# Patient Record
Sex: Female | Born: 1985
Health system: Southern US, Community
[De-identification: ages and names within clinical notes are randomized; demographics above are authoritative.]

## PROBLEM LIST (undated history)

## (undated) DIAGNOSIS — I517 Cardiomegaly: Secondary | ICD-10-CM

## (undated) DIAGNOSIS — I10 Essential (primary) hypertension: Secondary | ICD-10-CM

## (undated) DIAGNOSIS — G8929 Other chronic pain: Secondary | ICD-10-CM

## (undated) DIAGNOSIS — I509 Heart failure, unspecified: Secondary | ICD-10-CM

## (undated) DIAGNOSIS — E282 Polycystic ovarian syndrome: Secondary | ICD-10-CM

## (undated) DIAGNOSIS — N183 Chronic kidney disease, stage 3 unspecified: Secondary | ICD-10-CM

## (undated) DIAGNOSIS — I4891 Unspecified atrial fibrillation: Secondary | ICD-10-CM

## (undated) DIAGNOSIS — E119 Type 2 diabetes mellitus without complications: Secondary | ICD-10-CM

## (undated) DIAGNOSIS — D571 Sickle-cell disease without crisis: Secondary | ICD-10-CM

## (undated) DIAGNOSIS — K769 Liver disease, unspecified: Secondary | ICD-10-CM

## (undated) DIAGNOSIS — G4730 Sleep apnea, unspecified: Secondary | ICD-10-CM

## (undated) DIAGNOSIS — U071 COVID-19: Secondary | ICD-10-CM

## (undated) DIAGNOSIS — I639 Cerebral infarction, unspecified: Secondary | ICD-10-CM

## (undated) DIAGNOSIS — N200 Calculus of kidney: Secondary | ICD-10-CM

## (undated) HISTORY — PX: PORTACATH PLACEMENT: SHX2246

## (undated) HISTORY — PX: CHOLECYSTECTOMY: SHX55

## (undated) HISTORY — PX: PARATHYROIDECTOMY: SHX19

---
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
## 2007-03-27 ENCOUNTER — Ambulatory Visit (HOSPITAL_COMMUNITY): Admission: RE | Admit: 2007-03-27 | Discharge: 2007-03-27 | Payer: Self-pay | Admitting: Obstetrics & Gynecology

## 2007-03-30 ENCOUNTER — Inpatient Hospital Stay (HOSPITAL_COMMUNITY): Admission: AD | Admit: 2007-03-30 | Discharge: 2007-04-05 | Payer: Self-pay | Admitting: Obstetrics

## 2009-12-26 ENCOUNTER — Ambulatory Visit (HOSPITAL_BASED_OUTPATIENT_CLINIC_OR_DEPARTMENT_OTHER): Admission: RE | Admit: 2009-12-26 | Discharge: 2009-12-26 | Payer: Self-pay | Source: Home / Self Care

## 2010-01-03 ENCOUNTER — Ambulatory Visit: Payer: Self-pay | Admitting: Internal Medicine

## 2010-04-05 ENCOUNTER — Encounter: Payer: Self-pay | Admitting: Obstetrics & Gynecology

## 2010-07-28 NOTE — Discharge Summary (Signed)
NAME:  Pamela Hudson, Pamela Hudson NO.:  [id number]   MEDICAL RECORD NO.:  [id number]          PATIENT TYPE:  INP   LOCATION:  9310                          FACILITY:  [facility]   PHYSICIAN:  Charles A. Clearance Coots, M.D.DATE OF BIRTH:  Dec 22, 1985   DATE OF ADMISSION:  03/30/2007  DATE OF DISCHARGE:  04/05/2007                               DISCHARGE SUMMARY   ADMITTING DIAGNOSES:  1. Thirty-eight weeks' gestation.  2. Sickle cell disease.  3. Induction of labor.   DISCHARGE DIAGNOSES:  1. Thirty-eight weeks' gestation.  2. Sickle cell disease.  3. Induction of labor.  4. Status post normal spontaneous vaginal delivery of a viable female      over intact perineum at 2312 on March 31, 2007.  Apgars were 8 at 1 minute and 9 at 5 minutes, weight of 2645 grams.  1. Postpartum hemorrhage from uterine atony, resolved with uterine      massage and medical therapy with Hemabate and intravenous Pitocin.  2. Postpartum left lower lobe lung infiltrate with decreased oxygen      saturation, much improved after intravenous antibiotic therapy.   CONDITION ON DISCHARGE:  Mother and infant discharged home in good  condition on postpartum day #5.   REASON FOR ADMISSION:  A 25 year old G1, estimated date of confinement  of April 13, 2007, history of sickle cell disease, status post CVA in  2004.  The patient has been receiving monthly transfusions at [facility] Hematology Department and has been stable during pregnancy.   PAST MEDICAL HISTORY:   SURGERY:  None.   ILLNESSES:  Sickle cell disease.   MEDICATIONS:  Prenatal vitamins.   ALLERGIES:  NO KNOWN DRUG ALLERGIES   SOCIAL HISTORY:  Single.  Negative tobacco, alcohol or recreational drug  use.   PHYSICAL EXAM:  A well-nourished, well-developed female in no acute  distress.  She was afebrile.  Vital signs were stable.  Lungs were clear  to auscultation bilaterally.  Heart:  Regular rate and rhythm.  Cervix  was fingertip, 80%  effaced and the vertex at a -2 station.   ADMITTING LABORATORY VALUES:  Hemoglobin 9.8, hematocrit 27, white blood  cell count 29,000, platelets 364,000.   HOSPITAL COURSE:  The patient was admitted and cervical ripening was  accomplished overnight followed by Pitocin augmentation of labor.  The  patient progressed to a normal spontaneous vaginal delivery with viable  female on March 31, 2007 at 2312.  The delivery of the placenta was  complicated by uterine atony following delivery of the placenta which  resulted in approximately 1000 mL of blood loss, but responded well to  uterine massage and IM Hemabate along with IV Pitocin.  The patient's  postpartum course was complicated by decreased O2 saturations and chest  x-ray which revealed a left lower lobe lung infiltrate.  continued to  have, a heart rate between 100 and 120 postpartum, but clinically was  very stable.  Hemoglobin had dropped to 9 on postpartum day #1 after  equilibration with the patient receiving 2 units of packed red blood  cells immediately post delivery.  A spiral CT was done to rule out  pulmonary embolism and the spiral CT was negative for pulmonary  embolism.  On postpartum day #2, the patient's hemoglobin was 8 and she  continued to have increased heart rate of 100-120, but she made  excellent progress the remainder of the postpartum course after being  started on antibiotic therapy with Avelox for pulmonary infiltrates.  The patient continued to make good progress postpartum and the O2  saturations on room air after ambulation were in the 98% range.  The  patient was therefore discharged home on postpartum day #5 in good  condition.   DISCHARGE DISPOSITION:   MEDICATIONS:  Continue with Ceftin 500 mg twice a day for 7 days for the  resolution of pulmonary infiltrates.   FOLLOWUP:  The patient has an appointment for followup evaluation at  [facility] with Dr. Frutoso Chase group and she will follow up in  the  office in 2 weeks for a postpartum visit.      Charles A. Clearance Coots, M.D.  Electronically Signed     CAH/MEDQ  D:  04/05/2007  T:  04/05/2007  Job:  [id number]   cc:   [facility] Hematology Oretha Milch MD   [facility] Hematology Ellwood Handler, Lorel Monaco MD   [facility], [place], [place] Particia Nearing MD

## 2010-12-03 LAB — DIFFERENTIAL
Band Neutrophils: 4
Band Neutrophils: 9
Basophils Absolute: 0
Basophils Relative: 0
Blasts: 0
Blasts: 0
Eosinophils Absolute: 0.4
Eosinophils Relative: 0
Lymphocytes Relative: 8 — ABNORMAL LOW
Lymphs Abs: 4.3 — ABNORMAL HIGH
Metamyelocytes Relative: 0
Myelocytes: 0
Neutro Abs: 31.2 — ABNORMAL HIGH
Neutrophils Relative %: 78 — ABNORMAL HIGH
Promyelocytes Absolute: 0
nRBC: 6 — ABNORMAL HIGH

## 2010-12-03 LAB — TYPE AND SCREEN: Antibody Screen: NEGATIVE

## 2010-12-03 LAB — CBC
HCT: 22.9 — ABNORMAL LOW
HCT: 27.3 — ABNORMAL LOW
MCHC: 35
MCHC: 35.1
MCHC: 36
MCHC: 36
MCV: 96.6
MCV: 97.9
MCV: 98.6
MCV: 98.7
Platelets: 237
Platelets: 244
Platelets: 356
Platelets: 364
RDW: 20.1 — ABNORMAL HIGH
RDW: 22.2 — ABNORMAL HIGH
RDW: 22.2 — ABNORMAL HIGH
RDW: 26.6 — ABNORMAL HIGH
WBC: 25.1 — ABNORMAL HIGH

## 2010-12-03 LAB — URINALYSIS, ROUTINE W REFLEX MICROSCOPIC
Bilirubin Urine: NEGATIVE
Ketones, ur: NEGATIVE
Nitrite: NEGATIVE
Protein, ur: NEGATIVE
Specific Gravity, Urine: 1.01
Urobilinogen, UA: 0.2

## 2010-12-03 LAB — HEPATITIS B SURFACE ANTIGEN: Hepatitis B Surface Ag: NEGATIVE

## 2010-12-03 LAB — RPR: RPR Ser Ql: NONREACTIVE

## 2010-12-03 LAB — COMPREHENSIVE METABOLIC PANEL
AST: 58 — ABNORMAL HIGH
CO2: 24
Calcium: 9.1
Creatinine, Ser: 0.92
GFR calc Af Amer: >60
GFR calc non Af Amer: >60

## 2010-12-03 LAB — RETICULOCYTES
RBC.: 2.7 — ABNORMAL LOW
Retic Count, Absolute: 256.5 — ABNORMAL HIGH

## 2010-12-03 LAB — LACTATE DEHYDROGENASE: LDH: 465 — ABNORMAL HIGH

## 2010-12-20 ENCOUNTER — Emergency Department (HOSPITAL_BASED_OUTPATIENT_CLINIC_OR_DEPARTMENT_OTHER)
Admission: EM | Admit: 2010-12-20 | Discharge: 2010-12-20 | Disposition: A | Discharge status: Home / Self Care | Payer: Medicare Other | Attending: Emergency Medicine | Admitting: Emergency Medicine

## 2010-12-20 ENCOUNTER — Encounter: Payer: Self-pay | Admitting: *Deleted

## 2010-12-20 DIAGNOSIS — T63461A Toxic effect of venom of wasps, accidental (unintentional), initial encounter: Secondary | ICD-10-CM | POA: Insufficient documentation

## 2010-12-20 DIAGNOSIS — T63441A Toxic effect of venom of bees, accidental (unintentional), initial encounter: Secondary | ICD-10-CM

## 2010-12-20 DIAGNOSIS — T6391XA Toxic effect of contact with unspecified venomous animal, accidental (unintentional), initial encounter: Secondary | ICD-10-CM | POA: Insufficient documentation

## 2010-12-20 HISTORY — DX: Sickle-cell disease without crisis: D57.1

## 2010-12-20 MED ORDER — CEPHALEXIN 500 MG PO CAPS: 500.0000 mg | ORAL_CAPSULE | Freq: Four times a day (QID) | ORAL | Status: AC

## 2010-12-20 NOTE — ED Notes (Signed)
Pt states she was stung on her left breast by a bee on Friday. Now area is red, tender to touch, swollen and itching.

## 2010-12-20 NOTE — ED Provider Notes (Signed)
Scribed for Geoffery Lyons, MD, the patient was seen in room MH02/MH02 . This chart was scribed by Ellie Lunch. This patient's care was started at 7:42 PM.   CSN: [id number] Arrival date & time: 12/20/2010  7:16 PM  Chief Complaint  Patient presents with  . Insect Bite    (Consider location/radiation/quality/duration/timing/severity/associated sxs/prior treatment) HPI Pamela Hudson is a 25 y.o. female who presents to the Emergency Department complaining of bee sting on her left breast on Friday, two days ago. Pt was in a pumpkin patch when a bee flew down her shirt and stung her. Reports no known allergy to bee stings. Reports she put tobacco on bee sting, hydrocortizone cream, and benadry and reports little improvement. Reports sting is still red, painful, itchy and swollen.   Past Medical History  Diagnosis Date  . Sickle cell anemia     Past Surgical History  Procedure Date  . Cholecystectomy     History reviewed. No pertinent family history.  History  Substance Use Topics  . Smoking status: Never Smoker   . Smokeless tobacco: Not on file  . Alcohol Use: Yes   Review of Systems  Skin:       Red, swelling insect bite on left breast  All other systems reviewed and are negative.   Allergies  Review of patient's allergies indicates no known allergies.  Home Medications   Current Outpatient Rx  Name Route Sig Dispense Refill  . ASPIRIN 81 MG PO TABS Oral Take 81 mg by mouth daily.      . DEFERASIROX 500 MG PO TBSO Oral Take 1,500 mg by mouth daily.      Marland Kitchen FOLIC ACID 400 MCG PO TABS Oral Take 400 mcg by mouth daily.      Marland Kitchen HYDROXYUREA 500 MG PO CAPS Oral Take 1,500 mg by mouth every other day. May take with food to minimize GI side effects.     Marland Kitchen HYDROXYUREA 500 MG PO CAPS Oral Take 2,000 mg by mouth every other day. May take with food to minimize GI side effects.     . MEDROXYPROGESTERONE ACETATE 150 MG/ML IM SUSP Intramuscular Inject 150 mg into the muscle every 3  (three) months.        BP 133/48  Pulse 114  Temp(Src) 99.1 F (37.3 C) (Oral)  Resp 20  Ht 5' (1.524 m)  Wt 224 lb (101.606 kg)  BMI 43.75 kg/m2  SpO2 97%  Physical Exam  Nursing note and vitals reviewed. Constitutional: She is oriented to person, place, and time. She appears well-developed and well-nourished.  HENT:  Head: Normocephalic.  Eyes: EOM are normal.  Neck: Normal range of motion.  Pulmonary/Chest: Effort normal.  Musculoskeletal: Normal range of motion.  Neurological: She is alert and oriented to person, place, and time.  Skin:       2-3 cm round erythematous, warm, and firm on medial left breast  Psychiatric: She has a normal mood and affect.   Procedures (including critical care time)  DIAGNOSTIC STUDIES: Oxygen Saturation is 97% on room air, normal by my interpretation.    ED COURSE / COORDINATION OF CARE: 19:45 EDP discussed possible infection. Plan to treat with abx. Continue taking benadryl.   MDM: allergic rxn vs. Cellulitis.    DISCHARGE MEDICATIONS: New Prescriptions   CEPHALEXIN (KEFLEX) 500 MG CAPSULE    Take 1 capsule (500 mg total) by mouth 4 (four) times daily.    SCRIBE ATTESTATION: I personally performed the services described in this documentation,  which was scribed in my presence. The recorded information has been reviewed and considered.             Geoffery Lyons, MD 12/20/10 2053

## 2012-02-13 DIAGNOSIS — I639 Cerebral infarction, unspecified: Secondary | ICD-10-CM | POA: Diagnosis present

## 2012-05-08 ENCOUNTER — Encounter (HOSPITAL_BASED_OUTPATIENT_CLINIC_OR_DEPARTMENT_OTHER): Payer: Self-pay | Admitting: *Deleted

## 2012-05-08 ENCOUNTER — Emergency Department (HOSPITAL_BASED_OUTPATIENT_CLINIC_OR_DEPARTMENT_OTHER)
Admission: EM | Admit: 2012-05-08 | Discharge: 2012-05-08 | Disposition: A | Discharge status: Home / Self Care | Payer: Medicare Other | Attending: Emergency Medicine | Admitting: Emergency Medicine

## 2012-05-08 DIAGNOSIS — J3489 Other specified disorders of nose and nasal sinuses: Secondary | ICD-10-CM | POA: Insufficient documentation

## 2012-05-08 DIAGNOSIS — Z79899 Other long term (current) drug therapy: Secondary | ICD-10-CM | POA: Insufficient documentation

## 2012-05-08 DIAGNOSIS — H9209 Otalgia, unspecified ear: Secondary | ICD-10-CM | POA: Insufficient documentation

## 2012-05-08 DIAGNOSIS — R1310 Dysphagia, unspecified: Secondary | ICD-10-CM | POA: Insufficient documentation

## 2012-05-08 DIAGNOSIS — Z7982 Long term (current) use of aspirin: Secondary | ICD-10-CM | POA: Insufficient documentation

## 2012-05-08 DIAGNOSIS — R6883 Chills (without fever): Secondary | ICD-10-CM | POA: Insufficient documentation

## 2012-05-08 DIAGNOSIS — J029 Acute pharyngitis, unspecified: Secondary | ICD-10-CM | POA: Insufficient documentation

## 2012-05-08 DIAGNOSIS — J020 Streptococcal pharyngitis: Secondary | ICD-10-CM

## 2012-05-08 MED ORDER — IBUPROFEN 100 MG/5ML PO SUSP
600.0000 mg | Freq: Once | ORAL | Status: AC
  Administered 2012-05-08: 600 mg via ORAL
  Filled 2012-05-08: qty 30

## 2012-05-08 MED ORDER — IBUPROFEN 600 MG PO TABS: 600.0000 mg | ORAL_TABLET | Freq: Four times a day (QID) | ORAL | Status: DC | PRN

## 2012-05-08 MED ORDER — AMOXICILLIN-POT CLAVULANATE 500-125 MG PO TABS: 1.0000 | ORAL_TABLET | Freq: Three times a day (TID) | ORAL | Status: DC

## 2012-05-08 NOTE — ED Notes (Signed)
Pt treated for strep throat on 2/11- states sx are no better

## 2012-05-08 NOTE — ED Provider Notes (Addendum)
History    This chart was scribed for Derwood Kaplan, MD by Donne Anon, ED Scribe. This patient was seen in room MH05/MH05 and the patient's care was started at 2117.   CSN: [id number]  Arrival date & time 05/08/12  2020   First MD Initiated Contact with Patient 05/08/12 2117      Chief Complaint  Patient presents with  . Sore Throat     The history is provided by the patient. No language interpreter was used.   Pamela Hudson is a 27 y.o. female who presents to the Emergency Department complaining of gradual onset, constant, moderate, unchanging moderate sore throat which began 13 days ago. She was seen by her PCP 12 days ago when she was given a shot of an antibiotic, a shot of a steroid, and a prescription for penicillin. She reports associated pain with swallowing, congestion, right ear pain, and chills. She denies nausea, vomiting, fever, drooling or any other pain.  H/o sickle cell anemia.   Past Medical History  Diagnosis Date  . Sickle cell anemia     Past Surgical History  Procedure Laterality Date  . Cholecystectomy    . Portacath placement      No family history on file.  History  Substance Use Topics  . Smoking status: Never Smoker   . Smokeless tobacco: Never Used  . Alcohol Use: Yes     Comment: occasion    Review of Systems  Constitutional: Negative for fever.  HENT: Positive for ear pain, congestion, sore throat and trouble swallowing. Negative for drooling.   Gastrointestinal: Negative for nausea and vomiting.  All other systems reviewed and are negative.    Allergies  Review of patient's allergies indicates no known allergies.  Home Medications   Current Outpatient Rx  Name  Route  Sig  Dispense  Refill  . aspirin 81 MG tablet   Oral   Take 81 mg by mouth daily.           Marland Kitchen deferasirox (EXJADE) 500 MG disintegrating tablet   Oral   Take 1,500 mg by mouth daily.           . folic acid (FOLVITE) 400 MCG tablet   Oral   Take 400  mcg by mouth daily.           . hydroxyurea (HYDREA) 500 MG capsule   Oral   Take 1,500 mg by mouth every other day. May take with food to minimize GI side effects.          . hydroxyurea (HYDREA) 500 MG capsule   Oral   Take 2,000 mg by mouth every other day. May take with food to minimize GI side effects.          . medroxyPROGESTERone (DEPO-PROVERA) 150 MG/ML injection   Intramuscular   Inject 150 mg into the muscle every 3 (three) months.             BP 130/74  Pulse 110  Temp(Src) 99 F (37.2 C) (Oral)  Resp 20  SpO2 100%  Physical Exam  Nursing note and vitals reviewed. Constitutional: She is oriented to person, place, and time. She appears well-developed and well-nourished. No distress.  HENT:  Head: Normocephalic and atraumatic.  Right Ear: External ear normal.  Left Ear: External ear normal.  Mouth/Throat: No oropharyngeal exudate.  No dullness appreciated. No preauricular lymphadenothapy. No postauricular lymphadenopathy. Bilateral tonsillar enlargement with erythema. Anterior cervical lymphadenopathy. Negative trismus.  Eyes: EOM are normal.  Neck: Neck supple. No tracheal deviation present.  Cardiovascular: Normal rate.   Pulmonary/Chest: Effort normal. No respiratory distress.  Musculoskeletal: Normal range of motion.  Neurological: She is alert and oriented to person, place, and time.  Skin: Skin is warm and dry.  Psychiatric: She has a normal mood and affect. Her behavior is normal.    ED Course  Procedures (including critical care time) DIAGNOSTIC STUDIES: Oxygen Saturation is 100% on room air, normal by my interpretation.    COORDINATION OF CARE: 9:35 PM Discussed treatment plan which includes strep test with pt at bedside and pt agreed to plan.     Labs Reviewed - No data to display No results found.   No diagnosis found.    MDM  I personally performed the services described in this documentation, which was scribed in my presence.  The recorded information has been reviewed and is accurate.  Pt comes in with cc of sore thorat. She is s/p decadron, penicillin po course - and the sore throat has persisted. She was dx with strep pharyngitis on 10th Feb. She has no splenomegaly. No malaise, just sore throat. She has no resp compromise and no trismus - no concerns for deep infection of the neck at this time.   If the rapid strep is positive - will treat with augmentin - as she likely has a resistant strain to simple peniciliin.  Derwood Kaplan, MD 05/08/12 2159  Derwood Kaplan, MD 05/08/12 2221

## 2013-01-10 ENCOUNTER — Encounter (HOSPITAL_BASED_OUTPATIENT_CLINIC_OR_DEPARTMENT_OTHER): Payer: Self-pay | Admitting: Emergency Medicine

## 2013-01-10 ENCOUNTER — Emergency Department (HOSPITAL_BASED_OUTPATIENT_CLINIC_OR_DEPARTMENT_OTHER): Payer: Medicare Other

## 2013-01-10 ENCOUNTER — Telehealth (HOSPITAL_BASED_OUTPATIENT_CLINIC_OR_DEPARTMENT_OTHER): Payer: Self-pay | Admitting: *Deleted

## 2013-01-10 ENCOUNTER — Emergency Department (HOSPITAL_BASED_OUTPATIENT_CLINIC_OR_DEPARTMENT_OTHER)
Admission: EM | Admit: 2013-01-10 | Discharge: 2013-01-10 | Disposition: A | Discharge status: Home / Self Care | Payer: Medicare Other | Attending: Emergency Medicine | Admitting: Emergency Medicine

## 2013-01-10 DIAGNOSIS — D571 Sickle-cell disease without crisis: Secondary | ICD-10-CM | POA: Insufficient documentation

## 2013-01-10 DIAGNOSIS — Z792 Long term (current) use of antibiotics: Secondary | ICD-10-CM | POA: Insufficient documentation

## 2013-01-10 DIAGNOSIS — Z79899 Other long term (current) drug therapy: Secondary | ICD-10-CM | POA: Insufficient documentation

## 2013-01-10 DIAGNOSIS — B349 Viral infection, unspecified: Secondary | ICD-10-CM

## 2013-01-10 DIAGNOSIS — R059 Cough, unspecified: Secondary | ICD-10-CM | POA: Insufficient documentation

## 2013-01-10 DIAGNOSIS — B9789 Other viral agents as the cause of diseases classified elsewhere: Secondary | ICD-10-CM | POA: Insufficient documentation

## 2013-01-10 DIAGNOSIS — R05 Cough: Secondary | ICD-10-CM | POA: Insufficient documentation

## 2013-01-10 DIAGNOSIS — Z7982 Long term (current) use of aspirin: Secondary | ICD-10-CM | POA: Insufficient documentation

## 2013-01-10 MED ORDER — HYDROCODONE-HOMATROPINE 5-1.5 MG/5ML PO SYRP: 5.0000 mL | ORAL_SOLUTION | Freq: Four times a day (QID) | ORAL | Status: DC | PRN

## 2013-01-10 MED ORDER — CETIRIZINE-PSEUDOEPHEDRINE ER 5-120 MG PO TB12: 1.0000 | ORAL_TABLET | Freq: Two times a day (BID) | ORAL | Status: DC

## 2013-01-10 NOTE — ED Provider Notes (Signed)
CSN: [id number]     Arrival date & time 01/10/13  2014 History   First MD Initiated Contact with Patient 01/10/13 2031     Chief Complaint  Patient presents with  . Sore Throat   (Consider location/radiation/quality/duration/timing/severity/associated sxs/prior Treatment) HPI Comments: Pt states that she was put on antibiotics and a steroid shot  4 weeks ago for similar symptoms  Patient is a 27 y.o. female presenting with pharyngitis. The history is provided by the patient. No language interpreter was used.  Sore Throat This is a new problem. The current episode started in the past 7 days. The problem occurs constantly. The problem has been unchanged. Associated symptoms include coughing. Pertinent negatives include no fever. The symptoms are aggravated by swallowing.    Past Medical History  Diagnosis Date  . Sickle cell anemia    Past Surgical History  Procedure Laterality Date  . Cholecystectomy    . Portacath placement     History reviewed. No pertinent family history. History  Substance Use Topics  . Smoking status: Never Smoker   . Smokeless tobacco: Never Used  . Alcohol Use: Yes     Comment: occasion   OB History   Grav Para Term Preterm Abortions TAB SAB Ect Mult Living                 Review of Systems  Constitutional: Negative for fever.  Respiratory: Positive for cough.   Cardiovascular: Negative.     Allergies  Review of patient's allergies indicates no known allergies.  Home Medications   Current Outpatient Rx  Name  Route  Sig  Dispense  Refill  . amoxicillin-clavulanate (AUGMENTIN) 500-125 MG per tablet   Oral   Take 1 tablet (500 mg total) by mouth every 8 (eight) hours.   30 tablet   0   . aspirin 81 MG tablet   Oral   Take 81 mg by mouth daily.           Marland Kitchen deferasirox (EXJADE) 500 MG disintegrating tablet   Oral   Take 1,500 mg by mouth daily.           . folic acid (FOLVITE) 400 MCG tablet   Oral   Take 400 mcg by mouth  daily.           . hydroxyurea (HYDREA) 500 MG capsule   Oral   Take 1,500 mg by mouth every other day. May take with food to minimize GI side effects.          . hydroxyurea (HYDREA) 500 MG capsule   Oral   Take 2,000 mg by mouth every other day. May take with food to minimize GI side effects.          Marland Kitchen ibuprofen (ADVIL,MOTRIN) 600 MG tablet   Oral   Take 1 tablet (600 mg total) by mouth every 6 (six) hours as needed for pain.   30 tablet   0   . medroxyPROGESTERone (DEPO-PROVERA) 150 MG/ML injection   Intramuscular   Inject 150 mg into the muscle every 3 (three) months.            BP 117/70  Pulse 100  Temp(Src) 98.8 F (37.1 C) (Oral)  Resp 16  Ht 5\' 1"  (1.549 m)  Wt 225 lb (102.059 kg)  BMI 42.54 kg/m2  SpO2 95% Physical Exam  Nursing note and vitals reviewed. Constitutional: She appears well-developed and well-nourished.  HENT:  Head: Normocephalic and atraumatic.  Right Ear: External ear  normal.  Left Ear: External ear normal.  Mouth/Throat: No posterior oropharyngeal erythema.  Post nasal drainage  Eyes: Conjunctivae and EOM are normal.  Neck: Normal range of motion. Neck supple.  Cardiovascular: Normal rate and regular rhythm.   Pulmonary/Chest: Effort normal and breath sounds normal.    ED Course  Procedures (including critical care time) Labs Review Labs Reviewed  RAPID STREP SCREEN  CULTURE, GROUP A STREP   Imaging Review Dg Chest 2 View  01/10/2013   CLINICAL DATA:  Cough, fever.  EXAM: CHEST  2 VIEW  COMPARISON:  April 07, 2007.  FINDINGS: The heart size and mediastinal contours are within normal limits. Right internal jugular Port-A-Cath is noted with tip in expected position of the SVC. No pneumothorax or pleural effusion is noted. Both lungs are clear. The visualized skeletal structures are unremarkable.  IMPRESSION: No active cardiopulmonary disease.   Electronically Signed   By: Roque Lias M.D.   On: 01/10/2013 21:04    EKG  Interpretation   None       MDM   1. Viral illness    No pneumonia (acute chest) or strep noted:pt has not had fever and is not in pain:will treat symptomatically    Teressa Lower, NP 01/10/13 2132  Teressa Lower, NP 01/10/13 2138

## 2013-01-10 NOTE — ED Provider Notes (Signed)
Medical screening examination/treatment/procedure(s) were performed by non-physician practitioner and as supervising physician I was immediately available for consultation/collaboration.  EKG Interpretation   None         Granvel Proudfoot, MD 01/10/13 2213 

## 2013-01-10 NOTE — ED Notes (Signed)
Patient transported to X-ray 

## 2013-01-10 NOTE — ED Notes (Signed)
rec'd call from [facility] stating that pt's insurance would not cover zyrtec-d only zyrtec. Per Dr Fredderick Phenix, okay to use zyrtec instead once a day with same quantity. Pt and pharmacy made aware.

## 2013-01-10 NOTE — ED Notes (Signed)
Pt c/o sore throat x 3 days, TX for sinus infection x 4 weeks ago

## 2013-01-11 LAB — RAPID STREP SCREEN (MED CTR MEBANE ONLY): Streptococcus, Group A Screen (Direct): NEGATIVE

## 2013-01-12 LAB — CULTURE, GROUP A STREP

## 2013-02-20 ENCOUNTER — Encounter (HOSPITAL_BASED_OUTPATIENT_CLINIC_OR_DEPARTMENT_OTHER): Payer: Self-pay | Admitting: Emergency Medicine

## 2013-02-20 ENCOUNTER — Emergency Department (HOSPITAL_BASED_OUTPATIENT_CLINIC_OR_DEPARTMENT_OTHER): Payer: Medicare Other

## 2013-02-20 ENCOUNTER — Emergency Department (HOSPITAL_BASED_OUTPATIENT_CLINIC_OR_DEPARTMENT_OTHER)
Admission: EM | Admit: 2013-02-20 | Discharge: 2013-02-20 | Disposition: A | Discharge status: Home / Self Care | Payer: Medicare Other | Attending: Emergency Medicine | Admitting: Emergency Medicine

## 2013-02-20 DIAGNOSIS — Z79899 Other long term (current) drug therapy: Secondary | ICD-10-CM | POA: Insufficient documentation

## 2013-02-20 DIAGNOSIS — Z7982 Long term (current) use of aspirin: Secondary | ICD-10-CM | POA: Insufficient documentation

## 2013-02-20 DIAGNOSIS — L509 Urticaria, unspecified: Secondary | ICD-10-CM | POA: Insufficient documentation

## 2013-02-20 DIAGNOSIS — D571 Sickle-cell disease without crisis: Secondary | ICD-10-CM | POA: Insufficient documentation

## 2013-02-20 DIAGNOSIS — M9261 Juvenile osteochondrosis of tarsus, right ankle: Secondary | ICD-10-CM

## 2013-02-20 DIAGNOSIS — M928 Other specified juvenile osteochondrosis: Secondary | ICD-10-CM | POA: Insufficient documentation

## 2013-02-20 MED ORDER — IBUPROFEN 800 MG PO TABS: 800.0000 mg | ORAL_TABLET | Freq: Three times a day (TID) | ORAL | Status: DC

## 2013-02-20 NOTE — ED Notes (Signed)
NP at bedside.

## 2013-02-20 NOTE — ED Notes (Signed)
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 Has had a raised area on her right heel for 5 days. It comes and goes and itches.

## 2013-02-20 NOTE — ED Provider Notes (Signed)
CSN: [id number]     Arrival date & time 02/20/13  1524 History   First MD Initiated Contact with Patient 02/20/13 1531     Chief Complaint  Patient presents with  . Urticaria   (Consider location/radiation/quality/duration/timing/severity/associated sxs/prior Treatment) HPI Comments: Pt states that she has 2 raised area on the back of her right heel that are firm and itchy and have been there for 5 days:pt states that she has been putting rubbing etoh and hydrocortisone to the area:no injury to the area:no drainage or redness noted:pt states that she developed hives on her chest this morning after doing a presentation at school:no sob  The history is provided by the patient. No language interpreter was used.    Past Medical History  Diagnosis Date  . Sickle cell anemia    Past Surgical History  Procedure Laterality Date  . Cholecystectomy    . Portacath placement     No family history on file. History  Substance Use Topics  . Smoking status: Never Smoker   . Smokeless tobacco: Never Used  . Alcohol Use: Yes     Comment: occasion   OB History   Grav Para Term Preterm Abortions TAB SAB Ect Mult Living                 Review of Systems  Constitutional: Negative.   Respiratory: Negative.   Cardiovascular: Negative.     Allergies  Review of patient's allergies indicates no known allergies.  Home Medications   Current Outpatient Rx  Name  Route  Sig  Dispense  Refill  . amoxicillin-clavulanate (AUGMENTIN) 500-125 MG per tablet   Oral   Take 1 tablet (500 mg total) by mouth every 8 (eight) hours.   30 tablet   0   . aspirin 81 MG tablet   Oral   Take 81 mg by mouth daily.           . cetirizine-pseudoephedrine (ZYRTEC-D) 5-120 MG per tablet   Oral   Take 1 tablet by mouth 2 (two) times daily.   30 tablet   0   . deferasirox (EXJADE) 500 MG disintegrating tablet   Oral   Take 1,500 mg by mouth daily.           . folic acid (FOLVITE) 400 MCG tablet  Oral   Take 400 mcg by mouth daily.           Marland Kitchen HYDROcodone-homatropine (HYDROMET) 5-1.5 MG/5ML syrup   Oral   Take 5 mLs by mouth every 6 (six) hours as needed for cough.   120 mL   0   . hydroxyurea (HYDREA) 500 MG capsule   Oral   Take 1,500 mg by mouth every other day. May take with food to minimize GI side effects.          . hydroxyurea (HYDREA) 500 MG capsule   Oral   Take 2,000 mg by mouth every other day. May take with food to minimize GI side effects.          Marland Kitchen ibuprofen (ADVIL,MOTRIN) 600 MG tablet   Oral   Take 1 tablet (600 mg total) by mouth every 6 (six) hours as needed for pain.   30 tablet   0   . medroxyPROGESTERone (DEPO-PROVERA) 150 MG/ML injection   Intramuscular   Inject 150 mg into the muscle every 3 (three) months.            BP 129/72  Pulse 95  Temp(Src) 98.3  F (36.8 C) (Oral)  Resp 20  Ht 5\' 1"  (1.549 m)  Wt 225 lb (102.059 kg)  BMI 42.54 kg/m2  SpO2 95% Physical Exam  Nursing note and vitals reviewed. Constitutional: She is oriented to person, place, and time. She appears well-developed and well-nourished.  Cardiovascular: Normal rate and regular rhythm.   Pulmonary/Chest: Effort normal and breath sounds normal.  Musculoskeletal: Normal range of motion.  Neurological: She is alert and oriented to person, place, and time.  Skin:  Pt has 2 small dime sized firm nodules to the posterior aspect of the right heel:no redness or swelling noted:Pt has localized area of rash to the right chest    ED Course  Procedures (including critical care time) Labs Review Labs Reviewed - No data to display Imaging Review Dg Ankle 2 Views Right  02/20/2013   CLINICAL DATA:  Soft tissue prominence posterior ankle region.  EXAM: RIGHT ANKLE - 2 VIEW  COMPARISON:  December 25, 2006  FINDINGS: Frontal and lateral views were obtained. There is calcification in the lateral malleolar region which may represent residua of old trauma. No evidence suggesting  acute fracture. No joint effusion. Ankle mortise appears intact. No erosive change. No soft tissue mass or calcification seen.  IMPRESSION: No demonstrable soft tissue mass. Question old trauma lateral malleolar region. No acute fracture. Mortise intact. No joint effusion.   Electronically Signed   By: Bretta Bang M.D.   On: 02/20/2013 15:56    EKG Interpretation   None       MDM   1. Hives   2. Haglund's deformity, right    Will treat for a tendonitis:hives are localized:pt can take otc medications    Teressa Lower, NP 02/20/13 1610

## 2013-02-20 NOTE — ED Provider Notes (Signed)
Medical screening examination/treatment/procedure(s) were performed by non-physician practitioner and as supervising physician I was immediately available for consultation/collaboration.     Geoffery Lyons, MD 02/20/13 [phone]

## 2013-05-12 ENCOUNTER — Encounter (HOSPITAL_BASED_OUTPATIENT_CLINIC_OR_DEPARTMENT_OTHER): Payer: Self-pay | Admitting: Emergency Medicine

## 2013-05-12 ENCOUNTER — Emergency Department (HOSPITAL_BASED_OUTPATIENT_CLINIC_OR_DEPARTMENT_OTHER)
Admission: EM | Admit: 2013-05-12 | Discharge: 2013-05-13 | Disposition: A | Discharge status: Home / Self Care | Payer: Medicare Other | Attending: Emergency Medicine | Admitting: Emergency Medicine

## 2013-05-12 ENCOUNTER — Emergency Department (HOSPITAL_BASED_OUTPATIENT_CLINIC_OR_DEPARTMENT_OTHER): Payer: Medicare Other

## 2013-05-12 DIAGNOSIS — N760 Acute vaginitis: Secondary | ICD-10-CM | POA: Insufficient documentation

## 2013-05-12 DIAGNOSIS — B9689 Other specified bacterial agents as the cause of diseases classified elsewhere: Secondary | ICD-10-CM | POA: Insufficient documentation

## 2013-05-12 DIAGNOSIS — Z9889 Other specified postprocedural states: Secondary | ICD-10-CM | POA: Insufficient documentation

## 2013-05-12 DIAGNOSIS — R109 Unspecified abdominal pain: Secondary | ICD-10-CM

## 2013-05-12 DIAGNOSIS — Z9089 Acquired absence of other organs: Secondary | ICD-10-CM | POA: Insufficient documentation

## 2013-05-12 DIAGNOSIS — D571 Sickle-cell disease without crisis: Secondary | ICD-10-CM | POA: Insufficient documentation

## 2013-05-12 DIAGNOSIS — Z792 Long term (current) use of antibiotics: Secondary | ICD-10-CM | POA: Insufficient documentation

## 2013-05-12 DIAGNOSIS — R197 Diarrhea, unspecified: Secondary | ICD-10-CM | POA: Insufficient documentation

## 2013-05-12 DIAGNOSIS — Z791 Long term (current) use of non-steroidal anti-inflammatories (NSAID): Secondary | ICD-10-CM | POA: Insufficient documentation

## 2013-05-12 DIAGNOSIS — Z79899 Other long term (current) drug therapy: Secondary | ICD-10-CM | POA: Insufficient documentation

## 2013-05-12 DIAGNOSIS — Z7982 Long term (current) use of aspirin: Secondary | ICD-10-CM | POA: Insufficient documentation

## 2013-05-12 DIAGNOSIS — A499 Bacterial infection, unspecified: Secondary | ICD-10-CM | POA: Insufficient documentation

## 2013-05-12 LAB — URINALYSIS, ROUTINE W REFLEX MICROSCOPIC
GLUCOSE, UA: NEGATIVE mg/dL
HGB URINE DIPSTICK: NEGATIVE
KETONES UR: NEGATIVE mg/dL
Nitrite: NEGATIVE
Protein, ur: NEGATIVE mg/dL
SPECIFIC GRAVITY, URINE: 1.016 (ref 1.005–1.030)
Urobilinogen, UA: 1 mg/dL (ref 0.0–1.0)
pH: 5.5 (ref 5.0–8.0)

## 2013-05-12 LAB — URINE MICROSCOPIC-ADD ON

## 2013-05-12 LAB — COMPREHENSIVE METABOLIC PANEL
ALT: 112 U/L — ABNORMAL HIGH (ref 0–35)
AST: 90 U/L — ABNORMAL HIGH (ref 0–37)
Albumin: 3.7 g/dL (ref 3.5–5.2)
Alkaline Phosphatase: 165 U/L — ABNORMAL HIGH (ref 39–117)
BILIRUBIN TOTAL: 9.3 mg/dL — AB (ref 0.3–1.2)
BUN: 9 mg/dL (ref 6–23)
CALCIUM: 11.1 mg/dL — AB (ref 8.4–10.5)
CHLORIDE: 102 meq/L (ref 96–112)
CO2: 23 meq/L (ref 19–32)
CREATININE: 0.6 mg/dL (ref 0.50–1.10)
GLUCOSE: 84 mg/dL (ref 70–99)
Potassium: 3.9 mEq/L (ref 3.7–5.3)
Sodium: 136 mEq/L — ABNORMAL LOW (ref 137–147)
Total Protein: 8.2 g/dL (ref 6.0–8.3)

## 2013-05-12 LAB — WET PREP, GENITAL
Trich, Wet Prep: NONE SEEN
YEAST WET PREP: NONE SEEN

## 2013-05-12 LAB — CBC WITH DIFFERENTIAL/PLATELET
BAND NEUTROPHILS: 1 % (ref 0–10)
Basophils Absolute: 0 10*3/uL (ref 0.0–0.1)
Basophils Relative: 0 % (ref 0–1)
EOS ABS: 0.3 10*3/uL (ref 0.0–0.7)
Eosinophils Relative: 1 % (ref 0–5)
HEMATOCRIT: 24 % — AB (ref 36.0–46.0)
Hemoglobin: 8.3 g/dL — ABNORMAL LOW (ref 12.0–15.0)
LYMPHS ABS: 12.7 10*3/uL — AB (ref 0.7–4.0)
LYMPHS PCT: 38 % (ref 12–46)
MCH: 34.4 pg — AB (ref 26.0–34.0)
MCHC: 34.6 g/dL (ref 30.0–36.0)
MCV: 99.6 fL (ref 78.0–100.0)
MONO ABS: 2.7 10*3/uL — AB (ref 0.1–1.0)
Monocytes Relative: 8 % (ref 3–12)
NEUTROS ABS: 17.6 10*3/uL — AB (ref 1.7–7.7)
Neutrophils Relative %: 52 % (ref 43–77)
Platelets: 367 10*3/uL (ref 150–400)
RBC: 2.41 MIL/uL — ABNORMAL LOW (ref 3.87–5.11)
RDW: 21 % — AB (ref 11.5–15.5)
WBC: 33.3 10*3/uL — ABNORMAL HIGH (ref 4.0–10.5)

## 2013-05-12 LAB — LIPASE, BLOOD: Lipase: 25 U/L (ref 11–59)

## 2013-05-12 NOTE — ED Notes (Signed)
Reports being on her period.  States cramping is excessive, in her diffuse lower abdomen, and c/o diarrhea along with it.  Denies fever.

## 2013-05-12 NOTE — ED Provider Notes (Signed)
CSN: [id number]     Arrival date & time 05/12/13  2039 History   First MD Initiated Contact with Patient 05/12/13 2044     Chief Complaint  Patient presents with  . Abdominal Pain     (Consider location/radiation/quality/duration/timing/severity/associated sxs/prior Treatment) Patient is a 28 y.o. female presenting with abdominal pain. The history is provided by the patient.  Abdominal Pain Pain location:  LLQ, RLQ and suprapubic Pain quality: dull and sharp   Pain radiates to:  Does not radiate Pain severity:  Mild Onset quality:  Gradual Timing:  Constant Progression:  Worsening Chronicity:  New Context: not sick contacts and not suspicious food intake   Relieved by:  Nothing Worsened by:  Nothing tried Ineffective treatments:  None tried Associated symptoms: diarrhea and vaginal bleeding   Associated symptoms: no anorexia, no chest pain, no chills, no cough, no dysuria, no fatigue, no fever, no flatus, no hematuria, no nausea, no shortness of breath, no sore throat, no vaginal discharge and no vomiting   Risk factors: not pregnant    Pamela Hudson is a 28 y.o. female who presents to the ED with lower abdominal pain that started 2 days ago and her period started yesterday. She reports that the pain is different from her usual cramps. She has not taken anything for pain. She uses Depo Provera for birth control. Current sex partner x 5 years, uses condoms most of the time. No history of STI's.   Past Medical History  Diagnosis Date  . Sickle cell anemia    Past Surgical History  Procedure Laterality Date  . Cholecystectomy    . Portacath placement     No family history on file. History  Substance Use Topics  . Smoking status: Never Smoker   . Smokeless tobacco: Never Used  . Alcohol Use: Yes     Comment: occasion   OB History   Grav Para Term Preterm Abortions TAB SAB Ect Mult Living                 Review of Systems  Constitutional: Negative for fever, chills and  fatigue.  HENT: Negative.  Negative for sore throat.   Eyes: Negative for pain and visual disturbance.  Respiratory: Negative for cough and shortness of breath.   Cardiovascular: Negative for chest pain.  Gastrointestinal: Positive for abdominal pain and diarrhea. Negative for nausea, vomiting, anorexia and flatus.  Genitourinary: Positive for vaginal bleeding. Negative for dysuria, hematuria and vaginal discharge.  Musculoskeletal: Negative for back pain.  Skin: Negative for rash.  Neurological: Negative for light-headedness and headaches.  Psychiatric/Behavioral: Negative for confusion. The patient is not nervous/anxious.       Allergies  Review of patient's allergies indicates no known allergies.  Home Medications   Current Outpatient Rx  Name  Route  Sig  Dispense  Refill  . amoxicillin-clavulanate (AUGMENTIN) 500-125 MG per tablet   Oral   Take 1 tablet (500 mg total) by mouth every 8 (eight) hours.   30 tablet   0   . aspirin 81 MG tablet   Oral   Take 81 mg by mouth daily.           . cetirizine-pseudoephedrine (ZYRTEC-D) 5-120 MG per tablet   Oral   Take 1 tablet by mouth 2 (two) times daily.   30 tablet   0   . deferasirox (EXJADE) 500 MG disintegrating tablet   Oral   Take 1,500 mg by mouth daily.           Marland Kitchen  folic acid (FOLVITE) 400 MCG tablet   Oral   Take 400 mcg by mouth daily.           Marland Kitchen HYDROcodone-homatropine (HYDROMET) 5-1.5 MG/5ML syrup   Oral   Take 5 mLs by mouth every 6 (six) hours as needed for cough.   120 mL   0   . hydroxyurea (HYDREA) 500 MG capsule   Oral   Take 1,500 mg by mouth every other day. May take with food to minimize GI side effects.          . hydroxyurea (HYDREA) 500 MG capsule   Oral   Take 2,000 mg by mouth every other day. May take with food to minimize GI side effects.          Marland Kitchen ibuprofen (ADVIL,MOTRIN) 600 MG tablet   Oral   Take 1 tablet (600 mg total) by mouth every 6 (six) hours as needed for  pain.   30 tablet   0   . ibuprofen (ADVIL,MOTRIN) 800 MG tablet   Oral   Take 1 tablet (800 mg total) by mouth 3 (three) times daily.   21 tablet   0   . medroxyPROGESTERone (DEPO-PROVERA) 150 MG/ML injection   Intramuscular   Inject 150 mg into the muscle every 3 (three) months.            BP 123/72  Pulse 90  Temp(Src) 99 F (37.2 C) (Oral)  Resp 16  Ht 5' (1.524 m)  Wt 225 lb (102.059 kg)  BMI 43.94 kg/m2  SpO2 95%  LMP 05/11/2013 Physical Exam  Nursing note and vitals reviewed. Constitutional: She is oriented to person, place, and time. She appears well-developed and well-nourished.  HENT:  Head: Normocephalic and atraumatic.  Eyes: EOM are normal.  Neck: Neck supple.  Cardiovascular: Normal rate.   Pulmonary/Chest: Effort normal.  Abdominal: Soft. Bowel sounds are normal. There is tenderness in the right lower quadrant, suprapubic area and left lower quadrant. There is no CVA tenderness.  Genitourinary:  External genitalia without lesions, moderate blood vaginal vault. Positive CMT, bilateral adnexal tenderness. Unable to palpate uterus due to patient habitus.  Musculoskeletal: Normal range of motion.  Neurological: She is alert and oriented to person, place, and time. No cranial nerve deficit.  Skin: Skin is warm and dry.  Psychiatric: She has a normal mood and affect. Her behavior is normal.   Results for orders placed during the hospital encounter of 05/12/13 (from the past 24 hour(s))  CBC WITH DIFFERENTIAL     Status: Abnormal   Collection Time    05/12/13  9:20 PM      Result Value Ref Range   WBC 33.3 (*) 4.0 - 10.5 K/uL   RBC 2.41 (*) 3.87 - 5.11 MIL/uL   Hemoglobin 8.3 (*) 12.0 - 15.0 g/dL   HCT 65.7 (*) 84.6 - 96.2 %   MCV 99.6  78.0 - 100.0 fL   MCH 34.4 (*) 26.0 - 34.0 pg   MCHC 34.6  30.0 - 36.0 g/dL   RDW 95.2 (*) 84.1 - 32.4 %   Platelets 367  150 - 400 K/uL   Neutrophils Relative % 52  43 - 77 %   Lymphocytes Relative 38  12 - 46 %    Monocytes Relative 8  3 - 12 %   Eosinophils Relative 1  0 - 5 %   Basophils Relative 0  0 - 1 %   Band Neutrophils 1  0 - 10 %  Neutro Abs 17.6 (*) 1.7 - 7.7 K/uL   Lymphs Abs 12.7 (*) 0.7 - 4.0 K/uL   Monocytes Absolute 2.7 (*) 0.1 - 1.0 K/uL   Eosinophils Absolute 0.3  0.0 - 0.7 K/uL   Basophils Absolute 0.0  0.0 - 0.1 K/uL   RBC Morphology SICKLE CELLS     WBC Morphology WHITE COUNT CONFIRMED ON SMEAR    COMPREHENSIVE METABOLIC PANEL     Status: Abnormal   Collection Time    05/12/13  9:20 PM      Result Value Ref Range   Sodium 136 (*) 137 - 147 mEq/L   Potassium 3.9  3.7 - 5.3 mEq/L   Chloride 102  96 - 112 mEq/L   CO2 23  19 - 32 mEq/L   Glucose, Bld 84  70 - 99 mg/dL   BUN 9  6 - 23 mg/dL   Creatinine, Ser 1.61  0.50 - 1.10 mg/dL   Calcium 09.6 (*) 8.4 - 10.5 mg/dL   Total Protein 8.2  6.0 - 8.3 g/dL   Albumin 3.7  3.5 - 5.2 g/dL   AST 90 (*) 0 - 37 U/L   ALT 112 (*) 0 - 35 U/L   Alkaline Phosphatase 165 (*) 39 - 117 U/L   Total Bilirubin 9.3 (*) 0.3 - 1.2 mg/dL   GFR calc non Af Amer >90  >90 mL/min   GFR calc Af Amer >90  >90 mL/min  LIPASE, BLOOD     Status: None   Collection Time    05/12/13  9:20 PM      Result Value Ref Range   Lipase 25  11 - 59 U/L  WET PREP, GENITAL     Status: Abnormal   Collection Time    05/12/13  9:40 PM      Result Value Ref Range   Yeast Wet Prep HPF POC NONE SEEN  NONE SEEN   Trich, Wet Prep NONE SEEN  NONE SEEN   Clue Cells Wet Prep HPF POC MANY (*) NONE SEEN   WBC, Wet Prep HPF POC FEW (*) NONE SEEN  URINALYSIS, ROUTINE W REFLEX MICROSCOPIC     Status: Abnormal   Collection Time    05/12/13  9:45 PM      Result Value Ref Range   Color, Urine ORANGE (*) YELLOW   APPearance CLEAR  CLEAR   Specific Gravity, Urine 1.016  1.005 - 1.030   pH 5.5  5.0 - 8.0   Glucose, UA NEGATIVE  NEGATIVE mg/dL   Hgb urine dipstick NEGATIVE  NEGATIVE   Bilirubin Urine SMALL (*) NEGATIVE   Ketones, ur NEGATIVE  NEGATIVE mg/dL   Protein, ur  NEGATIVE  NEGATIVE mg/dL   Urobilinogen, UA 1.0  0.0 - 1.0 mg/dL   Nitrite NEGATIVE  NEGATIVE   Leukocytes, UA TRACE (*) NEGATIVE  URINE MICROSCOPIC-ADD ON     Status: Abnormal   Collection Time    05/12/13  9:45 PM      Result Value Ref Range   Squamous Epithelial / LPF RARE  RARE   WBC, UA 0-2  <3 WBC/hpf   RBC / HPF 0-2  <3 RBC/hpf   Bacteria, UA FEW (*) RARE   Ct Abdomen Pelvis Wo Contrast  05/12/2013   CLINICAL DATA:  Lower abdominal pain, menstrual cramping, leukocytosis. Sickle cell disease. Prior cholecystectomy.  EXAM: CT ABDOMEN AND PELVIS WITHOUT CONTRAST  TECHNIQUE: Multidetector CT imaging of the abdomen and pelvis was performed following the standard protocol  without intravenous contrast.  COMPARISON:  CT chest abdomen pelvis dated 01/24/2013  FINDINGS: Lung bases are essentially clear.  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  Spleen, pancreas, and adrenal glands are within normal limits.  Status post cholecystectomy. No intrahepatic or extrahepatic ductal dilatation.  3 mm nonobstructing right lower pole renal calculus. Left kidney is within normal limits. No hydronephrosis.  No evidence of bowel obstruction.  Normal appendix.  No evidence of abdominal aortic aneurysm.  No abdominopelvic ascites.  No suspicious abdominopelvic lymphadenopathy.  Uterus and bilateral ovaries are unremarkable.  No ureteral or bladder calculi.  Bladder is unremarkable.  Calcified pelvic phleboliths.  Moderate fat containing left paramidline ventral hernia (series 2/image 56).  Visualized osseous structures are within normal limits.  IMPRESSION: No evidence of bowel obstruction.  Normal appendix.  3 mm nonobstructing right lower pole renal calculus. No ureteral or bladder calculi. No hydronephrosis.  Prior cholecystectomy.  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   Electronically Signed   By: Charline Bills M.D.   On: 05/12/2013  23:58    ED Course: Dr. Fayrene Fearing in to discuss lab results with the patient. The patient states that her WBC is always 30 thousand. She states she goes to [facility] for her sickle cell and they do profusion. She is scheduled to go next week.   Procedures   MDM  28 y.o. female with abdominal pain that is cramping but worse than her usual cramps with menses. Stable for discharge with soft, non surgical abdomen. Will treat her BV and pain. She will follow up as scheduled with her doctor at [facility]. She will return here as needed. I have reviewed this patient's vital signs, nurses notes, appropriate labs and imaging.  I have discussed findings and plan of care with the patient and she voices understanding.    Medication List    TAKE these medications       metroNIDAZOLE 500 MG tablet  Commonly known as:  FLAGYL  Take 1 tablet (500 mg total) by mouth 2 (two) times daily.     oxyCODONE-acetaminophen 5-325 MG per tablet  Commonly known as:  ROXICET  Take 1 tablet by mouth every 4 (four) hours as needed for severe pain.      ASK your doctor about these medications       amoxicillin-clavulanate 500-125 MG per tablet  Commonly known as:  AUGMENTIN  Take 1 tablet (500 mg total) by mouth every 8 (eight) hours.     aspirin 81 MG tablet  Take 81 mg by mouth daily.     cetirizine-pseudoephedrine 5-120 MG per tablet  Commonly known as:  ZYRTEC-D  Take 1 tablet by mouth 2 (two) times daily.     deferasirox 500 MG disintegrating tablet  Commonly known as:  EXJADE  Take 1,500 mg by mouth daily.     folic acid 400 MCG tablet  Commonly known as:  FOLVITE  Take 400 mcg by mouth daily.     HYDROcodone-homatropine 5-1.5 MG/5ML syrup  Commonly known as:  HYDROMET  Take 5 mLs by mouth every 6 (six) hours as needed for cough.     hydroxyurea 500 MG capsule  Commonly known as:  HYDREA  Take 1,500 mg by mouth every other day. May take with food to minimize GI side effects.     hydroxyurea 500 MG capsule   Commonly known as:  HYDREA  Take 2,000 mg by mouth every other day. May take with food to  minimize GI side effects.     ibuprofen 600 MG tablet  Commonly known as:  ADVIL,MOTRIN  Take 1 tablet (600 mg total) by mouth every 6 (six) hours as needed for pain.     ibuprofen 800 MG tablet  Commonly known as:  ADVIL,MOTRIN  Take 1 tablet (800 mg total) by mouth 3 (three) times daily.     medroxyPROGESTERone 150 MG/ML injection  Commonly known as:  DEPO-PROVERA  Inject 150 mg into the muscle every 3 (three) months.           [facility] Orlene OchM Isaiah Torok, [place]NP 05/13/13 [phone]

## 2013-05-12 NOTE — ED Provider Notes (Signed)
Pt seen and examined.  History reviewed.  Intermittant bilateral lower abdominal pain.  WBC 33K (consistent with pts WBC over several years).  Bili 9.3 and pt with icterus (pt states icterus over 5 years, and bilirubin "always" high.  H/o cholecystectomy.  H/o sickle cell, missed exchange tranfusion due to snow last week, scheduled Tuesday.  Abdomen is benign.  Aggressive palpation of lower abdomen ilicits NO tenderness.  CT reviewed, and without gross abnormalities, Radiologist report pending.  Plan:  If CT report NAD, I feel DC with follow up is appropriate.  Rolland PorterMark Aryonna Gunnerson, MD 05/12/13 [phone]

## 2013-05-13 LAB — RETICULOCYTES
RBC.: 2.42 MIL/uL — ABNORMAL LOW (ref 3.87–5.11)
RETIC CT PCT: 23 % — AB (ref 0.4–3.1)
Retic Count, Absolute: 556.6 10*3/uL — ABNORMAL HIGH (ref 19.0–186.0)

## 2013-05-13 MED ORDER — METRONIDAZOLE 500 MG PO TABS: 500.0000 mg | ORAL_TABLET | Freq: Two times a day (BID) | ORAL | Status: DC

## 2013-05-13 MED ORDER — OXYCODONE-ACETAMINOPHEN 5-325 MG PO TABS: 1.0000 | ORAL_TABLET | ORAL | Status: DC | PRN

## 2013-05-13 NOTE — Discharge Instructions (Signed)
It is important that you follow up with your doctor at [facility]. Take your lab results to your visit. Return here as needed for worsening symptoms.   Abdominal Pain, Women Abdominal (stomach, pelvic, or belly) pain can be caused by many things. It is important to tell your doctor:  The location of the pain.  Does it come and go or is it present all the time?  Are there things that start the pain (eating certain foods, exercise)?  Are there other symptoms associated with the pain (fever, nausea, vomiting, diarrhea)? All of this is helpful to know when trying to find the cause of the pain. CAUSES   Stomach: virus or bacteria infection, or ulcer.  Intestine: appendicitis (inflamed appendix), regional ileitis (Crohn's disease), ulcerative colitis (inflamed colon), irritable bowel syndrome, diverticulitis (inflamed diverticulum of the colon), or cancer of the stomach or intestine.  Gallbladder disease or stones in the gallbladder.  Kidney disease, kidney stones, or infection.  Pancreas infection or cancer.  Fibromyalgia (pain disorder).  Diseases of the female organs:  Uterus: fibroid (non-cancerous) tumors or infection.  Fallopian tubes: infection or tubal pregnancy.  Ovary: cysts or tumors.  Pelvic adhesions (scar tissue).  Endometriosis (uterus lining tissue growing in the pelvis and on the pelvic organs).  Pelvic congestion syndrome (female organs filling up with blood just before the menstrual period).  Pain with the menstrual period.  Pain with ovulation (producing an egg).  Pain with an IUD (intrauterine device, birth control) in the uterus.  Cancer of the female organs.  Functional pain (pain not caused by a disease, may improve without treatment).  Psychological pain.  Depression. DIAGNOSIS  Your doctor will decide the seriousness of your pain by doing an examination.  Blood tests.  X-rays.  Ultrasound.  CT scan (computed tomography, special type of  X-ray).  MRI (magnetic resonance imaging).  Cultures, for infection.  Barium enema (dye inserted in the large intestine, to better view it with X-rays).  Colonoscopy (looking in intestine with a lighted tube).  Laparoscopy (minor surgery, looking in abdomen with a lighted tube).  Major abdominal exploratory surgery (looking in abdomen with a large incision). TREATMENT  The treatment will depend on the cause of the pain.   Many cases can be observed and treated at home.  Over-the-counter medicines recommended by your caregiver.  Prescription medicine.  Antibiotics, for infection.  Birth control pills, for painful periods or for ovulation pain.  Hormone treatment, for endometriosis.  Nerve blocking injections.  Physical therapy.  Antidepressants.  Counseling with a psychologist or psychiatrist.  Minor or major surgery. HOME CARE INSTRUCTIONS   Do not take laxatives, unless directed by your caregiver.  Take over-the-counter pain medicine only if ordered by your caregiver. Do not take aspirin because it can cause an upset stomach or bleeding.  Try a clear liquid diet (broth or water) as ordered by your caregiver. Slowly move to a bland diet, as tolerated, if the pain is related to the stomach or intestine.  Have a thermometer and take your temperature several times a day, and record it.  Bed rest and sleep, if it helps the pain.  Avoid sexual intercourse, if it causes pain.  Avoid stressful situations.  Keep your follow-up appointments and tests, as your caregiver orders.  If the pain does not go away with medicine or surgery, you may try:  Acupuncture.  Relaxation exercises (yoga, meditation).  Group therapy.  Counseling. SEEK MEDICAL CARE IF:   You notice certain foods cause stomach  pain.  Your home care treatment is not helping your pain.  You need stronger pain medicine.  You want your IUD removed.  You feel faint or lightheaded.  You  develop nausea and vomiting.  You develop a rash.  You are having side effects or an allergy to your medicine. SEEK IMMEDIATE MEDICAL CARE IF:   Your pain does not go away or gets worse.  You have a fever.  Your pain is felt only in portions of the abdomen. The right side could possibly be appendicitis. The left lower portion of the abdomen could be colitis or diverticulitis.  You are passing blood in your stools (bright red or black tarry stools, with or without vomiting).  You have blood in your urine.  You develop chills, with or without a fever.  You pass out. MAKE SURE YOU:   Understand these instructions.  Will watch your condition.  Will get help right away if you are not doing well or get worse. Document Released: 12/27/2006 Document Revised: 05/24/2011 Document Reviewed: 01/16/2009 [facility] Center[facility] Patient Information 2014 [place], [place].

## 2013-05-14 LAB — PATHOLOGIST SMEAR REVIEW

## 2013-05-14 LAB — GC/CHLAMYDIA PROBE AMP
CT PROBE, AMP APTIMA: NEGATIVE
GC PROBE AMP APTIMA: NEGATIVE

## 2013-05-15 ENCOUNTER — Emergency Department (HOSPITAL_BASED_OUTPATIENT_CLINIC_OR_DEPARTMENT_OTHER): Payer: Medicare Other

## 2013-05-15 ENCOUNTER — Emergency Department (HOSPITAL_BASED_OUTPATIENT_CLINIC_OR_DEPARTMENT_OTHER)
Admission: EM | Admit: 2013-05-15 | Discharge: 2013-05-15 | Disposition: A | Discharge status: Home / Self Care | Payer: Medicare Other | Attending: Emergency Medicine | Admitting: Emergency Medicine

## 2013-05-15 ENCOUNTER — Encounter (HOSPITAL_BASED_OUTPATIENT_CLINIC_OR_DEPARTMENT_OTHER): Payer: Self-pay | Admitting: Emergency Medicine

## 2013-05-15 DIAGNOSIS — Z7982 Long term (current) use of aspirin: Secondary | ICD-10-CM | POA: Insufficient documentation

## 2013-05-15 DIAGNOSIS — R079 Chest pain, unspecified: Secondary | ICD-10-CM

## 2013-05-15 DIAGNOSIS — F17200 Nicotine dependence, unspecified, uncomplicated: Secondary | ICD-10-CM | POA: Insufficient documentation

## 2013-05-15 DIAGNOSIS — Z791 Long term (current) use of non-steroidal anti-inflammatories (NSAID): Secondary | ICD-10-CM | POA: Insufficient documentation

## 2013-05-15 DIAGNOSIS — D571 Sickle-cell disease without crisis: Secondary | ICD-10-CM | POA: Insufficient documentation

## 2013-05-15 DIAGNOSIS — R0789 Other chest pain: Secondary | ICD-10-CM | POA: Insufficient documentation

## 2013-05-15 DIAGNOSIS — Z792 Long term (current) use of antibiotics: Secondary | ICD-10-CM | POA: Insufficient documentation

## 2013-05-15 DIAGNOSIS — Z79899 Other long term (current) drug therapy: Secondary | ICD-10-CM | POA: Insufficient documentation

## 2013-05-15 LAB — CBC WITH DIFFERENTIAL/PLATELET
BASOS PCT: 0 % (ref 0–1)
Basophils Absolute: 0 10*3/uL (ref 0.0–0.1)
EOS PCT: 3 % (ref 0–5)
Eosinophils Absolute: 1 10*3/uL — ABNORMAL HIGH (ref 0.0–0.7)
HCT: 22.7 % — ABNORMAL LOW (ref 36.0–46.0)
Hemoglobin: 8 g/dL — ABNORMAL LOW (ref 12.0–15.0)
LYMPHS ABS: 7.4 10*3/uL — AB (ref 0.7–4.0)
Lymphocytes Relative: 22 % (ref 12–46)
MCH: 35.1 pg — AB (ref 26.0–34.0)
MCHC: 35.2 g/dL (ref 30.0–36.0)
MCV: 99.6 fL (ref 78.0–100.0)
Monocytes Absolute: 4 10*3/uL — ABNORMAL HIGH (ref 0.1–1.0)
Monocytes Relative: 12 % (ref 3–12)
NEUTROS ABS: 21.3 10*3/uL — AB (ref 1.7–7.7)
Neutrophils Relative %: 63 % (ref 43–77)
PLATELETS: 346 10*3/uL (ref 150–400)
RBC: 2.28 MIL/uL — AB (ref 3.87–5.11)
RDW: 21.4 % — ABNORMAL HIGH (ref 11.5–15.5)
WBC: 33.7 10*3/uL — ABNORMAL HIGH (ref 4.0–10.5)
nRBC: 5 /100 WBC — ABNORMAL HIGH

## 2013-05-15 LAB — URINALYSIS, ROUTINE W REFLEX MICROSCOPIC
BILIRUBIN URINE: NEGATIVE
Glucose, UA: NEGATIVE mg/dL
KETONES UR: NEGATIVE mg/dL
Leukocytes, UA: NEGATIVE
NITRITE: NEGATIVE
Protein, ur: NEGATIVE mg/dL
Specific Gravity, Urine: 1.015 (ref 1.005–1.030)
UROBILINOGEN UA: 0.2 mg/dL (ref 0.0–1.0)
pH: 5.5 (ref 5.0–8.0)

## 2013-05-15 LAB — COMPREHENSIVE METABOLIC PANEL
ALT: 85 U/L — ABNORMAL HIGH (ref 0–35)
AST: 73 U/L — ABNORMAL HIGH (ref 0–37)
Albumin: 3.9 g/dL (ref 3.5–5.2)
Alkaline Phosphatase: 159 U/L — ABNORMAL HIGH (ref 39–117)
BUN: 6 mg/dL (ref 6–23)
CALCIUM: 11.3 mg/dL — AB (ref 8.4–10.5)
CO2: 25 meq/L (ref 19–32)
CREATININE: 0.6 mg/dL (ref 0.50–1.10)
Chloride: 104 mEq/L (ref 96–112)
GLUCOSE: 91 mg/dL (ref 70–99)
Potassium: 4.7 mEq/L (ref 3.7–5.3)
Sodium: 138 mEq/L (ref 137–147)
Total Bilirubin: 6.1 mg/dL — ABNORMAL HIGH (ref 0.3–1.2)
Total Protein: 8.5 g/dL — ABNORMAL HIGH (ref 6.0–8.3)

## 2013-05-15 LAB — URINE MICROSCOPIC-ADD ON

## 2013-05-15 LAB — RETICULOCYTES
RBC.: 2.34 MIL/uL — AB (ref 3.87–5.11)
Retic Ct Pct: >23 % — ABNORMAL HIGH (ref 0.4–3.1)

## 2013-05-15 MED ORDER — HYDROMORPHONE HCL PF 1 MG/ML IJ SOLN
1.0000 mg | Freq: Once | INTRAMUSCULAR | Status: AC
  Administered 2013-05-15: 1 mg via INTRAVENOUS
  Filled 2013-05-15: qty 1

## 2013-05-15 MED ORDER — ONDANSETRON HCL 4 MG/2ML IJ SOLN
4.0000 mg | Freq: Once | INTRAMUSCULAR | Status: AC
  Administered 2013-05-15: 4 mg via INTRAVENOUS

## 2013-05-15 MED ORDER — ONDANSETRON HCL 4 MG/2ML IJ SOLN
4.0000 mg | Freq: Once | INTRAMUSCULAR | Status: DC
  Filled 2013-05-15: qty 2

## 2013-05-15 MED ORDER — SODIUM CHLORIDE 0.9 % IV SOLN
Freq: Once | INTRAVENOUS | Status: AC
  Administered 2013-05-15: 20:00:00 via INTRAVENOUS

## 2013-05-15 NOTE — Discharge Instructions (Signed)
Chest Pain (Nonspecific) °It is often hard to give a specific diagnosis for the cause of chest pain. There is always a chance that your pain could be related to something serious, such as a heart attack or a blood clot in the lungs. You need to follow up with your caregiver for further evaluation. °CAUSES  °· Heartburn. °· Pneumonia or bronchitis. °· Anxiety or stress. °· Inflammation around your heart (pericarditis) or lung (pleuritis or pleurisy). °· A blood clot in the lung. °· A collapsed lung (pneumothorax). It can develop suddenly on its own (spontaneous pneumothorax) or from injury (trauma) to the chest. °· Shingles infection (herpes zoster virus). °The chest wall is composed of bones, muscles, and cartilage. Any of these can be the source of the pain. °· The bones can be bruised by injury. °· The muscles or cartilage can be strained by coughing or overwork. °· The cartilage can be affected by inflammation and become sore (costochondritis). °DIAGNOSIS  °Lab tests or other studies, such as X-rays, electrocardiography, stress testing, or cardiac imaging, may be needed to find the cause of your pain.  °TREATMENT  °· Treatment depends on what may be causing your chest pain. Treatment may include: °· Acid blockers for heartburn. °· Anti-inflammatory medicine. °· Pain medicine for inflammatory conditions. °· Antibiotics if an infection is present. °· You may be advised to change lifestyle habits. This includes stopping smoking and avoiding alcohol, caffeine, and chocolate. °· You may be advised to keep your head raised (elevated) when sleeping. This reduces the chance of acid going backward from your stomach into your esophagus. °· Most of the time, nonspecific chest pain will improve within 2 to 3 days with rest and mild pain medicine. °HOME CARE INSTRUCTIONS  °· If antibiotics were prescribed, take your antibiotics as directed. Finish them even if you start to feel better. °· For the next few days, avoid physical  activities that bring on chest pain. Continue physical activities as directed. °· Do not smoke. °· Avoid drinking alcohol. °· Only take over-the-counter or prescription medicine for pain, discomfort, or fever as directed by your caregiver. °· Follow your caregiver's suggestions for further testing if your chest pain does not go away. °· Keep any follow-up appointments you made. If you do not go to an appointment, you could develop lasting (chronic) problems with pain. If there is any problem keeping an appointment, you must call to reschedule. °SEEK MEDICAL CARE IF:  °· You think you are having problems from the medicine you are taking. Read your medicine instructions carefully. °· Your chest pain does not go away, even after treatment. °· You develop a rash with blisters on your chest. °SEEK IMMEDIATE MEDICAL CARE IF:  °· You have increased chest pain or pain that spreads to your arm, neck, jaw, back, or abdomen. °· You develop shortness of breath, an increasing cough, or you are coughing up blood. °· You have severe back or abdominal pain, feel nauseous, or vomit. °· You develop severe weakness, fainting, or chills. °· You have a fever. °THIS IS AN EMERGENCY. Do not wait to see if the pain will go away. Get medical help at once. Call your local emergency services (911 in [place] Do not drive yourself to the hospital. °MAKE SURE YOU:  °· Understand these instructions. °· Will watch your condition. °· Will get help right away if you are not doing well or get worse. °Document Released: 12/09/2004 Document Revised: 05/24/2011 Document Reviewed: 10/05/2007 °ExitCare® Patient Information ©2014 [employer],   LLC. ° °Sickle Cell Anemia, Adult °Sickle cell anemia is a condition in which red blood cells have an abnormal "sickle" shape. This abnormal shape shortens the cells' life span, which results in a lower than normal concentration of red blood cells in the blood. The sickle shape also causes the cells to clump together and  block free blood flow through the blood vessels. As a result, the tissues and organs of the body do not receive enough oxygen. Sickle cell anemia causes organ damage and pain and increases the risk of infection. °CAUSES  °Sickle cell anemia is a genetic disorder. Those who receive two copies of the gene have the condition, and those who receive one copy have the trait. °RISK FACTORS °The sickle cell gene is most common in people whose families originated in [place]. Other areas of the globe where sickle cell trait occurs include the Mediterranean, [place] and [place], the [place], and the [place].  °SIGNS AND SYMPTOMS °· Pain, especially in the extremities, back, chest, or abdomen (common). The pain may start suddenly or may develop following an illness, especially if there is dehydration. Pain can also occur due to overexertion or exposure to extreme temperature changes. °· Frequent severe bacterial infections, especially certain types of pneumonia and meningitis. °· Pain and swelling in the hands and feet. °· Decreased activity.   °· Loss of appetite.   °· Change in behavior. °· Headaches. °· Seizures. °· Shortness of breath or difficulty breathing. °· Vision changes. °· Skin ulcers. °Those with the trait may not have symptoms or they may have mild symptoms.  °DIAGNOSIS  °Sickle cell anemia is diagnosed with blood tests that demonstrate the genetic trait. It is often diagnosed during the newborn period, due to mandatory testing nationwide. A variety of blood tests, X-rays, CT scans, MRI scans, ultrasounds, and lung function tests may also be done to monitor the condition. °TREATMENT  °Sickle cell anemia may be treated with: °· Medicines. You may be given pain medicines, antibiotic medicines (to treat and prevent infections) or medicines to increase the production of certain types of hemoglobin. °· Fluids. °· Oxygen. °· Blood transfusions. °HOME CARE INSTRUCTIONS  °· Drink enough fluid to keep your urine  clear or pale yellow. Increase your fluid intake in hot weather and during exercise. °· Do not smoke. Smoking lowers oxygen levels in the blood.   °· Only take over-the-counter or prescription medicines for pain, fever, or discomfort as directed by your health care provider. °· Take antibiotics as directed by your health care provider. Make sure you finish them it even if you start to feel better.   °· Take supplements as directed by your health care provider.   °· Consider wearing a medical alert bracelet. This tells anyone caring for you in an emergency of your condition.   °· When traveling, keep your medical information, health care provider's names, and the medicines you take with you at all times.   °· If you develop a fever, do not take medicines to reduce the fever right away. This could cover up a problem that is developing. Notify your health care provider. °· Keep all follow-up appointments with your health care provider. Sickle cell anemia requires regular medical care. °SEEK MEDICAL CARE IF: ° You have a fever. °SEEK IMMEDIATE MEDICAL CARE IF:  °· You feel dizzy or faint.   °· You have new abdominal pain, especially on the left side near the stomach area.   °· You develop a persistent, often uncomfortable and painful penile erection (priapism). If this is not   treated immediately it will lead to impotence.   °· You have numbness your arms or legs or you have a hard time moving them.   °· You have a hard time with speech.   °· You have a fever or persistent symptoms for more than 2 3 days.   °· You have a fever and your symptoms suddenly get worse.   °· You have signs or symptoms of infection. These include:   °· Chills.   °· Abnormal tiredness (lethargy).   °· Irritability.   °· Poor eating.   °· Vomiting.   °· You develop pain that is not helped with medicine.   °· You develop shortness of breath. °· You have pain in your chest.   °· You are coughing up pus-like or bloody sputum.   °· You develop a  stiff neck. °· Your feet or hands swell or have pain. °· Your abdomen appears bloated. °· You develop joint pain. °MAKE SURE YOU: °· Understand these instructions. °· Will watch your child's condition. °· Will get help right away if your child is not doing well or gets worse. °Document Released: 06/09/2005 Document Revised: 12/20/2012 Document Reviewed: 10/11/2012 °ExitCare® Patient Information ©2014 [employer] LLC. ° °

## 2013-05-15 NOTE — ED Provider Notes (Signed)
Medical screening examination/treatment/procedure(s) were performed by non-physician practitioner and as supervising physician I was immediately available for consultation/collaboration.   EKG Interpretation   Date/Time:  Tuesday May 15 2013 18:52:07 EST Ventricular Rate:  88 PR Interval:  210 QRS Duration: 94 QT Interval:  362 QTC Calculation: 438 R Axis:   35 Text Interpretation:  Sinus rhythm with 1st degree A-V block Nonspecific T  wave abnormality Abnormal ECG Confirmed by Landri Dorsainvil  MD, Rosmary Dionisio ([id number]) on  05/15/2013 10:42:29 PM        Rolan BuccoMelanie Ambry Dix, MD 05/15/13 2332

## 2013-05-15 NOTE — ED Notes (Signed)
Pt. Reports pain in the mid chest with L and R side pain as well.  No nausea or vomiting noted.

## 2013-05-15 NOTE — ED Provider Notes (Signed)
CSN: [id number]     Arrival date & time 05/15/13  1827 History   First MD Initiated Contact with Patient 05/15/13 1956     Chief Complaint  Patient presents with  . Chest Pain     (Consider location/radiation/quality/duration/timing/severity/associated sxs/prior Treatment) Patient is a 28 y.o. female presenting with chest pain. The history is provided by the patient. No language interpreter was used.  Chest Pain Pain location:  R chest Pain quality: aching and sharp   Pain radiates to:  Does not radiate Pain radiates to the back: no   Pain severity:  Moderate Onset quality:  Gradual Timing:  Constant Progression:  Worsening Chronicity:  New Context: not breathing and not lifting   Relieved by:  Nothing Worsened by:  Nothing tried Ineffective treatments:  None tried Associated symptoms: no abdominal pain   Risk factors: obesity     Past Medical History  Diagnosis Date  . Sickle cell anemia    Past Surgical History  Procedure Laterality Date  . Cholecystectomy    . Portacath placement     No family history on file. History  Substance Use Topics  . Smoking status: Current Every Day Smoker  . Smokeless tobacco: Never Used  . Alcohol Use: No     Comment: occasion   OB History   Grav Para Term Preterm Abortions TAB SAB Ect Mult Living                 Review of Systems  Cardiovascular: Positive for chest pain.  Gastrointestinal: Negative for abdominal pain.  All other systems reviewed and are negative.      Allergies  Review of patient's allergies indicates no known allergies.  Home Medications   Current Outpatient Rx  Name  Route  Sig  Dispense  Refill  . amoxicillin-clavulanate (AUGMENTIN) 500-125 MG per tablet   Oral   Take 1 tablet (500 mg total) by mouth every 8 (eight) hours.   30 tablet   0   . aspirin 81 MG tablet   Oral   Take 81 mg by mouth daily.           . cetirizine-pseudoephedrine (ZYRTEC-D) 5-120 MG per tablet   Oral   Take 1  tablet by mouth 2 (two) times daily.   30 tablet   0   . deferasirox (EXJADE) 500 MG disintegrating tablet   Oral   Take 1,500 mg by mouth daily.           . folic acid (FOLVITE) 400 MCG tablet   Oral   Take 400 mcg by mouth daily.           Marland Kitchen HYDROcodone-homatropine (HYDROMET) 5-1.5 MG/5ML syrup   Oral   Take 5 mLs by mouth every 6 (six) hours as needed for cough.   120 mL   0   . hydroxyurea (HYDREA) 500 MG capsule   Oral   Take 1,500 mg by mouth every other day. May take with food to minimize GI side effects.          . hydroxyurea (HYDREA) 500 MG capsule   Oral   Take 2,000 mg by mouth every other day. May take with food to minimize GI side effects.          Marland Kitchen ibuprofen (ADVIL,MOTRIN) 600 MG tablet   Oral   Take 1 tablet (600 mg total) by mouth every 6 (six) hours as needed for pain.   30 tablet   0   . ibuprofen (  ADVIL,MOTRIN) 800 MG tablet   Oral   Take 1 tablet (800 mg total) by mouth 3 (three) times daily.   21 tablet   0   . medroxyPROGESTERone (DEPO-PROVERA) 150 MG/ML injection   Intramuscular   Inject 150 mg into the muscle every 3 (three) months.           . metroNIDAZOLE (FLAGYL) 500 MG tablet   Oral   Take 1 tablet (500 mg total) by mouth 2 (two) times daily.   14 tablet   0   . oxyCODONE-acetaminophen (ROXICET) 5-325 MG per tablet   Oral   Take 1 tablet by mouth every 4 (four) hours as needed for severe pain.   30 tablet   0    BP 116/82  Pulse 91  Temp(Src) 98.5 F (36.9 C) (Oral)  Resp 18  Ht 5' (1.524 m)  Wt 225 lb (102.059 kg)  BMI 43.94 kg/m2  SpO2 99%  LMP 05/11/2013 Physical Exam  Nursing note and vitals reviewed. Constitutional: She is oriented to person, place, and time. She appears well-developed and well-nourished.  HENT:  Head: Normocephalic and atraumatic.  Eyes: Conjunctivae and EOM are normal. Pupils are equal, round, and reactive to light.  Neck: Normal range of motion.  Cardiovascular: Normal rate and  regular rhythm.   Pulmonary/Chest: Effort normal and breath sounds normal.  Abdominal: Soft. She exhibits no distension.  Musculoskeletal: Normal range of motion.  Neurological: She is alert and oriented to person, place, and time.  Skin: Skin is warm.  Psychiatric: She has a normal mood and affect.    ED Course  Procedures (including critical care time) Labs Review Labs Reviewed - No data to display Imaging Review No results found.   EKG Interpretation None      MDM   Final diagnoses:  Chest pain  Sickle cell anemia    Labs reviewed,   Normal range,  No pneumonia.   [facility]   Call tomorrow to schedule appointment.  [facility]   Pt given number for sickle cell clinic    Elson AreasLeslie K Sofia, PA-C 05/15/13 2257

## 2013-05-15 NOTE — ED Notes (Signed)
Pt states that she was type and crossed today in preparation for [facility]

## 2013-05-16 LAB — PATHOLOGIST SMEAR REVIEW

## 2013-05-22 NOTE — ED Provider Notes (Signed)
Medical screening examination/treatment/procedure(s) were performed by non-physician practitioner and as supervising physician I was immediately available for consultation/collaboration.   EKG Interpretation None        Verlena Marlette, MD 05/22/13 1622 

## 2013-07-16 ENCOUNTER — Encounter (HOSPITAL_BASED_OUTPATIENT_CLINIC_OR_DEPARTMENT_OTHER): Payer: Self-pay | Admitting: Emergency Medicine

## 2013-07-16 ENCOUNTER — Emergency Department (HOSPITAL_BASED_OUTPATIENT_CLINIC_OR_DEPARTMENT_OTHER)
Admission: EM | Admit: 2013-07-16 | Discharge: 2013-07-16 | Disposition: A | Discharge status: Home / Self Care | Payer: Medicare Other | Attending: Emergency Medicine | Admitting: Emergency Medicine

## 2013-07-16 DIAGNOSIS — Z7982 Long term (current) use of aspirin: Secondary | ICD-10-CM | POA: Insufficient documentation

## 2013-07-16 DIAGNOSIS — Z79899 Other long term (current) drug therapy: Secondary | ICD-10-CM | POA: Insufficient documentation

## 2013-07-16 DIAGNOSIS — Z791 Long term (current) use of non-steroidal anti-inflammatories (NSAID): Secondary | ICD-10-CM | POA: Insufficient documentation

## 2013-07-16 DIAGNOSIS — Z87891 Personal history of nicotine dependence: Secondary | ICD-10-CM | POA: Insufficient documentation

## 2013-07-16 DIAGNOSIS — D571 Sickle-cell disease without crisis: Secondary | ICD-10-CM | POA: Insufficient documentation

## 2013-07-16 DIAGNOSIS — Z792 Long term (current) use of antibiotics: Secondary | ICD-10-CM | POA: Insufficient documentation

## 2013-07-16 DIAGNOSIS — J039 Acute tonsillitis, unspecified: Secondary | ICD-10-CM

## 2013-07-16 LAB — RAPID STREP SCREEN (MED CTR MEBANE ONLY): STREPTOCOCCUS, GROUP A SCREEN (DIRECT): NEGATIVE

## 2013-07-16 MED ORDER — PREDNISONE 10 MG PO TABS: 20.0000 mg | ORAL_TABLET | Freq: Two times a day (BID) | ORAL | Status: DC

## 2013-07-16 MED ORDER — CEPHALEXIN 500 MG PO CAPS: 500.0000 mg | ORAL_CAPSULE | Freq: Four times a day (QID) | ORAL | Status: DC

## 2013-07-16 NOTE — Discharge Instructions (Signed)
Keflex and prednisone as prescribed.  Ibuprofen 600 mg every 6 hours as needed for pain.  Return to the emergency department if you develop difficulty breathing or swallowing, or other new and concerning symptoms.   Tonsillitis Tonsillitis is an infection of the throat that causes the tonsils to become red, tender, and swollen. Tonsils are collections of lymphoid tissue at the back of the throat. Each tonsil has crevices (crypts). Tonsils help fight nose and throat infections and keep infection from spreading to other parts of the body for the first 18 months of life.  CAUSES Sudden (acute) tonsillitis is usually caused by infection with streptococcal bacteria. Long-lasting (chronic) tonsillitis occurs when the crypts of the tonsils become filled with pieces of food and bacteria, which makes it easy for the tonsils to become repeatedly infected. SYMPTOMS  Symptoms of tonsillitis include:  A sore throat, with possible difficulty swallowing.  White patches on the tonsils.  Fever.  Tiredness.  New episodes of snoring during sleep, when you did not snore before.  Small, foul-smelling, yellowish-white pieces of material (tonsilloliths) that you occasionally cough up or spit out. The tonsilloliths can also cause you to have bad breath. DIAGNOSIS Tonsillitis can be diagnosed through a physical exam. Diagnosis can be confirmed with the results of lab tests, including a throat culture. TREATMENT  The goals of tonsillitis treatment include the reduction of the severity and duration of symptoms and prevention of associated conditions. Symptoms of tonsillitis can be improved with the use of steroids to reduce the swelling. Tonsillitis caused by bacteria can be treated with antibiotics. Usually, treatment with antibiotics is started before the cause of the tonsillitis is known. However, if it is determined that the cause is not bacterial, antibiotics will not treat the tonsillitis. If attacks of  tonsillitis are severe and frequent, your caregiver may recommend surgery to remove the tonsils (tonsillectomy). HOME CARE INSTRUCTIONS   Rest as much as possible and get plenty of sleep.  Drink plenty of fluids. While the throat is very sore, eat soft foods or liquids, such as sherbet, soups, or instant breakfast drinks.  Eat frozen ice pops.  Gargle with a warm or cold liquid to help soothe the throat. Mix 1/4 teaspoon of salt and 1/4 teaspoon of baking soda in in 8 oz of water. SEEK MEDICAL CARE IF:   Large, tender lumps develop in your neck.  A rash develops.  A green, yellow-brown, or bloody substance is coughed up.  You are unable to swallow liquids or food for 24 hours.  You notice that only one of the tonsils is swollen. SEEK IMMEDIATE MEDICAL CARE IF:   You develop any new symptoms such as vomiting, severe headache, stiff neck, chest pain, or trouble breathing or swallowing.  You have severe throat pain along with drooling or voice changes.  You have severe pain, unrelieved with recommended medications.  You are unable to fully open the mouth.  You develop redness, swelling, or severe pain anywhere in the neck.  You have a fever. MAKE SURE YOU:   Understand these instructions.  Will watch your condition.  Will get help right away if you are not doing well or get worse. Document Released: 12/09/2004 Document Revised: 11/01/2012 Document Reviewed: 08/18/2012 [facility] Patient Information 2014 [place].

## 2013-07-16 NOTE — ED Provider Notes (Signed)
CSN: [id number]     Arrival date & time 07/16/13  0800 History   First MD Initiated Contact with Patient 07/16/13 [id number]     Chief Complaint  Patient presents with  . Sore Throat     (Consider location/radiation/quality/duration/timing/severity/associated sxs/prior Treatment) Patient is a 28 y.o. female presenting with pharyngitis. The history is provided by the patient.  Sore Throat This is a new problem. Episode onset: 3 days ago. The problem occurs constantly. The problem has been gradually worsening. Pertinent negatives include no chest pain. The symptoms are aggravated by swallowing. Nothing relieves the symptoms. She has tried nothing for the symptoms. The treatment provided no relief.    Past Medical History  Diagnosis Date  . Sickle cell anemia    Past Surgical History  Procedure Laterality Date  . Cholecystectomy    . Portacath placement     No family history on file. History  Substance Use Topics  . Smoking status: Former Games developermoker  . Smokeless tobacco: Never Used  . Alcohol Use: Yes     Comment: occasion   OB History   Grav Para Term Preterm Abortions TAB SAB Ect Mult Living                 Review of Systems  Cardiovascular: Negative for chest pain.  All other systems reviewed and are negative.     Allergies  Review of patient's allergies indicates no known allergies.  Home Medications   Prior to Admission medications   Medication Sig Start Date End Date Taking? Authorizing Provider  amoxicillin-clavulanate (AUGMENTIN) 500-125 MG per tablet Take 1 tablet (500 mg total) by mouth every 8 (eight) hours. 05/08/12   Derwood KaplanAnkit Nanavati, MD  aspirin 81 MG tablet Take 81 mg by mouth daily.      Historical Provider, MD  cetirizine-pseudoephedrine (ZYRTEC-D) 5-120 MG per tablet Take 1 tablet by mouth 2 (two) times daily. 01/10/13   Teressa LowerVrinda Pickering, NP  deferasirox (EXJADE) 500 MG disintegrating tablet Take 1,500 mg by mouth daily.      Historical Provider, MD  folic acid  (FOLVITE) 400 MCG tablet Take 400 mcg by mouth daily.      Historical Provider, MD  HYDROcodone-homatropine (HYDROMET) 5-1.5 MG/5ML syrup Take 5 mLs by mouth every 6 (six) hours as needed for cough. 01/10/13   Teressa LowerVrinda Pickering, NP  hydroxyurea (HYDREA) 500 MG capsule Take 1,500 mg by mouth every other day. May take with food to minimize GI side effects.     Historical Provider, MD  hydroxyurea (HYDREA) 500 MG capsule Take 2,000 mg by mouth every other day. May take with food to minimize GI side effects.     Historical Provider, MD  ibuprofen (ADVIL,MOTRIN) 600 MG tablet Take 1 tablet (600 mg total) by mouth every 6 (six) hours as needed for pain. 05/08/12   Derwood KaplanAnkit Nanavati, MD  ibuprofen (ADVIL,MOTRIN) 800 MG tablet Take 1 tablet (800 mg total) by mouth 3 (three) times daily. 02/20/13   Teressa LowerVrinda Pickering, NP  medroxyPROGESTERone (DEPO-PROVERA) 150 MG/ML injection Inject 150 mg into the muscle every 3 (three) months.      Historical Provider, MD  metroNIDAZOLE (FLAGYL) 500 MG tablet Take 1 tablet (500 mg total) by mouth 2 (two) times daily. 05/13/13   Hope Orlene OchM Neese, NP  oxyCODONE-acetaminophen (ROXICET) 5-325 MG per tablet Take 1 tablet by mouth every 4 (four) hours as needed for severe pain. 05/13/13   Hope Orlene OchM Neese, NP   BP 124/64  Pulse 76  Temp(Src) 100.1 F (  37.8 C) (Oral)  Resp 18  Ht 5' (1.524 m)  Wt 204 lb (92.534 kg)  BMI 39.84 kg/m2  SpO2 98% Physical Exam  Nursing note and vitals reviewed. Constitutional: She is oriented to person, place, and time. She appears well-developed and well-nourished. No distress.  HENT:  Head: Normocephalic and atraumatic.  The bilateral tonsils are swollen and erythematous with slight exudates present. There is no stridor.  Neck: Normal range of motion. Neck supple.  Cardiovascular: Normal rate, regular rhythm and normal heart sounds.   No murmur heard. Pulmonary/Chest: Effort normal and breath sounds normal. No respiratory distress.  Musculoskeletal: Normal  range of motion. She exhibits no edema.  Lymphadenopathy:    She has cervical adenopathy.  Neurological: She is alert and oriented to person, place, and time.  Skin: Skin is warm and dry. She is not diaphoretic.    ED Course  Procedures (including critical care time) Labs Review Labs Reviewed  RAPID STREP SCREEN    Imaging Review No results found.   EKG Interpretation None      MDM   Final diagnoses:  None    Strep test is negative, however we'll treat as tonsillitis with Keflex and prednisone. Strep culture is pending.    Geoffery Lyonsouglas Laronda Lisby, MD 07/16/13 [phone]

## 2013-07-16 NOTE — ED Notes (Signed)
MD at bedside. 

## 2013-07-16 NOTE — ED Notes (Signed)
Pt reports sore throat and cough x 3 days.  Denies fever.

## 2013-07-17 LAB — CULTURE, GROUP A STREP

## 2014-08-07 ENCOUNTER — Encounter (HOSPITAL_BASED_OUTPATIENT_CLINIC_OR_DEPARTMENT_OTHER): Payer: Self-pay

## 2014-08-07 ENCOUNTER — Emergency Department (HOSPITAL_BASED_OUTPATIENT_CLINIC_OR_DEPARTMENT_OTHER)
Admission: EM | Admit: 2014-08-07 | Discharge: 2014-08-07 | Disposition: A | Discharge status: Home / Self Care | Payer: Medicare Other | Attending: Emergency Medicine | Admitting: Emergency Medicine

## 2014-08-07 DIAGNOSIS — Z87891 Personal history of nicotine dependence: Secondary | ICD-10-CM | POA: Diagnosis not present

## 2014-08-07 DIAGNOSIS — Z862 Personal history of diseases of the blood and blood-forming organs and certain disorders involving the immune mechanism: Secondary | ICD-10-CM | POA: Insufficient documentation

## 2014-08-07 DIAGNOSIS — K120 Recurrent oral aphthae: Secondary | ICD-10-CM | POA: Diagnosis not present

## 2014-08-07 DIAGNOSIS — R220 Localized swelling, mass and lump, head: Secondary | ICD-10-CM | POA: Diagnosis present

## 2014-08-07 MED ORDER — BENZOCAINE 10 % MT GEL: 1.0000 "application " | OROMUCOSAL | Status: DC | PRN

## 2014-08-07 MED ORDER — DOCOSANOL 10 % EX CREA: TOPICAL_CREAM | CUTANEOUS | Status: DC

## 2014-08-07 NOTE — Discharge Instructions (Signed)
Use both creams as prescribed.  Oral Ulcers Oral ulcers are painful, shallow sores around the lining of the mouth. They can affect the gums, the inside of the lips, and the cheeks. (Sores on the outside of the lips and on the face are different.) They typically first occur in school-aged children and teenagers. Oral ulcers may also be called canker sores or cold sores. CAUSES  Canker sores and cold sores can be caused by many factors including:  Infection.  Injury.  Sun exposure.  Medications.  Emotional stress.  Food allergies.  Vitamin deficiencies.  Toothpastes containing sodium lauryl sulfate. The herpes virus can be the cause of mouth ulcers. The first infection can be severe and cause 10 or more ulcers on the gums, tongue, and lips with fever and difficulty in swallowing. This infection usually occurs between the ages of 1 and 3 years.  SYMPTOMS  The typical sore is about  inch (6 mm) in size and is an oval or round ulcer with red borders. DIAGNOSIS  Your caregiver can diagnose simple oral ulcers by examination. Additional testing is usually not required.  TREATMENT  Treatment is aimed at pain relief. Generally, oral ulcers resolve by themselves within 1 to 2 weeks without medication and are not contagious unless caused by herpes (and other viruses). Antibiotics are not effective with mouth sores. Avoid direct contact with others until the ulcer is completely healed. See your caregiver for follow-up care as recommended. Also:  Offer a soft diet.  Encourage plenty of fluids to prevent dehydration. Popsicles and milk shakes can be helpful.  Avoid acidic and salty foods and drinks such as orange juice.  Infants and young children will often refuse to drink because of pain. Using a teaspoon, cup, or syringe to give small amounts of fluids frequently can help prevent dehydration.  Cold compresses on the face may help reduce pain.  Pain medication can help control  soreness.  A solution of diphenhydramine mixed with a liquid antacid can be useful to decrease the soreness of ulcers. Consult a caregiver for the dosing.  Liquids or ointments with a numbing ingredient may be helpful when used as recommended.  Older children and teenagers can rinse their mouth with a salt-water mixture (1/2 teaspoon of salt in 8 ounces of water) four times a day. This treatment is uncomfortable but may reduce the time the ulcers are present.  There are many over-the-counter throat lozenges and medications available for oral ulcers. Their effectiveness has not been studied.  Consult your medical caregiver prior to using homeopathic treatments for oral ulcers. SEEK MEDICAL CARE IF:   You think your child needs to be seen.  The pain worsens and you cannot control it.  There are 4 or more ulcers.  The lips and gums begin to bleed and crust.  A single mouth ulcer is near a tooth that is causing a toothache or pain.  Your child has a fever, swollen face, or swollen glands.  The ulcers began after starting a medication.  Mouth ulcers keep reoccurring or last more than 2 weeks.  You think your child is not taking adequate fluids. SEEK IMMEDIATE MEDICAL CARE IF:   Your child has a high fever.  Your child is unable to swallow or becomes dehydrated.  Your child looks or acts very ill.  An ulcer caused by a chemical your child accidentally put in their mouth. Document Released: 04/08/2004 Document Revised: 07/16/2013 Document Reviewed: 11/21/2008 [facility] Patient Information 2015 [place], [place]. This information  is not intended to replace advice given to you by your health care provider. Make sure you discuss any questions you have with your health care provider.  Sore or Dry Mouth Care A sore or dry mouth may happen for many different reasons. Sometimes, treatment for other health problems may have to stop until your sore or dry mouth gets better.  HOME CARE  Do  not smoke or chew tobacco.  Use fake (artificial) saliva when your mouth feels dry.  Use a humidifier in your bedroom at night.  Eat small meals and snacks.  Eat food cold or at room temperature.  Suck on ice-chips or try frozen ice pops or juice bars, ice-cream, and watermelon. Do not have citrus flavors.  Suck on hard, sugarless, sour candy, or chew sugarless gum to help make more saliva.  Eat soft foods such as yogurt, bananas, canned fruit, mashed potatoes, oatmeal, rice, eggs, cottage cheese, macaroni and cheese, jello, and pudding.  Microwave vegetables and fruits to soften them.  Puree cooked food in a blender if needed.  Make dry food moist by using olive oil, gravy, or mild sauces. Dip foods in liquids.  Keep a glass of water or squirt bottle nearby. Take sips often throughout the day.  Limit caffeine.  Avoid:  Pop or fizzy drinks.  Alcohol.  Citrus juices.  Acidic food.  Salty or spicy food.  Foods or drinks that are very hot.  Hard or crunchy food. Mouth Care  Wash your hands well with soap and water before doing mouth care.  Use fake saliva as told by your doctor.  Use medicine on the sore places.  Brush your teeth at least 2 times a day. Brush after each meal if possible. Rinse your mouth with water after each meal and after drinking a sweet drink.  Brush slowly and gently in small circles. Do not brush side-to-side.  Use regular toothpastes, but stay away from ones that have sodium laurel sulfate in them.  Gargle with a baking soda mouthwash ( teaspoon baking soda mixed in with 4 cups of water).  Gargle with medicated mouthwash.  Use dental floss or dental tape to clean between your teeth every day.  Use a lanolin-based lip balm to keep your lips from getting dry.  If you wear dentures or bridges:  You may need to leave them out until your doctor tells you to start wearing them again.  Take them out at night if you wear them daily.  Soak them in warm water or denture solution. Take your dentures out as much as you can during the day. Take them out when you use mouthwash.  After each meal, brush your gums gently with a soft brush and rinse your mouth with water.  If your dentures rub on your gums and cause a sore spot, have your dentist check and fix your dentures right away. GET HELP RIGHT AWAY IF:   Your mouth gets more painful or dry.  You have questions. MAKE SURE YOU:  Understand these instructions.  Will watch your condition.  Will get help right away if you are not doing well or get worse. Document Released: 12/27/2008 Document Revised: 05/24/2011 Document Reviewed: 12/27/2008 [facility] Patient Information 2015 [place]. This information is not intended to replace advice given to you by your health care provider. Make sure you discuss any questions you have with your health care provider.

## 2014-08-07 NOTE — ED Provider Notes (Signed)
CSN: [id number]     Arrival date & time 08/07/14  1208 History   First MD Initiated Contact with Patient 08/07/14 1218     Chief Complaint  Patient presents with  . Oral Swelling     (Consider location/radiation/quality/duration/timing/severity/associated sxs/prior Treatment) HPI Comments: 29 y/o F c/o a "blister" to her upper lip on the right x5 days. States she has been biting the inside of her lip. Tried applying hydrogen peroxide and Blistex with no change. States it is starting to appear "puslike". No contacts with similar symptoms. No fevers.  The history is provided by the patient.    Past Medical History  Diagnosis Date  . Sickle cell anemia    Past Surgical History  Procedure Laterality Date  . Cholecystectomy    . Portacath placement     No family history on file. History  Substance Use Topics  . Smoking status: Former Games developermoker  . Smokeless tobacco: Never Used  . Alcohol Use: Yes     Comment: occasion   OB History    No data available     Review of Systems  HENT:       + Lip blister  All other systems reviewed and are negative.     Allergies  Review of patient's allergies indicates no known allergies.  Home Medications   Prior to Admission medications   Medication Sig Start Date End Date Taking? Authorizing Provider  benzocaine (ORAJEL) 10 % mucosal gel Use as directed 1 application in the mouth or throat as needed for mouth pain. 08/07/14   Shyteria Lewis M Raymona Boss, PA-C  Docosanol (ABREVA) 10 % CREA Apply to affected area twice daily. 08/07/14   Layaan Mott M Aadin Gaut, PA-C   BP 118/65 mmHg  Pulse 97  Temp(Src) 98.4 F (36.9 C) (Oral)  Resp 18  Ht 5\' 1"  (1.549 m)  Wt 198 lb (89.812 kg)  BMI 37.43 kg/m2  SpO2 97%  LMP 07/31/2014 Physical Exam  Constitutional: She is oriented to person, place, and time. She appears well-developed and well-nourished. No distress.  HENT:  Head: Normocephalic and atraumatic.  Mouth/Throat: Oropharynx is clear and moist.    Eyes:  Conjunctivae and EOM are normal.  Neck: Normal range of motion. Neck supple.  Cardiovascular: Normal rate, regular rhythm and normal heart sounds.   Pulmonary/Chest: Effort normal and breath sounds normal. No respiratory distress.  Musculoskeletal: Normal range of motion. She exhibits no edema.  Neurological: She is alert and oriented to person, place, and time. No sensory deficit.  Skin: Skin is warm and dry.  Psychiatric: She has a normal mood and affect. Her behavior is normal.  Nursing note and vitals reviewed.   ED Course  Procedures (including critical care time) Labs Review Labs Reviewed - No data to display  Imaging Review No results found.   EKG Interpretation None      MDM   Final diagnoses:  Aphthous ulcer of mouth   NAD. VSS. Appearance of aphthous ulcer, however has a small amount of purulent drainage and slight appearance of herpes labialis. Rx orajel and abreva. F/u with PCP. Stable for d/c. Return precautions given. Patient states understanding of treatment care plan and is agreeable.  Kathrynn SpeedRobyn M Stephanne Greeley, PA-C 08/07/14 1243  Tilden FossaElizabeth Rees, MD 08/07/14 [phone]

## 2014-08-07 NOTE — ED Notes (Signed)
C/o "blister" to upper lip since 5/20

## 2015-04-30 ENCOUNTER — Encounter (HOSPITAL_BASED_OUTPATIENT_CLINIC_OR_DEPARTMENT_OTHER): Payer: Self-pay | Admitting: Emergency Medicine

## 2015-04-30 ENCOUNTER — Emergency Department (HOSPITAL_BASED_OUTPATIENT_CLINIC_OR_DEPARTMENT_OTHER)
Admission: EM | Admit: 2015-04-30 | Discharge: 2015-04-30 | Disposition: A | Discharge status: Home / Self Care | Payer: Medicare Other | Attending: Emergency Medicine | Admitting: Emergency Medicine

## 2015-04-30 DIAGNOSIS — Z862 Personal history of diseases of the blood and blood-forming organs and certain disorders involving the immune mechanism: Secondary | ICD-10-CM | POA: Diagnosis not present

## 2015-04-30 DIAGNOSIS — Z79899 Other long term (current) drug therapy: Secondary | ICD-10-CM | POA: Insufficient documentation

## 2015-04-30 DIAGNOSIS — R079 Chest pain, unspecified: Secondary | ICD-10-CM | POA: Diagnosis not present

## 2015-04-30 DIAGNOSIS — R040 Epistaxis: Secondary | ICD-10-CM | POA: Insufficient documentation

## 2015-04-30 DIAGNOSIS — R05 Cough: Secondary | ICD-10-CM | POA: Diagnosis present

## 2015-04-30 DIAGNOSIS — Z87891 Personal history of nicotine dependence: Secondary | ICD-10-CM | POA: Diagnosis not present

## 2015-04-30 DIAGNOSIS — J029 Acute pharyngitis, unspecified: Secondary | ICD-10-CM

## 2015-04-30 DIAGNOSIS — B9789 Other viral agents as the cause of diseases classified elsewhere: Secondary | ICD-10-CM

## 2015-04-30 DIAGNOSIS — J069 Acute upper respiratory infection, unspecified: Secondary | ICD-10-CM | POA: Diagnosis not present

## 2015-04-30 MED ORDER — OXYMETAZOLINE HCL 0.05 % NA SOLN
NASAL | Status: AC
  Administered 2015-04-30: 2 via NASAL
  Filled 2015-04-30: qty 15

## 2015-04-30 MED ORDER — BENZONATATE 100 MG PO CAPS: 100.0000 mg | ORAL_CAPSULE | Freq: Three times a day (TID) | ORAL | Status: DC

## 2015-04-30 MED ORDER — BENZONATATE 100 MG PO CAPS
100.0000 mg | ORAL_CAPSULE | Freq: Once | ORAL | Status: AC
  Administered 2015-04-30: 100 mg via ORAL
  Filled 2015-04-30: qty 1

## 2015-04-30 MED ORDER — NAPROXEN 500 MG PO TABS: 500.0000 mg | ORAL_TABLET | Freq: Two times a day (BID) | ORAL | Status: DC

## 2015-04-30 MED ORDER — OXYMETAZOLINE HCL 0.05 % NA SOLN
2.0000 | Freq: Once | NASAL | Status: AC
  Administered 2015-04-30: 2 via NASAL

## 2015-04-30 MED ORDER — NAPROXEN 250 MG PO TABS
500.0000 mg | ORAL_TABLET | Freq: Once | ORAL | Status: AC
  Administered 2015-04-30: 500 mg via ORAL
  Filled 2015-04-30: qty 2

## 2015-04-30 MED FILL — NAPROXEN 500 MG TABLET: 500 | 15 days supply | Qty: 30 | Fill #0

## 2015-04-30 MED FILL — BENZONATATE 100 MG CAPSULE: 100 | 7 days supply | Qty: 21 | Fill #0

## 2015-04-30 NOTE — ED Notes (Signed)
Pt having nose bleed.  Notified md.

## 2015-04-30 NOTE — ED Notes (Signed)
Epitaxis has stopped. Pt states "I want to leave now." d/c amb with quick steady gait in nad.

## 2015-04-30 NOTE — ED Notes (Signed)
Cough, sore throat since Sunday.  Intermittent nose bleeds.  Intermittent vomiting after coughing spell.  Unsure of fever.

## 2015-04-30 NOTE — ED Provider Notes (Signed)
CSN: [id number]     Arrival date & time 04/30/15  0831 History   First MD Initiated Contact with Pamela Hudson 04/30/15 [phone]     Chief Complaint  Pamela Hudson presents with  . Cough     (Consider location/radiation/quality/duration/timing/severity/associated sxs/prior Treatment) HPI Comments: Pt comes in with cc of cough and sore throat. Symptoms started on Sunday. With cough there is clear mucus. Pt has chest pain when she has severe cough, no dib. With the sore throat there is pain with swallowing, and change in voice. Pt has malaise, body aches. No drooling. No isck contacts. Pt also has nose bleeds - started yday, as a result of heavy coughing. Pt denies runny nose, congestion or trauma to the nose. Pt is not pregnant, LMP 1 month ago.     Pamela Hudson is a 30 y.o. female presenting with cough. The history is provided by the Pamela Hudson.  Cough Associated symptoms: chest pain and sore throat   Associated symptoms: no chills, no fever, no headaches, no rhinorrhea and no shortness of breath     Past Medical History  Diagnosis Date  . Sickle cell anemia [facility])    Past Surgical History  Procedure Laterality Date  . Cholecystectomy    . Portacath placement     No family history on file. Social History  Substance Use Topics  . Smoking status: Former Games developer  . Smokeless tobacco: Never Used  . Alcohol Use: Yes     Comment: occasion   OB History    No data available     Review of Systems  Constitutional: Positive for activity change and fatigue. Negative for fever and chills.  HENT: Positive for nosebleeds, postnasal drip and sore throat. Negative for congestion, drooling, hearing loss and rhinorrhea.   Respiratory: Positive for cough. Negative for shortness of breath.   Cardiovascular: Positive for chest pain.  Gastrointestinal: Negative for nausea, vomiting and abdominal pain.  Genitourinary: Negative for dysuria.  Musculoskeletal: Negative for neck pain.  Neurological: Negative for  headaches.      Allergies  Review of Pamela Hudson's allergies indicates no known allergies.  Home Medications   Prior to Admission medications   Medication Sig Start Date End Date Taking? Authorizing Provider  deferasirox (EXJADE) 500 MG disintegrating tablet Take 500 mg by mouth daily before breakfast.   Yes Historical Provider, MD  hydroxyurea (HYDREA) 500 MG capsule Take 500 mg by mouth daily. May take with food to minimize GI side effects.   Yes Historical Provider, MD  benzonatate (TESSALON) 100 MG capsule Take 1 capsule (100 mg total) by mouth every 8 (eight) hours. 04/30/15   Derwood Kaplan, MD  Docosanol (ABREVA) 10 % CREA Apply to affected area twice daily. 08/07/14   Robyn M Hess, PA-C  naproxen (NAPROSYN) 500 MG tablet Take 1 tablet (500 mg total) by mouth 2 (two) times daily. 04/30/15   Audrionna Lampton Rhunette Croft, MD   BP 153/63 mmHg  Pulse 105  Temp(Src) 98.5 F (36.9 C) (Oral)  Resp 20  Ht 5' (1.524 m)  Wt 220 lb (99.791 kg)  BMI 42.97 kg/m2  SpO2 97%  LMP 03/30/2015 Physical Exam  Constitutional: She is oriented to person, place, and time. She appears well-developed.  HENT:  Head: Normocephalic and atraumatic.  Mouth/Throat: No oropharyngeal exudate.  Tonsillar enlsrgement bilaterally, no exudates. No erythema, no drooling or trismus.  Eyes: EOM are normal.  Neck: Normal range of motion. Neck supple.  Cardiovascular: Normal rate.   Pulmonary/Chest: Effort normal. No respiratory distress. She has no  wheezes. She has no rales.  Abdominal: Bowel sounds are normal.  Lymphadenopathy:    She has cervical adenopathy.  Neurological: She is alert and oriented to person, place, and time.  Skin: Skin is warm and dry.  Nursing note and vitals reviewed.   ED Course  Procedures (including critical care time) Labs Review Labs Reviewed - No data to display  Imaging Review No results found. I have personally reviewed and evaluated these images and lab results as part of my medical  decision-making.   EKG Interpretation None      MDM   Final diagnoses:  Viral URI with cough  Acute pharyngitis, unspecified etiology    Pt comes in with cough, sore throat. No fevers. Non toxic. Exam is benign. Seems like a viral URI.    Derwood Kaplan, MD 04/30/15 [phone]

## 2015-04-30 NOTE — Discharge Instructions (Signed)
Upper Respiratory Infection, Adult °Most upper respiratory infections (URIs) are a viral infection of the air passages leading to the lungs. A URI affects the nose, throat, and upper air passages. The most common type of URI is nasopharyngitis and is typically referred to as "the common cold." °URIs run their course and usually go away on their own. Most of the time, a URI does not require medical attention, but sometimes a bacterial infection in the upper airways can follow a viral infection. This is called a secondary infection. Sinus and middle ear infections are common types of secondary upper respiratory infections. °Bacterial pneumonia can also complicate a URI. A URI can worsen asthma and chronic obstructive pulmonary disease (COPD). Sometimes, these complications can require emergency medical care and may be life threatening.  °CAUSES °Almost all URIs are caused by viruses. A virus is a type of germ and can spread from one person to another.  °RISKS FACTORS °You may be at risk for a URI if:  °· You smoke.   °· You have chronic heart or lung disease. °· You have a weakened defense (immune) system.   °· You are very young or very old.   °· You have nasal allergies or asthma. °· You work in crowded or poorly ventilated areas. °· You work in health care facilities or schools. °SIGNS AND SYMPTOMS  °Symptoms typically develop 2-3 days after you come in contact with a cold virus. Most viral URIs last 7-10 days. However, viral URIs from the influenza virus (flu virus) can last 14-18 days and are typically more severe. Symptoms may include:  °· Runny or stuffy (congested) nose.   °· Sneezing.   °· Cough.   °· Sore throat.   °· Headache.   °· Fatigue.   °· Fever.   °· Loss of appetite.   °· Pain in your forehead, behind your eyes, and over your cheekbones (sinus pain). °· Muscle aches.   °DIAGNOSIS  °Your health care provider may diagnose a URI by: °· Physical exam. °· Tests to check that your symptoms are not due to  another condition such as: °· Strep throat. °· Sinusitis. °· Pneumonia. °· Asthma. °TREATMENT  °A URI goes away on its own with time. It cannot be cured with medicines, but medicines may be prescribed or recommended to relieve symptoms. Medicines may help: °· Reduce your fever. °· Reduce your cough. °· Relieve nasal congestion. °HOME CARE INSTRUCTIONS  °· Take medicines only as directed by your health care provider.   °· Gargle warm saltwater or take cough drops to comfort your throat as directed by your health care provider. °· Use a warm mist humidifier or inhale steam from a shower to increase air moisture. This may make it easier to breathe. °· Drink enough fluid to keep your urine clear or pale yellow.   °· Eat soups and other clear broths and maintain good nutrition.   °· Rest as needed.   °· Return to work when your temperature has returned to normal or as your health care provider advises. You may need to stay home longer to avoid infecting others. You can also use a face mask and careful hand washing to prevent spread of the virus. °· Increase the usage of your inhaler if you have asthma.   °· Do not use any tobacco products, including cigarettes, chewing tobacco, or electronic cigarettes. If you need help quitting, ask your health care provider. °PREVENTION  °The best way to protect yourself from getting a cold is to practice good hygiene.  °· Avoid oral or hand contact with people with cold   symptoms.   °· Wash your hands often if contact occurs.   °There is no clear evidence that vitamin C, vitamin E, echinacea, or exercise reduces the chance of developing a cold. However, it is always recommended to get plenty of rest, exercise, and practice good nutrition.  °SEEK MEDICAL CARE IF:  °· You are getting worse rather than better.   °· Your symptoms are not controlled by medicine.   °· You have chills. °· You have worsening shortness of breath. °· You have brown or red mucus. °· You have yellow or brown nasal  discharge. °· You have pain in your face, especially when you bend forward. °· You have a fever. °· You have swollen neck glands. °· You have pain while swallowing. °· You have white areas in the back of your throat. °SEEK IMMEDIATE MEDICAL CARE IF:  °· You have severe or persistent: °¨ Headache. °¨ Ear pain. °¨ Sinus pain. °¨ Chest pain. °· You have chronic lung disease and any of the following: °¨ Wheezing. °¨ Prolonged cough. °¨ Coughing up blood. °¨ A change in your usual mucus. °· You have a stiff neck. °· You have changes in your: °¨ Vision. °¨ Hearing. °¨ Thinking. °¨ Mood. °MAKE SURE YOU:  °· Understand these instructions. °· Will watch your condition. °· Will get help right away if you are not doing well or get worse. °  °This information is not intended to replace advice given to you by your health care provider. Make sure you discuss any questions you have with your health care provider. °  °Document Released: 08/25/2000 Document Revised: 07/16/2014 Document Reviewed: 06/06/2013 °Elsevier Interactive Patient Education ©2016 [employer] ° °Cough, Adult °Coughing is a reflex that clears your throat and your airways. Coughing helps to heal and protect your lungs. It is normal to cough occasionally, but a cough that happens with other symptoms or lasts a long time may be a sign of a condition that needs treatment. A cough may last only 2-3 weeks (acute), or it may last longer than 8 weeks (chronic). °CAUSES °Coughing is commonly caused by: °· Breathing in substances that irritate your lungs. °· A viral or bacterial respiratory infection. °· Allergies. °· Asthma. °· Postnasal drip. °· Smoking. °· Acid backing up from the stomach into the esophagus (gastroesophageal reflux). °· Certain medicines. °· Chronic lung problems, including COPD (or rarely, lung cancer). °· Other medical conditions such as heart failure. °HOME CARE INSTRUCTIONS  °Pay attention to any changes in your symptoms. Take these actions to  help with your discomfort: °· Take medicines only as told by your health care provider. °¨ If you were prescribed an antibiotic medicine, take it as told by your health care provider. Do not stop taking the antibiotic even if you start to feel better. °¨ Talk with your health care provider before you take a cough suppressant medicine. °· Drink enough fluid to keep your urine clear or pale yellow. °· If the air is dry, use a cold steam vaporizer or humidifier in your bedroom or your home to help loosen secretions. °· Avoid anything that causes you to cough at work or at home. °· If your cough is worse at night, try sleeping in a semi-upright position. °· Avoid cigarette smoke. If you smoke, quit smoking. If you need help quitting, ask your health care provider. °· Avoid caffeine. °· Avoid alcohol. °· Rest as needed. °SEEK MEDICAL CARE IF:  °· You have new symptoms. °· You cough up pus. °· Your cough   does not get better after 2-3 weeks, or your cough gets worse.  You cannot control your cough with suppressant medicines and you are losing sleep.  You develop pain that is getting worse or pain that is not controlled with pain medicines.  You have a fever.  You have unexplained weight loss.  You have night sweats. SEEK IMMEDIATE MEDICAL CARE IF:  You cough up blood.  You have difficulty breathing.  Your heartbeat is very fast.   This information is not intended to replace advice given to you by your health care provider. Make sure you discuss any questions you have with your health care provider.   Document Released: 08/28/2010 Document Revised: 11/20/2014 Document Reviewed: 05/08/2014 Elsevier Interactive Patient Education 2016 [employer]  Viral Infections A viral infection can be caused by different types of viruses.Most viral infections are not serious and resolve on their own. However, some infections may cause severe symptoms and may lead to further complications. SYMPTOMS Viruses can  frequently cause:  Minor sore throat.  Aches and pains.  Headaches.  Runny nose.  Different types of rashes.  Watery eyes.  Tiredness.  Cough.  Loss of appetite.  Gastrointestinal infections, resulting in nausea, vomiting, and diarrhea. These symptoms do not respond to antibiotics because the infection is not caused by bacteria. However, you might catch a bacterial infection following the viral infection. This is sometimes called a "superinfection." Symptoms of such a bacterial infection may include:  Worsening sore throat with pus and difficulty swallowing.  Swollen neck glands.  Chills and a high or persistent fever.  Severe headache.  Tenderness over the sinuses.  Persistent overall ill feeling (malaise), muscle aches, and tiredness (fatigue).  Persistent cough.  Yellow, green, or brown mucus production with coughing. HOME CARE INSTRUCTIONS   Only take over-the-counter or prescription medicines for pain, discomfort, diarrhea, or fever as directed by your caregiver.  Drink enough water and fluids to keep your urine clear or pale yellow. Sports drinks can provide valuable electrolytes, sugars, and hydration.  Get plenty of rest and maintain proper nutrition. Soups and broths with crackers or rice are fine. SEEK IMMEDIATE MEDICAL CARE IF:   You have severe headaches, shortness of breath, chest pain, neck pain, or an unusual rash.  You have uncontrolled vomiting, diarrhea, or you are unable to keep down fluids.  You or your child has an oral temperature above 102 F (38.9 C), not controlled by medicine.  Your baby is older than 3 months with a rectal temperature of 102 F (38.9 C) or higher.  Your baby is 45 months old or younger with a rectal temperature of 100.4 F (38 C) or higher. MAKE SURE YOU:   Understand these instructions.  Will watch your condition.  Will get help right away if you are not doing well or get worse.   This information is not  intended to replace advice given to you by your health care provider. Make sure you discuss any questions you have with your health care provider.   Document Released: 12/09/2004 Document Revised: 05/24/2011 Document Reviewed: 08/07/2014 Elsevier Interactive Patient Education 2016 [employer] Pharyngitis Pharyngitis is redness, pain, and swelling (inflammation) of your pharynx.  CAUSES  Pharyngitis is usually caused by infection. Most of the time, these infections are from viruses (viral) and are part of a cold. However, sometimes pharyngitis is caused by bacteria (bacterial). Pharyngitis can also be caused by allergies. Viral pharyngitis may be spread from person to person by coughing, sneezing, and  personal items or utensils (cups, forks, spoons, toothbrushes). Bacterial pharyngitis may be spread from person to person by more intimate contact, such as kissing.  SIGNS AND SYMPTOMS  Symptoms of pharyngitis include:   Sore throat.   Tiredness (fatigue).   Low-grade fever.   Headache.  Joint pain and muscle aches.  Skin rashes.  Swollen lymph nodes.  Plaque-like film on throat or tonsils (often seen with bacterial pharyngitis). DIAGNOSIS  Your health care provider will ask you questions about your illness and your symptoms. Your medical history, along with a physical exam, is often all that is needed to diagnose pharyngitis. Sometimes, a rapid strep test is done. Other lab tests may also be done, depending on the suspected cause.  TREATMENT  Viral pharyngitis will usually get better in 3-4 days without the use of medicine. Bacterial pharyngitis is treated with medicines that kill germs (antibiotics).  HOME CARE INSTRUCTIONS   Drink enough water and fluids to keep your urine clear or pale yellow.   Only take over-the-counter or prescription medicines as directed by your health care provider:   If you are prescribed antibiotics, make sure you finish them even if you start to  feel better.   Do not take aspirin.   Get lots of rest.   Gargle with 8 oz of salt water ( tsp of salt per 1 qt of water) as often as every 1-2 hours to soothe your throat.   Throat lozenges (if you are not at risk for choking) or sprays may be used to soothe your throat. SEEK MEDICAL CARE IF:   You have large, tender lumps in your neck.  You have a rash.  You cough up green, yellow-brown, or bloody spit. SEEK IMMEDIATE MEDICAL CARE IF:   Your neck becomes stiff.  You drool or are unable to swallow liquids.  You vomit or are unable to keep medicines or liquids down.  You have severe pain that does not go away with the use of recommended medicines.  You have trouble breathing (not caused by a stuffy nose). MAKE SURE YOU:   Understand these instructions.  Will watch your condition.  Will get help right away if you are not doing well or get worse.   This information is not intended to replace advice given to you by your health care provider. Make sure you discuss any questions you have with your health care provider.   Document Released: 03/01/2005 Document Revised: 12/20/2012 Document Reviewed: 11/06/2012 Elsevier Interactive Patient Education [employer]

## 2015-04-30 NOTE — ED Notes (Signed)
Pt sitting up in chair holding pressure to bridge of nose.

## 2015-05-26 ENCOUNTER — Emergency Department (HOSPITAL_BASED_OUTPATIENT_CLINIC_OR_DEPARTMENT_OTHER): Payer: Medicare Other

## 2015-05-26 ENCOUNTER — Encounter (HOSPITAL_BASED_OUTPATIENT_CLINIC_OR_DEPARTMENT_OTHER): Payer: Self-pay | Admitting: Emergency Medicine

## 2015-05-26 ENCOUNTER — Emergency Department (HOSPITAL_BASED_OUTPATIENT_CLINIC_OR_DEPARTMENT_OTHER)
Admission: EM | Admit: 2015-05-26 | Discharge: 2015-05-26 | Disposition: A | Discharge status: Home / Self Care | Payer: Medicare Other | Attending: Emergency Medicine | Admitting: Emergency Medicine

## 2015-05-26 DIAGNOSIS — Z791 Long term (current) use of non-steroidal anti-inflammatories (NSAID): Secondary | ICD-10-CM | POA: Diagnosis not present

## 2015-05-26 DIAGNOSIS — Z87891 Personal history of nicotine dependence: Secondary | ICD-10-CM | POA: Diagnosis not present

## 2015-05-26 DIAGNOSIS — Z862 Personal history of diseases of the blood and blood-forming organs and certain disorders involving the immune mechanism: Secondary | ICD-10-CM | POA: Insufficient documentation

## 2015-05-26 DIAGNOSIS — R0789 Other chest pain: Secondary | ICD-10-CM | POA: Insufficient documentation

## 2015-05-26 DIAGNOSIS — R05 Cough: Secondary | ICD-10-CM | POA: Diagnosis present

## 2015-05-26 DIAGNOSIS — Z79899 Other long term (current) drug therapy: Secondary | ICD-10-CM | POA: Diagnosis not present

## 2015-05-26 DIAGNOSIS — J069 Acute upper respiratory infection, unspecified: Secondary | ICD-10-CM

## 2015-05-26 MED ORDER — ACETAMINOPHEN-CODEINE #3 300-30 MG PO TABS: 1.0000 | ORAL_TABLET | Freq: Four times a day (QID) | ORAL | Status: DC | PRN

## 2015-05-26 MED FILL — ACETAMINOPHEN/COD #3 TABLET: 300-30 | 2 days supply | Qty: 15 | Fill #0

## 2015-05-26 NOTE — ED Notes (Signed)
Onset this past Friday with left rib pain worsening with moving her arms, lifting her pants. Pain radiates to back. Denies trauma but states she has been having a non productive cough x 1 month.   Denies fever, shortness of breath, sweatiness, or other symptoms.

## 2015-05-26 NOTE — Discharge Instructions (Signed)
Ibuprofen 600 mg 3 times daily for the next 5 days.  Tylenol 3 as prescribed as needed for pain not relieved with ibuprofen.  Return to the ER symptoms significantly worsen or change.   Chest Wall Pain Chest wall pain is pain in or around the bones and muscles of your chest. Sometimes, an injury causes this pain. Sometimes, the cause may not be known. This pain may take several weeks or longer to get better. HOME CARE INSTRUCTIONS  Pay attention to any changes in your symptoms. Take these actions to help with your pain:   Rest as told by your health care provider.   Avoid activities that cause pain. These include any activities that use your chest muscles or your abdominal and side muscles to lift heavy items.   If directed, apply ice to the painful area:  Put ice in a plastic bag.  Place a towel between your skin and the bag.  Leave the ice on for 20 minutes, 2-3 times per day.  Take over-the-counter and prescription medicines only as told by your health care provider.  Do not use tobacco products, including cigarettes, chewing tobacco, and e-cigarettes. If you need help quitting, ask your health care provider.  Keep all follow-up visits as told by your health care provider. This is important. SEEK MEDICAL CARE IF:  You have a fever.  Your chest pain becomes worse.  You have new symptoms. SEEK IMMEDIATE MEDICAL CARE IF:  You have nausea or vomiting.  You feel sweaty or light-headed.  You have a cough with phlegm (sputum) or you cough up blood.  You develop shortness of breath.   This information is not intended to replace advice given to you by your health care provider. Make sure you discuss any questions you have with your health care provider.   Document Released: 03/01/2005 Document Revised: 11/20/2014 Document Reviewed: 05/27/2014 Elsevier Interactive Patient Education [employer]

## 2015-05-27 NOTE — ED Provider Notes (Signed)
CSN: [id number]     Arrival date & time 05/26/15  0932 History   First MD Initiated Contact with Patient 05/26/15 [phone]     Chief Complaint  Patient presents with  . Cough     (Consider location/radiation/quality/duration/timing/severity/associated sxs/prior Treatment) HPI Comments: Patient is a 30 year old female with past medical history of sickle cell disease. She presents today for evaluation of pain in her left chest wall. This started several days ago and is worsening. She does report having a nonproductive cough for the past several weeks, but no fevers or chills. Her pain is worse with palpation and movement and relieved with remaining still.  Patient is a 30 y.o. female presenting with cough. The history is provided by the patient.  Cough Cough characteristics:  Non-productive Severity:  Moderate Onset quality:  Gradual Duration:  4 weeks Timing:  Constant Progression:  Unchanged Chronicity:  New Smoker: no   Relieved by:  Nothing Worsened by:  Nothing tried   Past Medical History  Diagnosis Date  . Sickle cell anemia [facility](HCC)    Past Surgical History  Procedure Laterality Date  . Cholecystectomy    . Portacath placement     History reviewed. No pertinent family history. Social History  Substance Use Topics  . Smoking status: Former Games developermoker  . Smokeless tobacco: Never Used  . Alcohol Use: Yes     Comment: occasion   OB History    No data available     Review of Systems  Respiratory: Positive for cough.   All other systems reviewed and are negative.     Allergies  Review of patient's allergies indicates no known allergies.  Home Medications   Prior to Admission medications   Medication Sig Start Date End Date Taking? Authorizing Provider  deferasirox (EXJADE) 500 MG disintegrating tablet Take 500 mg by mouth daily before breakfast.   Yes Historical Provider, MD  hydroxyurea (HYDREA) 500 MG capsule Take 500 mg by mouth daily. May take with food to  minimize GI side effects.   Yes Historical Provider, MD  acetaminophen-codeine (TYLENOL #3) 300-30 MG tablet Take 1-2 tablets by mouth every 6 (six) hours as needed for moderate pain. 05/26/15   Geoffery Lyonsouglas Hanah Moultry, MD  benzonatate (TESSALON) 100 MG capsule Take 1 capsule (100 mg total) by mouth every 8 (eight) hours. 04/30/15   Derwood KaplanAnkit Nanavati, MD  Docosanol (ABREVA) 10 % CREA Apply to affected area twice daily. 08/07/14   Robyn M Hess, PA-C  naproxen (NAPROSYN) 500 MG tablet Take 1 tablet (500 mg total) by mouth 2 (two) times daily. 04/30/15   Ankit Rhunette CroftNanavati, MD   BP 149/59 mmHg  Pulse 81  Temp(Src) 97.9 F (36.6 C) (Oral)  Resp 18  Ht 5' (1.524 m)  Wt 215 lb (97.523 kg)  BMI 41.99 kg/m2  SpO2 99%  LMP 05/26/2015 Physical Exam  Constitutional: She is oriented to person, place, and time. She appears well-developed and well-nourished. No distress.  HENT:  Head: Normocephalic and atraumatic.  Neck: Normal range of motion. Neck supple.  Cardiovascular: Normal rate and regular rhythm.  Exam reveals no gallop and no friction rub.   No murmur heard. Pulmonary/Chest: Effort normal and breath sounds normal. No respiratory distress. She has no wheezes. She has no rales. She exhibits tenderness.  There is tenderness to palpation of the left lateral chest wall. There is no palpable abnormality or crepitus.  Abdominal: Soft. Bowel sounds are normal. She exhibits no distension. There is no tenderness.  Musculoskeletal: Normal range of  motion.  Neurological: She is alert and oriented to person, place, and time.  Skin: Skin is warm and dry. She is not diaphoretic.  Nursing note and vitals reviewed.   ED Course  Procedures (including critical care time) Labs Review Labs Reviewed - No data to display  Imaging Review Dg Chest 2 View  05/26/2015  CLINICAL DATA:  Onset of rib pain this past Friday worsened with arm movement, pain radiating to back, nonproductive cough for 1 month, history sickle cell  disease, former smoker EXAM: CHEST  2 VIEW COMPARISON:  05/15/2013 FINDINGS: RIGHT jugular Port-A-Cath with tip projecting over SVC. Enlargement of cardiac silhouette with pulmonary vascular congestion. Mediastinal contours normal. Minimal chronic RIGHT basilar atelectasis. No infiltrate, pleural effusion or pneumothorax. No acute osseous findings. IMPRESSION: Enlargement of cardiac silhouette with pulmonary vascular congestion consistent with history of sickle cell disease. Minimal chronic RIGHT basilar atelectasis without acute infiltrate. Electronically Signed   By: Ulyses Southward M.D.   On: 05/26/2015 10:31   I have personally reviewed and evaluated these images and lab results as part of my medical decision-making.    MDM   Final diagnoses:  URI (upper respiratory infection)  Chest wall pain    Patient symptoms are most likely musculoskeletal in nature. Her symptoms are easily reproducible with palpation of the left lateral chest wall. Her chest x-ray is clear. She appears nontoxic and is afebrile. I highly doubt a sickle crisis or acute chest. She will be treated with anti-inflammatories and Tylenol 3. To return as needed for any problems.    Geoffery Lyons, MD 05/27/15 [phone]

## 2015-11-25 ENCOUNTER — Encounter (HOSPITAL_BASED_OUTPATIENT_CLINIC_OR_DEPARTMENT_OTHER): Payer: Self-pay | Admitting: Emergency Medicine

## 2015-11-25 ENCOUNTER — Emergency Department (HOSPITAL_BASED_OUTPATIENT_CLINIC_OR_DEPARTMENT_OTHER): Payer: Medicare Other

## 2015-11-25 ENCOUNTER — Inpatient Hospital Stay (HOSPITAL_BASED_OUTPATIENT_CLINIC_OR_DEPARTMENT_OTHER)
Admission: EM | Admit: 2015-11-25 | Discharge: 2015-11-29 | DRG: 811 | Disposition: A | Discharge status: Home / Self Care | Payer: Medicare Other | Attending: Internal Medicine | Admitting: Internal Medicine

## 2015-11-25 DIAGNOSIS — R0602 Shortness of breath: Secondary | ICD-10-CM

## 2015-11-25 DIAGNOSIS — Z791 Long term (current) use of non-steroidal anti-inflammatories (NSAID): Secondary | ICD-10-CM

## 2015-11-25 DIAGNOSIS — R791 Abnormal coagulation profile: Secondary | ICD-10-CM | POA: Diagnosis present

## 2015-11-25 DIAGNOSIS — I639 Cerebral infarction, unspecified: Secondary | ICD-10-CM | POA: Diagnosis present

## 2015-11-25 DIAGNOSIS — D5701 Hb-SS disease with acute chest syndrome: Principal | ICD-10-CM | POA: Diagnosis present

## 2015-11-25 DIAGNOSIS — R0902 Hypoxemia: Secondary | ICD-10-CM | POA: Diagnosis present

## 2015-11-25 DIAGNOSIS — D649 Anemia, unspecified: Secondary | ICD-10-CM

## 2015-11-25 DIAGNOSIS — E860 Dehydration: Secondary | ICD-10-CM | POA: Diagnosis present

## 2015-11-25 DIAGNOSIS — M546 Pain in thoracic spine: Secondary | ICD-10-CM | POA: Diagnosis present

## 2015-11-25 DIAGNOSIS — R17 Unspecified jaundice: Secondary | ICD-10-CM | POA: Diagnosis present

## 2015-11-25 DIAGNOSIS — E871 Hypo-osmolality and hyponatremia: Secondary | ICD-10-CM | POA: Diagnosis present

## 2015-11-25 DIAGNOSIS — D571 Sickle-cell disease without crisis: Secondary | ICD-10-CM

## 2015-11-25 DIAGNOSIS — Z8673 Personal history of transient ischemic attack (TIA), and cerebral infarction without residual deficits: Secondary | ICD-10-CM

## 2015-11-25 DIAGNOSIS — R000 Tachycardia, unspecified: Secondary | ICD-10-CM | POA: Diagnosis present

## 2015-11-25 DIAGNOSIS — D72829 Elevated white blood cell count, unspecified: Secondary | ICD-10-CM

## 2015-11-25 DIAGNOSIS — Y92481 Parking lot as the place of occurrence of the external cause: Secondary | ICD-10-CM

## 2015-11-25 DIAGNOSIS — D599 Acquired hemolytic anemia, unspecified: Secondary | ICD-10-CM | POA: Diagnosis present

## 2015-11-25 DIAGNOSIS — Z9114 Patient's other noncompliance with medication regimen: Secondary | ICD-10-CM

## 2015-11-25 DIAGNOSIS — R748 Abnormal levels of other serum enzymes: Secondary | ICD-10-CM

## 2015-11-25 DIAGNOSIS — T8089XA Other complications following infusion, transfusion and therapeutic injection, initial encounter: Secondary | ICD-10-CM | POA: Diagnosis present

## 2015-11-25 DIAGNOSIS — R7989 Other specified abnormal findings of blood chemistry: Secondary | ICD-10-CM | POA: Diagnosis present

## 2015-11-25 DIAGNOSIS — J189 Pneumonia, unspecified organism: Secondary | ICD-10-CM | POA: Diagnosis present

## 2015-11-25 DIAGNOSIS — Z87891 Personal history of nicotine dependence: Secondary | ICD-10-CM

## 2015-11-25 DIAGNOSIS — Z79899 Other long term (current) drug therapy: Secondary | ICD-10-CM

## 2015-11-25 DIAGNOSIS — E8771 Transfusion associated circulatory overload: Secondary | ICD-10-CM | POA: Diagnosis present

## 2015-11-25 HISTORY — DX: Cerebral infarction, unspecified: I63.9

## 2015-11-25 MED ORDER — OXYCODONE-ACETAMINOPHEN 5-325 MG PO TABS
1.0000 | ORAL_TABLET | Freq: Once | ORAL | Status: AC
  Administered 2015-11-25: 1 via ORAL
  Filled 2015-11-25: qty 1

## 2015-11-25 MED ORDER — METHOCARBAMOL 500 MG PO TABS
1000.0000 mg | ORAL_TABLET | Freq: Once | ORAL | Status: AC
  Administered 2015-11-25: 1000 mg via ORAL
  Filled 2015-11-25: qty 2

## 2015-11-25 NOTE — ED Notes (Signed)
Pt. Transported to xray 
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
## 2015-11-25 NOTE — ED Notes (Signed)
MD with pt  
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
## 2015-11-26 ENCOUNTER — Emergency Department (HOSPITAL_BASED_OUTPATIENT_CLINIC_OR_DEPARTMENT_OTHER): Payer: Medicare Other

## 2015-11-26 ENCOUNTER — Encounter (HOSPITAL_COMMUNITY): Payer: Self-pay | Admitting: *Deleted

## 2015-11-26 ENCOUNTER — Inpatient Hospital Stay (HOSPITAL_COMMUNITY): Payer: Medicare Other

## 2015-11-26 DIAGNOSIS — R17 Unspecified jaundice: Secondary | ICD-10-CM

## 2015-11-26 DIAGNOSIS — R740 Nonspecific elevation of levels of transaminase and lactic acid dehydrogenase [LDH]: Secondary | ICD-10-CM | POA: Diagnosis not present

## 2015-11-26 DIAGNOSIS — R0902 Hypoxemia: Secondary | ICD-10-CM | POA: Diagnosis present

## 2015-11-26 DIAGNOSIS — Z79899 Other long term (current) drug therapy: Secondary | ICD-10-CM | POA: Diagnosis not present

## 2015-11-26 DIAGNOSIS — J189 Pneumonia, unspecified organism: Secondary | ICD-10-CM | POA: Diagnosis present

## 2015-11-26 DIAGNOSIS — R9431 Abnormal electrocardiogram [ECG] [EKG]: Secondary | ICD-10-CM | POA: Diagnosis not present

## 2015-11-26 DIAGNOSIS — R791 Abnormal coagulation profile: Secondary | ICD-10-CM | POA: Diagnosis present

## 2015-11-26 DIAGNOSIS — T8089XA Other complications following infusion, transfusion and therapeutic injection, initial encounter: Secondary | ICD-10-CM | POA: Diagnosis present

## 2015-11-26 DIAGNOSIS — D72829 Elevated white blood cell count, unspecified: Secondary | ICD-10-CM

## 2015-11-26 DIAGNOSIS — E8771 Transfusion associated circulatory overload: Secondary | ICD-10-CM | POA: Diagnosis present

## 2015-11-26 DIAGNOSIS — R7989 Other specified abnormal findings of blood chemistry: Secondary | ICD-10-CM | POA: Diagnosis present

## 2015-11-26 DIAGNOSIS — D5700 Hb-SS disease with crisis, unspecified: Secondary | ICD-10-CM

## 2015-11-26 DIAGNOSIS — E871 Hypo-osmolality and hyponatremia: Secondary | ICD-10-CM | POA: Diagnosis present

## 2015-11-26 DIAGNOSIS — Z9114 Patient's other noncompliance with medication regimen: Secondary | ICD-10-CM | POA: Diagnosis not present

## 2015-11-26 DIAGNOSIS — D649 Anemia, unspecified: Secondary | ICD-10-CM | POA: Diagnosis not present

## 2015-11-26 DIAGNOSIS — R000 Tachycardia, unspecified: Secondary | ICD-10-CM | POA: Diagnosis present

## 2015-11-26 DIAGNOSIS — M546 Pain in thoracic spine: Secondary | ICD-10-CM | POA: Diagnosis present

## 2015-11-26 DIAGNOSIS — D5701 Hb-SS disease with acute chest syndrome: Secondary | ICD-10-CM | POA: Diagnosis present

## 2015-11-26 DIAGNOSIS — R0602 Shortness of breath: Secondary | ICD-10-CM | POA: Diagnosis not present

## 2015-11-26 DIAGNOSIS — Z8673 Personal history of transient ischemic attack (TIA), and cerebral infarction without residual deficits: Secondary | ICD-10-CM | POA: Diagnosis not present

## 2015-11-26 DIAGNOSIS — Z87891 Personal history of nicotine dependence: Secondary | ICD-10-CM | POA: Diagnosis not present

## 2015-11-26 DIAGNOSIS — Z791 Long term (current) use of non-steroidal anti-inflammatories (NSAID): Secondary | ICD-10-CM | POA: Diagnosis not present

## 2015-11-26 DIAGNOSIS — E860 Dehydration: Secondary | ICD-10-CM | POA: Diagnosis present

## 2015-11-26 DIAGNOSIS — D599 Acquired hemolytic anemia, unspecified: Secondary | ICD-10-CM | POA: Diagnosis present

## 2015-11-26 DIAGNOSIS — D571 Sickle-cell disease without crisis: Secondary | ICD-10-CM

## 2015-11-26 DIAGNOSIS — Y92481 Parking lot as the place of occurrence of the external cause: Secondary | ICD-10-CM | POA: Diagnosis not present

## 2015-11-26 LAB — CBC WITH DIFFERENTIAL/PLATELET
BASOS ABS: 0 10*3/uL (ref 0.0–0.1)
BASOS ABS: 0 10*3/uL (ref 0.0–0.1)
Basophils Relative: 0 %
Basophils Relative: 0 %
EOS PCT: 1 %
EOS PCT: 1 %
Eosinophils Absolute: 0.3 10*3/uL (ref 0.0–0.7)
Eosinophils Absolute: 0.3 10*3/uL (ref 0.0–0.7)
HCT: 19 % — ABNORMAL LOW (ref 36.0–46.0)
HEMATOCRIT: 20.6 % — AB (ref 36.0–46.0)
Hemoglobin: 6.7 g/dL — CL (ref 12.0–15.0)
Hemoglobin: 7.4 g/dL — ABNORMAL LOW (ref 12.0–15.0)
LYMPHS ABS: 8.4 10*3/uL — AB (ref 0.7–4.0)
LYMPHS PCT: 30 %
Lymphocytes Relative: 26 %
Lymphs Abs: 9.4 10*3/uL — ABNORMAL HIGH (ref 0.7–4.0)
MCH: 37.6 pg — ABNORMAL HIGH (ref 26.0–34.0)
MCH: 37.9 pg — ABNORMAL HIGH (ref 26.0–34.0)
MCHC: 35.3 g/dL (ref 30.0–36.0)
MCHC: 35.9 g/dL (ref 30.0–36.0)
MCV: 106.7 fL — ABNORMAL HIGH (ref 78.0–100.0)
MCV: 105.6 fL — AB (ref 78.0–100.0)
MONO ABS: 3.1 10*3/uL — AB (ref 0.1–1.0)
MONOS PCT: 10 %
MONOS PCT: 10 %
Monocytes Absolute: 3.2 10*3/uL — ABNORMAL HIGH (ref 0.1–1.0)
NEUTROS ABS: 20.4 10*3/uL — AB (ref 1.7–7.7)
NEUTROS PCT: 59 %
Neutro Abs: 18.6 10*3/uL — ABNORMAL HIGH (ref 1.7–7.7)
Neutrophils Relative %: 63 %
Platelets: 341 10*3/uL (ref 150–400)
Platelets: 409 10*3/uL — ABNORMAL HIGH (ref 150–400)
RBC: 1.95 MIL/uL — ABNORMAL LOW (ref 3.87–5.11)
RBC: 1.78 MIL/uL — AB (ref 3.87–5.11)
RDW: 17.7 % — ABNORMAL HIGH (ref 11.5–15.5)
RDW: 17.8 % — AB (ref 11.5–15.5)
WBC: 32.3 10*3/uL — ABNORMAL HIGH (ref 4.0–10.5)
WBC: 31.4 10*3/uL — AB (ref 4.0–10.5)

## 2015-11-26 LAB — D-DIMER, QUANTITATIVE: D-Dimer, Quant: 1.7 ug/mL-FEU — ABNORMAL HIGH (ref 0.00–0.50)

## 2015-11-26 LAB — LACTATE DEHYDROGENASE: LDH: 492 U/L — ABNORMAL HIGH (ref 98–192)

## 2015-11-26 LAB — COMPREHENSIVE METABOLIC PANEL
ALBUMIN: 3.6 g/dL (ref 3.5–5.0)
ALT: 78 U/L — AB (ref 14–54)
ANION GAP: 4 — AB (ref 5–15)
AST: 86 U/L — ABNORMAL HIGH (ref 15–41)
Alkaline Phosphatase: 128 U/L — ABNORMAL HIGH (ref 38–126)
BUN: 9 mg/dL (ref 6–20)
CALCIUM: 12.6 mg/dL — AB (ref 8.9–10.3)
CO2: 24 mmol/L (ref 22–32)
CREATININE: 0.36 mg/dL — AB (ref 0.44–1.00)
Chloride: 103 mmol/L (ref 101–111)
GFR calc Af Amer: >60 mL/min (ref >60)
GFR calc non Af Amer: >60 mL/min (ref >60)
GLUCOSE: 87 mg/dL (ref 65–99)
Potassium: 4.3 mmol/L (ref 3.5–5.1)
SODIUM: 131 mmol/L — AB (ref 135–145)
Total Bilirubin: 10 mg/dL — ABNORMAL HIGH (ref 0.3–1.2)
Total Protein: 8.4 g/dL — ABNORMAL HIGH (ref 6.5–8.1)

## 2015-11-26 LAB — URINALYSIS, ROUTINE W REFLEX MICROSCOPIC
Glucose, UA: NEGATIVE mg/dL
Ketones, ur: NEGATIVE mg/dL
NITRITE: NEGATIVE
Protein, ur: 30 mg/dL — AB
SPECIFIC GRAVITY, URINE: 1.013 (ref 1.005–1.030)
pH: 5.5 (ref 5.0–8.0)

## 2015-11-26 LAB — ABO/RH: ABO/RH(D): B POS

## 2015-11-26 LAB — RETICULOCYTES
RBC.: 1.95 MIL/uL — ABNORMAL LOW (ref 3.87–5.11)
RBC.: 1.77 MIL/uL — AB (ref 3.87–5.11)
Retic Ct Pct: >23 % — ABNORMAL HIGH (ref 0.4–3.1)
Retic Ct Pct: >23 % — ABNORMAL HIGH (ref 0.4–3.1)

## 2015-11-26 LAB — URINE MICROSCOPIC-ADD ON

## 2015-11-26 LAB — STREP PNEUMONIAE URINARY ANTIGEN: Strep Pneumo Urinary Antigen: NEGATIVE

## 2015-11-26 LAB — HIV ANTIBODY (ROUTINE TESTING W REFLEX): HIV SCREEN 4TH GENERATION: NONREACTIVE

## 2015-11-26 LAB — PREGNANCY, URINE: PREG TEST UR: NEGATIVE

## 2015-11-26 MED ORDER — SODIUM CHLORIDE 0.9 % IV SOLN
INTRAVENOUS | Status: DC
  Administered 2015-11-26: 06:00:00 via INTRAVENOUS

## 2015-11-26 MED ORDER — HYDROXYUREA 500 MG PO CAPS: 1500.0000 mg | ORAL_CAPSULE | ORAL | Status: DC

## 2015-11-26 MED ORDER — HYDROXYUREA 500 MG PO CAPS
2000.0000 mg | ORAL_CAPSULE | ORAL | Status: DC
  Administered 2015-11-27 – 2015-11-29 (×2): 2000 mg via ORAL
  Filled 2015-11-26 (×3): qty 4

## 2015-11-26 MED ORDER — DEFERASIROX 90 MG PO TABS: 90.0000 mg | ORAL_TABLET | Freq: Every morning | ORAL | Status: DC

## 2015-11-26 MED ORDER — OXYCODONE-ACETAMINOPHEN 5-325 MG PO TABS: 1.0000 | ORAL_TABLET | ORAL | Status: DC | PRN

## 2015-11-26 MED ORDER — DEXTROSE 5 % IV SOLN
500.0000 mg | Freq: Once | INTRAVENOUS | Status: AC
  Administered 2015-11-26: 500 mg via INTRAVENOUS

## 2015-11-26 MED ORDER — OXYCODONE HCL 5 MG PO TABS
5.0000 mg | ORAL_TABLET | ORAL | Status: DC | PRN
  Administered 2015-11-26 – 2015-11-28 (×4): 5 mg via ORAL
  Filled 2015-11-26 (×4): qty 1

## 2015-11-26 MED ORDER — DEXTROSE 5 % IV SOLN
1.0000 g | INTRAVENOUS | Status: DC
  Administered 2015-11-27 – 2015-11-28 (×2): 1 g via INTRAVENOUS
  Filled 2015-11-26 (×2): qty 10

## 2015-11-26 MED ORDER — ALTEPLASE 2 MG IJ SOLR
2.0000 mg | Freq: Once | INTRAMUSCULAR | Status: AC
  Administered 2015-11-26: 2 mg
  Filled 2015-11-26: qty 2

## 2015-11-26 MED ORDER — HYDROXYUREA 500 MG PO CAPS
1500.0000 mg | ORAL_CAPSULE | ORAL | Status: DC
  Administered 2015-11-26 – 2015-11-28 (×2): 1500 mg via ORAL
  Filled 2015-11-26 (×3): qty 3

## 2015-11-26 MED ORDER — SODIUM CHLORIDE 0.9% FLUSH
10.0000 mL | INTRAVENOUS | Status: DC | PRN
  Administered 2015-11-26 – 2015-11-29 (×3): 10 mL
  Filled 2015-11-26 (×3): qty 40

## 2015-11-26 MED ORDER — BENZONATATE 100 MG PO CAPS: 100.0000 mg | ORAL_CAPSULE | Freq: Three times a day (TID) | ORAL | Status: DC | PRN

## 2015-11-26 MED ORDER — STERILE WATER FOR INJECTION IJ SOLN
INTRAMUSCULAR | Status: AC
  Filled 2015-11-26: qty 10

## 2015-11-26 MED ORDER — DEXTROSE 5 % IV SOLN
500.0000 mg | INTRAVENOUS | Status: DC
  Administered 2015-11-27 – 2015-11-28 (×2): 500 mg via INTRAVENOUS
  Filled 2015-11-26 (×2): qty 500

## 2015-11-26 MED ORDER — DEXTROSE 5 % IV SOLN
1.0000 g | Freq: Once | INTRAVENOUS | Status: AC
  Administered 2015-11-26: 1 g via INTRAVENOUS
  Filled 2015-11-26: qty 10

## 2015-11-26 MED ORDER — FOLIC ACID 1 MG PO TABS
1.0000 mg | ORAL_TABLET | Freq: Every day | ORAL | Status: DC
  Administered 2015-11-26 – 2015-11-29 (×4): 1 mg via ORAL
  Filled 2015-11-26 (×5): qty 1

## 2015-11-26 MED ORDER — SODIUM CHLORIDE 0.9 % IV BOLUS (SEPSIS)
1000.0000 mL | Freq: Once | INTRAVENOUS | Status: AC
  Administered 2015-11-26: 1000 mL via INTRAVENOUS

## 2015-11-26 MED ORDER — AZITHROMYCIN 500 MG IV SOLR
INTRAVENOUS | Status: AC
  Administered 2015-11-26: 06:00:00
  Filled 2015-11-26: qty 500

## 2015-11-26 MED ORDER — HYDROXYUREA 500 MG PO CAPS: 500.0000 mg | ORAL_CAPSULE | Freq: Every day | ORAL | Status: DC

## 2015-11-26 MED ORDER — OXYCODONE-ACETAMINOPHEN 5-325 MG PO TABS
1.0000 | ORAL_TABLET | Freq: Once | ORAL | Status: AC
  Administered 2015-11-26: 1 via ORAL
  Filled 2015-11-26: qty 1

## 2015-11-26 MED ORDER — IOPAMIDOL (ISOVUE-370) INJECTION 76%: 100.0000 mL | Freq: Once | INTRAVENOUS | Status: DC | PRN

## 2015-11-26 MED ORDER — BENZONATATE 100 MG PO CAPS
100.0000 mg | ORAL_CAPSULE | Freq: Three times a day (TID) | ORAL | Status: DC
  Administered 2015-11-26: 100 mg via ORAL
  Filled 2015-11-26: qty 1

## 2015-11-26 MED ORDER — HYDROXYUREA 500 MG PO CAPS: 2000.0000 mg | ORAL_CAPSULE | ORAL | Status: DC

## 2015-11-26 MED ORDER — SODIUM CHLORIDE 0.9 % IV SOLN
Freq: Once | INTRAVENOUS | Status: AC
  Administered 2015-11-27: 07:00:00 via INTRAVENOUS
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
## 2015-11-26 NOTE — Progress Notes (Signed)
[facility] with Dr. Ranae PalmsYelverton Ms. Pamela LittlerKinsler is a 30 year old female with past medical history significant for sickle cell disease who presents with c/o right-sided thoracic back pain. VS temp 99.20F, HR up to 111, RR 18, O2 sats 91-100%. Lab work WBC 31.4, hemoglobin 6.7(baseline 8), chest x-ray showing right infiltrate. Antibiotics of ceftriaxone and azithromycin started. Patient only has port access at this time. Discussed need of secondary access with Dr. Ranae PalmsYelverton who will try for EJ  prior to transport. Patient will likely need transfusion of packed red blood cells. [facility].

## 2015-11-26 NOTE — Progress Notes (Signed)
Rechecked pt's vitals, rr remain at 26 O2 is 100 on 3L pt states she is breathing better with the oxygen on. Leonard Hendler R McClean
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
## 2015-11-26 NOTE — ED Notes (Signed)
portacath in right chest wall access with 20g 1 inch power port needle. Blood return noted and port flushed with saline.

## 2015-11-26 NOTE — Progress Notes (Signed)
**  Preliminary report by tech**  Bilateral lower extremity venous duplex completed. There is no evidence of deep or superficial vein thrombosis involving the right and left lower extremities. All visualized vessels appear patent and compressible. There is no evidence of Baker's cysts bilaterally.  11/26/15 10:09 AM Olen CordialGreg Birtie Fellman RVT

## 2015-11-26 NOTE — ED Notes (Signed)
[facility]

## 2015-11-26 NOTE — ED Notes (Signed)
Pt transported to [facility]. Stable at transfer.

## 2015-11-26 NOTE — ED Notes (Signed)
Waldo LaineKellie Neal, RN obtained [facility] times one out of porta cath.

## 2015-11-26 NOTE — ED Notes (Addendum)
Pt's porta cath is not a power port. Will update MD. Attempted IV access times 2 on pt's arrival to ED.

## 2015-11-26 NOTE — ED Notes (Signed)
Report given to [facility] 

## 2015-11-26 NOTE — ED Notes (Signed)
Paged [facility] hospitalist

## 2015-11-26 NOTE — ED Notes (Signed)
See paper charting during epic downtime.  

## 2015-11-26 NOTE — ED Notes (Signed)
MD attempt times one for u/s guided IV without success. CT on hold at this time.

## 2015-11-26 NOTE — ED Notes (Addendum)
Pt sinus/ST on monitor. 02 sats are around 90-92% on RA. 02 placed at 2l via n/c. 02 sats increased to 96%. Pt denies any sob or cp. Lungs decreased right lower

## 2015-11-26 NOTE — Progress Notes (Signed)
Patient ambulatory o2 sats sustained in the upper 90's during ambulation.  HR sustaining in the 120's-130's while walking.

## 2015-11-26 NOTE — Progress Notes (Signed)
PHARMACY NOTE -  ANTIBIOTIC RENAL DOSE ADJUSTMENT    Request received for Pharmacy to assist with antibiotic renal dose adjustment.   Patient has been initiated on Ceftriaxone and Azithromycin for CAP.  Renal adjustment not required for either drug  Will sign off at this time.  Please reconsult if a change in clinical status warrants re-evaluation of dosage.  Bernadene Personrew Aliyana Dlugosz, PharmD, BCPS Pager: [phone] 11/26/2015, 8:10 AM

## 2015-11-26 NOTE — ED Notes (Signed)
Pt c/o increase in back pain. Rates 8/10. MD aware and orders received.

## 2015-11-27 DIAGNOSIS — E871 Hypo-osmolality and hyponatremia: Secondary | ICD-10-CM

## 2015-11-27 DIAGNOSIS — D5701 Hb-SS disease with acute chest syndrome: Principal | ICD-10-CM

## 2015-11-27 DIAGNOSIS — D72829 Elevated white blood cell count, unspecified: Secondary | ICD-10-CM

## 2015-11-27 DIAGNOSIS — D594 Other nonautoimmune hemolytic anemias: Secondary | ICD-10-CM

## 2015-11-27 DIAGNOSIS — R000 Tachycardia, unspecified: Secondary | ICD-10-CM

## 2015-11-27 LAB — CBC WITH DIFFERENTIAL/PLATELET
BASOS PCT: 0 %
Basophils Absolute: 0 10*3/uL (ref 0.0–0.1)
EOS ABS: 0.6 10*3/uL (ref 0.0–0.7)
EOS PCT: 2 %
HCT: 19.9 % — ABNORMAL LOW (ref 36.0–46.0)
HEMOGLOBIN: 7.1 g/dL — AB (ref 12.0–15.0)
Lymphocytes Relative: 31 %
Lymphs Abs: 9.6 10*3/uL — ABNORMAL HIGH (ref 0.7–4.0)
MCH: 37.4 pg — AB (ref 26.0–34.0)
MCHC: 35.7 g/dL (ref 30.0–36.0)
MCV: 104.7 fL — AB (ref 78.0–100.0)
MONO ABS: 3.4 10*3/uL — AB (ref 0.1–1.0)
Monocytes Relative: 11 %
NEUTROS ABS: 17.5 10*3/uL — AB (ref 1.7–7.7)
Neutrophils Relative %: 56 %
PLATELETS: 368 10*3/uL (ref 150–400)
RBC: 1.9 MIL/uL — ABNORMAL LOW (ref 3.87–5.11)
RDW: 16.8 % — ABNORMAL HIGH (ref 11.5–15.5)
WBC: 31.1 10*3/uL — ABNORMAL HIGH (ref 4.0–10.5)

## 2015-11-27 LAB — COMPREHENSIVE METABOLIC PANEL
ALBUMIN: 3.6 g/dL (ref 3.5–5.0)
ALK PHOS: 129 U/L — AB (ref 38–126)
ALT: 73 U/L — AB (ref 14–54)
AST: 81 U/L — AB (ref 15–41)
Anion gap: 6 (ref 5–15)
BUN: 10 mg/dL (ref 6–20)
CALCIUM: 13 mg/dL — AB (ref 8.9–10.3)
CHLORIDE: 103 mmol/L (ref 101–111)
CO2: 25 mmol/L (ref 22–32)
CREATININE: 0.56 mg/dL (ref 0.44–1.00)
GFR calc Af Amer: >60 mL/min (ref >60)
GFR calc non Af Amer: >60 mL/min (ref >60)
GLUCOSE: 104 mg/dL — AB (ref 65–99)
Potassium: 4.4 mmol/L (ref 3.5–5.1)
SODIUM: 134 mmol/L — AB (ref 135–145)
Total Bilirubin: 7.8 mg/dL — ABNORMAL HIGH (ref 0.3–1.2)
Total Protein: 8.6 g/dL — ABNORMAL HIGH (ref 6.5–8.1)

## 2015-11-27 LAB — PREPARE RBC (CROSSMATCH)

## 2015-11-27 LAB — LACTATE DEHYDROGENASE: LDH: 362 U/L — AB (ref 98–192)

## 2015-11-27 MED ORDER — ENOXAPARIN SODIUM 40 MG/0.4ML ~~LOC~~ SOLN
40.0000 mg | SUBCUTANEOUS | Status: DC
  Administered 2015-11-27 – 2015-11-28 (×2): 40 mg via SUBCUTANEOUS
  Filled 2015-11-27: qty 0.4

## 2015-11-27 MED ORDER — DEFERASIROX 360 MG PO TABS
1080.0000 mg | ORAL_TABLET | Freq: Every day | ORAL | Status: DC
  Administered 2015-11-27: 1080 mg via ORAL
  Administered 2015-11-28: 1080 via ORAL
  Administered 2015-11-29: 1080 mg via ORAL

## 2015-11-27 MED ORDER — ACETAMINOPHEN 325 MG PO TABS
650.0000 mg | ORAL_TABLET | Freq: Once | ORAL | Status: AC
  Administered 2015-11-27: 650 mg via ORAL
  Filled 2015-11-27: qty 2

## 2015-11-27 MED ORDER — SODIUM CHLORIDE 0.9 % IV SOLN: Freq: Once | INTRAVENOUS | Status: DC

## 2015-11-27 MED ORDER — DIPHENHYDRAMINE HCL 25 MG PO CAPS
25.0000 mg | ORAL_CAPSULE | Freq: Once | ORAL | Status: AC
Start: 2015-11-27 — End: 2015-11-27
  Administered 2015-11-27: 25 mg via ORAL
  Filled 2015-11-27: qty 1
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
## 2015-11-27 NOTE — Care Management Note (Signed)
Case Management Note  Patient Details  Name: Pamela Hudson MRN: [id number] Date of Birth: 1986/01/06  Subjective/Objective:30 y/o f admitted w/PNA. From home.                    Action/Plan:d/c plan home.   Expected Discharge Date:                  Expected Discharge Plan:  Home/Self Care  In-House Referral:     Discharge planning Services  CM Consult  Post Acute Care Choice:    Choice offered to:     DME Arranged:    DME Agency:     HH Arranged:    HH Agency:     Status of Service:  In process, will continue to follow  If discussed at Long Length of Stay Meetings, dates discussed:    Additional Comments:  Pamela Hudson, Pamela Giovannini, RN 11/27/2015, 1:12 PM
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
## 2015-11-28 ENCOUNTER — Inpatient Hospital Stay (HOSPITAL_COMMUNITY): Payer: Medicare Other

## 2015-11-28 DIAGNOSIS — R9431 Abnormal electrocardiogram [ECG] [EKG]: Secondary | ICD-10-CM

## 2015-11-28 DIAGNOSIS — D649 Anemia, unspecified: Secondary | ICD-10-CM

## 2015-11-28 LAB — CBC WITH DIFFERENTIAL/PLATELET
Basophils Absolute: 0.1 10*3/uL (ref 0.0–0.1)
Basophils Relative: 0 %
EOS ABS: 0.4 10*3/uL (ref 0.0–0.7)
Eosinophils Relative: 2 %
HEMATOCRIT: 22.7 % — AB (ref 36.0–46.0)
HEMOGLOBIN: 7.9 g/dL — AB (ref 12.0–15.0)
LYMPHS ABS: 4.3 10*3/uL — AB (ref 0.7–4.0)
LYMPHS PCT: 18 %
MCH: 35.1 pg — AB (ref 26.0–34.0)
MCHC: 34.8 g/dL (ref 30.0–36.0)
MCV: 100.9 fL — AB (ref 78.0–100.0)
MONOS PCT: 9 %
Monocytes Absolute: 2.2 10*3/uL — ABNORMAL HIGH (ref 0.1–1.0)
NEUTROS ABS: 17.4 10*3/uL — AB (ref 1.7–7.7)
NEUTROS PCT: 71 %
Platelets: 342 10*3/uL (ref 150–400)
RBC: 2.25 MIL/uL — AB (ref 3.87–5.11)
RDW: 17.8 % — ABNORMAL HIGH (ref 11.5–15.5)
WBC: 24.3 10*3/uL — AB (ref 4.0–10.5)

## 2015-11-28 LAB — ECHOCARDIOGRAM COMPLETE
Ao-asc: 28 cm
E decel time: 164 msec
EERAT: 7.88
FS: 36 % (ref 28–44)
HEIGHTINCHES: 59 in
IVS/LV PW RATIO, ED: 0.87
LA ID, A-P, ES: 34 mm
LA diam end sys: 34 mm
LA vol index: 28.9 mL/m2
LA vol: 56.2 mL
LADIAMINDEX: 1.75 cm/m2
LAVOLA4C: 53 mL
LV TDI E'LATERAL: 12.4
LVEEAVG: 7.88
LVEEMED: 7.88
LVELAT: 12.4 cm/s
LVOT VTI: 25.4 cm
LVOT area: 2.84 cm2
LVOT diameter: 19 mm
LVOT peak grad rest: 9 mmHg
LVOT peak vel: 149 cm/s
LVOTSV: 72 mL
MV Dec: 164
MV pk A vel: 104 m/s
MVPG: 4 mmHg
MVPKEVEL: 97.7 m/s
PW: 11.7 mm — AB (ref 0.6–1.1)
RV LATERAL S' VELOCITY: 14.1 cm/s
RV TAPSE: 26 mm
RV sys press: 35 mmHg
Reg peak vel: 258 cm/s
TDI e' medial: 7.18
TRMAXVEL: 258 cm/s
WEIGHTICAEL: 3043.2 [oz_av]

## 2015-11-28 LAB — BASIC METABOLIC PANEL
Anion gap: 6 (ref 5–15)
BUN: 13 mg/dL (ref 6–20)
CHLORIDE: 102 mmol/L (ref 101–111)
CO2: 23 mmol/L (ref 22–32)
CREATININE: 0.5 mg/dL (ref 0.44–1.00)
Calcium: 12.5 mg/dL — ABNORMAL HIGH (ref 8.9–10.3)
GFR calc Af Amer: >60 mL/min (ref >60)
GFR calc non Af Amer: >60 mL/min (ref >60)
Glucose, Bld: 138 mg/dL — ABNORMAL HIGH (ref 65–99)
Potassium: 4.4 mmol/L (ref 3.5–5.1)
SODIUM: 131 mmol/L — AB (ref 135–145)

## 2015-11-28 LAB — RETICULOCYTES
RBC.: 2.25 MIL/uL — ABNORMAL LOW (ref 3.87–5.11)
RETIC CT PCT: 17.1 % — AB (ref 0.4–3.1)
Retic Count, Absolute: 384.8 10*3/uL — ABNORMAL HIGH (ref 19.0–186.0)

## 2015-11-28 MED ORDER — VITAMINS A & D EX OINT
TOPICAL_OINTMENT | CUTANEOUS | Status: AC
  Filled 2015-11-28: qty 5

## 2015-11-28 MED ORDER — SODIUM CHLORIDE 0.9 % IV SOLN
Freq: Once | INTRAVENOUS | Status: AC
  Administered 2015-11-28: 16:00:00 via INTRAVENOUS

## 2015-11-28 MED ORDER — DIPHENHYDRAMINE HCL 25 MG PO CAPS
25.0000 mg | ORAL_CAPSULE | Freq: Once | ORAL | Status: AC
  Administered 2015-11-28: 25 mg via ORAL
  Filled 2015-11-28: qty 1

## 2015-11-28 MED ORDER — ACETAMINOPHEN 325 MG PO TABS
650.0000 mg | ORAL_TABLET | Freq: Once | ORAL | Status: AC
  Administered 2015-11-28: 650 mg via ORAL
  Filled 2015-11-28: qty 2

## 2015-11-28 MED ORDER — CEFDINIR 125 MG/5ML PO SUSR
300.0000 mg | Freq: Two times a day (BID) | ORAL | Status: DC
  Administered 2015-11-28 – 2015-11-29 (×3): 300 mg via ORAL
  Filled 2015-11-28 (×3): qty 15

## 2015-11-28 NOTE — Plan of Care (Signed)
Problem: Safety: Goal: Ability to remain free from injury will improve Outcome: Completed/Met Date Met: 11/28/15 Pts gait remain steady. Pt is out of bed self
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
## 2015-11-28 NOTE — Progress Notes (Signed)
  Echocardiogram 2D Echocardiogram has been performed.  Janalyn HarderWest, Tinley Rought R 11/28/2015, 3:27 PM

## 2015-11-28 NOTE — Progress Notes (Signed)
Report given by ongoing nurse. Agreed with nurse assessment of patient and will cont to monitor.  

## 2015-11-29 DIAGNOSIS — J189 Pneumonia, unspecified organism: Secondary | ICD-10-CM

## 2015-11-29 DIAGNOSIS — R740 Nonspecific elevation of levels of transaminase and lactic acid dehydrogenase [LDH]: Secondary | ICD-10-CM

## 2015-11-29 LAB — TYPE AND SCREEN
ABO/RH(D): B POS
ANTIBODY SCREEN: NEGATIVE
DONOR AG TYPE: NEGATIVE
DONOR AG TYPE: NEGATIVE
UNIT DIVISION: 0
UNIT DIVISION: 0

## 2015-11-29 LAB — LEGIONELLA PNEUMOPHILA SEROGP 1 UR AG: L. pneumophila Serogp 1 Ur Ag: NEGATIVE

## 2015-11-29 MED ORDER — CEFDINIR 125 MG/5ML PO SUSR: 300.0000 mg | Freq: Two times a day (BID) | ORAL | 0 refills | Status: AC

## 2015-11-29 MED ORDER — HEPARIN SOD (PORK) LOCK FLUSH 100 UNIT/ML IV SOLN
500.0000 [IU] | Freq: Once | INTRAVENOUS | Status: AC
  Administered 2015-11-29: 500 [IU]
  Filled 2015-11-29: qty 5

## 2015-11-29 MED ORDER — METOPROLOL SUCCINATE ER 25 MG PO TB24: 12.5000 mg | ORAL_TABLET | Freq: Every day | ORAL | 1 refills | Status: DC

## 2015-11-29 NOTE — Care Management Note (Signed)
Case Management Note  Patient Details  Name: Pamela GongDevon Hudson MRN: [id number] Date of Birth: 1985-04-18  Subjective/Objective:   PNA, Sickle Cell Anemia               Action/Plan: Discharge Planning: AVS reviewed:  NCM spoke to pt at bedside. Pt states she can afford medications. No NCM needs identified.   Sherin QuarryPCP-WATTERSON, PATRICK MD  Expected Discharge Date:  11/29/2015              Expected Discharge Plan:  Home/Self Care  In-House Referral:  NA  Discharge planning Services  CM Consult  Post Acute Care Choice:  NA Choice offered to:  NA  DME Arranged:  N/A DME Agency:  NA  HH Arranged:  NA HH Agency:  NA  Status of Service:  Completed, signed off  If discussed at Long Length of Stay Meetings, dates discussed:    Additional Comments:  Elliot CousinShavis, Kie Calvin Ellen, RN 11/29/2015, 4:14 PM

## 2015-11-29 NOTE — Care Management Important Message (Signed)
Important Message  Patient Details  Name: Pamela GongDevon Hudson MRN: [id number] Date of Birth: Oct 28, 1985   Medicare Important Message Given:  Yes    Elliot CousinShavis, Amarylis Rovito Ellen, RN 11/29/2015, 11:51 AM

## 2015-11-29 NOTE — Progress Notes (Signed)
Patient discharged to home, all discharge medications and instructions reviewed and questions answered.  Patient to be assisted to vehicle by wheelchair.  
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
## 2015-12-01 LAB — CULTURE, BLOOD (SINGLE): CULTURE: NO GROWTH

## 2015-12-13 ENCOUNTER — Emergency Department (HOSPITAL_COMMUNITY)
Admission: EM | Admit: 2015-12-13 | Discharge: 2015-12-13 | Discharge status: Absent without leave | Payer: No Typology Code available for payment source

## 2016-02-16 ENCOUNTER — Emergency Department (HOSPITAL_BASED_OUTPATIENT_CLINIC_OR_DEPARTMENT_OTHER): Payer: Medicare Other

## 2016-02-16 ENCOUNTER — Encounter (HOSPITAL_BASED_OUTPATIENT_CLINIC_OR_DEPARTMENT_OTHER): Payer: Self-pay | Admitting: *Deleted

## 2016-02-16 ENCOUNTER — Emergency Department (HOSPITAL_BASED_OUTPATIENT_CLINIC_OR_DEPARTMENT_OTHER)
Admission: EM | Admit: 2016-02-16 | Discharge: 2016-02-16 | Disposition: A | Discharge status: Home / Self Care | Payer: Medicare Other | Attending: Emergency Medicine | Admitting: Emergency Medicine

## 2016-02-16 DIAGNOSIS — M25561 Pain in right knee: Secondary | ICD-10-CM | POA: Diagnosis not present

## 2016-02-16 DIAGNOSIS — Z791 Long term (current) use of non-steroidal anti-inflammatories (NSAID): Secondary | ICD-10-CM | POA: Diagnosis not present

## 2016-02-16 DIAGNOSIS — Z5321 Procedure and treatment not carried out due to patient leaving prior to being seen by health care provider: Secondary | ICD-10-CM | POA: Insufficient documentation

## 2016-02-16 DIAGNOSIS — Z79899 Other long term (current) drug therapy: Secondary | ICD-10-CM | POA: Insufficient documentation

## 2016-02-16 DIAGNOSIS — Z87891 Personal history of nicotine dependence: Secondary | ICD-10-CM | POA: Diagnosis not present

## 2016-02-16 NOTE — ED Triage Notes (Signed)
Right knee pain and swelling x 3 days.

## 2016-07-05 ENCOUNTER — Emergency Department (HOSPITAL_BASED_OUTPATIENT_CLINIC_OR_DEPARTMENT_OTHER)
Admission: EM | Admit: 2016-07-05 | Discharge: 2016-07-05 | Disposition: A | Discharge status: Home / Self Care | Payer: No Typology Code available for payment source | Attending: Emergency Medicine | Admitting: Emergency Medicine

## 2016-07-05 ENCOUNTER — Encounter (HOSPITAL_BASED_OUTPATIENT_CLINIC_OR_DEPARTMENT_OTHER): Payer: Self-pay

## 2016-07-05 ENCOUNTER — Emergency Department (HOSPITAL_BASED_OUTPATIENT_CLINIC_OR_DEPARTMENT_OTHER): Payer: No Typology Code available for payment source

## 2016-07-05 DIAGNOSIS — Y999 Unspecified external cause status: Secondary | ICD-10-CM | POA: Diagnosis not present

## 2016-07-05 DIAGNOSIS — S161XXA Strain of muscle, fascia and tendon at neck level, initial encounter: Secondary | ICD-10-CM | POA: Insufficient documentation

## 2016-07-05 DIAGNOSIS — Y9389 Activity, other specified: Secondary | ICD-10-CM | POA: Insufficient documentation

## 2016-07-05 DIAGNOSIS — Z87891 Personal history of nicotine dependence: Secondary | ICD-10-CM | POA: Diagnosis not present

## 2016-07-05 DIAGNOSIS — Z79899 Other long term (current) drug therapy: Secondary | ICD-10-CM | POA: Insufficient documentation

## 2016-07-05 DIAGNOSIS — S39012A Strain of muscle, fascia and tendon of lower back, initial encounter: Secondary | ICD-10-CM | POA: Insufficient documentation

## 2016-07-05 DIAGNOSIS — Y92410 Unspecified street and highway as the place of occurrence of the external cause: Secondary | ICD-10-CM | POA: Insufficient documentation

## 2016-07-05 DIAGNOSIS — S3992XA Unspecified injury of lower back, initial encounter: Secondary | ICD-10-CM | POA: Diagnosis present

## 2016-07-05 LAB — PREGNANCY, URINE: PREG TEST UR: NEGATIVE

## 2016-07-05 MED ORDER — NAPROXEN 375 MG PO TABS: 375.0000 mg | ORAL_TABLET | Freq: Two times a day (BID) | ORAL | 0 refills | Status: DC

## 2016-07-05 MED ORDER — METHOCARBAMOL 500 MG PO TABS: 500.0000 mg | ORAL_TABLET | Freq: Two times a day (BID) | ORAL | 0 refills | Status: DC

## 2016-07-05 MED ORDER — KETOROLAC TROMETHAMINE 30 MG/ML IJ SOLN
30.0000 mg | Freq: Once | INTRAMUSCULAR | Status: DC
  Filled 2016-07-05: qty 1

## 2016-07-05 MED FILL — NAPROXEN 375 MG TABLET: 375 | 5 days supply | Qty: 10 | Fill #0

## 2016-07-05 MED FILL — METHOCARBAMOL 500 MG TABLET: 500 | 7 days supply | Qty: 14 | Fill #0
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
## 2016-07-05 NOTE — Discharge Instructions (Signed)
X-ray show no fracture. This is likely muscular skeletal sprain. Use anti-inflammatories as needed for pain. Robaxin for muscle relaxation. Heating pad to the affected area. Warm soaks in Epsom salts. Follow-up with the primary care doctor if her symptoms not improve. Return to ED if your symptoms worsen.
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
## 2016-08-01 ENCOUNTER — Encounter (HOSPITAL_BASED_OUTPATIENT_CLINIC_OR_DEPARTMENT_OTHER): Payer: Self-pay | Admitting: *Deleted

## 2016-08-01 ENCOUNTER — Emergency Department (HOSPITAL_BASED_OUTPATIENT_CLINIC_OR_DEPARTMENT_OTHER)
Admission: EM | Admit: 2016-08-01 | Discharge: 2016-08-01 | Disposition: A | Discharge status: Home / Self Care | Payer: Medicare Other | Attending: Emergency Medicine | Admitting: Emergency Medicine

## 2016-08-01 ENCOUNTER — Emergency Department (HOSPITAL_BASED_OUTPATIENT_CLINIC_OR_DEPARTMENT_OTHER): Payer: Medicare Other

## 2016-08-01 DIAGNOSIS — D571 Sickle-cell disease without crisis: Secondary | ICD-10-CM | POA: Diagnosis not present

## 2016-08-01 DIAGNOSIS — R0789 Other chest pain: Secondary | ICD-10-CM | POA: Insufficient documentation

## 2016-08-01 DIAGNOSIS — Z8673 Personal history of transient ischemic attack (TIA), and cerebral infarction without residual deficits: Secondary | ICD-10-CM | POA: Diagnosis not present

## 2016-08-01 DIAGNOSIS — R079 Chest pain, unspecified: Secondary | ICD-10-CM

## 2016-08-01 LAB — COMPREHENSIVE METABOLIC PANEL
ALBUMIN: 3.6 g/dL (ref 3.5–5.0)
ALK PHOS: 210 U/L — AB (ref 38–126)
ALT: 82 U/L — ABNORMAL HIGH (ref 14–54)
ANION GAP: 5 (ref 5–15)
AST: 128 U/L — AB (ref 15–41)
BUN: 13 mg/dL (ref 6–20)
CALCIUM: 10.8 mg/dL — AB (ref 8.9–10.3)
CO2: 24 mmol/L (ref 22–32)
Chloride: 107 mmol/L (ref 101–111)
Creatinine, Ser: 0.52 mg/dL (ref 0.44–1.00)
GFR calc Af Amer: >60 mL/min (ref >60)
GFR calc non Af Amer: >60 mL/min (ref >60)
GLUCOSE: 105 mg/dL — AB (ref 65–99)
POTASSIUM: 4.1 mmol/L (ref 3.5–5.1)
SODIUM: 136 mmol/L (ref 135–145)
Total Bilirubin: 9.8 mg/dL — ABNORMAL HIGH (ref 0.3–1.2)
Total Protein: 8.5 g/dL — ABNORMAL HIGH (ref 6.5–8.1)

## 2016-08-01 LAB — CBC WITH DIFFERENTIAL/PLATELET
BASOS PCT: 1 %
Basophils Absolute: 0.3 10*3/uL — ABNORMAL HIGH (ref 0.0–0.1)
Eosinophils Absolute: 0.9 10*3/uL — ABNORMAL HIGH (ref 0.0–0.7)
Eosinophils Relative: 3 %
HCT: 19.1 % — ABNORMAL LOW (ref 36.0–46.0)
HEMOGLOBIN: 7 g/dL — AB (ref 12.0–15.0)
LYMPHS PCT: 35 %
Lymphs Abs: 10.7 10*3/uL — ABNORMAL HIGH (ref 0.7–4.0)
MCH: 38.9 pg — AB (ref 26.0–34.0)
MCHC: 36.6 g/dL — ABNORMAL HIGH (ref 30.0–36.0)
MCV: 106.1 fL — AB (ref 78.0–100.0)
MONOS PCT: 10 %
Monocytes Absolute: 3.1 10*3/uL — ABNORMAL HIGH (ref 0.1–1.0)
NEUTROS ABS: 15.7 10*3/uL — AB (ref 1.7–7.7)
Neutrophils Relative %: 51 %
Platelets: 306 10*3/uL (ref 150–400)
RBC: 1.8 MIL/uL — ABNORMAL LOW (ref 3.87–5.11)
RDW: 19.6 % — ABNORMAL HIGH (ref 11.5–15.5)
WBC: 30.7 10*3/uL — ABNORMAL HIGH (ref 4.0–10.5)

## 2016-08-01 LAB — PREGNANCY, URINE: Preg Test, Ur: NEGATIVE

## 2016-08-01 MED ORDER — IOPAMIDOL (ISOVUE-370) INJECTION 76%
100.0000 mL | Freq: Once | INTRAVENOUS | Status: AC | PRN
Start: 2016-08-01 — End: 2016-08-01
  Administered 2016-08-01: 100 mL via INTRAVENOUS

## 2016-08-01 MED ORDER — IBUPROFEN 800 MG PO TABS: 800.0000 mg | ORAL_TABLET | Freq: Three times a day (TID) | ORAL | 0 refills | Status: DC | PRN

## 2016-08-01 MED ORDER — SODIUM CHLORIDE 0.9 % IV BOLUS (SEPSIS)
500.0000 mL | Freq: Once | INTRAVENOUS | Status: AC
  Administered 2016-08-01: 500 mL via INTRAVENOUS

## 2016-08-01 NOTE — ED Notes (Signed)
Patient denies pain and is resting comfortably.  

## 2016-08-01 NOTE — ED Notes (Signed)
Patient transported to CT 

## 2016-08-01 NOTE — Discharge Instructions (Signed)

## 2016-08-01 NOTE — ED Triage Notes (Signed)
Patient states she has had right sided chest pain for the last 24 hours.  States she has pain when she moves her right arm or takes a deep breath.

## 2016-08-01 NOTE — ED Provider Notes (Signed)
Emergency Department Provider Note   I have reviewed the triage vital signs and the nursing notes.   HISTORY  Chief Complaint Chest Pain (right)   HPI Pamela Hudson is a 31 y.o. female with PMH of SS anemia and prior CVA presents to the emergency department for evaluation of right-sided pleuritic chest pain for the past 24 hours. Patient states that the pain is a "pulling" quality is worse with movement of her right arm or deep breathing. She denies any dyspnea. No fevers or chills. No radiation of pain. No prior history of PE. She has a history of pneumonia. States this does not feel like a sickle cell pain crisis. The constant and made worse with the above factors. No alleviating factors.   Past Medical History:  Diagnosis Date  . CVA (cerebral infarction) 02/2002  . Sickle cell anemia [facility](HCC)     Patient Active Problem List   Diagnosis Date Noted  . Pneumonia 11/26/2015  . Sickle cell anemia (HCC) 11/26/2015  . Elevated bilirubin 11/26/2015  . Serum calcium elevated 11/26/2015  . Hyponatremia 11/26/2015  . Leukocytosis 11/26/2015  . CVA (cerebral infarction) 02/13/2012    Past Surgical History:  Procedure Laterality Date  . CHOLECYSTECTOMY    . LIVER BIOPSY  2007  . PORTACATH PLACEMENT      Current Outpatient Rx  . Order #: [id number] Class: Historical Med  . Order #: [id number] Class: Historical Med  . Order #: [id number] Class: Historical Med  . Order #: [id number] Class: Print  . Order #: [id number] Class: Historical Med  . Order #: [id number] Class: Print  . Order #: [id number] Class: Print  . Order #: [id number] Class: Print  . Order #: [id number] Class: Historical Med    Allergies Patient has no known allergies.  No family history on file.  Social History Social History  Substance Use Topics  . Smoking status: Former Games developermoker  . Smokeless tobacco: Never Used  . Alcohol use Yes     Comment: occasion    Review of Systems  Constitutional: No fever/chills Eyes:  No visual changes. ENT: No sore throat. Cardiovascular: Positive chest pain. Respiratory: Denies shortness of breath. Gastrointestinal: No abdominal pain.  No nausea, no vomiting.  No diarrhea.  No constipation. Genitourinary: Negative for dysuria. Musculoskeletal: Negative for back pain. Skin: Negative for rash. Neurological: Negative for headaches, focal weakness or numbness.  10-point ROS otherwise negative.  ____________________________________________   PHYSICAL EXAM:  VITAL SIGNS: ED Triage Vitals  Enc Vitals Group     BP 08/01/16 0950 138/73     Pulse Rate 08/01/16 0950 78     Resp 08/01/16 0950 18     Temp 08/01/16 0950 99.4 F (37.4 C)     Temp Source 08/01/16 0950 Oral     SpO2 08/01/16 0950 100 %     Weight 08/01/16 0951 189 lb (85.7 kg)     Height 08/01/16 0951 4\' 11"  (1.499 m)     Pain Score 08/01/16 0956 4   Constitutional: Alert and oriented. Well appearing and in no acute distress. Eyes: Conjunctivae are normal. Head: Atraumatic. Nose: No congestion/rhinnorhea. Mouth/Throat: Mucous membranes are moist.  Oropharynx non-erythematous. Neck: No stridor.  Cardiovascular: Normal rate, regular rhythm. Good peripheral circulation. Grossly normal heart sounds.   Respiratory: Normal respiratory effort.  No retractions. Lungs CTAB. Gastrointestinal: Soft and nontender. No distention.  Musculoskeletal: No lower extremity tenderness nor edema. No gross deformities of extremities. No chest wall tenderness to palpation.  Neurologic:  Normal speech and language. No gross focal neurologic deficits  are appreciated.  Skin:  Skin is warm, dry and intact. No rash noted. Psychiatric: Mood and affect are normal. Speech and behavior are normal.  ____________________________________________   LABS (all labs ordered are listed, but only abnormal results are displayed)  Labs Reviewed  COMPREHENSIVE METABOLIC PANEL - Abnormal; Notable for the following:       Result Value    Glucose, Bld 105 (*)    Calcium 10.8 (*)    Total Protein 8.5 (*)    AST 128 (*)    ALT 82 (*)    Alkaline Phosphatase 210 (*)    Total Bilirubin 9.8 (*)    All other components within normal limits  CBC WITH DIFFERENTIAL/PLATELET - Abnormal; Notable for the following:    WBC 30.7 (*)    RBC 1.80 (*)    Hemoglobin 7.0 (*)    HCT 19.1 (*)    MCV 106.1 (*)    MCH 38.9 (*)    MCHC 36.6 (*)    RDW 19.6 (*)    Neutro Abs 15.7 (*)    Lymphs Abs 10.7 (*)    Monocytes Absolute 3.1 (*)    Eosinophils Absolute 0.9 (*)    Basophils Absolute 0.3 (*)    All other components within normal limits  PREGNANCY, URINE   ____________________________________________  EKG   EKG Interpretation  Date/Time:  Sunday Aug 01 2016 09:56:54 EDT Ventricular Rate:  94 PR Interval:    QRS Duration: 87 QT Interval:  350 QTC Calculation: 438 R Axis:   46 Text Interpretation:  Sinus rhythm No STEMI.  Confirmed by Alona Bene [phone]) on 08/01/2016 10:44:22 AM       ____________________________________________  RADIOLOGY  CXR:   IMPRESSION:  Cardiomegaly with pulmonary vascular congestion. No frank  interstitial edema.    Mild left basilar atelectasis.      Electronically Signed  By: Charline Bills M.D.  On: 08/01/2016 10:36   CT angio chest (PE protocol):  IMPRESSION:  1. Negative for acute PE or thoracic aortic dissection.  2. Interval progression of mediastinal adenopathy.      Electronically Signed  By: Corlis Leak M.D.  On: 08/01/2016 13:00    ____________________________________________   PROCEDURES  Procedure(s) performed:   Procedures  None ____________________________________________   INITIAL IMPRESSION / ASSESSMENT AND PLAN / ED COURSE  Pertinent labs & imaging results that were available during my care of the patient were reviewed by me and considered in my medical decision making (see chart for details).  Patient presents to the  emergency department for evaluation of chest pain. On my evaluation the patient is noted to have some hypoxemia bedside. Oxygen saturation 88% intermittently with good waveform on monitor. Patient describes some chest discomfort that has some features of musculoskeletal discomfort but also pleuritic quality. She has sickle cell anemia and is at increased risk for clot formation. Do not feel I can adequately rule her out with d-dimer especially in the context of some hypoxemia. No acute findings on chest x-ray. Plan for CT of the chest and reassessment.   Patient continues to feel well. O2 is boarder line low on RA but no evidence of acute chest or PE on CT. Patient not dyspneic at rest or with exertion. Labs are abnormal but near the patient's baseline values. No evidence of infection. No indication for further imaging or testing at this time. Suspect MSK etiology of pain.   At this time, I do not feel there is any life-threatening condition present. I have  reviewed and discussed all results (EKG, imaging, lab, urine as appropriate), exam findings with patient. I have reviewed nursing notes and appropriate previous records.  I feel the patient is safe to be discharged home without further emergent workup. Discussed usual and customary return precautions. Patient and family (if present) verbalize understanding and are comfortable with this plan.  Patient will follow-up with their primary care provider. If they do not have a primary care provider, information for follow-up has been provided to them. All questions have been answered.  ____________________________________________  FINAL CLINICAL IMPRESSION(S) / ED DIAGNOSES  Final diagnoses:  Chest pain     MEDICATIONS GIVEN DURING THIS VISIT:  Medications  sodium chloride 0.9 % bolus 500 mL (0 mLs Intravenous Stopped 08/01/16 1204)  iopamidol (ISOVUE-370) 76 % injection 100 mL (100 mLs Intravenous Contrast Given 08/01/16 1220)     NEW OUTPATIENT  MEDICATIONS STARTED DURING THIS VISIT:  None   Note:  This document was prepared using Dragon voice recognition software and may include unintentional dictation errors.  Alona Bene, MD Emergency Medicine   Carlena Ruybal, Arlyss Repress, MD 08/02/16 [phone]

## 2016-12-01 ENCOUNTER — Encounter (HOSPITAL_BASED_OUTPATIENT_CLINIC_OR_DEPARTMENT_OTHER): Payer: Self-pay | Admitting: Emergency Medicine

## 2016-12-01 ENCOUNTER — Emergency Department (HOSPITAL_BASED_OUTPATIENT_CLINIC_OR_DEPARTMENT_OTHER)
Admission: EM | Admit: 2016-12-01 | Discharge: 2016-12-01 | Disposition: A | Discharge status: Home / Self Care | Payer: Medicare Other | Attending: Emergency Medicine | Admitting: Emergency Medicine

## 2016-12-01 DIAGNOSIS — Z8673 Personal history of transient ischemic attack (TIA), and cerebral infarction without residual deficits: Secondary | ICD-10-CM | POA: Insufficient documentation

## 2016-12-01 DIAGNOSIS — J039 Acute tonsillitis, unspecified: Secondary | ICD-10-CM | POA: Diagnosis not present

## 2016-12-01 DIAGNOSIS — Z87891 Personal history of nicotine dependence: Secondary | ICD-10-CM | POA: Insufficient documentation

## 2016-12-01 DIAGNOSIS — Z79899 Other long term (current) drug therapy: Secondary | ICD-10-CM | POA: Diagnosis not present

## 2016-12-01 DIAGNOSIS — J029 Acute pharyngitis, unspecified: Secondary | ICD-10-CM

## 2016-12-01 LAB — RAPID STREP SCREEN (MED CTR MEBANE ONLY): STREPTOCOCCUS, GROUP A SCREEN (DIRECT): NEGATIVE

## 2016-12-01 MED ORDER — DEXAMETHASONE SODIUM PHOSPHATE 10 MG/ML IJ SOLN
10.0000 mg | Freq: Once | INTRAMUSCULAR | Status: AC
  Administered 2016-12-01: 10 mg
  Filled 2016-12-01: qty 1

## 2016-12-01 NOTE — ED Provider Notes (Signed)
[facility] Provider Note   CSN: [id number] Arrival date & time: 12/01/16  1542     History   Chief Complaint Chief Complaint  Patient presents with  . Sore Throat    HPI Monigue Hudson is a 31 y.o. female presenting with sudden onset sore throat 3 days ago and one day of coughing. She reports having young children at home but no known ill contacts. She has tried cough drops with mild relief. Reports dysphagia and chills, has been able to drink fluids without difficulties. She denies any chest pain, shortness of breath, fever, nausea, vomiting, or other symptoms.  HPI  Past Medical History:  Diagnosis Date  . CVA (cerebral infarction) 02/2002  . Sickle cell anemia [facility])     Patient Active Problem List   Diagnosis Date Noted  . Pneumonia 11/26/2015  . Sickle cell anemia (HCC) 11/26/2015  . Elevated bilirubin 11/26/2015  . Serum calcium elevated 11/26/2015  . Hyponatremia 11/26/2015  . Leukocytosis 11/26/2015  . CVA (cerebral infarction) 02/13/2012    Past Surgical History:  Procedure Laterality Date  . CHOLECYSTECTOMY    . LIVER BIOPSY  2007  . PORTACATH PLACEMENT      OB History    No data available       Home Medications    Prior to Admission medications   Medication Sig Start Date End Date Taking? Authorizing Provider  Deferasirox (JADENU) 360 MG TABS Take 1,080 mg by mouth daily.    [provider]  folic acid (FOLVITE) 1 MG tablet Take 1 mg by mouth daily. 11/21/15   [provider]  hydroxyurea (HYDREA) 500 MG capsule Take 1,500-2,000 mg by mouth every other day. May take with food to minimize GI side effects.     [provider]  ibuprofen (ADVIL,MOTRIN) 800 MG tablet Take 1 tablet (800 mg total) by mouth every 8 (eight) hours as needed. 08/01/16   Long, Arlyss Repress, MD  methocarbamol (ROBAXIN) 500 MG tablet Take 1 tablet (500 mg total) by mouth 2 (two) times daily. 07/05/16   Demetrios Loll T, PA-C  metoprolol  succinate (TOPROL XL) 25 MG 24 hr tablet Take 0.5 tablets (12.5 mg total) by mouth daily. 11/29/15   Quentin Angst, MD  naproxen (NAPROSYN) 375 MG tablet Take 1 tablet (375 mg total) by mouth 2 (two) times daily. 07/05/16   Rise Mu, PA-C  progesterone (PROMETRIUM) 200 MG capsule Take 200 mg by mouth at bedtime. 11/19/15 11/18/16  [provider]  Vitamin D, Ergocalciferol, (DRISDOL) 50000 units CAPS capsule Take 50,000 capsules by mouth every Monday. 11/17/15   [provider]    Family History History reviewed. No pertinent family history.  Social History Social History  Substance Use Topics  . Smoking status: Former Games developer  . Smokeless tobacco: Never Used  . Alcohol use Yes     Comment: occasion     Allergies   Patient has no known allergies.   Review of Systems Review of Systems  Constitutional: Positive for chills. Negative for diaphoresis, fatigue and fever.  HENT: Positive for congestion and sore throat. Negative for drooling, ear discharge, facial swelling, sinus pain, sinus pressure, trouble swallowing and voice change.   Respiratory: Positive for cough. Negative for choking, chest tightness, shortness of breath, wheezing and stridor.   Cardiovascular: Negative for chest pain and palpitations.  Gastrointestinal: Negative for abdominal pain, nausea and vomiting.  Musculoskeletal: Negative for myalgias, neck pain and neck stiffness.  Skin: Negative for color  change, pallor, rash and wound.  Neurological: Negative for dizziness, light-headedness and headaches.     Physical Exam Updated Vital Signs BP 112/70   Pulse 89   Temp 99 F (37.2 C) (Oral)   Resp 18   Ht  (1.499 m)   Wt 97.5 kg (215 lb)   LMP 10/15/2016 (Approximate)   SpO2 93%   BMI 43.42 kg/m   Physical Exam  Constitutional: She appears well-developed and well-nourished. No distress.  Afebrile, nontoxic-appearing, sitting comfortably in chair in no acute distress.    HENT:  Head: Normocephalic and atraumatic.  Mouth/Throat: Uvula is midline. Mucous membranes are not pale and not cyanotic. No trismus in the jaw. No uvula swelling. Posterior oropharyngeal erythema present. No oropharyngeal exudate, posterior oropharyngeal edema or tonsillar abscesses. Tonsils are 3+ on the right. Tonsils are 3+ on the left. No tonsillar exudate.  No gross oral abscess. Uvula is midline, arches are simmetrical and intact. No peritonsilar swelling or exudate. No trismus. Sublingual mucosa is soft and non-tender. Tolerating oral secretions. No concern for ludwig's angina.   Eyes: Conjunctivae and EOM are normal. Right eye exhibits no discharge. Left eye exhibits no discharge.  Neck: Normal range of motion. Neck supple.  Cardiovascular: Normal rate, regular rhythm and normal heart sounds.   No murmur heard. Pulmonary/Chest: Effort normal. No respiratory distress. She has no wheezes. She has no rales.  Abdominal: She exhibits no distension.  Musculoskeletal: Normal range of motion. She exhibits no edema.  Lymphadenopathy:    She has cervical adenopathy.  Neurological: She is alert.  Skin: Skin is warm and dry. No rash noted. She is not diaphoretic. No erythema. No pallor.  Psychiatric: She has a normal mood and affect.  Nursing note and vitals reviewed.    ED Treatments / Results  Labs (all labs ordered are listed, but only abnormal results are displayed) Labs Reviewed  RAPID STREP SCREEN (NOT AT [facility])  CULTURE, GROUP A STREP [facility])    EKG  EKG Interpretation None       Radiology No results found.  Procedures Procedures (including critical care time)  Medications Ordered in ED Medications  dexamethasone (DECADRON) injection 10 mg (10 mg Other Given 12/01/16 1723)     Initial Impression / Assessment and Plan / ED Course  I have reviewed the triage vital signs and the nursing notes.  Pertinent labs & imaging results that were available during my care of  the patient were reviewed by me and considered in my medical decision making (see chart for details).     Pt afebrile without tonsillar exudate, negative strep. Presents with mild cervical lymphadenopathy, & dysphagia; diagnosis of viral pharyngitis.  No abx indicated. DC w symptomatic tx for pain  Pt does not appear dehydrated, but did discuss importance of water rehydration. Presentation non concerning for PTA or infxn spread to soft tissue. No trismus or uvula deviation. Specific return precautions discussed. Pt able to drink water in ED without difficulty with intact airway. Recommended PCP follow up. Patient was given Decadron orally while in the emergency department. She is otherwise well-appearing, nontoxic and afebrile, no respiratory distress, lungs CTA bilaterally.  Discharge home with symptomatic relief and close follow-up with PCP as needed  Discussed strict return precautions and advised to return to the emergency department if experiencing any new or worsening symptoms. Instructions were understood and patient agreed with discharge plan. Final Clinical Impressions(s) / ED Diagnoses   Final diagnoses:  Viral pharyngitis  Tonsillitis  New Prescriptions Discharge Medication List as of 12/01/2016  5:18 PM       Georgiana Shore, PA-C 12/01/16 1747    Georgiana Shore, PA-C 12/01/16 Evette Doffing    Rolland Porter, MD 12/13/16 2210

## 2016-12-01 NOTE — ED Triage Notes (Signed)
Patient reports sore throat since Sunday.  Reports pain when swallowing.  Also endorses cough.

## 2016-12-01 NOTE — Discharge Instructions (Signed)
As discussed, please stay well-hydrated drinking plenty of water and keeping your urine clear. Used tea with honey, cough drops, throat sprays, gargles with salt water to help soothe your throat.  Follow-up with a primary care provider and establish care. Return if you experience any worsening of symptoms, including difficulty breathing, difficulty swallowing, worsening pain, fever, nausea, vomiting, facial swelling or any other new concerning symptoms in the meantime.

## 2016-12-04 LAB — CULTURE, GROUP A STREP (THRC)

## 2016-12-25 ENCOUNTER — Inpatient Hospital Stay (HOSPITAL_BASED_OUTPATIENT_CLINIC_OR_DEPARTMENT_OTHER)
Admission: EM | Admit: 2016-12-25 | Discharge: 2016-12-27 | DRG: 812 | Disposition: A | Discharge status: Home / Self Care | Payer: Medicare Other | Attending: Obstetrics and Gynecology | Admitting: Obstetrics and Gynecology

## 2016-12-25 ENCOUNTER — Encounter (HOSPITAL_BASED_OUTPATIENT_CLINIC_OR_DEPARTMENT_OTHER): Payer: Self-pay | Admitting: Emergency Medicine

## 2016-12-25 ENCOUNTER — Emergency Department (HOSPITAL_BASED_OUTPATIENT_CLINIC_OR_DEPARTMENT_OTHER): Payer: Medicare Other

## 2016-12-25 DIAGNOSIS — N200 Calculus of kidney: Secondary | ICD-10-CM | POA: Diagnosis present

## 2016-12-25 DIAGNOSIS — K429 Umbilical hernia without obstruction or gangrene: Secondary | ICD-10-CM | POA: Diagnosis present

## 2016-12-25 DIAGNOSIS — D649 Anemia, unspecified: Secondary | ICD-10-CM | POA: Diagnosis not present

## 2016-12-25 DIAGNOSIS — Z87891 Personal history of nicotine dependence: Secondary | ICD-10-CM

## 2016-12-25 DIAGNOSIS — N390 Urinary tract infection, site not specified: Secondary | ICD-10-CM | POA: Diagnosis present

## 2016-12-25 DIAGNOSIS — Z9049 Acquired absence of other specified parts of digestive tract: Secondary | ICD-10-CM

## 2016-12-25 DIAGNOSIS — D571 Sickle-cell disease without crisis: Secondary | ICD-10-CM | POA: Diagnosis present

## 2016-12-25 DIAGNOSIS — N83209 Unspecified ovarian cyst, unspecified side: Secondary | ICD-10-CM | POA: Diagnosis not present

## 2016-12-25 DIAGNOSIS — I517 Cardiomegaly: Secondary | ICD-10-CM | POA: Diagnosis present

## 2016-12-25 DIAGNOSIS — Z8673 Personal history of transient ischemic attack (TIA), and cerebral infarction without residual deficits: Secondary | ICD-10-CM

## 2016-12-25 HISTORY — DX: Cardiomegaly: I51.7

## 2016-12-25 HISTORY — DX: Other chronic pain: G89.29

## 2016-12-25 HISTORY — DX: Liver disease, unspecified: K76.9

## 2016-12-25 HISTORY — DX: Polycystic ovarian syndrome: E28.2

## 2016-12-25 HISTORY — DX: Calculus of kidney: N20.0

## 2016-12-25 LAB — COMPREHENSIVE METABOLIC PANEL
ALT: 74 U/L — ABNORMAL HIGH (ref 14–54)
ANION GAP: 6 (ref 5–15)
AST: 103 U/L — ABNORMAL HIGH (ref 15–41)
Albumin: 3.5 g/dL (ref 3.5–5.0)
Alkaline Phosphatase: 151 U/L — ABNORMAL HIGH (ref 38–126)
BILIRUBIN TOTAL: 11.1 mg/dL — AB (ref 0.3–1.2)
BUN: 18 mg/dL (ref 6–20)
CHLORIDE: 104 mmol/L (ref 101–111)
CO2: 22 mmol/L (ref 22–32)
Calcium: 11.8 mg/dL — ABNORMAL HIGH (ref 8.9–10.3)
Creatinine, Ser: 0.92 mg/dL (ref 0.44–1.00)
GFR calc Af Amer: >60 mL/min (ref >60)
Glucose, Bld: 140 mg/dL — ABNORMAL HIGH (ref 65–99)
POTASSIUM: 5 mmol/L (ref 3.5–5.1)
Sodium: 132 mmol/L — ABNORMAL LOW (ref 135–145)
TOTAL PROTEIN: 8.7 g/dL — AB (ref 6.5–8.1)

## 2016-12-25 LAB — CBC
HEMATOCRIT: 13.3 % — AB (ref 36.0–46.0)
HEMOGLOBIN: 4.7 g/dL — AB (ref 12.0–15.0)
MCH: 38.2 pg — ABNORMAL HIGH (ref 26.0–34.0)
MCHC: 35.3 g/dL (ref 30.0–36.0)
MCV: 108.1 fL — ABNORMAL HIGH (ref 78.0–100.0)
Platelets: 278 10*3/uL (ref 150–400)
RBC: 1.23 MIL/uL — AB (ref 3.87–5.11)
RDW: 17.2 % — ABNORMAL HIGH (ref 11.5–15.5)
WBC: 40.4 10*3/uL — AB (ref 4.0–10.5)

## 2016-12-25 LAB — URINALYSIS, MICROSCOPIC (REFLEX): Bacteria, UA: NONE SEEN

## 2016-12-25 LAB — LIPASE, BLOOD: LIPASE: 23 U/L (ref 11–51)

## 2016-12-25 LAB — WET PREP, GENITAL
Clue Cells Wet Prep HPF POC: NONE SEEN
Sperm: NONE SEEN
Trich, Wet Prep: NONE SEEN
Yeast Wet Prep HPF POC: NONE SEEN

## 2016-12-25 LAB — URINALYSIS, ROUTINE W REFLEX MICROSCOPIC
Glucose, UA: NEGATIVE mg/dL
KETONES UR: 15 mg/dL — AB
LEUKOCYTES UA: NEGATIVE
NITRITE: POSITIVE — AB
PH: 5 (ref 5.0–8.0)
Protein, ur: NEGATIVE mg/dL
SPECIFIC GRAVITY, URINE: 1.02 (ref 1.005–1.030)

## 2016-12-25 LAB — PREGNANCY, URINE: Preg Test, Ur: NEGATIVE

## 2016-12-25 LAB — CBG MONITORING, ED: GLUCOSE-CAPILLARY: 137 mg/dL — AB (ref 65–99)

## 2016-12-25 LAB — I-STAT CG4 LACTIC ACID, ED: LACTIC ACID, VENOUS: 0.93 mmol/L (ref 0.5–1.9)

## 2016-12-25 MED ORDER — ONDANSETRON HCL 4 MG/2ML IJ SOLN
4.0000 mg | Freq: Once | INTRAMUSCULAR | Status: AC
  Administered 2016-12-25: 4 mg via INTRAVENOUS

## 2016-12-25 MED ORDER — SODIUM CHLORIDE 0.9 % IV BOLUS (SEPSIS)
1000.0000 mL | Freq: Once | INTRAVENOUS | Status: AC
  Administered 2016-12-25: 1000 mL via INTRAVENOUS

## 2016-12-25 MED ORDER — ONDANSETRON HCL 4 MG/2ML IJ SOLN
INTRAMUSCULAR | Status: AC
  Administered 2016-12-25: 4 mg via INTRAVENOUS
  Filled 2016-12-25: qty 2

## 2016-12-25 MED ORDER — MORPHINE SULFATE (PF) 4 MG/ML IV SOLN
4.0000 mg | Freq: Once | INTRAVENOUS | Status: AC
  Administered 2016-12-25: 4 mg via INTRAVENOUS
  Filled 2016-12-25: qty 1

## 2016-12-25 NOTE — ED Triage Notes (Signed)
Patient states that she has had lower abdominal pain since last night. Denies any N/V x this time, but reports that she did throw up earlier. She was dizzy earlier and had to leave work. The patient reports points to her genealized abdominal region in pain , but states that it also localizes to the right

## 2016-12-25 NOTE — ED Notes (Addendum)
Sleepy, lethargic, preoccupied with sleep, arousable to voice, NAD, calm, interactive, resps e/u, speaking in clear complete sentences, no dyspnea noted, skin W&D, VSS, c/o abd pain and vomited x2 today, usual sickle cell pain is "all over", (denies: sob, nausea, dizziness). Family at [facility].

## 2016-12-25 NOTE — ED Notes (Signed)
EDP at [facility], alert, NAD, calm, cooperative.

## 2016-12-25 NOTE — ED Notes (Signed)
EDP into room, prior to RN assessment, see MD notes, orders received and initiated.   

## 2016-12-25 NOTE — ED Notes (Addendum)
Dr Criss Alvine aware of Hgb of 4.7 Reticulocyte count ordered.

## 2016-12-25 NOTE — ED Provider Notes (Signed)
[facility] Provider Note   CSN: [id number] Arrival date & time: 12/25/16  2031     History   Chief Complaint Chief Complaint  Patient presents with  . Abdominal Pain    HPI Pamela Hudson is a 31 y.o. female with a history of sickle cell anemia, leukocytosis, CVA who presents to the emergency department today for abdominal pain and lightheadedness. Patient notes that over the last several days she has had generalized crampy abdominal pain that she feels is related to upcoming menstrual cycle. However, last night immediatly after engaging in sexual activity she started having more localized right lower quadrant abdominal pain. She describes the pain as sharp pain comes in waves, every few hours and lasts 15-20 minutes. The patient has been taking over the counter medication for this without relief. Does not feel is related to sickle cell crisis as her pain is normally all over and not located just in her abdomen. Notes she has history of right ovarian cyst. Patients pain has been increasing in severity and bouts of pain have been getting closer together. She has had two episodes of non-bilious, non-bloody emesis today, last occurring around 5pm. While at work earlier today, upon standing she became lightheaded upon standing and notes that she saw spots in her vision. This resolved upon sitting and has not reoccurred. No syncopal episodes. She notes she always has urinary frequency but otherwise denies urinary symptoms including dysuria, flank pain, urgency, or hematuria. No fever, chills, chest pain, shortness of breath, cough, hemoptysis, diarrhea, hematochezia, melena, vaginal pain, vaginal discharge, vaginal bleeding. Sexually active with one partner and always uses protection. Not on birth control. LMP mid September. Patient has history of cholecystectomy in the past. Wanted me to note she did did just receive flu vaccine on Wednesday.   HPI  Past Medical History:  Diagnosis  Date  . CVA (cerebral infarction) 02/2002  . Sickle cell anemia [facility])     Patient Active Problem List   Diagnosis Date Noted  . Pneumonia 11/26/2015  . Sickle cell anemia (HCC) 11/26/2015  . Elevated bilirubin 11/26/2015  . Serum calcium elevated 11/26/2015  . Hyponatremia 11/26/2015  . Leukocytosis 11/26/2015  . CVA (cerebral infarction) 02/13/2012    Past Surgical History:  Procedure Laterality Date  . CHOLECYSTECTOMY    . LIVER BIOPSY  2007  . PORTACATH PLACEMENT      OB History    No data available       Home Medications    Prior to Admission medications   Medication Sig Start Date End Date Taking? Authorizing Provider  Deferasirox (JADENU) 360 MG TABS Take 1,080 mg by mouth daily.    [provider]  folic acid (FOLVITE) 1 MG tablet Take 1 mg by mouth daily. 11/21/15   [provider]  hydroxyurea (HYDREA) 500 MG capsule Take 1,500-2,000 mg by mouth every other day. May take with food to minimize GI side effects.     [provider]  ibuprofen (ADVIL,MOTRIN) 800 MG tablet Take 1 tablet (800 mg total) by mouth every 8 (eight) hours as needed. 08/01/16   Long, Arlyss Repress, MD  methocarbamol (ROBAXIN) 500 MG tablet Take 1 tablet (500 mg total) by mouth 2 (two) times daily. 07/05/16   Demetrios Loll T, PA-C  metoprolol succinate (TOPROL XL) 25 MG 24 hr tablet Take 0.5 tablets (12.5 mg total) by mouth daily. 11/29/15   Quentin Angst, MD  naproxen (NAPROSYN) 375 MG tablet Take 1 tablet (375 mg  total) by mouth 2 (two) times daily. 07/05/16   Rise Mu, PA-C  progesterone (PROMETRIUM) 200 MG capsule Take 200 mg by mouth at bedtime. 11/19/15 11/18/16  [provider]  Vitamin D, Ergocalciferol, (DRISDOL) 50000 units CAPS capsule Take 50,000 capsules by mouth every Monday. 11/17/15   [provider]    Family History History reviewed. No pertinent family history.  Social History Social History  Substance Use Topics  .  Smoking status: Former Games developer  . Smokeless tobacco: Never Used  . Alcohol use Yes     Comment: occasion     Allergies   Patient has no known allergies.   Review of Systems Review of Systems  All other systems reviewed and are negative.    Physical Exam Updated Vital Signs BP (!) 118/56 (BP Location: Left Arm)   Pulse 94   Temp 98.2 F (36.8 C) (Oral)   Resp 20   Ht  (1.499 m)   Wt 76.7 kg (169 lb)   LMP 11/13/2016   SpO2 98%   BMI 34.13 kg/m   Physical Exam  Constitutional: She appears well-developed and well-nourished.  HENT:  Head: Normocephalic and atraumatic.  Right Ear: External ear normal.  Left Ear: External ear normal.  Nose: Nose normal.  Mouth/Throat: Uvula is midline, oropharynx is clear and moist and mucous membranes are normal. No tonsillar exudate.  Eyes: Pupils are equal, round, and reactive to light. Right eye exhibits no discharge. Left eye exhibits no discharge. Scleral icterus is present.  Neck: Trachea normal. Neck supple. No spinous process tenderness present. No neck rigidity. Normal range of motion present.  Cardiovascular: Normal rate, regular rhythm and intact distal pulses.   No murmur heard. Pulses:      Radial pulses are 2+ on the right side, and 2+ on the left side.       Dorsalis pedis pulses are 2+ on the right side, and 2+ on the left side.       Posterior tibial pulses are 2+ on the right side, and 2+ on the left side.  No lower extremity swelling or edema. Calves symmetric in size bilaterally.  Pulmonary/Chest: Effort normal and breath sounds normal. She exhibits no tenderness.  Abdominal: Soft. She exhibits no distension, no pulsatile liver and no pulsatile midline mass. Bowel sounds are decreased. There is generalized tenderness and tenderness in the right lower quadrant. There is no rigidity, no rebound and no CVA tenderness.  Umbilical hernia, reducible. Negative Rovsing's, Psoas and Obturator signs.  Genitourinary:    Genitourinary Comments: Exam performed by Jacinto Halim, exam chaperoned Pelvic exam: normal external genitalia without evidence of trauma. VULVA: normal appearing vulva with no masses, tenderness or lesion. VAGINA: normal appearing vagina with normal color and discharge, no lesions. CERVIX: normal appearing cervix without lesions, cervical motion tenderness absent, cervical os closed with out purulent discharge; no vaginal discharge. Wet prep and DNA probe for chlamydia and GC obtained.   ADNEXA: normal adnexa in size, nontender and no masses UTERUS: uterus is normal size, shape, consistency and nontender.   Musculoskeletal: She exhibits no edema.  Lymphadenopathy:    She has no cervical adenopathy.  Neurological: She is alert.  Speech clear. Follows commands. No facial droop. PERRLA. EOM grossly intact. CN III-XII grossly intact. Grossly moves all extremities 4 without ataxia. Able and appropriate strength for age to upper and lower extremities bilaterally including grip strength.   Skin: Skin is warm and dry. No rash noted. She is not  diaphoretic.  Psychiatric: She has a normal mood and affect.  Nursing note and vitals reviewed.   ED Treatments / Results  Labs (all labs ordered are listed, but only abnormal results are displayed) Labs Reviewed  COMPREHENSIVE METABOLIC PANEL - Abnormal; Notable for the following:       Result Value   Sodium 132 (*)    Glucose, Bld 140 (*)    Calcium 11.8 (*)    Total Protein 8.7 (*)    AST 103 (*)    ALT 74 (*)    Alkaline Phosphatase 151 (*)    Total Bilirubin 11.1 (*)    All other components within normal limits  CBC - Abnormal; Notable for the following:    WBC 40.4 (*)    RBC 1.23 (*)    Hemoglobin 4.7 (*)    HCT 13.3 (*)    MCV 108.1 (*)    MCH 38.2 (*)    RDW 17.2 (*)    All other components within normal limits  CBG MONITORING, ED - Abnormal; Notable for the following:    Glucose-Capillary 137 (*)    All other components  within normal limits  LIPASE, BLOOD  URINALYSIS, ROUTINE W REFLEX MICROSCOPIC  PREGNANCY, URINE  RETICULOCYTES  I-STAT CG4 LACTIC ACID, ED    EKG  EKG Interpretation  Date/Time:  Saturday December 25 2016 21:03:58 EDT Ventricular Rate:  108 PR Interval:  146 QRS Duration: 82 QT Interval:  322 QTC Calculation: 431 R Axis:   35 Text Interpretation:  Sinus tachycardia Cannot rule out Anterior infarct , age undetermined Abnormal ECG rate is faster, otherwise no significant change since May 2018 Confirmed by Pricilla Loveless [phone]) on 12/25/2016 10:53:29 PM       Radiology No results found.  Procedures Procedures (including critical care time)  Medications Ordered in ED Medications - No data to display   Initial Impression / Assessment and Plan / ED Course  I have reviewed the triage vital signs and the nursing notes.  Pertinent labs & imaging results that were available during my care of the patient were reviewed by me and considered in my medical decision making (see chart for details).     31 year old female with history of sickle cell anemia and leukocytosis presenting to the emergency department today for generalized abdominal pain greatest in RLQ and lightheadedness. Generalized pain began several days ago, but RLQ pain began last night. On presentation the patient is without fever, tachypnea, hypoxia or tachycardia. Her diastolic blood pressure is mildly soft at 56 mmHg but systolic BP have been stable in the 110's and 120's. Triage note says the patient has been preoccupied with sleeping and lethargic but at time of H&P, patient is awake and alert on my exam. Labs drawn in triage show patient with low Hgb (4.7 today vs 7.0 on 08/01/16). Appears to be symptomatic as patient reports lightheadedness earlier today. Exam shows known scleral icterus, reducible umbilical hernia, and generalized abdominal pain, greatest in RLQ. The patient has no distension or peritoneal signs.  Pelvic exam unremarkable and without d/c, CMT, or adnexal TTP on the right or left side. Pregnancy test negative which makes ruptured ectopic unlikely. Discussed this case with Dr. Criss Alvine who also saw the patient and recommended CT imaging to evaluate. Labs with leukocytosis of 40.4, however the patient has history of leukocytosis (baseline appears to be around 30). Retic ct 23 and does not suggest aplastic crisis. Mild hyperglycemia. No anion gap acidosis to suggest DKA. There is  no AKI. UA with signs of possible UTI with + nitrates. The patient does not have CVA TTP or fevers. Awaiting remaining labs and CT scan, case signed out to Dr. Wilkie Aye with anticipation of admission for tranfusion.   Final Clinical Impressions(s) / ED Diagnoses   Final diagnoses:  None    New Prescriptions New Prescriptions   No medications on file     Princella Pellegrini 12/26/16 1018    Pricilla Loveless, MD 12/29/16 1409

## 2016-12-26 ENCOUNTER — Encounter (HOSPITAL_COMMUNITY): Payer: Self-pay | Admitting: Obstetrics and Gynecology

## 2016-12-26 ENCOUNTER — Other Ambulatory Visit: Payer: Self-pay | Admitting: Obstetrics and Gynecology

## 2016-12-26 ENCOUNTER — Inpatient Hospital Stay (HOSPITAL_COMMUNITY): Payer: Medicare Other

## 2016-12-26 DIAGNOSIS — N83209 Unspecified ovarian cyst, unspecified side: Secondary | ICD-10-CM | POA: Diagnosis present

## 2016-12-26 DIAGNOSIS — K429 Umbilical hernia without obstruction or gangrene: Secondary | ICD-10-CM | POA: Diagnosis present

## 2016-12-26 DIAGNOSIS — Z87891 Personal history of nicotine dependence: Secondary | ICD-10-CM | POA: Diagnosis not present

## 2016-12-26 DIAGNOSIS — N200 Calculus of kidney: Secondary | ICD-10-CM | POA: Diagnosis present

## 2016-12-26 DIAGNOSIS — I517 Cardiomegaly: Secondary | ICD-10-CM | POA: Diagnosis present

## 2016-12-26 DIAGNOSIS — Z8673 Personal history of transient ischemic attack (TIA), and cerebral infarction without residual deficits: Secondary | ICD-10-CM | POA: Diagnosis not present

## 2016-12-26 DIAGNOSIS — D649 Anemia, unspecified: Secondary | ICD-10-CM | POA: Diagnosis present

## 2016-12-26 DIAGNOSIS — Z9049 Acquired absence of other specified parts of digestive tract: Secondary | ICD-10-CM | POA: Diagnosis not present

## 2016-12-26 DIAGNOSIS — D571 Sickle-cell disease without crisis: Secondary | ICD-10-CM | POA: Diagnosis present

## 2016-12-26 DIAGNOSIS — N390 Urinary tract infection, site not specified: Secondary | ICD-10-CM | POA: Diagnosis present

## 2016-12-26 LAB — COMPREHENSIVE METABOLIC PANEL
ALBUMIN: 3.3 g/dL — AB (ref 3.5–5.0)
ALK PHOS: 147 U/L — AB (ref 38–126)
ALT: 66 U/L — AB (ref 14–54)
AST: 87 U/L — ABNORMAL HIGH (ref 15–41)
Anion gap: 9 (ref 5–15)
BUN: 17 mg/dL (ref 6–20)
CO2: 19 mmol/L — ABNORMAL LOW (ref 22–32)
CREATININE: 0.89 mg/dL (ref 0.44–1.00)
Calcium: 10.8 mg/dL — ABNORMAL HIGH (ref 8.9–10.3)
Chloride: 105 mmol/L (ref 101–111)
Glucose, Bld: 106 mg/dL — ABNORMAL HIGH (ref 65–99)
POTASSIUM: 4.6 mmol/L (ref 3.5–5.1)
SODIUM: 133 mmol/L — AB (ref 135–145)
TOTAL PROTEIN: 7.9 g/dL (ref 6.5–8.1)
Total Bilirubin: 11.8 mg/dL — ABNORMAL HIGH (ref 0.3–1.2)

## 2016-12-26 LAB — FIBRINOGEN: FIBRINOGEN: 294 mg/dL (ref 210–475)

## 2016-12-26 LAB — CBC
HCT: 11.6 % — ABNORMAL LOW (ref 36.0–46.0)
HEMATOCRIT: 26.6 % — AB (ref 36.0–46.0)
HEMOGLOBIN: 4.3 g/dL — AB (ref 12.0–15.0)
Hemoglobin: 9.3 g/dL — ABNORMAL LOW (ref 12.0–15.0)
MCH: 39 pg — ABNORMAL HIGH (ref 26.0–34.0)
MCH: 32.6 pg (ref 26.0–34.0)
MCHC: 35.3 g/dL (ref 30.0–36.0)
MCHC: 35 g/dL (ref 30.0–36.0)
MCV: 110.5 fL — ABNORMAL HIGH (ref 78.0–100.0)
MCV: 93.3 fL (ref 78.0–100.0)
PLATELETS: 242 10*3/uL (ref 150–400)
Platelets: 301 10*3/uL (ref 150–400)
RBC: 1.05 MIL/uL — AB (ref 3.87–5.11)
RBC: 2.85 MIL/uL — AB (ref 3.87–5.11)
RDW: 17.9 % — ABNORMAL HIGH (ref 11.5–15.5)
RDW: 22.3 % — AB (ref 11.5–15.5)
WBC: 48.4 10*3/uL — ABNORMAL HIGH (ref 4.0–10.5)
WBC: 33.5 10*3/uL — AB (ref 4.0–10.5)

## 2016-12-26 LAB — PREPARE RBC (CROSSMATCH)

## 2016-12-26 LAB — APTT: APTT: 41 s — AB (ref 24–36)

## 2016-12-26 LAB — HEMOGLOBIN AND HEMATOCRIT, BLOOD
HCT: 12.2 % — ABNORMAL LOW (ref 36.0–46.0)
Hemoglobin: 4.2 g/dL — CL (ref 12.0–15.0)

## 2016-12-26 LAB — RETICULOCYTES: RBC.: 1.27 MIL/uL — AB (ref 3.87–5.11)

## 2016-12-26 LAB — PROTIME-INR
INR: 1.31
PROTHROMBIN TIME: 16.2 s — AB (ref 11.4–15.2)

## 2016-12-26 MED ORDER — MORPHINE SULFATE (PF) 4 MG/ML IV SOLN
4.0000 mg | Freq: Once | INTRAVENOUS | Status: AC
  Administered 2016-12-26: 4 mg via INTRAVENOUS
  Filled 2016-12-26: qty 1

## 2016-12-26 MED ORDER — ONDANSETRON HCL 4 MG PO TABS: 4.0000 mg | ORAL_TABLET | Freq: Four times a day (QID) | ORAL | Status: DC | PRN

## 2016-12-26 MED ORDER — ONDANSETRON HCL 4 MG/2ML IJ SOLN
4.0000 mg | Freq: Once | INTRAMUSCULAR | Status: AC
  Administered 2016-12-26: 4 mg via INTRAVENOUS
  Filled 2016-12-26: qty 2

## 2016-12-26 MED ORDER — SODIUM CHLORIDE 0.9% FLUSH
10.0000 mL | Freq: Two times a day (BID) | INTRAVENOUS | Status: DC
  Administered 2016-12-27: 10 mL

## 2016-12-26 MED ORDER — SODIUM CHLORIDE 0.9 % IV SOLN
INTRAVENOUS | Status: DC
  Administered 2016-12-26: 04:00:00 via INTRAVENOUS

## 2016-12-26 MED ORDER — PNEUMOCOCCAL VAC POLYVALENT 25 MCG/0.5ML IJ INJ
0.5000 mL | INJECTION | INTRAMUSCULAR | Status: DC
  Filled 2016-12-26: qty 0.5

## 2016-12-26 MED ORDER — DIPHENHYDRAMINE HCL 25 MG PO CAPS
25.0000 mg | ORAL_CAPSULE | Freq: Once | ORAL | Status: AC
Start: 2016-12-26 — End: 2016-12-26
  Administered 2016-12-26: 25 mg via ORAL
  Filled 2016-12-26: qty 1

## 2016-12-26 MED ORDER — ACETAMINOPHEN 325 MG PO TABS
650.0000 mg | ORAL_TABLET | Freq: Once | ORAL | Status: AC
  Administered 2016-12-26: 650 mg via ORAL
  Filled 2016-12-26: qty 2

## 2016-12-26 MED ORDER — PRENATAL MULTIVITAMIN CH: 1.0000 | ORAL_TABLET | Freq: Every day | ORAL | Status: DC

## 2016-12-26 MED ORDER — SODIUM CHLORIDE 0.9 % IV BOLUS (SEPSIS)
1000.0000 mL | Freq: Once | INTRAVENOUS | Status: AC
  Administered 2016-12-26: 1000 mL via INTRAVENOUS

## 2016-12-26 MED ORDER — SODIUM CHLORIDE 0.9% FLUSH
10.0000 mL | INTRAVENOUS | Status: DC | PRN
  Administered 2016-12-26 (×2): 10 mL
  Filled 2016-12-26 (×3): qty 40

## 2016-12-26 MED ORDER — TAMSULOSIN HCL 0.4 MG PO CAPS
0.4000 mg | ORAL_CAPSULE | Freq: Every day | ORAL | Status: DC
  Administered 2016-12-26 – 2016-12-27 (×2): 0.4 mg via ORAL
  Filled 2016-12-26 (×3): qty 1

## 2016-12-26 MED ORDER — HEPARIN SOD (PORK) LOCK FLUSH 100 UNIT/ML IV SOLN
500.0000 [IU] | INTRAVENOUS | Status: DC
  Filled 2016-12-26: qty 5

## 2016-12-26 MED ORDER — SENNOSIDES-DOCUSATE SODIUM 8.6-50 MG PO TABS: 1.0000 | ORAL_TABLET | Freq: Every evening | ORAL | Status: DC | PRN

## 2016-12-26 MED ORDER — IOPAMIDOL (ISOVUE-300) INJECTION 61%
100.0000 mL | Freq: Once | INTRAVENOUS | Status: AC | PRN
  Administered 2016-12-26: 100 mL via INTRAVENOUS

## 2016-12-26 MED ORDER — SODIUM CHLORIDE 0.9 % IV SOLN: Freq: Once | INTRAVENOUS | Status: DC

## 2016-12-26 MED ORDER — HYDROMORPHONE HCL 1 MG/ML IJ SOLN
0.2000 mg | INTRAMUSCULAR | Status: DC | PRN
  Administered 2016-12-26 – 2016-12-27 (×2): 0.6 mg via INTRAVENOUS
  Filled 2016-12-26 (×2): qty 1

## 2016-12-26 MED ORDER — CIPROFLOXACIN HCL 500 MG PO TABS
500.0000 mg | ORAL_TABLET | Freq: Two times a day (BID) | ORAL | Status: DC
  Administered 2016-12-26 – 2016-12-27 (×3): 500 mg via ORAL
  Filled 2016-12-26 (×5): qty 1

## 2016-12-26 MED ORDER — ONDANSETRON HCL 4 MG/2ML IJ SOLN: 4.0000 mg | Freq: Four times a day (QID) | INTRAMUSCULAR | Status: DC | PRN

## 2016-12-26 MED ORDER — HEPARIN SOD (PORK) LOCK FLUSH 100 UNIT/ML IV SOLN
500.0000 [IU] | INTRAVENOUS | Status: DC | PRN
  Administered 2016-12-27: 500 [IU]
  Filled 2016-12-26 (×3): qty 5

## 2016-12-26 NOTE — ED Provider Notes (Signed)
Patient signed out pending CT scan. In brief, patient presents with abdominal pain. Found to be more anemic than her baseline. Right lower quadrant pain that started on Monday.   CT scan concerning for large amount of intraperitoneal blood likely originating from a right hemorrhagic ovarian cyst. On recheck, patient is comfortable. She reports history of known right ovarian cyst.  She is followed in [facility] by Dr. Conard Novak; however, she is unsure whether she is still with current practice. I discussed with Dr. Delford Field, [facility] OB/GYN. Dr. Conard Novak is no longer there. However, he does feel she needs OB/GYN intervention at this time. He recommends admission to hospital service for transfusion.  Discussed with patient her preference regarding admission. Patient prefers to stay in the Hospital System. I Subsequently Discussed with Dr. Carlton Bing, OB/GYN. Will Transfer to [facility] for pelvic ultrasound, serial hemoglobins, and OB/GYN evaluation. Patient will also need transfusion. She will need type and screen upon arrival. Currently while she is symptomatic with mild tachycardia in the 112, she is not hypotensive.  Blood pressures have been stable with systolics in the 110s. Repeat CBC obtained and patient given one additional liter of fluids.  CRITICAL CARE Performed by: Shon Baton   Total critical care time: 25 minutes  Critical care time was exclusive of separately billable procedures and treating other patients.  Critical care was necessary to treat or prevent imminent or life-threatening deterioration.  Critical care was time spent personally by me on the following activities: development of treatment plan with patient and/or surrogate as well as nursing, discussions with consultants, evaluation of patient's response to treatment, examination of patient, obtaining history from patient or surrogate, ordering and performing treatments and interventions, ordering and review  of laboratory studies, ordering and review of radiographic studies, pulse oximetry and re-evaluation of patient's condition.    Shon Baton, MD 12/26/16 [id number]

## 2016-12-26 NOTE — ED Notes (Signed)
Placed on O2 2L [place] while resting. Alert, sleepy, "just woke up",  NAD, calm, interactive, resps e/u, speaking in clear complete sentences, no dyspnea noted, skin W&D, VSS, admits to continued pain "worse since CT", (denies: sob, or nausea). Family at [facility].

## 2016-12-26 NOTE — ED Notes (Signed)
Back from CT

## 2016-12-26 NOTE — Progress Notes (Signed)
Spoke with Selena Batten RN regarding PAC care.  Routine flush orders placed for maintenance of PAC.

## 2016-12-26 NOTE — H&P (Addendum)
Obstetrics & Gynecology H&P   Date of Admission: 12/26/2016   Primary OBGYN: [facility] Primary Care Provider: Patient, No Pcp Per  Reason for Admission: hemorrhagic cyst, anemia  History of Present Illness: Ms. Speich is a 31 y.o.  (Patient's last menstrual period was 11/13/2016.), with the above CC. PMHx is significant for Prescott disease, chronic anemia, PCOS, h/o CVA  Pt with abdominal pain and CT scan showed 6cm RO complex cyst and FF in the pelvis consistent hemorrhagic cyst. WF with no bedspace so [facility] called and pt accepted.   Currently with minimal pain and resting comfortably. No nausea, vomiting, chest pain, sob, HA.    ROS: A 12-point review of systems was performed and negative, except as stated in the above HPI.  OBGYN History: As per HPI. OB History  No data available    Past Medical History: Past Medical History:  Diagnosis Date  . Cardiomegaly   . Chronic pain   . CVA (cerebral infarction) 02/2002  . Liver disease    ?iron  . PCOS (polycystic ovarian syndrome)   . Renal stone   . Sickle cell anemia (HCC)     Past Surgical History: Past Surgical History:  Procedure Laterality Date  . CHOLECYSTECTOMY    . LIVER BIOPSY  2007  . PORTACATH PLACEMENT      Family History:  History reviewed. No pertinent family history.  Social History:  Social History   Social History  . Marital status: Single    Spouse name: N/A  . Number of children: N/A  . Years of education: N/A   Occupational History  . Not on file.   Social History Main Topics  . Smoking status: Former Games developer  . Smokeless tobacco: Never Used  . Alcohol use Yes     Comment: occasion  . Drug use: No  . Sexual activity: Yes    Birth control/ protection: None   Other Topics Concern  . Not on file   Social History Narrative  . No narrative on file     Allergy: No Known Allergies  Current Outpatient Medications: Prescriptions Prior to Admission  Medication Sig Dispense Refill Last  Dose  . Deferasirox (JADENU) 360 MG TABS Take 1,080 mg by mouth daily.     . folic acid (FOLVITE) 1 MG tablet Take 1 mg by mouth daily.   11/21/2015  . hydroxyurea (HYDREA) 500 MG capsule Take 1,500-2,000 mg by mouth every other day. May take with food to minimize GI side effects.    11/21/2015  . ibuprofen (ADVIL,MOTRIN) 800 MG tablet Take 1 tablet (800 mg total) by mouth every 8 (eight) hours as needed. 21 tablet 0   . methocarbamol (ROBAXIN) 500 MG tablet Take 1 tablet (500 mg total) by mouth 2 (two) times daily. 14 tablet 0   . metoprolol succinate (TOPROL XL) 25 MG 24 hr tablet Take 0.5 tablets (12.5 mg total) by mouth daily. 30 tablet 1   . naproxen (NAPROSYN) 375 MG tablet Take 1 tablet (375 mg total) by mouth 2 (two) times daily. 10 tablet 0   . progesterone (PROMETRIUM) 200 MG capsule Take 200 mg by mouth at bedtime.   not started yet  . Vitamin D, Ergocalciferol, (DRISDOL) 50000 units CAPS capsule Take 50,000 capsules by mouth every Monday.  11 11/17/2015     Hospital Medications: Current Facility-Administered Medications  Medication Dose Route Frequency Provider Last Rate Last Dose  . 0.9 %  sodium chloride infusion   Intravenous Once Julian Bing, MD      .  0.9 %  sodium chloride infusion   Intravenous Continuous Le Claire Bing, MD 100 mL/hr at 12/26/16 0420    . ciprofloxacin (CIPRO) tablet 500 mg  500 mg Oral Q12H [place] Bing, MD      . HYDROmorphone (DILAUDID) injection 0.2-0.6 mg  0.2-0.6 mg Intravenous Q2H PRN [place] Bing, MD      . ondansetron (ZOFRAN) tablet 4 mg  4 mg Oral Q6H PRN [place] Bing, MD       Or  . ondansetron (ZOFRAN) injection 4 mg  4 mg Intravenous Q6H PRN [place] Bing, MD      . Melene Muller ON 12/27/2016] pneumococcal 23 valent vaccine (PNU-IMMUNE) injection 0.5 mL  0.5 mL Intramuscular Tomorrow-1000 [place] Bing, MD      . Melene Muller ON 12/27/2016] senna-docusate (Senokot-S) tablet 1 tablet  1 tablet Oral QHS PRN [place] Bing, MD      .  tamsulosin (FLOMAX) capsule 0.4 mg  0.4 mg Oral Daily [place] Bing, MD         Physical Exam:   Current Vital Signs 24h Vital Sign Ranges  T 98.3 F (36.8 C) Temp  Avg: 98.3 F (36.8 C)  Min: 98.2 F (36.8 C)  Max: 98.3 F (36.8 C)  BP (!) 123/58 BP  Min: 114/47  Max: 131/60  HR (!) 116 Pulse  Avg: 107.4  Min: 89  Max: 120  RR (!) 22 Resp  Avg: 24.1  Min: 16  Max: 32  SaO2 100 % Nasal Cannula SpO2  Avg: 94.2 %  Min: 85 %  Max: 100 %       24 Hour I/O Current Shift I/O  Time Ins Outs 10/13 0701 - 10/14 0700 In: 1500 [P.O.:500; I.V.:1000] Out: 14 [Urine:14] No intake/output data recorded.   Patient Vitals for the past 24 hrs:  BP Temp Temp src Pulse Resp SpO2 Height Weight  12/26/16 0605 (!) 123/58 98.3 F (36.8 C) Oral (!) 116 (!) 22 100 % - -  12/26/16 0539 131/60 98.3 F (36.8 C) Oral (!) 118 20 96 % - -  12/26/16 0402 (!) 125/49 98.3 F (36.8 C) Oral (!) 120 16 92 % - -  12/26/16 0230 (!) 116/57 - - (!) 108 (!) 22 96 % - -  12/26/16 0200 117/65 - - (!) 109 (!) 21 92 % - -  12/26/16 0145 - - - (!) 111 (!) 25 92 % - -  12/26/16 0130 121/62 - - (!) 114 (!) 32 (!) 85 % - -  12/26/16 0030 (!) 114/47 - - - (!) 27 - - -  12/26/16 0000 (!) 114/50 - - - (!) 26 - - -  12/25/16 2353 (!) 119/51 - - (!) 111 (!) 26 100 % - -  12/25/16 2330 (!) 119/51 - - - (!) 25 - - -  12/25/16 2300 (!) 115/45 - - 91 (!) 26 94 % - -  12/25/16 2230 124/62 - - 89 (!) 30 91 % - -  12/25/16 2049 (!) 118/56 98.2 F (36.8 C) Oral 94 20 98 % - -  12/25/16 2044 - - - - - -  (1.499 m) 169 lb (76.7 kg)    Body mass index is 34.13 kg/m. General appearance: Well nourished, well developed female in no acute distress.  Neck:  Supple, normal appearance, and no thyromegaly  Cardiovascular: S1, S2 normal, no murmur, rub or gallop, regular rate and rhythm. Mildly tach to the 100s Respiratory:  Clear to auscultation bilateral. Normal respiratory effort Abdomen:  positive bowel sounds and no masses,  hernias; diffusely non tender to palpation, non distended Neuro/Psych:  Normal mood and affect.  Skin:  Warm and dry.  Extremities: no clubbing, cyanosis, or edema.   ED pelvic exam negative   Laboratory: B POS Pending: GC/CT, ionized calcium Negative: UPT, wet prep, lipase + u/a for nitrites  Recent Labs Lab 12/25/16 2205 12/26/16 0220 12/26/16 0412  WBC 40.4*  --  48.4*  HGB 4.7* 4.2* 4.3*  HCT 13.3* 12.2* 11.6*  PLT 278  --  242  Her baseline hgb is 7-8   Recent Labs Lab 12/25/16 2205 12/26/16 0412  NA 132* 133*  K 5.0 4.6  CL 104 105  CO2 22 19*  BUN 18 17  CREATININE 0.92 0.89  CALCIUM 11.8* 10.8*  PROT 8.7* 7.9  BILITOT 11.1* 11.8*  ALKPHOS 151* 147*  ALT 74* 66*  AST 103* 87*  GLUCOSE 140* 106*    Recent Labs Lab 12/26/16 0412  APTT 41*  INR 1.31  Fibrinogen 294  Recent Labs Lab 12/26/16 0412  ABORH B POS    Imaging:  CLINICAL DATA:  Acute onset of right lower quadrant abdominal pain and leukocytosis. Current history of sickle cell disease. Initial encounter.  EXAM: CT ABDOMEN AND PELVIS WITH CONTRAST  TECHNIQUE: Multidetector CT imaging of the abdomen and pelvis was performed using the standard protocol following bolus administration of intravenous contrast.  CONTRAST:  ISOVUE-300 IOPAMIDOL (ISOVUE-300) INJECTION 61%  COMPARISON:  CT of the abdomen and pelvis from 03/02/2016  FINDINGS: Lower chest: Mild left basilar atelectasis is noted. The heart is mildly enlarged.  Hepatobiliary: The liver is enlarged, measuring 24.8 cm in length. It extends nearly to the iliac crest. The patient is status post cholecystectomy, with clips noted at the gallbladder fossa. The common bile duct remains normal in caliber.  Pancreas: The pancreas is within normal limits. Enlarged high-density peripancreatic nodes are seen, measuring up to 2.1 cm in short axis. These nodes have increased mildly in size since 2014, and may reflect  hemosiderosis secondary to the patient's sickle cell disease.  Spleen: The spleen is atrophic and grossly unremarkable.  Adrenals/Urinary Tract: The adrenal glands are unremarkable in appearance. A 5 mm stone is noted at the lower pole of the right kidney. There is no evidence of hydronephrosis. No obstructing ureteral stones are identified. No perinephric stranding is seen.  Stomach/Bowel: The stomach is unremarkable in appearance. The small bowel is within normal limits. The appendix is not visualized; there is no evidence for appendicitis. The colon is unremarkable in appearance.  Vascular/Lymphatic: The abdominal aorta is unremarkable in appearance. The inferior vena cava is grossly unremarkable. No additional retroperitoneal lymphadenopathy is seen. No pelvic sidewall lymphadenopathy is identified.  Reproductive: The bladder is mildly distended and grossly unremarkable.  There appears to be a complex 6.2 cm right adnexal cystic lesion, with a large amount of surrounding blood tracking about the pelvis, and additional small volume mildly high-density ascites within the abdomen, concerning for diffuse intraperitoneal hemorrhage. Given the negative urine pregnancy test, this is most suspicious for hemorrhage from a right adnexal hemorrhagic cyst.  Other: A small to moderate periumbilical hernia is noted, containing only fat, with mild surrounding soft tissue inflammation.  Musculoskeletal: No acute osseous abnormalities are identified. The visualized musculature is unremarkable in appearance.  IMPRESSION: 1. Complex 6.2 cm right adnexal cystic lesion, with a large amount of surrounding blood extending about the pelvis. Additional small volume mildly high-density ascites within the abdomen  is concerning for diffuse intraperitoneal hemorrhage. This likely explains the patient's hemoglobin of 4.7. Given the negative urine pregnancy test, this is most suspicious for  significant hemorrhage from a right adnexal hemorrhagic cyst. 2. Hepatomegaly. 3. Small to moderate bilateral hernia, containing only fat, with mild surrounding soft tissue inflammation. 4. 5 mm nonobstructing stone at the lower pole of the right kidney. 5. Enlarged high-density peripancreatic nodes measure up to 2.1 cm in short axis. 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 6. Mild cardiomegaly. Critical Value/emergent results were called by telephone at the time of interpretation on 12/26/2016 at 1:40 am to Dr. Wilkie Aye, who verbally acknowledged these results.   Electronically Signed   By: Roanna Raider M.D.   On: 12/26/2016 01:46  Assessment: Ms. Ringel is a 31 y.o. stable with hemorrhagic cyst  Plan: Transfuse 3U PRBC and then check CBC. U/s for sometime later today Follow up ionized calcium Consult hospitalist prn cipro for UTI. No prior cultures + in the system flomax for stone  Total time taking care of the patient was 30 minutes, with greater than 50% of the time spent in face to face interaction with the patient.  Cornelia Copa MD Attending Center for [facility])

## 2016-12-26 NOTE — ED Notes (Signed)
Contacted Dr. Dahlia Bailiff of [facility] -- [phone]

## 2016-12-27 DIAGNOSIS — D649 Anemia, unspecified: Principal | ICD-10-CM

## 2016-12-27 DIAGNOSIS — N83209 Unspecified ovarian cyst, unspecified side: Secondary | ICD-10-CM

## 2016-12-27 LAB — TYPE AND SCREEN
ABO/RH(D): B POS
Antibody Screen: NEGATIVE
UNIT DIVISION: 0
UNIT DIVISION: 0
Unit division: 0
Unit division: 0

## 2016-12-27 LAB — BPAM RBC
BLOOD PRODUCT EXPIRATION DATE: 201810302359
BLOOD PRODUCT EXPIRATION DATE: 201811022359
Blood Product Expiration Date: 201810292359
Blood Product Expiration Date: 201810302359
ISSUE DATE / TIME: 201810140528
ISSUE DATE / TIME: 201810140846
ISSUE DATE / TIME: 201810141103
UNIT TYPE AND RH: 1700
UNIT TYPE AND RH: 5100
UNIT TYPE AND RH: 5100
UNIT TYPE AND RH: 5100

## 2016-12-27 LAB — GC/CHLAMYDIA PROBE AMP (~~LOC~~) NOT AT ARMC
CHLAMYDIA, DNA PROBE: NEGATIVE
NEISSERIA GONORRHEA: NEGATIVE

## 2016-12-27 MED ORDER — CIPROFLOXACIN HCL 500 MG PO TABS: 500.0000 mg | ORAL_TABLET | Freq: Two times a day (BID) | ORAL | 0 refills | Status: DC

## 2016-12-27 MED ORDER — PNEUMOCOCCAL VAC POLYVALENT 25 MCG/0.5ML IJ INJ
0.5000 mL | INJECTION | INTRAMUSCULAR | Status: DC
  Filled 2016-12-27: qty 0.5

## 2016-12-27 MED ORDER — TAMSULOSIN HCL 0.4 MG PO CAPS: 0.4000 mg | ORAL_CAPSULE | Freq: Every day | ORAL | 0 refills | Status: DC

## 2016-12-27 MED ORDER — HYDROMORPHONE HCL 2 MG PO TABS: 2.0000 mg | ORAL_TABLET | ORAL | 0 refills | Status: DC | PRN

## 2016-12-27 MED ORDER — IBUPROFEN 200 MG PO TABS: 600.0000 mg | ORAL_TABLET | Freq: Four times a day (QID) | ORAL | 3 refills | Status: DC | PRN

## 2016-12-27 MED ORDER — ONDANSETRON HCL 4 MG PO TABS: 4.0000 mg | ORAL_TABLET | Freq: Four times a day (QID) | ORAL | 0 refills | Status: DC | PRN

## 2016-12-27 MED ORDER — HYDROMORPHONE HCL 2 MG PO TABS
2.0000 mg | ORAL_TABLET | ORAL | Status: DC | PRN
  Administered 2016-12-27: 2 mg via ORAL
  Filled 2016-12-27: qty 1

## 2016-12-27 NOTE — Care Management Important Message (Signed)
Important Message  Patient Details  Name: Pamela Hudson MRN: [id number] Date of Birth: 1986/02/12   Medicare Important Message Given:       Renie Ora 12/27/2016, 12:43 PM

## 2016-12-27 NOTE — Progress Notes (Signed)
Discharge teaching complete with pt. Pt understood all information and did not have any questions. Pt discharged home.  

## 2016-12-27 NOTE — Discharge Instructions (Signed)
Ovarian Cyst An ovarian cyst is a fluid-filled sac that forms on an ovary. The ovaries are small organs that produce eggs in women. Various types of cysts can form on the ovaries. Some may cause symptoms and require treatment. Most ovarian cysts go away on their own, are not cancerous (are benign), and do not cause problems. Common types of ovarian cysts include:  Functional (follicle) cysts. ? Occur during the menstrual cycle, and usually go away with the next menstrual cycle if you do not get pregnant. ? Usually cause no symptoms.  Endometriomas. ? Are cysts that form from the tissue that lines the uterus (endometrium). ? Are sometimes called chocolate cysts because they become filled with blood that turns brown. ? Can cause pain in the lower abdomen during intercourse and during your period.  Cystadenoma cysts. ? Develop from cells on the outside surface of the ovary. ? Can get very large and cause lower abdomen pain and pain with intercourse. ? Can cause severe pain if they twist or break open (rupture).  Dermoid cysts. ? Are sometimes found in both ovaries. ? May contain different kinds of body tissue, such as skin, teeth, hair, or cartilage. ? Usually do not cause symptoms unless they get very big.  Theca lutein cysts. ? Occur when too much of a certain hormone (human chorionic gonadotropin) is produced and overstimulates the ovaries to produce an egg. ? Are most common after having procedures used to assist with the conception of a baby (in vitro fertilization).  What are the causes? Ovarian cysts may be caused by:  Ovarian hyperstimulation syndrome. This is a condition that can develop from taking fertility medicines. It causes multiple large ovarian cysts to form.  Polycystic ovarian syndrome (PCOS). This is a common hormonal disorder that can cause ovarian cysts, as well as problems with your period or fertility.  What increases the risk? The following factors may make  you more likely to develop ovarian cysts:  Being overweight or obese.  Taking fertility medicines.  Taking certain forms of hormonal birth control.  Smoking.  What are the signs or symptoms? Many ovarian cysts do not cause symptoms. If symptoms are present, they may include:  Pelvic pain or pressure.  Pain in the lower abdomen.  Pain during sex.  Abdominal swelling.  Abnormal menstrual periods.  Increasing pain with menstrual periods.  How is this diagnosed? These cysts are commonly found during a routine pelvic exam. You may have tests to find out more about the cyst, such as:  Ultrasound.  X-ray of the pelvis.  CT scan.  MRI.  Blood tests.  How is this treated? Many ovarian cysts go away on their own without treatment. Your health care provider may want to check your cyst regularly for 2-3 months to see if it changes. If you are in menopause, it is especially important to have your cyst monitored closely because menopausal women have a higher rate of ovarian cancer. When treatment is needed, it may include:  Medicines to help relieve pain.  A procedure to drain the cyst (aspiration).  Surgery to remove the whole cyst.  Hormone treatment or birth control pills. These methods are sometimes used to help dissolve a cyst.  Follow these instructions at home:  Take over-the-counter and prescription medicines only as told by your health care provider.  Do not drive or use heavy machinery while taking prescription pain medicine.  Get regular pelvic exams and Pap tests as often as told by your health care  provider.  Return to your normal activities as told by your health care provider. Ask your health care provider what activities are safe for you.  Do not use any products that contain nicotine or tobacco, such as cigarettes and e-cigarettes. If you need help quitting, ask your health care provider.  Keep all follow-up visits as told by your health care provider.  This is important. Contact a health care provider if:  Your periods are late, irregular, or painful, or they stop.  You have pelvic pain that does not go away.  You have pressure on your bladder or trouble emptying your bladder completely.  You have pain during sex.  You have any of the following in your abdomen: ? A feeling of fullness. ? Pressure. ? Discomfort. ? Pain that does not go away. ? Swelling.  You feel generally ill.  You become constipated.  You lose your appetite.  You develop severe acne.  You start to have more body hair and facial hair.  You are gaining weight or losing weight without changing your exercise and eating habits.  You think you may be pregnant. Get help right away if:  You have abdominal pain that is severe or gets worse.  You cannot eat or drink without vomiting.  You suddenly develop a fever.  Your menstrual period is much heavier than usual. This information is not intended to replace advice given to you by your health care provider. Make sure you discuss any questions you have with your health care provider. Document Released: 03/01/2005 Document Revised: 09/19/2015 Document Reviewed: 08/03/2015 Elsevier Interactive Patient Education  2018 [employer].  Contraception Choices Contraception (birth control) is the use of any methods or devices to prevent pregnancy. Below are some methods to help avoid pregnancy. Hormonal methods  Contraceptive implant. This is a thin, plastic tube containing progesterone hormone. It does not contain estrogen hormone. Your health care provider inserts the tube in the inner part of the upper arm. The tube can remain in place for up to 3 years. After 3 years, the implant must be removed. The implant prevents the ovaries from releasing an egg (ovulation), thickens the cervical mucus to prevent sperm from entering the uterus, and thins the lining of the inside of the uterus.  Progesterone-only injections.  These injections are given every 3 months by your health care provider to prevent pregnancy. This synthetic progesterone hormone stops the ovaries from releasing eggs. It also thickens cervical mucus and changes the uterine lining. This makes it harder for sperm to survive in the uterus.  Birth control pills. These pills contain estrogen and progesterone hormone. They work by preventing the ovaries from releasing eggs (ovulation). They also cause the cervical mucus to thicken, preventing the sperm from entering the uterus. Birth control pills are prescribed by a health care provider.Birth control pills can also be used to treat heavy periods.  Minipill. This type of birth control pill contains only the progesterone hormone. They are taken every day of each month and must be prescribed by your health care provider.  Birth control patch. The patch contains hormones similar to those in birth control pills. It must be changed once a week and is prescribed by a health care provider.  Vaginal ring. The ring contains hormones similar to those in birth control pills. It is left in the vagina for 3 weeks, removed for 1 week, and then a new one is put back in place. The patient must be comfortable inserting and removing the  ring from the vagina.A health care provider's prescription is necessary.  Emergency contraception. Emergency contraceptives prevent pregnancy after unprotected sexual intercourse. This pill can be taken right after sex or up to 5 days after unprotected sex. It is most effective the sooner you take the pills after having sexual intercourse. Most emergency contraceptive pills are available without a prescription. Check with your pharmacist. Do not use emergency contraception as your only form of birth control. Barrier methods  Female condom. This is a thin sheath (latex or rubber) that is worn over the penis during sexual intercourse. It can be used with spermicide to increase  effectiveness.  Female condom. This is a soft, loose-fitting sheath that is put into the vagina before sexual intercourse.  Diaphragm. This is a soft, latex, dome-shaped barrier that must be fitted by a health care provider. It is inserted into the vagina, along with a spermicidal jelly. It is inserted before intercourse. The diaphragm should be left in the vagina for 6 to 8 hours after intercourse.  Cervical cap. This is a round, soft, latex or plastic cup that fits over the cervix and must be fitted by a health care provider. The cap can be left in place for up to 48 hours after intercourse.  Sponge. This is a soft, circular piece of polyurethane foam. The sponge has spermicide in it. It is inserted into the vagina after wetting it and before sexual intercourse.  Spermicides. These are chemicals that kill or block sperm from entering the cervix and uterus. They come in the form of creams, jellies, suppositories, foam, or tablets. They do not require a prescription. They are inserted into the vagina with an applicator before having sexual intercourse. The process must be repeated every time you have sexual intercourse. Intrauterine contraception  Intrauterine device (IUD). This is a T-shaped device that is put in a woman's uterus during a menstrual period to prevent pregnancy. There are 2 types: ? Copper IUD. This type of IUD is wrapped in copper wire and is placed inside the uterus. Copper makes the uterus and fallopian tubes produce a fluid that kills sperm. It can stay in place for 10 years. ? Hormone IUD. This type of IUD contains the hormone progestin (synthetic progesterone). The hormone thickens the cervical mucus and prevents sperm from entering the uterus, and it also thins the uterine lining to prevent implantation of a fertilized egg. The hormone can weaken or kill the sperm that get into the uterus. It can stay in place for 3-5 years, depending on which type of IUD is used. Permanent  methods of contraception  Female tubal ligation. This is when the woman's fallopian tubes are surgically sealed, tied, or blocked to prevent the egg from traveling to the uterus.  Hysteroscopic sterilization. This involves placing a small coil or insert into each fallopian tube. Your doctor uses a technique called hysteroscopy to do the procedure. The device causes scar tissue to form. This results in permanent blockage of the fallopian tubes, so the sperm cannot fertilize the egg. It takes about 3 months after the procedure for the tubes to become blocked. You must use another form of birth control for these 3 months.  Female sterilization. This is when the female has the tubes that carry sperm tied off (vasectomy).This blocks sperm from entering the vagina during sexual intercourse. After the procedure, the man can still ejaculate fluid (semen). Natural planning methods  Natural family planning. This is not having sexual intercourse or using a barrier method (  condom, diaphragm, cervical cap) on days the woman could become pregnant.  Calendar method. This is keeping track of the length of each menstrual cycle and identifying when you are fertile.  Ovulation method. This is avoiding sexual intercourse during ovulation.  Symptothermal method. This is avoiding sexual intercourse during ovulation, using a thermometer and ovulation symptoms.  Post-ovulation method. This is timing sexual intercourse after you have ovulated. Regardless of which type or method of contraception you choose, it is important that you use condoms to protect against the transmission of sexually transmitted infections (STIs). Talk with your health care provider about which form of contraception is most appropriate for you. This information is not intended to replace advice given to you by your health care provider. Make sure you discuss any questions you have with your health care provider. Document Released: 03/01/2005 Document  Revised: 08/07/2015 Document Reviewed: 08/24/2012 Elsevier Interactive Patient Education  2017 [employer].

## 2016-12-27 NOTE — Discharge Summary (Signed)
Physician Discharge Summary  Patient ID: Pamela Hudson MRN: [id number] DOB/AGE: [birth year] 31 y.o.  Admit date: 12/25/2016 Discharge date: 12/27/2016  Admission Diagnoses: symptomatic anemia, hemorrhagic cyst  Discharge Diagnoses:  Active Problems:   Hemorrhagic ovarian cyst   Hemorrhagic cyst of ovary   Discharged Condition: good  Hospital Course: Patient admitted due to symptomatic anemia and a large hemorrhagic ovarian cyst. Patient received 3 units pRBC. She reported significant improvement in her anemia symptoms. She reports her abdominal pain well managed with pain medication. She ambulated, tolerated regular diet. She plans to follow up with gyn in [facility]. She was previously on depo-provera for contraception and is contemplating changing birth control method. Discharge instructions reviewed with the patient. Patient was also found to have a UTI and a non-obstructive kidney stone. Prescriptions for antibiotic and flomax provided  Consults: None  Discharge Exam: Blood pressure 123/75, pulse (!) 114, temperature 100.3 F (37.9 C), temperature source Oral, resp. rate 18, height  (1.499 m), weight 169 lb (76.7 kg), last menstrual period 11/13/2016, SpO2 93 %. General appearance: alert, cooperative and no distress Resp: clear to auscultation bilaterally Cardio: regular rate and rhythm GI: soft, non-tender; bowel sounds normal; no masses,  no organomegaly Extremities: extremities normal, atraumatic, no cyanosis or edema and no edema, redness or tenderness in the calves or thighs  Disposition: 01-Home or Self Care   Allergies as of 12/27/2016      Reactions   Percocet [oxycodone-acetaminophen] Swelling   Swelling of the lip.      Medication List    STOP taking these medications   methocarbamol 500 MG tablet Commonly known as:  ROBAXIN   naproxen 375 MG tablet Commonly known as:  NAPROSYN     TAKE these medications   ciprofloxacin 500 MG tablet Commonly  known as:  CIPRO Take 1 tablet (500 mg total) by mouth every 12 (twelve) hours.   folic acid 1 MG tablet Commonly known as:  FOLVITE Take 1 mg by mouth daily.   HAIR SKIN & NAILS GUMMIES PO Take 2 each by mouth daily.   HYDROmorphone 2 MG tablet Commonly known as:  DILAUDID Take 1 tablet (2 mg total) by mouth every 4 (four) hours as needed for severe pain.   hydroxyurea 500 MG capsule Commonly known as:  HYDREA Take 1,500-2,000 mg by mouth daily. May take with food to minimize GI side effects. Alternates taking 3 and 4 tablets each day.   ibuprofen 200 MG tablet Commonly known as:  ADVIL,MOTRIN Take 3 tablets (600 mg total) by mouth every 6 (six) hours as needed for mild pain or moderate pain. What changed:  how much to take  Another medication with the same name was removed. Continue taking this medication, and follow the directions you see here.   JADENU 360 MG Tabs Generic drug:  Deferasirox Take 1,080 mg by mouth daily.   magnesium oxide 400 MG tablet Commonly known as:  MAG-OX Take 400 mg by mouth 2 (two) times daily.   metoprolol succinate 25 MG 24 hr tablet Commonly known as:  TOPROL XL Take 0.5 tablets (12.5 mg total) by mouth daily.   multivitamin with minerals Tabs tablet Take 1 tablet by mouth daily.   ondansetron 4 MG tablet Commonly known as:  ZOFRAN Take 1 tablet (4 mg total) by mouth every 6 (six) hours as needed for nausea.   tamsulosin 0.4 MG Caps capsule Commonly known as:  FLOMAX Take 1 capsule (0.4 mg total) by mouth daily.  Follow-up Information    [facility] Follow up in 4 week(s).   Specialty:  Obstetrics and Gynecology Why:  An appointment will be made for you to follo wup in 4 weeks, if you are not able to get in touch with your doctor Contact information: [address] [place] [address] [phone]          Signed: Catalina Antigua 12/27/2016, 10:11 AM

## 2016-12-28 LAB — CALCIUM, IONIZED: CALCIUM, IONIZED, SERUM: 6.4 mg/dL — AB (ref 4.5–5.6)

## 2017-05-28 ENCOUNTER — Inpatient Hospital Stay (HOSPITAL_BASED_OUTPATIENT_CLINIC_OR_DEPARTMENT_OTHER)
Admission: EM | Admit: 2017-05-28 | Discharge: 2017-05-30 | DRG: 391 | Discharge status: Against Medical Advice | Payer: Medicare Other | Attending: Internal Medicine | Admitting: Internal Medicine

## 2017-05-28 ENCOUNTER — Encounter (HOSPITAL_BASED_OUTPATIENT_CLINIC_OR_DEPARTMENT_OTHER): Payer: Self-pay | Admitting: Emergency Medicine

## 2017-05-28 ENCOUNTER — Emergency Department (HOSPITAL_BASED_OUTPATIENT_CLINIC_OR_DEPARTMENT_OTHER): Payer: Medicare Other

## 2017-05-28 ENCOUNTER — Other Ambulatory Visit: Payer: Self-pay

## 2017-05-28 DIAGNOSIS — E860 Dehydration: Secondary | ICD-10-CM | POA: Diagnosis present

## 2017-05-28 DIAGNOSIS — R740 Nonspecific elevation of levels of transaminase and lactic acid dehydrogenase [LDH]: Secondary | ICD-10-CM | POA: Diagnosis present

## 2017-05-28 DIAGNOSIS — E871 Hypo-osmolality and hyponatremia: Secondary | ICD-10-CM | POA: Diagnosis present

## 2017-05-28 DIAGNOSIS — D5700 Hb-SS disease with crisis, unspecified: Secondary | ICD-10-CM | POA: Diagnosis present

## 2017-05-28 DIAGNOSIS — D509 Iron deficiency anemia, unspecified: Secondary | ICD-10-CM | POA: Diagnosis present

## 2017-05-28 DIAGNOSIS — D571 Sickle-cell disease without crisis: Secondary | ICD-10-CM | POA: Diagnosis present

## 2017-05-28 DIAGNOSIS — N179 Acute kidney failure, unspecified: Secondary | ICD-10-CM | POA: Diagnosis present

## 2017-05-28 DIAGNOSIS — K529 Noninfective gastroenteritis and colitis, unspecified: Secondary | ICD-10-CM | POA: Diagnosis present

## 2017-05-28 DIAGNOSIS — I10 Essential (primary) hypertension: Secondary | ICD-10-CM | POA: Diagnosis present

## 2017-05-28 DIAGNOSIS — N390 Urinary tract infection, site not specified: Secondary | ICD-10-CM | POA: Diagnosis present

## 2017-05-28 DIAGNOSIS — Z79899 Other long term (current) drug therapy: Secondary | ICD-10-CM | POA: Diagnosis not present

## 2017-05-28 DIAGNOSIS — Z87891 Personal history of nicotine dependence: Secondary | ICD-10-CM | POA: Diagnosis not present

## 2017-05-28 DIAGNOSIS — G8929 Other chronic pain: Secondary | ICD-10-CM | POA: Diagnosis present

## 2017-05-28 DIAGNOSIS — Z8673 Personal history of transient ischemic attack (TIA), and cerebral infarction without residual deficits: Secondary | ICD-10-CM

## 2017-05-28 HISTORY — DX: Essential (primary) hypertension: I10

## 2017-05-28 LAB — COMPREHENSIVE METABOLIC PANEL
ALT: 119 U/L — AB (ref 14–54)
AST: 187 U/L — ABNORMAL HIGH (ref 15–41)
Albumin: 3.4 g/dL — ABNORMAL LOW (ref 3.5–5.0)
Alkaline Phosphatase: 146 U/L — ABNORMAL HIGH (ref 38–126)
Anion gap: 8 (ref 5–15)
BUN: 22 mg/dL — ABNORMAL HIGH (ref 6–20)
CO2: 15 mmol/L — AB (ref 22–32)
CREATININE: 1.14 mg/dL — AB (ref 0.44–1.00)
Calcium: 10.8 mg/dL — ABNORMAL HIGH (ref 8.9–10.3)
Chloride: 107 mmol/L (ref 101–111)
GFR calc non Af Amer: >60 mL/min (ref >60)
Glucose, Bld: 109 mg/dL — ABNORMAL HIGH (ref 65–99)
Potassium: 4.9 mmol/L (ref 3.5–5.1)
SODIUM: 130 mmol/L — AB (ref 135–145)
Total Bilirubin: 9.7 mg/dL — ABNORMAL HIGH (ref 0.3–1.2)
Total Protein: 9.3 g/dL — ABNORMAL HIGH (ref 6.5–8.1)

## 2017-05-28 LAB — URINALYSIS, MICROSCOPIC (REFLEX)

## 2017-05-28 LAB — CBC WITH DIFFERENTIAL/PLATELET
BASOS ABS: 0 10*3/uL (ref 0.0–0.1)
BASOS PCT: 0 %
Band Neutrophils: 3 %
EOS PCT: 0 %
Eosinophils Absolute: 0 10*3/uL (ref 0.0–0.7)
HEMATOCRIT: 15.1 % — AB (ref 36.0–46.0)
HEMOGLOBIN: 5.6 g/dL — AB (ref 12.0–15.0)
LYMPHS ABS: 4.3 10*3/uL — AB (ref 0.7–4.0)
LYMPHS PCT: 17 %
MCH: 37.1 pg — ABNORMAL HIGH (ref 26.0–34.0)
MCHC: 36.8 g/dL — ABNORMAL HIGH (ref 30.0–36.0)
MCV: 100.7 fL — ABNORMAL HIGH (ref 78.0–100.0)
MONOS PCT: 14 %
Monocytes Absolute: 3.5 10*3/uL — ABNORMAL HIGH (ref 0.1–1.0)
NEUTROS PCT: 66 %
Neutro Abs: 17.4 10*3/uL — ABNORMAL HIGH (ref 1.7–7.7)
Platelets: 235 10*3/uL (ref 150–400)
RBC: 1.5 MIL/uL — ABNORMAL LOW (ref 3.87–5.11)
RDW: 18.5 % — ABNORMAL HIGH (ref 11.5–15.5)
WBC: 25.2 10*3/uL — ABNORMAL HIGH (ref 4.0–10.5)

## 2017-05-28 LAB — URINALYSIS, ROUTINE W REFLEX MICROSCOPIC
GLUCOSE, UA: NEGATIVE mg/dL
KETONES UR: NEGATIVE mg/dL
Nitrite: NEGATIVE
PROTEIN: 100 mg/dL — AB
Specific Gravity, Urine: 1.02 (ref 1.005–1.030)
pH: 5.5 (ref 5.0–8.0)

## 2017-05-28 LAB — PREGNANCY, URINE: Preg Test, Ur: NEGATIVE

## 2017-05-28 LAB — RETICULOCYTES
RBC.: 1.58 MIL/uL — AB (ref 3.87–5.11)
Retic Ct Pct: >23 % — ABNORMAL HIGH (ref 0.4–3.1)

## 2017-05-28 MED ORDER — POLYETHYLENE GLYCOL 3350 17 G PO PACK: 17.0000 g | PACK | Freq: Every day | ORAL | Status: DC | PRN

## 2017-05-28 MED ORDER — SODIUM CHLORIDE 0.9 % IV SOLN
2.0000 g | Freq: Every day | INTRAVENOUS | Status: DC
  Administered 2017-05-29: 2 g via INTRAVENOUS
  Filled 2017-05-28: qty 2

## 2017-05-28 MED ORDER — KETOROLAC TROMETHAMINE 30 MG/ML IJ SOLN
30.0000 mg | Freq: Four times a day (QID) | INTRAMUSCULAR | Status: DC
  Administered 2017-05-29: 30 mg via INTRAVENOUS
  Filled 2017-05-28: qty 1

## 2017-05-28 MED ORDER — FAMOTIDINE IN NACL 20-0.9 MG/50ML-% IV SOLN
20.0000 mg | Freq: Two times a day (BID) | INTRAVENOUS | Status: DC
  Administered 2017-05-29: 20 mg via INTRAVENOUS
  Filled 2017-05-28 (×4): qty 50

## 2017-05-28 MED ORDER — ADULT MULTIVITAMIN W/MINERALS CH
1.0000 | ORAL_TABLET | Freq: Every day | ORAL | Status: DC
  Administered 2017-05-29 – 2017-05-30 (×2): 1 via ORAL
  Filled 2017-05-28 (×2): qty 1

## 2017-05-28 MED ORDER — DEXTROSE-NACL 5-0.45 % IV SOLN
INTRAVENOUS | Status: DC
  Administered 2017-05-29 – 2017-05-30 (×3): via INTRAVENOUS

## 2017-05-28 MED ORDER — HYDROXYUREA 500 MG PO CAPS: 1500.0000 mg | ORAL_CAPSULE | Freq: Every day | ORAL | Status: DC

## 2017-05-28 MED ORDER — METOPROLOL TARTRATE 25 MG PO TABS: 25.0000 mg | ORAL_TABLET | Freq: Two times a day (BID) | ORAL | Status: DC

## 2017-05-28 MED ORDER — BIOTIN W/ VITAMINS C & E 1250-7.5-7.5 MCG-MG-UNT PO CHEW: CHEWABLE_TABLET | Freq: Every day | ORAL | Status: DC

## 2017-05-28 MED ORDER — SODIUM CHLORIDE 0.9 % IV SOLN
25.0000 mg | INTRAVENOUS | Status: DC | PRN
  Filled 2017-05-28: qty 0.5

## 2017-05-28 MED ORDER — DIPHENHYDRAMINE HCL 25 MG PO CAPS: 25.0000 mg | ORAL_CAPSULE | ORAL | Status: DC | PRN

## 2017-05-28 MED ORDER — SENNOSIDES-DOCUSATE SODIUM 8.6-50 MG PO TABS
1.0000 | ORAL_TABLET | Freq: Two times a day (BID) | ORAL | Status: DC
  Filled 2017-05-28 (×3): qty 1

## 2017-05-28 MED ORDER — DEXTROSE-NACL 5-0.45 % IV SOLN
INTRAVENOUS | Status: DC
  Administered 2017-05-28 (×2): via INTRAVENOUS

## 2017-05-28 MED ORDER — TAMSULOSIN HCL 0.4 MG PO CAPS
0.4000 mg | ORAL_CAPSULE | Freq: Every day | ORAL | Status: DC
Start: 2017-05-29 — End: 2017-05-30
  Administered 2017-05-29 – 2017-05-30 (×2): 0.4 mg via ORAL
  Filled 2017-05-28 (×2): qty 1

## 2017-05-28 MED ORDER — DEFERASIROX 360 MG PO TABS: 1080.0000 mg | ORAL_TABLET | Freq: Every day | ORAL | Status: DC

## 2017-05-28 MED ORDER — METOPROLOL SUCCINATE ER 25 MG PO TB24
12.5000 mg | ORAL_TABLET | Freq: Every day | ORAL | Status: DC
  Administered 2017-05-29 – 2017-05-30 (×2): 12.5 mg via ORAL
  Filled 2017-05-28 (×2): qty 1

## 2017-05-28 MED ORDER — FAMOTIDINE 20 MG PO TABS
20.0000 mg | ORAL_TABLET | Freq: Two times a day (BID) | ORAL | Status: DC
  Administered 2017-05-29 – 2017-05-30 (×2): 20 mg via ORAL
  Filled 2017-05-28 (×2): qty 1

## 2017-05-28 MED ORDER — FOLIC ACID 1 MG PO TABS
1.0000 mg | ORAL_TABLET | Freq: Every day | ORAL | Status: DC
  Administered 2017-05-29 – 2017-05-30 (×2): 1 mg via ORAL
  Filled 2017-05-28 (×2): qty 1

## 2017-05-28 MED ORDER — SODIUM CHLORIDE 0.9 % IV SOLN
Freq: Once | INTRAVENOUS | Status: AC
Start: 2017-05-29 — End: 2017-05-29
  Administered 2017-05-29: 20 mL via INTRAVENOUS

## 2017-05-28 MED ORDER — PROMETHAZINE HCL 25 MG PO TABS: 12.5000 mg | ORAL_TABLET | ORAL | Status: DC | PRN

## 2017-05-28 MED ORDER — HYDROMORPHONE HCL 4 MG PO TABS: 2.0000 mg | ORAL_TABLET | ORAL | Status: DC | PRN

## 2017-05-28 MED ORDER — PROMETHAZINE HCL 25 MG RE SUPP: 12.5000 mg | RECTAL | Status: DC | PRN

## 2017-05-28 NOTE — ED Notes (Signed)
ED Provider at bedside. Pt on auto VS q30. Pt given crackers and water.

## 2017-05-28 NOTE — H&P (Deleted)
PCP:   Patient, No Pcp Per   Chief Complaint:    HPI:   Review of Systems:  The patient denies anorexia, fever, weight loss,, vision loss, decreased hearing, hoarseness, chest pain, syncope, dyspnea on exertion, peripheral edema, balance deficits, hemoptysis, abdominal pain, melena, hematochezia, severe indigestion/heartburn, hematuria, incontinence, genital sores, muscle weakness, suspicious skin lesions, transient blindness, difficulty walking, depression, unusual weight change, abnormal bleeding, enlarged lymph nodes, angioedema, and breast masses.  Past Medical History: Past Medical History:  Diagnosis Date  . Cardiomegaly   . Chronic pain   . CVA (cerebral infarction) 02/2002  . Hypertension   . Liver disease    ?iron  . PCOS (polycystic ovarian syndrome)   . Renal stone   . Sickle cell anemia (HCC)    Past Surgical History:  Procedure Laterality Date  . CHOLECYSTECTOMY    . LIVER BIOPSY  2007  . PORTACATH PLACEMENT      Medications: Prior to Admission medications   Medication Sig Start Date End Date Taking? Authorizing Provider  folic acid (FOLVITE) 1 MG tablet Take 1 mg by mouth daily. 11/21/15  Yes [provider]  hydroxyurea (HYDREA) 500 MG capsule Take 1,500-2,000 mg by mouth daily. May take with food to minimize GI side effects. Alternates taking 3 and 4 tablets each day.   Yes [provider]  magnesium oxide (MAG-OX) 400 MG tablet Take 400 mg by mouth 2 (two) times daily. 12/20/16  Yes [provider]  metoprolol tartrate (LOPRESSOR) 25 MG tablet Take 25 mg by mouth 2 (two) times daily.   Yes [provider]  Biotin w/ Vitamins C & E (HAIR SKIN & NAILS GUMMIES PO) Take 2 each by mouth daily.    [provider]  ciprofloxacin (CIPRO) 500 MG tablet Take 1 tablet (500 mg total) by mouth every 12 (twelve) hours. 12/27/16   Constant, Peggy, MD  Deferasirox (JADENU) 360 MG TABS Take 1,080 mg by mouth daily.    [provider]  HYDROmorphone (DILAUDID) 2 MG tablet Take 1 tablet (2 mg total) by mouth every 4 (four) hours as needed for severe pain. 12/27/16   Constant, Peggy, MD  ibuprofen (ADVIL,MOTRIN) 200 MG tablet Take 3 tablets (600 mg total) by mouth every 6 (six) hours as needed for mild pain or moderate pain. 12/27/16   Constant, Peggy, MD  metoprolol succinate (TOPROL XL) 25 MG 24 hr tablet Take 0.5 tablets (12.5 mg total) by mouth daily. 11/29/15   Quentin AngstJegede, Olugbemiga E, MD  Multiple Vitamin (MULTIVITAMIN WITH MINERALS) TABS tablet Take 1 tablet by mouth daily.    [provider]  ondansetron (ZOFRAN) 4 MG tablet Take 1 tablet (4 mg total) by mouth every 6 (six) hours as needed for nausea. 12/27/16   Constant, Peggy, MD  tamsulosin (FLOMAX) 0.4 MG CAPS capsule Take 1 capsule (0.4 mg total) by mouth daily. 12/28/16   Constant, Gigi GinPeggy, MD    Allergies:   Allergies  Allergen Reactions  . Percocet [Oxycodone-Acetaminophen] Swelling    Swelling of the lip.    Social History:  reports that she has quit smoking. she has never used smokeless tobacco. She reports that she drinks alcohol. She reports that she does not use drugs.  Family History: No family history on file.  Physical Exam: Vitals:   05/28/17 1801 05/28/17 1830 05/28/17 1845 05/28/17 1900  BP:  108/65  (!) 116/58  Pulse: (!) 107 (!) 101 (!) 102 (!) 102  Resp: (!) 24 (!) 24 20 (!)  23  Temp:      TempSrc:      SpO2: 95% 92% 93% 94%  Weight:      Height:        General:  Alert and oriented times three, well developed and nourished, no acute distress Eyes: PERRLA, pink conjunctiva, no scleral icterus ENT: Moist oral mucosa, neck supple, no thyromegaly Lungs: clear to ascultation, no wheeze, no crackles, no use of accessory muscles Cardiovascular: regular rate and rhythm, no regurgitation, no gallops, no murmurs. No carotid bruits, no JVD Abdomen: soft, positive BS, non-tender, non-distended, no organomegaly, not an  acute abdomen GU: not examined Neuro: CN II - XII grossly intact, sensation intact Musculoskeletal: strength 5/5 all extremities, no clubbing, cyanosis or edema Skin: no rash, no subcutaneous crepitation, no decubitus Psych: appropriate patient   Labs on Admission:  Recent Labs    05/28/17 1640  NA 130*  K 4.9  CL 107  CO2 15*  GLUCOSE 109*  BUN 22*  CREATININE 1.14*  CALCIUM 10.8*   Recent Labs    05/28/17 1640  AST 187*  ALT 119*  ALKPHOS 146*  BILITOT 9.7*  PROT 9.3*  ALBUMIN 3.4*   No results for input(s): LIPASE, AMYLASE in the last 72 hours. Recent Labs    05/28/17 1640  WBC 25.2*  NEUTROABS 17.4*  HGB 5.6*  HCT 15.1*  MCV 100.7*  PLT 235   No results for input(s): CKTOTAL, CKMB, CKMBINDEX, TROPONINI in the last 72 hours. Invalid input(s): POCBNP No results for input(s): DDIMER in the last 72 hours. No results for input(s): HGBA1C in the last 72 hours. No results for input(s): CHOL, HDL, LDLCALC, TRIG, CHOLHDL, LDLDIRECT in the last 72 hours. No results for input(s): TSH, T4TOTAL, T3FREE, THYROIDAB in the last 72 hours.  Invalid input(s): FREET3 No results for input(s): VITAMINB12, FOLATE, FERRITIN, TIBC, IRON, RETICCTPCT in the last 72 hours.  Micro Results: No results found for this or any previous visit (from the past 240 hour(s)).   Radiological Exams on Admission: Dg Chest 2 View  Result Date: 05/28/2017 CLINICAL DATA:  Cough and congestion with abdominal pain EXAM: CHEST - 2 VIEW COMPARISON:  Chest radiograph Aug 01, 2016 and chest CT Aug 01, 2016. FINDINGS: Power port catheter tip is in the superior vena cava. No pneumothorax. No edema or consolidation. Heart size and pulmonary vascularity are normal. No adenopathy. No bone lesions. IMPRESSION: No edema or consolidation. Port-A-Cath tip in superior vena cava. No pneumothorax. Electronically Signed   By: Bretta Bang III M.D.   On: 05/28/2017 16:52    Assessment/Plan Present on  Admission: . Gastroenteritis -admit to medsurg -GI [facility] -IVF hydration -  . Dehydration/ AKI (acute kidney injury) (HCC) -see above  . Hyponatremia -  . Sickle cell anemia (HCC) -baseline hemoglobin ~7, transfuse 1 unit PRBC -retic count, LDH, anemia panel -resume home meds -pain meds PRN  HTN -stable, resume home meds  Transaminitis -chronic, at baseline  Leukocytosis -chronic at baseline  Sailor Haughn 05/28/2017, 7:53 PM

## 2017-05-28 NOTE — ED Triage Notes (Signed)
N/V/D since Thursday.

## 2017-05-28 NOTE — ED Notes (Signed)
No changes, tolerated meal. Watching TV. Alert, NAD, calm, interactive, resps e/u, speaking in clear complete sentences, no dyspnea noted, skin W&D, VSS, (denies sx or complaints, including: pain, sob, nausea, dizziness or visual changes). [facility]

## 2017-05-28 NOTE — H&P (Signed)
History and Physical    Pamela Hudson ZOX:[id number] DOB: March 17, 1985 DOA: 05/28/2017  PCP: Patient, No Pcp Per Patient coming from: Home  Chief Complaint: Fatigue  HPI: Pamela Hudson is a 32 y.o. female with medical history significant of sickle cell, essential hypertension, CVA in 2003 came to the hospital with complains of feeling fatigue and dizziness.  Patient states 2 days ago she started experiencing extreme fatigue, dizziness and mild nausea while at work.  After coming home she continued to feel this way therefore took the following day off at work.  On Friday she had feeling of nausea with couple of episodes of nonbloody vomiting and 2-3 episodes of loose stool.  She had some chills at home but no fever.  She returned to work again today and started feeling fatigued, dizzy, nausea with couple of episodes of nonbloody vomiting and therefore came to the ER for evaluation.  Patient denies any abdominal pain, chest pain, shortness of breath and other complaints.  She denies any cough, dysuria, hematuria. She thinks that 1 of her coworker has gastroenteritis as well. States her menstrual cycle started today.  In the ER she was noted to have hemoglobin of 5.6, WBC 25.2, mild renal insufficiency with creatinine of 1.14 with baseline of 0.8.  She also had mild transaminitis.  Her reticulocyte count was elevated at greater than 23.  She was transferred from [facility] for admission for further care.  She admits of medication compliance and denies any recent illness.  Last time she required blood transfusion was about 5-6 months ago.   Review of Systems: As per HPI otherwise 10 point review of systems negative.  Review of Systems Otherwise negative except as per HPI, including: General: Denies fever,night sweats or unintended weight loss. Resp: Denies cough, wheezing, shortness of breath. Cardiac: Denies chest pain, palpitations, orthopnea, paroxysmal nocturnal dyspnea. GI: Denies  abdominal pain,  constipation GU: Denies dysuria, frequency, hesitancy or incontinence MS: Denies muscle aches, joint pain or swelling Neuro: Denies headache, neurologic deficits (focal weakness, numbness, tingling), abnormal gait Psych: Denies anxiety, depression, SI/HI/AVH Skin: Denies new rashes or lesions ID: Denies  exotic exposures, travel  Past Medical History:  Diagnosis Date  . Cardiomegaly   . Chronic pain   . CVA (cerebral infarction) 02/2002  . Hypertension   . Liver disease    ?iron  . PCOS (polycystic ovarian syndrome)   . Renal stone   . Sickle cell anemia (HCC)     Past Surgical History:  Procedure Laterality Date  . CHOLECYSTECTOMY    . LIVER BIOPSY  2007  . PORTACATH PLACEMENT       reports that she has quit smoking. she has never used smokeless tobacco. She reports that she drinks alcohol. She reports that she does not use drugs.  Allergies  Allergen Reactions  . Percocet [Oxycodone-Acetaminophen] Swelling    Swelling of the lip.   Family history of essential hypertension on the father's side  Prior to Admission medications   Medication Sig Start Date End Date Taking? Authorizing Provider  folic acid (FOLVITE) 1 MG tablet Take 1 mg by mouth daily. 11/21/15  Yes [provider]  hydroxyurea (HYDREA) 500 MG capsule Take 1,500-2,000 mg by mouth daily. May take with food to minimize GI side effects. Alternates taking 3 and 4 tablets each day.   Yes [provider]  magnesium oxide (MAG-OX) 400 MG tablet Take 400 mg by mouth 2 (two) times daily. 12/20/16  Yes [provider]  metoprolol tartrate (LOPRESSOR) 25 MG tablet Take 25 mg by mouth 2 (two) times daily.   Yes [provider]  Biotin w/ Vitamins C & E (HAIR SKIN & NAILS GUMMIES PO) Take 2 each by mouth daily.    [provider]  ciprofloxacin (CIPRO) 500 MG tablet Take 1 tablet (500 mg total) by mouth every 12 (twelve) hours. 12/27/16   Constant, Peggy, MD    Deferasirox (JADENU) 360 MG TABS Take 1,080 mg by mouth daily.    [provider]  HYDROmorphone (DILAUDID) 2 MG tablet Take 1 tablet (2 mg total) by mouth every 4 (four) hours as needed for severe pain. 12/27/16   Constant, Peggy, MD  ibuprofen (ADVIL,MOTRIN) 200 MG tablet Take 3 tablets (600 mg total) by mouth every 6 (six) hours as needed for mild pain or moderate pain. 12/27/16   Constant, Peggy, MD  metoprolol succinate (TOPROL XL) 25 MG 24 hr tablet Take 0.5 tablets (12.5 mg total) by mouth daily. 11/29/15   Quentin AngstJegede, Olugbemiga E, MD  Multiple Vitamin (MULTIVITAMIN WITH MINERALS) TABS tablet Take 1 tablet by mouth daily.    [provider]  ondansetron (ZOFRAN) 4 MG tablet Take 1 tablet (4 mg total) by mouth every 6 (six) hours as needed for nausea. 12/27/16   Constant, Peggy, MD  tamsulosin (FLOMAX) 0.4 MG CAPS capsule Take 1 capsule (0.4 mg total) by mouth daily. 12/28/16   Constant, Gigi GinPeggy, MD    Physical Exam: Vitals:   05/28/17 2137 05/28/17 2138 05/28/17 2148 05/28/17 2238  BP: (!) 113/59   125/66  Pulse:  100  97  Resp:    16  Temp:   98.7 F (37.1 C) 98.8 F (37.1 C)  TempSrc:   Oral Oral  SpO2:  90%  97%  Weight:      Height:          Constitutional: NAD, calm, comfortable Vitals:   05/28/17 2137 05/28/17 2138 05/28/17 2148 05/28/17 2238  BP: (!) 113/59   125/66  Pulse:  100  97  Resp:    16  Temp:   98.7 F (37.1 C) 98.8 F (37.1 C)  TempSrc:   Oral Oral  SpO2:  90%  97%  Weight:      Height:       Eyes: PERRL, lids and conjunctivae normal ENMT: Mucous membranes are DRY Posterior pharynx clear of any exudate or lesions.Normal dentition.  Neck: normal, supple, no masses, no thyromegaly Respiratory: clear to auscultation bilaterally, no wheezing, no crackles. Normal respiratory effort. No accessory muscle use.  Cardiovascular: Regular rate and rhythm, no murmurs / rubs / gallops. No extremity edema. 2+ pedal pulses. No carotid bruits.   Abdomen: no tenderness, no masses palpated. No hepatosplenomegaly. Bowel sounds positive.  Musculoskeletal: no clubbing / cyanosis. No joint deformity upper and lower extremities. Good ROM, no contractures. Normal muscle tone.  Skin: no rashes, lesions, ulcers. No induration Neurologic: CN 2-12 grossly intact. Sensation intact, DTR normal. Strength 5/5 in all 4.  Psychiatric: Normal judgment and insight. Alert and oriented x 3. Normal mood.     Labs on Admission: I have personally reviewed following labs and imaging studies  CBC: Recent Labs  Lab 05/28/17 1640  WBC 25.2*  NEUTROABS 17.4*  HGB 5.6*  HCT 15.1*  MCV 100.7*  PLT 235   Basic Metabolic Panel: Recent Labs  Lab 05/28/17 1640  NA 130*  K 4.9  CL 107  CO2 15*  GLUCOSE 109*  BUN  22*  CREATININE 1.14*  CALCIUM 10.8*   GFR: Estimated Creatinine Clearance: 66.4 mL/min (A) (by C-G formula based on SCr of 1.14 mg/dL (H)). Liver Function Tests: Recent Labs  Lab 05/28/17 1640  AST 187*  ALT 119*  ALKPHOS 146*  BILITOT 9.7*  PROT 9.3*  ALBUMIN 3.4*   No results for input(s): LIPASE, AMYLASE in the last 168 hours. No results for input(s): AMMONIA in the last 168 hours. Coagulation Profile: No results for input(s): INR, PROTIME in the last 168 hours. Cardiac Enzymes: No results for input(s): CKTOTAL, CKMB, CKMBINDEX, TROPONINI in the last 168 hours. BNP (last 3 results) No results for input(s): PROBNP in the last 8760 hours. HbA1C: No results for input(s): HGBA1C in the last 72 hours. CBG: No results for input(s): GLUCAP in the last 168 hours. Lipid Profile: No results for input(s): CHOL, HDL, LDLCALC, TRIG, CHOLHDL, LDLDIRECT in the last 72 hours. Thyroid Function Tests: No results for input(s): TSH, T4TOTAL, FREET4, T3FREE, THYROIDAB in the last 72 hours. Anemia Panel: Recent Labs    05/28/17 1640  RETICCTPCT >23.0*   Urine analysis:    Component Value Date/Time   COLORURINE AMBER (A) 05/28/2017  1521   APPEARANCEUR CLEAR 05/28/2017 1521   LABSPEC 1.020 05/28/2017 1521   PHURINE 5.5 05/28/2017 1521   GLUCOSEU NEGATIVE 05/28/2017 1521   HGBUR LARGE (A) 05/28/2017 1521   BILIRUBINUR MODERATE (A) 05/28/2017 1521   KETONESUR NEGATIVE 05/28/2017 1521   PROTEINUR 100 (A) 05/28/2017 1521   UROBILINOGEN 0.2 05/15/2013 2130   NITRITE NEGATIVE 05/28/2017 1521   LEUKOCYTESUR TRACE (A) 05/28/2017 1521   Sepsis Labs: !!!!!!!!!!!!!!!!!!!!!!!!!!!!!!!!!!!!!!!!!!!! @LABRCNTIP (procalcitonin:4,lacticidven:4) )No results found for this or any previous visit (from the past 240 hour(s)).   Radiological Exams on Admission: Dg Chest 2 View  Result Date: 05/28/2017 CLINICAL DATA:  Cough and congestion with abdominal pain EXAM: CHEST - 2 VIEW COMPARISON:  Chest radiograph Aug 01, 2016 and chest CT Aug 01, 2016. FINDINGS: Power port catheter tip is in the superior vena cava. No pneumothorax. No edema or consolidation. Heart size and pulmonary vascularity are normal. No adenopathy. No bone lesions. IMPRESSION: No edema or consolidation. Port-A-Cath tip in superior vena cava. No pneumothorax. Electronically Signed   By: Bretta Bang III M.D.   On: 05/28/2017 16:52     Assessment/Plan Active Problems:   Sickle cell anemia (HCC)   Hyponatremia   Gastroenteritis   Dehydration   AKI (acute kidney injury) (HCC)   Sickle cell anemia with crisis (HCC)    Symptomatic anemia Acute sickle cell crisis Iron deficiency anemia -Patient admitted for further care and management - Reticulocyte count is elevated greater than 23, will transfuse 2 units of PRBC - Pain control, IV fluids -Antiemetics as needed -Provide supportive care.  Follow hemoglobin closely -Continue home regimen of iron supplements -Continue hydroxyurea, folic acid  Leukocytosis - Reactive versus some infection-gastroenteritis -We will follow closely. -UA appears to be dirty but there is no WBC.  Currently she is menstruating so it  could be related to that.  Hold off on antibiotics  Nausea, nonbloody vomiting and nonbloody diarrhea - Likely gastroenteritis.  Will check GI panel -Denies having watery stool therefore will hold off on C. difficile study.  If necessary we will check her for C. difficile as well - Diet as tolerated.  Antiemetics as needed  Mild to moderate dehydration with mild renal insufficiency -Baseline creatinine 0.8, today is 1.14.  This is prerenal in nature due to reasons described above -  Will give IV fluids, avoid nephrotoxic drugs and monitor creatinine   Mild transaminitis -This is chronic but will keep a close eye on this.  If necessary will get right upper quadrant ultrasound and further workup  History of essential hypertension -On metoprolol at home.  We will continue this    DVT prophylaxis: Early ambulation, SCDs Code Status: Full code Family Communication: Family at bedside Disposition Plan: To be determined Consults called: None Admission status:  MedSurg admission   Ankit Joline Maxcy MD [facility] Pager [phone]  If 7PM-7AM, please contact night-coverage [email or web address] Password [facility]  05/28/2017, 11:53 PM

## 2017-05-28 NOTE — ED Notes (Signed)
Date and time results received: 05/28/17 1718 (use smartphrase ".now" to insert current time)  Test:Hgb Critical Value: 5.6  Name of Provider Notified:Rees  Orders Received? Or Actions Taken?: no orders given

## 2017-05-28 NOTE — ED Provider Notes (Signed)
[facility] Provider Note   CSN: [id number] Arrival date & time: 05/28/17  1450     History   Chief Complaint Chief Complaint  Patient presents with  . Emesis  . Diarrhea    HPI Pamela Hudson is a 32 y.o. female.  The history is provided by the patient. No language interpreter was used.  Emesis   Associated symptoms include diarrhea.  Diarrhea   Associated symptoms include vomiting.    Pamela Hudson is a 32 y.o. female who presents to the Emergency Department complaining of vomiting/diarrhea. He of sickle cell disease and comes in for evaluation of vomiting and diarrhea. Three days ago she again feeling poorly at work with nausea and vomiting times two. Two days ago she developed loose stools times two. She is reported three days of cough. She gets chills at times but denies any fevers. Today she developed some lower abdominal discomfort that she attributes to her menstrual cycle. She has hepatomegaly related to low her sickle cell disease. No additional medical problems. She does have sick contacts at work. She did receive her fluid pneumonia vaccine this year.  Past Medical History:  Diagnosis Date  . Cardiomegaly   . Chronic pain   . CVA (cerebral infarction) 02/2002  . Hypertension   . Liver disease    ?iron  . PCOS (polycystic ovarian syndrome)   . Renal stone   . Sickle cell anemia [facility])     Patient Active Problem List   Diagnosis Date Noted  . Gastroenteritis 05/28/2017  . Dehydration 05/28/2017  . AKI (acute kidney injury) (HCC) 05/28/2017  . Sickle cell anemia with crisis (HCC) 05/28/2017  . Hemorrhagic ovarian cyst 12/26/2016  . Hemorrhagic cyst of ovary 12/26/2016  . Pneumonia 11/26/2015  . Sickle cell anemia (HCC) 11/26/2015  . Elevated bilirubin 11/26/2015  . Serum calcium elevated 11/26/2015  . Hyponatremia 11/26/2015  . Leukocytosis 11/26/2015  . CVA (cerebral infarction) 02/13/2012    Past Surgical History:    Procedure Laterality Date  . CHOLECYSTECTOMY    . LIVER BIOPSY  2007  . PORTACATH PLACEMENT      OB History    No data available       Home Medications    Prior to Admission medications   Medication Sig Start Date End Date Taking? Authorizing Provider  Deferasirox (JADENU) 360 MG TABS Take 1,440 mg by mouth daily.    Yes [provider]  folic acid (FOLVITE) 1 MG tablet Take 1 mg by mouth daily. 11/21/15  Yes [provider]  HYDROmorphone (DILAUDID) 2 MG tablet Take 1 tablet (2 mg total) by mouth every 4 (four) hours as needed for severe pain. 12/27/16  Yes Constant, Peggy, MD  hydroxyurea (HYDREA) 500 MG capsule Take 1,000 mg by mouth daily.    Yes [provider]  ibuprofen (ADVIL,MOTRIN) 200 MG tablet Take 3 tablets (600 mg total) by mouth every 6 (six) hours as needed for mild pain or moderate pain. 12/27/16  Yes Constant, Peggy, MD  metoprolol succinate (TOPROL XL) 25 MG 24 hr tablet Take 0.5 tablets (12.5 mg total) by mouth daily. 11/29/15  Yes Quentin Angst, MD    Family History No family history on file.  Social History Social History   Tobacco Use  . Smoking status: Former Games developer  . Smokeless tobacco: Never Used  Substance Use Topics  . Alcohol use: Yes    Comment: occasion  . Drug use: No  Allergies   Percocet [oxycodone-acetaminophen]   Review of Systems Review of Systems  Gastrointestinal: Positive for diarrhea and vomiting.  All other systems reviewed and are negative.    Physical Exam Updated Vital Signs BP (!) 112/52 (BP Location: Left Arm)   Pulse 98   Temp 99.4 F (37.4 C) (Oral)   Resp 20   Ht 4\' 11"  (1.499 m)   Wt 79.8 kg (176 lb)   LMP 05/27/2017   SpO2 94%   BMI 35.55 kg/m   Physical Exam  Constitutional: She is oriented to person, place, and time. She appears well-developed and well-nourished.  HENT:  Head: Normocephalic and atraumatic.  Eyes:  Scleral icterus.  Cardiovascular: Normal  rate and regular rhythm.  Systolic ejection murmur  Pulmonary/Chest: Effort normal and breath sounds normal. No respiratory distress.  Abdominal: Soft. There is no rebound and no guarding.  Hepatomegaly. Umbilical hernia that is soft and easily reducible. No significant abdominal tenderness.  Musculoskeletal: She exhibits no edema or tenderness.  Neurological: She is alert and oriented to person, place, and time.  Skin: Skin is warm and dry.  Psychiatric: She has a normal mood and affect. Her behavior is normal.  Nursing note and vitals reviewed.    ED Treatments / Results  Labs (all labs ordered are listed, but only abnormal results are displayed) Labs Reviewed  URINALYSIS, ROUTINE W REFLEX MICROSCOPIC - Abnormal; Notable for the following components:      Result Value   Color, Urine AMBER (*)    Hgb urine dipstick LARGE (*)    Bilirubin Urine MODERATE (*)    Protein, ur 100 (*)    Leukocytes, UA TRACE (*)    All other components within normal limits  URINALYSIS, MICROSCOPIC (REFLEX) - Abnormal; Notable for the following components:   Bacteria, UA MANY (*)    Squamous Epithelial / LPF 0-5 (*)    All other components within normal limits  COMPREHENSIVE METABOLIC PANEL - Abnormal; Notable for the following components:   Sodium 130 (*)    CO2 15 (*)    Glucose, Bld 109 (*)    BUN 22 (*)    Creatinine, Ser 1.14 (*)    Calcium 10.8 (*)    Total Protein 9.3 (*)    Albumin 3.4 (*)    AST 187 (*)    ALT 119 (*)    Alkaline Phosphatase 146 (*)    Total Bilirubin 9.7 (*)    All other components within normal limits  CBC WITH DIFFERENTIAL/PLATELET - Abnormal; Notable for the following components:   WBC 25.2 (*)    RBC 1.50 (*)    Hemoglobin 5.6 (*)    HCT 15.1 (*)    MCV 100.7 (*)    MCH 37.1 (*)    MCHC 36.8 (*)    RDW 18.5 (*)    Neutro Abs 17.4 (*)    Lymphs Abs 4.3 (*)    Monocytes Absolute 3.5 (*)    All other components within normal limits  RETICULOCYTES -  Abnormal; Notable for the following components:   Retic Ct Pct >23.0 (*)    RBC. 1.58 (*)    All other components within normal limits  URINE CULTURE  GASTROINTESTINAL PANEL BY PCR, STOOL (REPLACES STOOL CULTURE)  PREGNANCY, URINE  HIV ANTIBODY (ROUTINE TESTING)  CBC WITH DIFFERENTIAL/PLATELET  TYPE AND SCREEN  PREPARE RBC (CROSSMATCH)    EKG  EKG Interpretation None       Radiology Dg Chest 2 View  Result  Date: 05/28/2017 CLINICAL DATA:  Cough and congestion with abdominal pain EXAM: CHEST - 2 VIEW COMPARISON:  Chest radiograph Aug 01, 2016 and chest CT Aug 01, 2016. FINDINGS: Power port catheter tip is in the superior vena cava. No pneumothorax. No edema or consolidation. Heart size and pulmonary vascularity are normal. No adenopathy. No bone lesions. IMPRESSION: No edema or consolidation. Port-A-Cath tip in superior vena cava. No pneumothorax. Electronically Signed   By: Bretta Bang III M.D.   On: 05/28/2017 16:52    Procedures Procedures (including critical care time)  Medications Ordered in ED Medications  dextrose 5 %-0.45 % sodium chloride infusion ( Intravenous Transfusing/Transfer 05/28/17 2153)  0.9 %  sodium chloride infusion (not administered)  senna-docusate (Senokot-S) tablet 1 tablet (1 tablet Oral Not Given 05/29/17 0131)  polyethylene glycol (MIRALAX / GLYCOLAX) packet 17 g (not administered)  dextrose 5 %-0.45 % sodium chloride infusion ( Intravenous New Bag/Given 05/29/17 0107)  ketorolac (TORADOL) 30 MG/ML injection 30 mg (30 mg Intravenous Not Given 05/29/17 0205)  diphenhydrAMINE (BENADRYL) capsule 25 mg (not administered)    Or  diphenhydrAMINE (BENADRYL) 25 mg in sodium chloride 0.9 % 50 mL IVPB (not administered)  promethazine (PHENERGAN) tablet 12.5-25 mg (not administered)    Or  promethazine (PHENERGAN) suppository 12.5-25 mg (not administered)  famotidine (PEPCID) tablet 20 mg ( Oral See Alternative 05/29/17 0204)    Or  famotidine (PEPCID)  IVPB 20 mg premix (20 mg Intravenous New Bag/Given 05/29/17 0204)  folic acid (FOLVITE) tablet 1 mg (not administered)  Deferasirox TABS 1,080 mg (not administered)  metoprolol succinate (TOPROL-XL) 24 hr tablet 12.5 mg (not administered)  multivitamin with minerals tablet 1 tablet (not administered)  tamsulosin (FLOMAX) capsule 0.4 mg (not administered)  HYDROmorphone (DILAUDID) tablet 2 mg (not administered)     Initial Impression / Assessment and Plan / ED Course  I have reviewed the triage vital signs and the nursing notes.  Pertinent labs & imaging results that were available during my care of the patient were reviewed by me and considered in my medical decision making (see chart for details).     Pt with hx/o sickle cell anemia here for evaluation of N/V/D.  She is nontoxic on exam with no significant abdominal tenderness.  Hepatomegaly is present that patient states is at her baseline.  Labs demonstrate dehydration with low bicarb, elevated BUN and creatinine - providing IVF hydration.  Hemoglobin low at 5.6 - similar on record review in care everywhere compared to prior one month ago.  Given sickle cell disease with dehydration and AKI recommend admission for IVF hydration and monitoring her hemoglobin.  Discussed with patient findings of studies and recommendation for admission for further treatment and she is in agreement with plan.  Hospitalist consulted for admission at [facility].    Final Clinical Impressions(s) / ED Diagnoses   Final diagnoses:  None    ED Discharge Orders    None       Tilden Fossa, MD 05/29/17 [phone]

## 2017-05-28 NOTE — ED Notes (Signed)
[facility] here for transport, no changes.  

## 2017-05-28 NOTE — ED Notes (Signed)
Patient transported to X-ray 

## 2017-05-28 NOTE — ED Notes (Signed)
Pt reports n/v x 2 on Thursday, then vomited again today x 1. States she had 2 watery BMs yesterday and today had one BM that was "loose", not watery. Reports co-workers with similar Sx. States she she has 5/10 crampy lower abd pain. States she also started her menstrual cycle today. Denies nausea at this time.

## 2017-05-28 NOTE — ED Notes (Signed)
Alert, NAD, calm, interactive, resps e/u, speaking in clear complete sentences, no dyspnea noted, skin W&D, VSS, tolerating water and saltines, (denies: pain, sob, nausea, dizziness or visual changes). [facility]

## 2017-05-28 NOTE — ED Notes (Signed)
Dr. Madilyn Hookees made aware of pt's O2 sats 90-94% on room air. No new orders

## 2017-05-29 LAB — CBC WITH DIFFERENTIAL/PLATELET
BASOS ABS: 0.3 10*3/uL — AB (ref 0.0–0.1)
Basophils Relative: 1 %
EOS ABS: 0.3 10*3/uL (ref 0.0–0.7)
Eosinophils Relative: 1 %
HEMATOCRIT: 21.2 % — AB (ref 36.0–46.0)
Hemoglobin: 7.7 g/dL — ABNORMAL LOW (ref 12.0–15.0)
LYMPHS ABS: 9.6 10*3/uL — AB (ref 0.7–4.0)
LYMPHS PCT: 36 %
MCH: 33.8 pg (ref 26.0–34.0)
MCHC: 36.3 g/dL — ABNORMAL HIGH (ref 30.0–36.0)
MCV: 93 fL (ref 78.0–100.0)
MONOS PCT: 14 %
Monocytes Absolute: 3.8 10*3/uL — ABNORMAL HIGH (ref 0.1–1.0)
Neutro Abs: 12.8 10*3/uL — ABNORMAL HIGH (ref 1.7–7.7)
Neutrophils Relative %: 48 %
Platelets: 454 10*3/uL — ABNORMAL HIGH (ref 150–400)
RBC: 2.28 MIL/uL — AB (ref 3.87–5.11)
RDW: 21.1 % — AB (ref 11.5–15.5)
WBC: 26.8 10*3/uL — AB (ref 4.0–10.5)

## 2017-05-29 LAB — URINE CULTURE: Culture: <10000 — AB

## 2017-05-29 LAB — PREPARE RBC (CROSSMATCH)

## 2017-05-29 NOTE — Progress Notes (Signed)
MEDICATION RELATED CONSULT NOTE - INITIAL   Pharmacy Consult for Hydroxyurea Indication: SS  Allergies  Allergen Reactions  . Percocet [Oxycodone-Acetaminophen] Swelling    Swelling of the lip.    Patient Measurements: Height: 4\' 11"  (149.9 cm) Weight: 176 lb (79.8 kg) IBW/kg (Calculated) : 43.2 Adjusted Body Weight:   Vital Signs: Temp: 98.8 F (37.1 C) (03/16 2238) Temp Source: Oral (03/16 2238) BP: 125/66 (03/16 2238) Pulse Rate: 97 (03/16 2238) Intake/Output from previous day: 03/16 0701 - 03/17 0700 In: 1000 [I.V.:1000] Out: -  Intake/Output from this shift: Total I/O In: 1000 [I.V.:1000] Out: -   Labs: Recent Labs    05/28/17 1640  WBC 25.2*  HGB 5.6*  HCT 15.1*  PLT 235  CREATININE 1.14*  ALBUMIN 3.4*  PROT 9.3*  AST 187*  ALT 119*  ALKPHOS 146*  BILITOT 9.7*   Estimated Creatinine Clearance: 66.4 mL/min (A) (by C-G formula based on SCr of 1.14 mg/dL (H)).   Microbiology: No results found for this or any previous visit (from the past 720 hour(s)).  Medical History: Past Medical History:  Diagnosis Date  . Cardiomegaly   . Chronic pain   . CVA (cerebral infarction) 02/2002  . Hypertension   . Liver disease    ?iron  . PCOS (polycystic ovarian syndrome)   . Renal stone   . Sickle cell anemia (HCC)     Medications:  Medications Prior to Admission  Medication Sig Dispense Refill Last Dose  . Deferasirox (JADENU) 360 MG TABS Take 1,440 mg by mouth daily.    05/28/2017 at Unknown time  . folic acid (FOLVITE) 1 MG tablet Take 1 mg by mouth daily.   05/28/2017 at Unknown time  . HYDROmorphone (DILAUDID) 2 MG tablet Take 1 tablet (2 mg total) by mouth every 4 (four) hours as needed for severe pain. 30 tablet 0 Past Month at Unknown time  . hydroxyurea (HYDREA) 500 MG capsule Take 1,000 mg by mouth daily.    05/28/2017 at Unknown time  . ibuprofen (ADVIL,MOTRIN) 200 MG tablet Take 3 tablets (600 mg total) by mouth every 6 (six) hours as needed for  mild pain or moderate pain. 30 tablet 3 unk  . metoprolol succinate (TOPROL XL) 25 MG 24 hr tablet Take 0.5 tablets (12.5 mg total) by mouth daily. 30 tablet 1 05/28/2017 at 1030   Scheduled:  Marland Kitchen. Deferasirox  1,080 mg Oral Daily  . famotidine  20 mg Oral Q12H  . folic acid  1 mg Oral Daily  . ketorolac  30 mg Intravenous Q6H  . metoprolol succinate  12.5 mg Oral Daily  . multivitamin with minerals  1 tablet Oral Daily  . senna-docusate  1 tablet Oral BID  . tamsulosin  0.4 mg Oral Daily    Assessment: Patient with Hgb < 6 on admit.  Hydroxyurea (Hydrea) hold criteria:  ANC < 2  Pltc < 80K in sickle-cell patients; < 100K in other patients  Hgb <= 6 in sickle-cell patients; < 8 in other patients  Reticulocytes < 80K when Hgb < 9   Goal of Therapy:  Safe and effective use of hydroxyurea  Plan:  D/c hydroxyurea due to labs at this time.  Aleene DavidsonGrimsley Jr, Jewell Haught Crowford 05/29/2017,1:04 AM

## 2017-05-29 NOTE — Progress Notes (Signed)
PHARMACY NOTE -  ANTIBIOTIC RENAL DOSE ADJUSTMENT   Request received for Pharmacy to assist with antibiotic renal dose adjustment.  Patient has been initiated on Ceftriaxone 2gm iv q24hr for UTI. SCr 1.14, estimated CrCl 66 ml/min Current dosage is appropriate and need for further dosage adjustment appears unlikely at present. Will sign off at this time.  Please reconsult if a change in clinical status warrants re-evaluation of dosage.

## 2017-05-29 NOTE — Progress Notes (Signed)
Patient ID: Pamela Hudson, female   DOB: 1985/09/30, 32 y.o.   MRN: [id number]  PROGRESS NOTE    Pamela Hudson  ZDG:[id number] DOB: 12-24-1985 DOA: 05/28/2017  PCP: Patient, No Pcp Per   Brief Narrative:  32 year old female with medical history significant for sickle cell, essential hypertension, CVA in 2003 who presented to ED with fatigue and dizziness as well as nausea and non-bloody vomiting, loose stools.   In the ER she was noted to have hemoglobin of 5.6, WBC 25.2, mild renal insufficiency with creatinine of 1.14 with baseline of 0.8.  She also had mild transaminitis.  Her reticulocyte count was elevated at greater than 23.  She was transferred from [facility] to [facility] for further evaluation and treatment.  Assessment & Plan:   Active Problems:   Sickle cell anemia (HCC) - Hgb 5.6 on the admission - Has received 2 U PRBC transfusion this am - Follow up BMP in am    Hyponatremia - Due to dehydration - Continue IV fluids - Follow up BMP in am    Gastroenteritis - Will try regular diet today - Continue anti-emetics PRN    AKI (acute kidney injury) (HCC) - Due to prerenal etiology, GI losses and dehydration - Continue IV fluids - Follow up BMP in am    UTI - Trace leukocytes on UA on admission - Urine cx with no significant growth - Rocephin given 1 time on admission     Transaminitis - Likely from previous transfusions - Will trend LFT's     DVT prophylaxis: SCD's Code Status: full code  Family Communication: no family at bedside Disposition Plan: home in am if tolerates regular diet    Consultants:   None   Procedures:   2 U PRBC transfusion   Antimicrobials:   None    Subjective: No overnight events.  Objective: Vitals:   05/29/17 1400 05/29/17 1431 05/29/17 1702 05/29/17 1813  BP: 121/64 108/61 (!) 116/56 (!) 118/58  Pulse: 96 98 89 87  Resp: 16 16 16 16   Temp: 98 F (36.7 C) 99 F (37.2 C) 97.9 F (36.6 C) 98.7 F (37.1 C)  TempSrc:  Oral Oral Oral Oral  SpO2: 99% 97% 100% 98%  Weight:      Height:        Intake/Output Summary (Last 24 hours) at 05/29/2017 2107 Last data filed at 05/29/2017 1702 Gross per 24 hour  Intake 4664.59 ml  Output -  Net 4664.59 ml   Filed Weights   05/28/17 1500  Weight: 79.8 kg (176 lb)    Examination:  General exam: Appears calm and comfortable  Respiratory system: Clear to auscultation. Respiratory effort normal. Cardiovascular system: S1 & S2 heard, RRR.  Gastrointestinal system: Abdomen is nondistended, soft and nontender. No organomegaly or masses felt. Normal bowel sounds heard. Central nervous system: Alert and oriented. No focal neurological deficits. Extremities: Symmetric 5 x 5 power. Skin: No rashes, lesions or ulcers Psychiatry: Judgement and insight appear normal. Mood & affect appropriate.   Data Reviewed: I have personally reviewed following labs and imaging studies  CBC: Recent Labs  Lab 05/28/17 1640  WBC 25.2*  NEUTROABS 17.4*  HGB 5.6*  HCT 15.1*  MCV 100.7*  PLT 235   Basic Metabolic Panel: Recent Labs  Lab 05/28/17 1640  NA 130*  K 4.9  CL 107  CO2 15*  GLUCOSE 109*  BUN 22*  CREATININE 1.14*  CALCIUM 10.8*   GFR: Estimated Creatinine Clearance: 66.4 mL/min (A) (  by C-G formula based on SCr of 1.14 mg/dL (H)). Liver Function Tests: Recent Labs  Lab 05/28/17 1640  AST 187*  ALT 119*  ALKPHOS 146*  BILITOT 9.7*  PROT 9.3*  ALBUMIN 3.4*   No results for input(s): LIPASE, AMYLASE in the last 168 hours. No results for input(s): AMMONIA in the last 168 hours. Coagulation Profile: No results for input(s): INR, PROTIME in the last 168 hours. Cardiac Enzymes: No results for input(s): CKTOTAL, CKMB, CKMBINDEX, TROPONINI in the last 168 hours. BNP (last 3 results) No results for input(s): PROBNP in the last 8760 hours. HbA1C: No results for input(s): HGBA1C in the last 72 hours. CBG: No results for input(s): GLUCAP in the last 168  hours. Lipid Profile: No results for input(s): CHOL, HDL, LDLCALC, TRIG, CHOLHDL, LDLDIRECT in the last 72 hours. Thyroid Function Tests: No results for input(s): TSH, T4TOTAL, FREET4, T3FREE, THYROIDAB in the last 72 hours. Anemia Panel: Recent Labs    05/28/17 1640  RETICCTPCT >23.0*   Urine analysis:    Component Value Date/Time   COLORURINE AMBER (A) 05/28/2017 1521   APPEARANCEUR CLEAR 05/28/2017 1521   LABSPEC 1.020 05/28/2017 1521   PHURINE 5.5 05/28/2017 1521   GLUCOSEU NEGATIVE 05/28/2017 1521   HGBUR LARGE (A) 05/28/2017 1521   BILIRUBINUR MODERATE (A) 05/28/2017 1521   KETONESUR NEGATIVE 05/28/2017 1521   PROTEINUR 100 (A) 05/28/2017 1521   UROBILINOGEN 0.2 05/15/2013 2130   NITRITE NEGATIVE 05/28/2017 1521   LEUKOCYTESUR TRACE (A) 05/28/2017 1521   Sepsis Labs: @LABRCNTIP (procalcitonin:4,lacticidven:4)   Recent Results (from the past 240 hour(s))  Urine culture     Status: Abnormal   Collection Time: 05/28/17  3:21 PM  Result Value Ref Range Status   Specimen Description   Final    URINE, RANDOM Performed at [facility], [address] [place], [place] [address]    Special Requests   Final    NONE Performed at [facility], [address] [place], [place] [address]    Culture (A)  Final    <10,000 COLONIES/mL INSIGNIFICANT GROWTH Performed at [facility], [address], [place], [place] [address]    Report Status 05/29/2017 FINAL  Final      Radiology Studies: Dg Chest 2 View  Result Date: 05/28/2017 CLINICAL DATA:  Cough and congestion with abdominal pain EXAM: CHEST - 2 VIEW COMPARISON:  Chest radiograph Aug 01, 2016 and chest CT Aug 01, 2016. FINDINGS: Power port catheter tip is in the superior vena cava. No pneumothorax. No edema or consolidation. Heart size and pulmonary vascularity are normal. No adenopathy. No bone lesions. IMPRESSION: No edema or consolidation. Port-A-Cath tip in superior vena cava. No  pneumothorax. Electronically Signed   By: Bretta Bang III M.D.   On: 05/28/2017 16:52        Scheduled Meds: . Deferasirox  1,080 mg Oral Daily  . famotidine  20 mg Oral Q12H  . folic acid  1 mg Oral Daily  . ketorolac  30 mg Intravenous Q6H  . metoprolol succinate  12.5 mg Oral Daily  . multivitamin with minerals  1 tablet Oral Daily  . senna-docusate  1 tablet Oral BID  . tamsulosin  0.4 mg Oral Daily   Continuous Infusions: . dextrose 5 % and 0.45% NaCl 125 mL/hr at 05/29/17 1008  . diphenhydrAMINE    . famotidine (PEPCID) IV Stopped (05/29/17 0234)     LOS: 1 day    Time spent:  25 minutes  Greater than 50% of the time spent on counseling and coordinating the care.   Manson PasseyAlma Jimel Myler, MD [facility] Pager [phone]  If 7PM-7AM, please contact night-coverage [email or web address] Password [email or web address] 05/29/2017, 9:07 PM

## 2017-05-30 DIAGNOSIS — K529 Noninfective gastroenteritis and colitis, unspecified: Principal | ICD-10-CM

## 2017-05-30 DIAGNOSIS — D5700 Hb-SS disease with crisis, unspecified: Secondary | ICD-10-CM

## 2017-05-30 DIAGNOSIS — N179 Acute kidney failure, unspecified: Secondary | ICD-10-CM

## 2017-05-30 DIAGNOSIS — E871 Hypo-osmolality and hyponatremia: Secondary | ICD-10-CM

## 2017-05-30 DIAGNOSIS — E860 Dehydration: Secondary | ICD-10-CM

## 2017-05-30 LAB — TYPE AND SCREEN
ABO/RH(D): B POS
Antibody Screen: NEGATIVE
DONOR AG TYPE: NEGATIVE
Donor AG Type: NEGATIVE
UNIT DIVISION: 0
Unit division: 0

## 2017-05-30 LAB — PROCALCITONIN: PROCALCITONIN: 1.02 ng/mL

## 2017-05-30 LAB — COMPREHENSIVE METABOLIC PANEL
ALT: 91 U/L — ABNORMAL HIGH (ref 14–54)
ANION GAP: 6 (ref 5–15)
AST: 115 U/L — ABNORMAL HIGH (ref 15–41)
Albumin: 2.8 g/dL — ABNORMAL LOW (ref 3.5–5.0)
Alkaline Phosphatase: 147 U/L — ABNORMAL HIGH (ref 38–126)
BILIRUBIN TOTAL: 6.3 mg/dL — AB (ref 0.3–1.2)
BUN: 16 mg/dL (ref 6–20)
CO2: 19 mmol/L — ABNORMAL LOW (ref 22–32)
Calcium: 10.5 mg/dL — ABNORMAL HIGH (ref 8.9–10.3)
Chloride: 108 mmol/L (ref 101–111)
Creatinine, Ser: 0.7 mg/dL (ref 0.44–1.00)
GFR calc Af Amer: >60 mL/min (ref >60)
Glucose, Bld: 100 mg/dL — ABNORMAL HIGH (ref 65–99)
Potassium: 5 mmol/L (ref 3.5–5.1)
Sodium: 133 mmol/L — ABNORMAL LOW (ref 135–145)
TOTAL PROTEIN: 8.2 g/dL — AB (ref 6.5–8.1)

## 2017-05-30 LAB — CBC
HEMATOCRIT: 21.8 % — AB (ref 36.0–46.0)
Hemoglobin: 7.8 g/dL — ABNORMAL LOW (ref 12.0–15.0)
MCH: 34.5 pg — ABNORMAL HIGH (ref 26.0–34.0)
MCHC: 35.8 g/dL (ref 30.0–36.0)
MCV: 96.5 fL (ref 78.0–100.0)
PLATELETS: 376 10*3/uL (ref 150–400)
RBC: 2.26 MIL/uL — ABNORMAL LOW (ref 3.87–5.11)
RDW: 21.4 % — AB (ref 11.5–15.5)
WBC: 24 10*3/uL — AB (ref 4.0–10.5)

## 2017-05-30 LAB — GASTROINTESTINAL PANEL BY PCR, STOOL (REPLACES STOOL CULTURE)

## 2017-05-30 LAB — BPAM RBC
BLOOD PRODUCT EXPIRATION DATE: 201903312359
BLOOD PRODUCT EXPIRATION DATE: 201903312359
ISSUE DATE / TIME: 201903170332
ISSUE DATE / TIME: 201903171408
UNIT TYPE AND RH: 5100
Unit Type and Rh: 5100

## 2017-05-30 NOTE — Progress Notes (Signed)
Patient leaving against medical advice, MD just left room after morning rounds. Pt is aware that physician felt she was not medically ready to discharge however pt needs to leave for a funeral.  Per pt she will contact her sickle cell MD for follow up.

## 2017-05-30 NOTE — Discharge Summary (Signed)
Discharge Summary  Pamela Hudson ZOX:[id number] DOB: 09/25/1985  PCP: Patient, No Pcp Per  Admit date: 05/28/2017 Discharge date: 05/30/2017  Time spent: 25 minutes  Recommendations for Outpatient Follow-up:  1. Patient left AGAINST MEDICAL ADVICE  Discharge Diagnoses:  Active Hospital Problems   Diagnosis Date Noted  . Gastroenteritis 05/28/2017  . Dehydration 05/28/2017  . AKI (acute kidney injury) (HCC) 05/28/2017  . Sickle cell anemia with crisis (HCC) 05/28/2017  . Hyponatremia 11/26/2015  . Sickle cell anemia (HCC) 11/26/2015    Resolved Hospital Problems  No resolved problems to display.      Vitals:   05/30/17 0224 05/30/17 0617  BP: 122/63 124/68  Pulse: 88 96  Resp: 16 16  Temp: 99.9 F (37.7 C) 99 F (37.2 C)  SpO2: 100% 96%    History of present illness:  32 year old female with medical history significant for sickle cell, essential hypertension, CVA in 2003 who presented to ED with fatigue and dizziness as well as nausea and non-bloody vomiting, loose stools.   In the ER she was noted to have hemoglobin of 5.6, WBC 25.2, mild renal insufficiency with creatinine of 1.14 with baseline of 0.8. She also had mild transaminitis. Her reticulocyte count was elevated at greater than 23. She was [facility] to [facility] for further evaluation and treatment.  05/30/2017: Patient seen and examined at her bedside.  Patient is adamant about leaving.  Patient explained that leukocytosis trending up and will need repeat CBC to evaluate for any trends.  Patient reports one stool this morning.  Stool culture is still pending.  Patient threatening to leave AGAINST MEDICAL ADVICE.  Explained to the patient the risks.  She voiced understanding of those risks and stated that she will leave and follow-up with her sickle cell physician.  Hospital Course:  Active Problems:   Sickle cell anemia (HCC)   Hyponatremia   Gastroenteritis   Dehydration   AKI (acute  kidney injury) (HCC)   Sickle cell anemia with crisis (HCC)  Intractable nausea vomiting/diarrhea Stool studies pending Received IV fluid No new abdominal imaging Patient refuses any further investigation Antiemetic as needed  Severe anemia secondary to sickle cell disease - Hgb 5.6 on the admission - Has received 2 U PRBC transfusion this am -Hemoglobin 7.7 this morning -No overt signs of bleeding    Hyponatremia, hypovolemic - Improving with IV fluid hydration    AKI (acute kidney injury) (HCC) - Due to prerenal etiology, GI losses and dehydration - Continue IV fluids - Follow up BMP in am    UTI - Trace leukocytes on UA on admission - Urine cx with no significant growth - Rocephin given 1 time on admission    Transaminitis in the setting of hepatomegaly -Elevated LFTs -Patient left AMA prior to any further investigation   Procedures:  None  Consultations:  None  Discharge Exam: BP 124/68 (BP Location: Left Arm)   Pulse 96   Temp 99 F (37.2 C) (Oral)   Resp 16   Ht 4\' 11"  (1.499 m)   Wt 79.8 kg (176 lb)   LMP 05/27/2017   SpO2 96%   BMI 35.55 kg/m   General: 32 year old African-American female well-developed well-nourished in no acute distress.  Alert and oriented x3. Cardiovascular: Regular rate and rhythm with no rubs or gallops. Respiratory: Clear to auscultation with no wheezes or rales.  Discharge Instructions You were cared for by a hospitalist during your hospital stay. If you have any questions about  your discharge medications or the care you received while you were in the hospital after you are discharged, you can call the unit and asked to speak with the hospitalist on call if the hospitalist that took care of you is not available. Once you are discharged, your primary care physician will handle any further medical issues. Please note that NO REFILLS for any discharge medications will be authorized once you are discharged, as it is  imperative that you return to your primary care physician (or establish a relationship with a primary care physician if you do not have one) for your aftercare needs so that they can reassess your need for medications and monitor your lab values.   Allergies as of 05/30/2017      Reactions   Percocet [oxycodone-acetaminophen] Swelling   Swelling of the lip.      Medication List    STOP taking these medications   HYDROmorphone 2 MG tablet Commonly known as:  DILAUDID     TAKE these medications   folic acid 1 MG tablet Commonly known as:  FOLVITE Take 1 mg by mouth daily.   hydroxyurea 500 MG capsule Commonly known as:  HYDREA Take 1,000 mg by mouth daily.   ibuprofen 200 MG tablet Commonly known as:  ADVIL,MOTRIN Take 3 tablets (600 mg total) by mouth every 6 (six) hours as needed for mild pain or moderate pain.   JADENU 360 MG Tabs Generic drug:  Deferasirox Take 1,440 mg by mouth daily.   metoprolol succinate 25 MG 24 hr tablet Commonly known as:  TOPROL XL Take 0.5 tablets (12.5 mg total) by mouth daily.      Allergies  Allergen Reactions  . Percocet [Oxycodone-Acetaminophen] Swelling    Swelling of the lip.      The results of significant diagnostics from this hospitalization (including imaging, microbiology, ancillary and laboratory) are listed below for reference.    Significant Diagnostic Studies: Dg Chest 2 View  Result Date: 05/28/2017 CLINICAL DATA:  Cough and congestion with abdominal pain EXAM: CHEST - 2 VIEW COMPARISON:  Chest radiograph Aug 01, 2016 and chest CT Aug 01, 2016. FINDINGS: Power port catheter tip is in the superior vena cava. No pneumothorax. No edema or consolidation. Heart size and pulmonary vascularity are normal. No adenopathy. No bone lesions. IMPRESSION: No edema or consolidation. Port-A-Cath tip in superior vena cava. No pneumothorax. Electronically Signed   By: Bretta Bang III M.D.   On: 05/28/2017 16:52     Microbiology: Recent Results (from the past 240 hour(s))  Urine culture     Status: Abnormal   Collection Time: 05/28/17  3:21 PM  Result Value Ref Range Status   Specimen Description   Final    URINE, RANDOM Performed at [facility], [address] [place], [place] [address]    Special Requests   Final    NONE Performed at [facility], [address] [place], [place] [address]    Culture (A)  Final    <10,000 COLONIES/mL INSIGNIFICANT GROWTH Performed at [facility], [address], [place], [place] [address]    Report Status 05/29/2017 FINAL  Final     Labs: Basic Metabolic Panel: Recent Labs  Lab 05/28/17 1640 05/30/17 0428  NA 130* 133*  K 4.9 5.0  CL 107 108  CO2 15* 19*  GLUCOSE 109* 100*  BUN 22* 16  CREATININE 1.14* 0.70  CALCIUM 10.8* 10.5*   Liver Function Tests: Recent  Labs  Lab 05/28/17 1640 05/30/17 0428  AST 187* 115*  ALT 119* 91*  ALKPHOS 146* 147*  BILITOT 9.7* 6.3*  PROT 9.3* 8.2*  ALBUMIN 3.4* 2.8*   No results for input(s): LIPASE, AMYLASE in the last 168 hours. No results for input(s): AMMONIA in the last 168 hours. CBC: Recent Labs  Lab 05/28/17 1640 05/29/17 2124 05/30/17 0428  WBC 25.2* 26.8* 24.0*  NEUTROABS 17.4* 12.8*  --   HGB 5.6* 7.7* 7.8*  HCT 15.1* 21.2* 21.8*  MCV 100.7* 93.0 96.5  PLT 235 454* 376   Cardiac Enzymes: No results for input(s): CKTOTAL, CKMB, CKMBINDEX, TROPONINI in the last 168 hours. BNP: BNP (last 3 results) No results for input(s): BNP in the last 8760 hours.  ProBNP (last 3 results) No results for input(s): PROBNP in the last 8760 hours.  CBG: No results for input(s): GLUCAP in the last 168 hours.     Signed:  Darlin Droparole N Adelfa Lozito, MD [facility] 05/30/2017, 11:47 AM

## 2017-06-06 LAB — HIV ANTIBODY (ROUTINE TESTING W REFLEX): HIV SCREEN 4TH GENERATION: NONREACTIVE

## 2018-02-16 ENCOUNTER — Encounter (HOSPITAL_BASED_OUTPATIENT_CLINIC_OR_DEPARTMENT_OTHER): Payer: Self-pay

## 2018-02-16 ENCOUNTER — Inpatient Hospital Stay (HOSPITAL_BASED_OUTPATIENT_CLINIC_OR_DEPARTMENT_OTHER)
Admission: EM | Admit: 2018-02-16 | Discharge: 2018-02-19 | DRG: 291 | Disposition: A | Discharge status: Home / Self Care | Payer: Medicaid Other | Attending: Internal Medicine | Admitting: Internal Medicine

## 2018-02-16 ENCOUNTER — Other Ambulatory Visit: Payer: Self-pay

## 2018-02-16 ENCOUNTER — Emergency Department (HOSPITAL_BASED_OUTPATIENT_CLINIC_OR_DEPARTMENT_OTHER): Payer: Medicaid Other

## 2018-02-16 DIAGNOSIS — D649 Anemia, unspecified: Secondary | ICD-10-CM

## 2018-02-16 DIAGNOSIS — D571 Sickle-cell disease without crisis: Secondary | ICD-10-CM | POA: Diagnosis present

## 2018-02-16 DIAGNOSIS — I110 Hypertensive heart disease with heart failure: Principal | ICD-10-CM | POA: Diagnosis present

## 2018-02-16 DIAGNOSIS — Z8673 Personal history of transient ischemic attack (TIA), and cerebral infarction without residual deficits: Secondary | ICD-10-CM

## 2018-02-16 DIAGNOSIS — Z87891 Personal history of nicotine dependence: Secondary | ICD-10-CM

## 2018-02-16 DIAGNOSIS — Z885 Allergy status to narcotic agent status: Secondary | ICD-10-CM

## 2018-02-16 DIAGNOSIS — R0600 Dyspnea, unspecified: Secondary | ICD-10-CM | POA: Diagnosis present

## 2018-02-16 DIAGNOSIS — R7989 Other specified abnormal findings of blood chemistry: Secondary | ICD-10-CM | POA: Diagnosis present

## 2018-02-16 DIAGNOSIS — D5700 Hb-SS disease with crisis, unspecified: Secondary | ICD-10-CM | POA: Diagnosis present

## 2018-02-16 DIAGNOSIS — J9601 Acute respiratory failure with hypoxia: Secondary | ICD-10-CM | POA: Diagnosis present

## 2018-02-16 DIAGNOSIS — I10 Essential (primary) hypertension: Secondary | ICD-10-CM | POA: Diagnosis present

## 2018-02-16 DIAGNOSIS — Z79899 Other long term (current) drug therapy: Secondary | ICD-10-CM

## 2018-02-16 DIAGNOSIS — J101 Influenza due to other identified influenza virus with other respiratory manifestations: Secondary | ICD-10-CM | POA: Diagnosis present

## 2018-02-16 DIAGNOSIS — I5031 Acute diastolic (congestive) heart failure: Secondary | ICD-10-CM | POA: Diagnosis present

## 2018-02-16 LAB — COMPREHENSIVE METABOLIC PANEL WITH GFR
ALT: 83 U/L — ABNORMAL HIGH (ref 0–44)
AST: 104 U/L — ABNORMAL HIGH (ref 15–41)
Albumin: 3.4 g/dL — ABNORMAL LOW (ref 3.5–5.0)
Alkaline Phosphatase: 131 U/L — ABNORMAL HIGH (ref 38–126)
Anion gap: 5 (ref 5–15)
BUN: 14 mg/dL (ref 6–20)
CO2: 21 mmol/L — ABNORMAL LOW (ref 22–32)
Calcium: 8.2 mg/dL — ABNORMAL LOW (ref 8.9–10.3)
Chloride: 107 mmol/L (ref 98–111)
Creatinine, Ser: 0.83 mg/dL (ref 0.44–1.00)
GFR calc Af Amer: >60 mL/min
GFR calc non Af Amer: >60 mL/min
Glucose, Bld: 91 mg/dL (ref 70–99)
Potassium: 5 mmol/L (ref 3.5–5.1)
Sodium: 133 mmol/L — ABNORMAL LOW (ref 135–145)
Total Bilirubin: 11 mg/dL — ABNORMAL HIGH (ref 0.3–1.2)
Total Protein: 9 g/dL — ABNORMAL HIGH (ref 6.5–8.1)

## 2018-02-16 LAB — CBC WITH DIFFERENTIAL/PLATELET
Abs Immature Granulocytes: 0.18 K/uL — ABNORMAL HIGH (ref 0.00–0.07)
Basophils Absolute: 0.1 K/uL (ref 0.0–0.1)
Basophils Relative: 1 %
Eosinophils Absolute: 0.3 K/uL (ref 0.0–0.5)
Eosinophils Relative: 2 %
HCT: 16.9 % — ABNORMAL LOW (ref 36.0–46.0)
Hemoglobin: 5.8 g/dL — CL (ref 12.0–15.0)
Immature Granulocytes: 1 %
Lymphocytes Relative: 38 %
Lymphs Abs: 7.1 K/uL — ABNORMAL HIGH (ref 0.7–4.0)
MCH: 37.9 pg — ABNORMAL HIGH (ref 26.0–34.0)
MCHC: 34.3 g/dL (ref 30.0–36.0)
MCV: 110.5 fL — ABNORMAL HIGH (ref 80.0–100.0)
Monocytes Absolute: 2.5 K/uL — ABNORMAL HIGH (ref 0.1–1.0)
Monocytes Relative: 14 %
Neutro Abs: 8.3 K/uL — ABNORMAL HIGH (ref 1.7–7.7)
Neutrophils Relative %: 44 %
Platelets: 293 K/uL (ref 150–400)
RBC: 1.53 MIL/uL — ABNORMAL LOW (ref 3.87–5.11)
RDW: 17.9 % — ABNORMAL HIGH (ref 11.5–15.5)
WBC: 18.5 K/uL — ABNORMAL HIGH (ref 4.0–10.5)
nRBC: 6.4 % — ABNORMAL HIGH (ref 0.0–0.2)

## 2018-02-16 LAB — URINALYSIS, MICROSCOPIC (REFLEX): WBC, UA: NONE SEEN WBC/hpf (ref 0–5)

## 2018-02-16 LAB — PREGNANCY, URINE: Preg Test, Ur: NEGATIVE

## 2018-02-16 LAB — URINALYSIS, ROUTINE W REFLEX MICROSCOPIC
Glucose, UA: NEGATIVE mg/dL
Ketones, ur: NEGATIVE mg/dL
Leukocytes, UA: NEGATIVE
Nitrite: NEGATIVE
Protein, ur: 30 mg/dL — AB
Specific Gravity, Urine: 1.02 (ref 1.005–1.030)
pH: 5.5 (ref 5.0–8.0)

## 2018-02-16 LAB — RETICULOCYTES
Immature Retic Fract: 31.8 % — ABNORMAL HIGH (ref 2.3–15.9)
RBC.: 1.45 MIL/uL — ABNORMAL LOW (ref 3.87–5.11)
Retic Count, Absolute: 236.6 K/uL — ABNORMAL HIGH (ref 19.0–186.0)
Retic Ct Pct: 16.3 % — ABNORMAL HIGH (ref 0.4–3.1)

## 2018-02-16 LAB — BRAIN NATRIURETIC PEPTIDE: B Natriuretic Peptide: 303.2 pg/mL — ABNORMAL HIGH (ref 0.0–100.0)

## 2018-02-16 LAB — TROPONIN I: Troponin I: <0.03 ng/mL

## 2018-02-16 MED ORDER — FUROSEMIDE 10 MG/ML IJ SOLN
20.0000 mg | Freq: Once | INTRAMUSCULAR | Status: AC
  Administered 2018-02-16: 20 mg via INTRAVENOUS
  Filled 2018-02-16: qty 2

## 2018-02-16 NOTE — ED Notes (Signed)
Patient transported to X-ray 

## 2018-02-16 NOTE — ED Triage Notes (Signed)
C/o cough, diarrhea x 1 week-NAD-steady gait

## 2018-02-16 NOTE — ED Provider Notes (Signed)
Emergency Department Provider Note   I have reviewed the triage vital signs and the nursing notes.   HISTORY  Chief Complaint Cough   HPI Pamela Hudson is a 32 y.o. female with multiple medical problems as documented below the presents to the emergency department today secondary to a dry cough.  Patient states that shortly after Thanksgiving she developed dry cough is progressively worsened in the last 5 or 6 days.  She is also had some mild lower extremity edema for the last few weeks.  She is now also now having dyspnea worse when she lays flat.  She states, and is confirmed by review of the records, that she had a low hemoglobin at her last hematology visit but never got transfused.  After noticing that he had scleral icterus she stated that seem to be worse than her baseline as well.  No fevers.  No abdominal pain.  No vomiting or diarrhea constipation.  No chest pain or back pain is new. No other associated or modifying symptoms.    Past Medical History:  Diagnosis Date  . Cardiomegaly   . Chronic pain   . CVA (cerebral infarction) 02/2002  . Hypertension   . Liver disease    ?iron  . PCOS (polycystic ovarian syndrome)   . Renal stone   . Sickle cell anemia [facility])     Patient Active Problem List   Diagnosis Date Noted  . Dyspnea 02/16/2018  . Gastroenteritis 05/28/2017  . Dehydration 05/28/2017  . AKI (acute kidney injury) (HCC) 05/28/2017  . Sickle cell anemia with crisis (HCC) 05/28/2017  . Hemorrhagic ovarian cyst 12/26/2016  . Hemorrhagic cyst of ovary 12/26/2016  . Pneumonia 11/26/2015  . Sickle cell anemia (HCC) 11/26/2015  . Elevated bilirubin 11/26/2015  . Serum calcium elevated 11/26/2015  . Hyponatremia 11/26/2015  . Leukocytosis 11/26/2015  . CVA (cerebral infarction) 02/13/2012    Past Surgical History:  Procedure Laterality Date  . CHOLECYSTECTOMY    . LIVER BIOPSY  2007  . PARATHYROIDECTOMY    . PORTACATH PLACEMENT      Current Outpatient  Rx  . Order #: [id number] Class: Historical Med  . Order #: [id number] Class: Historical Med  . Order #: [id number] Class: Historical Med  . Order #: [id number] Class: Normal  . Order #: [id number] Class: Print    Allergies Percocet [oxycodone-acetaminophen]  No family history on file.  Social History Social History   Tobacco Use  . Smoking status: Former Games developer  . Smokeless tobacco: Never Used  Substance Use Topics  . Alcohol use: Yes    Comment: occ  . Drug use: No    Review of Systems  All other systems negative except as documented in the HPI. All pertinent positives and negatives as reviewed in the HPI. ____________________________________________   PHYSICAL EXAM:  VITAL SIGNS: ED Triage Vitals  Enc Vitals Group     BP 02/16/18 1737 (!) 144/79     Pulse Rate 02/16/18 1737 (!) 102     Resp 02/16/18 1737 20     Temp 02/16/18 1737 98.7 F (37.1 C)     Temp Source 02/16/18 1737 Oral     SpO2 02/16/18 1737 100 %     Weight 02/16/18 1737 192 lb (87.1 kg)     Height 02/16/18 1737 4\' 11"  (1.499 m)    Constitutional: Alert and oriented. Well appearing and in no acute distress. Eyes: scleral icterus. PERRL. EOMI. Head: Atraumatic. Nose: No congestion/rhinnorhea. Mouth/Throat: Mucous membranes are dry.  Oropharynx non-erythematous. Neck: No  stridor.  No meningeal signs.   Cardiovascular: tachycardic rate, regular rhythm. Good peripheral circulation. Grossly normal heart sounds.   Respiratory: tachypneic respiratory effort.  No retractions. Lungs CTAB. Gastrointestinal: Soft and nontender. No distention.  Musculoskeletal: No lower extremity tenderness nor edema. No gross deformities of extremities. Neurologic:  Normal speech and language. No gross focal neurologic deficits are appreciated.  Skin:  Skin is warm, dry and intact. No rash noted.   ____________________________________________   LABS (all labs ordered are listed, but only abnormal results are  displayed)  Labs Reviewed  CBC WITH DIFFERENTIAL/PLATELET - Abnormal; Notable for the following components:      Result Value   WBC 18.5 (*)    RBC 1.53 (*)    Hemoglobin 5.8 (*)    HCT 16.9 (*)    MCV 110.5 (*)    MCH 37.9 (*)    RDW 17.9 (*)    nRBC 6.4 (*)    Neutro Abs 8.3 (*)    Lymphs Abs 7.1 (*)    Monocytes Absolute 2.5 (*)    Abs Immature Granulocytes 0.18 (*)    All other components within normal limits  COMPREHENSIVE METABOLIC PANEL - Abnormal; Notable for the following components:   Sodium 133 (*)    CO2 21 (*)    Calcium 8.2 (*)    Total Protein 9.0 (*)    Albumin 3.4 (*)    AST 104 (*)    ALT 83 (*)    Alkaline Phosphatase 131 (*)    Total Bilirubin 11.0 (*)    All other components within normal limits  RETICULOCYTES - Abnormal; Notable for the following components:   Retic Ct Pct 16.3 (*)    RBC. 1.45 (*)    Retic Count, Absolute 236.6 (*)    Immature Retic Fract 31.8 (*)    All other components within normal limits  BRAIN NATRIURETIC PEPTIDE - Abnormal; Notable for the following components:   B Natriuretic Peptide 303.2 (*)    All other components within normal limits  URINALYSIS, ROUTINE W REFLEX MICROSCOPIC - Abnormal; Notable for the following components:   Color, Urine BROWN (*)    Hgb urine dipstick MODERATE (*)    Bilirubin Urine MODERATE (*)    Protein, ur 30 (*)    All other components within normal limits  URINALYSIS, MICROSCOPIC (REFLEX) - Abnormal; Notable for the following components:   Bacteria, UA RARE (*)    All other components within normal limits  CULTURE, BLOOD (ROUTINE X 2)  CULTURE, BLOOD (ROUTINE X 2)  PREGNANCY, URINE  TROPONIN I   ____________________________________________  EKG  My ECG Read Indication:sob EKG was personally contemporaneously reviewed by myself. Rate: 101 PR Interval: 183 QRS duration: 80 QT/QTC: 367/476 Axis: right axis EKG: nonspecific ST and T waves changes, sinus  tachycardia.   ____________________________________________  RADIOLOGY  Dg Chest 2 View  Result Date: 02/16/2018 CLINICAL DATA:  Dry cough and shortness of breath. EXAM: CHEST - 2 VIEW COMPARISON:  12/01/2017 FINDINGS: The right IJ power port appears stable. The heart is borderline enlarged but unchanged. Mild central vascular congestion but no overt pulmonary edema, pleural effusions or focal infiltrates. IMPRESSION: Stable mild cardiac enlargement with central vascular congestion but no overt pulmonary edema or pleural effusions. Electronically Signed   By: Rudie MeyerP.  Gallerani M.D.   On: 02/16/2018 18:51    ____________________________________________   PROCEDURES  Procedure(s) performed:   Procedures   ____________________________________________   INITIAL IMPRESSION / ASSESSMENT AND PLAN / ED COURSE  Patient's lungs are clear making unlikely as she has a pneumonia or bronchitis.  Considered influenza but she has not had a fever and did get a flu shot this year making that less likely.  Consider PE with her tachypnea and tachycardia however think more likely is probably anemia.  Will recheck labs to include BNP and troponin and reevaluate for need for further work-up or disposition.  Anemic. Elevated BNP. ECG witih nonspecific changes and new RAD. Considered PE but no hypoxia or tachypnea at this time and tachycardia/sob can be explained by anemia. If these don't improve with blood products, would consider CT.   Discussed with Dr. Toniann Fail who requests Lasix but will admit to [place].   Pertinent labs & imaging results that were available during my care of the patient were reviewed by me and considered in my medical decision making (see chart for details).  ____________________________________________  FINAL CLINICAL IMPRESSION(S) / ED DIAGNOSES  Final diagnoses:  Anemia, unspecified type     MEDICATIONS GIVEN DURING THIS VISIT:  Medications  furosemide (LASIX)  injection 20 mg (20 mg Intravenous Given 02/16/18 2134)     NEW OUTPATIENT MEDICATIONS STARTED DURING THIS VISIT:  New Prescriptions   No medications on file    Note:  This note was prepared with assistance of Dragon voice recognition software. Occasional wrong-word or sound-a-like substitutions may have occurred due to the inherent limitations of voice recognition software.   Marily Memos, MD 02/16/18 2222

## 2018-02-17 ENCOUNTER — Other Ambulatory Visit: Payer: Self-pay

## 2018-02-17 ENCOUNTER — Observation Stay (HOSPITAL_COMMUNITY): Payer: Medicaid Other

## 2018-02-17 ENCOUNTER — Encounter (HOSPITAL_COMMUNITY): Payer: Self-pay | Admitting: Internal Medicine

## 2018-02-17 ENCOUNTER — Observation Stay (HOSPITAL_BASED_OUTPATIENT_CLINIC_OR_DEPARTMENT_OTHER): Payer: Medicaid Other

## 2018-02-17 DIAGNOSIS — I340 Nonrheumatic mitral (valve) insufficiency: Secondary | ICD-10-CM | POA: Diagnosis not present

## 2018-02-17 DIAGNOSIS — Z87891 Personal history of nicotine dependence: Secondary | ICD-10-CM | POA: Diagnosis not present

## 2018-02-17 DIAGNOSIS — I361 Nonrheumatic tricuspid (valve) insufficiency: Secondary | ICD-10-CM

## 2018-02-17 DIAGNOSIS — M7989 Other specified soft tissue disorders: Secondary | ICD-10-CM | POA: Diagnosis not present

## 2018-02-17 DIAGNOSIS — I110 Hypertensive heart disease with heart failure: Secondary | ICD-10-CM | POA: Diagnosis present

## 2018-02-17 DIAGNOSIS — J9601 Acute respiratory failure with hypoxia: Secondary | ICD-10-CM | POA: Diagnosis present

## 2018-02-17 DIAGNOSIS — I10 Essential (primary) hypertension: Secondary | ICD-10-CM

## 2018-02-17 DIAGNOSIS — D5700 Hb-SS disease with crisis, unspecified: Secondary | ICD-10-CM | POA: Diagnosis present

## 2018-02-17 DIAGNOSIS — I5031 Acute diastolic (congestive) heart failure: Secondary | ICD-10-CM | POA: Diagnosis present

## 2018-02-17 DIAGNOSIS — D649 Anemia, unspecified: Secondary | ICD-10-CM | POA: Diagnosis present

## 2018-02-17 DIAGNOSIS — R7989 Other specified abnormal findings of blood chemistry: Secondary | ICD-10-CM | POA: Diagnosis present

## 2018-02-17 DIAGNOSIS — Z79899 Other long term (current) drug therapy: Secondary | ICD-10-CM | POA: Diagnosis not present

## 2018-02-17 DIAGNOSIS — D571 Sickle-cell disease without crisis: Secondary | ICD-10-CM

## 2018-02-17 DIAGNOSIS — Z8673 Personal history of transient ischemic attack (TIA), and cerebral infarction without residual deficits: Secondary | ICD-10-CM | POA: Diagnosis not present

## 2018-02-17 DIAGNOSIS — J101 Influenza due to other identified influenza virus with other respiratory manifestations: Secondary | ICD-10-CM | POA: Diagnosis present

## 2018-02-17 DIAGNOSIS — Z885 Allergy status to narcotic agent status: Secondary | ICD-10-CM | POA: Diagnosis not present

## 2018-02-17 LAB — RESPIRATORY PANEL BY PCR
Adenovirus: NOT DETECTED
Bordetella pertussis: NOT DETECTED
Chlamydophila pneumoniae: NOT DETECTED
Coronavirus 229E: NOT DETECTED
Coronavirus HKU1: NOT DETECTED
Coronavirus NL63: NOT DETECTED
Coronavirus OC43: NOT DETECTED
Influenza A: NOT DETECTED
Influenza B: DETECTED — AB
Metapneumovirus: NOT DETECTED
Mycoplasma pneumoniae: NOT DETECTED
Parainfluenza Virus 1: NOT DETECTED
Parainfluenza Virus 2: NOT DETECTED
Parainfluenza Virus 3: NOT DETECTED
Parainfluenza Virus 4: NOT DETECTED
Respiratory Syncytial Virus: NOT DETECTED
Rhinovirus / Enterovirus: NOT DETECTED

## 2018-02-17 LAB — CBC WITH DIFFERENTIAL/PLATELET
Abs Immature Granulocytes: 0.17 10*3/uL — ABNORMAL HIGH (ref 0.00–0.07)
Basophils Absolute: 0.2 10*3/uL — ABNORMAL HIGH (ref 0.0–0.1)
Basophils Relative: 1 %
Eosinophils Absolute: 0.3 10*3/uL (ref 0.0–0.5)
Eosinophils Relative: 2 %
HCT: 16.7 % — ABNORMAL LOW (ref 36.0–46.0)
Hemoglobin: 5.8 g/dL — CL (ref 12.0–15.0)
Immature Granulocytes: 1 %
Lymphocytes Relative: 41 %
Lymphs Abs: 6.4 10*3/uL — ABNORMAL HIGH (ref 0.7–4.0)
MCH: 38.9 pg — ABNORMAL HIGH (ref 26.0–34.0)
MCHC: 34.7 g/dL (ref 30.0–36.0)
MCV: 112.1 fL — ABNORMAL HIGH (ref 80.0–100.0)
MONO ABS: 2.1 10*3/uL — AB (ref 0.1–1.0)
Monocytes Relative: 14 %
Neutro Abs: 6.6 10*3/uL (ref 1.7–7.7)
Neutrophils Relative %: 41 %
Platelets: 286 10*3/uL (ref 150–400)
RBC: 1.49 MIL/uL — AB (ref 3.87–5.11)
RDW: 17.8 % — ABNORMAL HIGH (ref 11.5–15.5)
WBC: 15.7 10*3/uL — AB (ref 4.0–10.5)
nRBC: 7.3 % — ABNORMAL HIGH (ref 0.0–0.2)

## 2018-02-17 LAB — LIPID PANEL
Cholesterol: 87 mg/dL (ref 0–200)
HDL: <10 mg/dL — ABNORMAL LOW (ref >40)
Triglycerides: 87 mg/dL (ref <150)
VLDL: 17 mg/dL (ref 0–40)

## 2018-02-17 LAB — BASIC METABOLIC PANEL
Anion gap: 9 (ref 5–15)
BUN: 13 mg/dL (ref 6–20)
CO2: 21 mmol/L — ABNORMAL LOW (ref 22–32)
CREATININE: 0.7 mg/dL (ref 0.44–1.00)
Calcium: 8.5 mg/dL — ABNORMAL LOW (ref 8.9–10.3)
Chloride: 106 mmol/L (ref 98–111)
GFR calc Af Amer: >60 mL/min (ref >60)
GFR calc non Af Amer: >60 mL/min (ref >60)
Glucose, Bld: 114 mg/dL — ABNORMAL HIGH (ref 70–99)
Potassium: 5.2 mmol/L — ABNORMAL HIGH (ref 3.5–5.1)
Sodium: 136 mmol/L (ref 135–145)

## 2018-02-17 LAB — HEPATIC FUNCTION PANEL
ALK PHOS: 117 U/L (ref 38–126)
ALT: 77 U/L — ABNORMAL HIGH (ref 0–44)
AST: 110 U/L — ABNORMAL HIGH (ref 15–41)
Albumin: 3.2 g/dL — ABNORMAL LOW (ref 3.5–5.0)
Bilirubin, Direct: 3.2 mg/dL — ABNORMAL HIGH (ref 0.0–0.2)
Indirect Bilirubin: 6.5 mg/dL — ABNORMAL HIGH (ref 0.3–0.9)
Total Bilirubin: 9.7 mg/dL — ABNORMAL HIGH (ref 0.3–1.2)
Total Protein: 8.6 g/dL — ABNORMAL HIGH (ref 6.5–8.1)

## 2018-02-17 LAB — ECHOCARDIOGRAM COMPLETE
Height: 59 in
Weight: 3008.84 oz

## 2018-02-17 LAB — GLUCOSE, CAPILLARY: Glucose-Capillary: 97 mg/dL (ref 70–99)

## 2018-02-17 LAB — MAGNESIUM: Magnesium: 1.6 mg/dL — ABNORMAL LOW (ref 1.7–2.4)

## 2018-02-17 LAB — INFLUENZA PANEL BY PCR (TYPE A & B)
INFLBPCR: POSITIVE — AB
Influenza A By PCR: NEGATIVE

## 2018-02-17 LAB — D-DIMER, QUANTITATIVE (NOT AT ARMC): D DIMER QUANT: 2.43 ug{FEU}/mL — AB (ref 0.00–0.50)

## 2018-02-17 LAB — TROPONIN I
Troponin I: <0.03 ng/mL (ref <0.03)
Troponin I: <0.03 ng/mL (ref <0.03)

## 2018-02-17 LAB — PREPARE RBC (CROSSMATCH)

## 2018-02-17 MED ORDER — HEPARIN SODIUM (PORCINE) 5000 UNIT/ML IJ SOLN
4000.0000 [IU] | Freq: Once | INTRAMUSCULAR | Status: AC
  Administered 2018-02-17: 4000 [IU] via INTRAVENOUS

## 2018-02-17 MED ORDER — ENOXAPARIN SODIUM 40 MG/0.4ML ~~LOC~~ SOLN: 40.0000 mg | SUBCUTANEOUS | Status: DC

## 2018-02-17 MED ORDER — MAGNESIUM OXIDE 400 (241.3 MG) MG PO TABS
400.0000 mg | ORAL_TABLET | Freq: Every day | ORAL | Status: DC
  Administered 2018-02-17 – 2018-02-19 (×3): 400 mg via ORAL
  Filled 2018-02-17 (×3): qty 1

## 2018-02-17 MED ORDER — METOPROLOL SUCCINATE ER 25 MG PO TB24
12.5000 mg | ORAL_TABLET | Freq: Every day | ORAL | Status: DC
  Administered 2018-02-17: 12.5 mg via ORAL
  Filled 2018-02-17: qty 1

## 2018-02-17 MED ORDER — DEFERASIROX 360 MG PO TABS: 720.0000 mg | ORAL_TABLET | Freq: Every day | ORAL | Status: DC

## 2018-02-17 MED ORDER — VITAMIN D (ERGOCALCIFEROL) 1.25 MG (50000 UNIT) PO CAPS
100000.0000 [IU] | ORAL_CAPSULE | ORAL | Status: DC
  Administered 2018-02-17: 100000 [IU] via ORAL
  Filled 2018-02-17: qty 2

## 2018-02-17 MED ORDER — ATORVASTATIN CALCIUM 40 MG PO TABS: 80.0000 mg | ORAL_TABLET | Freq: Once | ORAL | Status: DC

## 2018-02-17 MED ORDER — OSELTAMIVIR PHOSPHATE 75 MG PO CAPS
75.0000 mg | ORAL_CAPSULE | Freq: Two times a day (BID) | ORAL | Status: DC
  Administered 2018-02-17 – 2018-02-19 (×5): 75 mg via ORAL
  Filled 2018-02-17 (×6): qty 1

## 2018-02-17 MED ORDER — GUAIFENESIN-DM 100-10 MG/5ML PO SYRP
5.0000 mL | ORAL_SOLUTION | ORAL | Status: DC | PRN
  Administered 2018-02-17 – 2018-02-19 (×5): 5 mL via ORAL
  Filled 2018-02-17 (×5): qty 10

## 2018-02-17 MED ORDER — FOLIC ACID 1 MG PO TABS
1.0000 mg | ORAL_TABLET | Freq: Every day | ORAL | Status: DC
  Administered 2018-02-17 – 2018-02-19 (×3): 1 mg via ORAL
  Filled 2018-02-17 (×3): qty 1

## 2018-02-17 MED ORDER — TRIAMTERENE-HCTZ 37.5-25 MG PO TABS
1.0000 | ORAL_TABLET | Freq: Every day | ORAL | Status: DC
  Administered 2018-02-17 – 2018-02-19 (×3): 1 via ORAL
  Filled 2018-02-17 (×3): qty 1

## 2018-02-17 MED ORDER — SODIUM CHLORIDE 0.9% FLUSH: 10.0000 mL | INTRAVENOUS | Status: DC | PRN

## 2018-02-17 MED ORDER — ASPIRIN 325 MG PO TABS
325.0000 mg | ORAL_TABLET | Freq: Once | ORAL | Status: DC
  Filled 2018-02-17: qty 1

## 2018-02-17 MED ORDER — SODIUM CHLORIDE 0.9% IV SOLUTION
Freq: Once | INTRAVENOUS | Status: AC
  Administered 2018-02-17: 16:00:00 via INTRAVENOUS

## 2018-02-17 MED ORDER — HEPARIN (PORCINE) 25000 UT/250ML-% IV SOLN
1000.0000 [IU]/h | INTRAVENOUS | Status: DC
  Administered 2018-02-17: 1000 [IU]/h via INTRAVENOUS
  Filled 2018-02-17: qty 250

## 2018-02-17 NOTE — Progress Notes (Signed)
ANTICOAGULATION CONSULT NOTE - Initial Consult  Pharmacy Consult for Heparin Indication: chest pain/ACS  Allergies  Allergen Reactions  . Percocet [Oxycodone-Acetaminophen] Swelling    Swelling of the lip.    Patient Measurements: Height: 4\' 11"  (149.9 cm) Weight: 188 lb 0.8 oz (85.3 kg) IBW/kg (Calculated) : 43.2 Heparin Dosing Weight:   Vital Signs: Temp: 98.9 F (37.2 C) (12/06 0415) Temp Source: Oral (12/06 0415) BP: 125/58 (12/06 0415) Pulse Rate: 100 (12/06 0415)  Labs: Recent Labs    02/16/18 1825  HGB 5.8*  HCT 16.9*  PLT 293  CREATININE 0.83  TROPONINI <0.03    Estimated Creatinine Clearance: 93.9 mL/min (by C-G formula based on SCr of 0.83 mg/dL).   Medical History: Past Medical History:  Diagnosis Date  . Cardiomegaly   . Chronic pain   . CVA (cerebral infarction) 02/2002  . Hypertension   . Liver disease    ?iron  . PCOS (polycystic ovarian syndrome)   . Renal stone   . Sickle cell anemia (HCC)     Medications:  Infusions:  . heparin      Assessment: Patient with NSTEMI.  Enoxparin ordered but not charted was d/c'd.  Patient with low hgb, but will still normal ACS bolus due to usual low hgb in sickle cell patients.  Goal of Therapy:  Heparin level 0.3-0.7 units/ml Monitor platelets by anticoagulation protocol: Yes   Plan:  Heparin bolus 4000 units iv x1 Heparin drip at 1000  units/hr Daily CBC Next heparin level at 1600 D/c enoxaparin    Darlina GuysGrimsley Jr, Jacquenette ShoneJulian Crowford 02/17/2018,7:04 AM

## 2018-02-17 NOTE — Consult Note (Signed)
CARDIOLOGY CONSULT NOTE  Patient ID: Pamela Hudson MRN: [id number] DOB/AGE: 12-Jun-1985 13 y.o.  Admit date: 02/16/2018 Referring Physician  Jani Gravel, MD Primary Physician:  System, Pcp Not In Reason for Consultation  Abnormal EKG  HPI: Brittny Spangle  is a 32 y.o. female  With sickle cell anemia, has not been under crisis for quite a long time, presently working into jobs, had developed upper respiratory infection and cough and cold and sneezing over the Thanksgiving weekend.  She started getting more short of breath as days went by, due to leg edema and shortness of breath, she presented to the emergency room yesterday.  She received IV diuretics, with improvement in leg edema and dyspnea, was transferred to the floor where an EKG was obtained which revealed features consistent with inferior STEMI.  However repeat EKG revealed normalization of the ST changes.  I was consulted regarding abnormal EKG and also to evaluate for shortness of breath and leg edema.  Patient is presently feeling better but still has shortness of breath.  Past Medical History:  Diagnosis Date  . Cardiomegaly   . Chronic pain   . CVA (cerebral infarction) 02/2002  . Hypertension   . Liver disease    ?iron  . PCOS (polycystic ovarian syndrome)   . Renal stone   . Sickle cell anemia (HCC)      Past Surgical History:  Procedure Laterality Date  . CHOLECYSTECTOMY    . LIVER BIOPSY  2007  . PARATHYROIDECTOMY    . PORTACATH PLACEMENT       Family History  Problem Relation Age of Onset  . Diabetes Mellitus II Father   . CAD Father   . Diabetes Mellitus II Paternal Grandmother      Social History: Social History   Socioeconomic History  . Marital status: Single    Spouse name: Not on file  . Number of children: Not on file  . Years of education: Not on file  . Highest education level: Not on file  Occupational History  . Not on file  Social Needs  . Financial resource strain: Not on file  .  Food insecurity:    Worry: Not on file    Inability: Not on file  . Transportation needs:    Medical: Not on file    Non-medical: Not on file  Tobacco Use  . Smoking status: Former Research scientist (life sciences)  . Smokeless tobacco: Never Used  Substance and Sexual Activity  . Alcohol use: Yes    Comment: occ  . Drug use: No  . Sexual activity: Yes    Birth control/protection: Implant  Lifestyle  . Physical activity:    Days per week: Not on file    Minutes per session: Not on file  . Stress: Not on file  Relationships  . Social connections:    Talks on phone: Not on file    Gets together: Not on file    Attends religious service: Not on file    Active member of club or organization: Not on file    Attends meetings of clubs or organizations: Not on file    Relationship status: Not on file  . Intimate partner violence:    Fear of current or ex partner: Not on file    Emotionally abused: Not on file    Physically abused: Not on file    Forced sexual activity: Not on file  Other Topics Concern  . Not on file  Social History Narrative  . Not on file  Medications Prior to Admission  Medication Sig Dispense Refill Last Dose  . Deferasirox (JADENU) 360 MG TABS Take 720 mg by mouth daily.    02/16/2018 at Unknown time  . folic acid (FOLVITE) 1 MG tablet Take 1 mg by mouth daily.   02/16/2018 at Unknown time  . hydroxyurea (HYDREA) 500 MG capsule Take 1,000 mg by mouth daily.    02/16/2018 at Unknown time  . magnesium oxide (MAG-OX) 400 MG tablet Take 400 mg by mouth daily.  5 02/16/2018 at Unknown time  . metoprolol succinate (TOPROL XL) 25 MG 24 hr tablet Take 0.5 tablets (12.5 mg total) by mouth daily. 30 tablet 1 02/16/2018 at 0900  . Vitamin D, Ergocalciferol, (DRISDOL) 1.25 MG (50000 UT) CAPS capsule Take 2 capsules by mouth every 7 (seven) days.  11 Past Week at Unknown time  . ibuprofen (ADVIL,MOTRIN) 200 MG tablet Take 3 tablets (600 mg total) by mouth every 6 (six) hours as needed for mild  pain or moderate pain. (Patient not taking: Reported on 02/17/2018) 30 tablet 3 Not Taking at Unknown time   Review of Systems  Constitutional: Negative.   HENT: Negative.   Eyes: Negative.   Respiratory: Positive for cough and shortness of breath.   Cardiovascular: Positive for orthopnea and leg swelling. Negative for chest pain and PND.  Gastrointestinal: Negative.   Genitourinary: Negative.   Skin: Negative.   Neurological: Negative.   Endo/Heme/Allergies: Bruises/bleeds easily.  Psychiatric/Behavioral: Negative.   All other systems reviewed and are negative. Physical Exam: Blood pressure 139/62, pulse 99, temperature 98.6 F (37 C), temperature source Oral, resp. rate 18, height '4\' 11"'  (1.499 m), weight 85.3 kg, SpO2 95 %.  Physical Exam  Constitutional: She is oriented to person, place, and time. She appears well-developed and well-nourished. She appears distressed (Mild respiratory distress).  HENT:  Head: Atraumatic.  There is bilateral icterus.  Neck: JVD present.  Cardiovascular: S1 normal and S2 normal. Tachycardia present. Exam reveals gallop and S4.  Murmur heard.  Holosystolic murmur is present at the lower left sternal border. Pulmonary/Chest: Effort normal and breath sounds normal.  Abdominal: Normal appearance. There is splenomegaly and hepatomegaly. There is tenderness in the right upper quadrant.  Musculoskeletal: Normal range of motion. She exhibits edema (2+ pitting edema).  Neurological: She is alert and oriented to person, place, and time.  Skin: Skin is warm and dry.  Psychiatric: She has a normal mood and affect.    Labs:   Lab Results  Component Value Date   WBC 15.7 (H) 02/17/2018   HGB 5.8 (LL) 02/17/2018   HCT 16.7 (L) 02/17/2018   MCV 112.1 (H) 02/17/2018   PLT 286 02/17/2018    Recent Labs  Lab 02/17/18 0717  NA 136  K 5.2*  CL 106  CO2 21*  BUN 13  CREATININE 0.70  CALCIUM 8.5*  PROT 8.6*  BILITOT 9.7*  ALKPHOS 117  ALT 77*  AST  110*  GLUCOSE 114*   Hepatic Function Latest Ref Rng & Units 02/17/2018 02/16/2018 05/30/2017  Total Protein 6.5 - 8.1 g/dL 8.6(H) 9.0(H) 8.2(H)  Albumin 3.5 - 5.0 g/dL 3.2(L) 3.4(L) 2.8(L)  AST 15 - 41 U/L 110(H) 104(H) 115(H)  ALT 0 - 44 U/L 77(H) 83(H) 91(H)  Alk Phosphatase 38 - 126 U/L 117 131(H) 147(H)  Total Bilirubin 0.3 - 1.2 mg/dL 9.7(H) 11.0(H) 6.3(H)  Bilirubin, Direct 0.0 - 0.2 mg/dL 3.2(H) - -     Lipid Panel  No results found for: CHOL,  TRIG, HDL, CHOLHDL, VLDL, LDLCALC  BNP (last 3 results) Recent Labs    02/16/18 1825  BNP 303.2*    HEMOGLOBIN A1C No results found for: HGBA1C, MPG  Cardiac Panel (last 3 results) Recent Labs    02/16/18 1825 02/17/18 0717 02/17/18 1550  TROPONINI <0.03 <0.03 <0.03   TSH No results for input(s): TSH in the last 8760 hours.    Radiology: Dg Chest 2 View  Result Date: 02/16/2018 CLINICAL DATA:  Dry cough and shortness of breath. EXAM: CHEST - 2 VIEW COMPARISON:  12/01/2017 FINDINGS: The right IJ power port appears stable. The heart is borderline enlarged but unchanged. Mild central vascular congestion but no overt pulmonary edema, pleural effusions or focal infiltrates. IMPRESSION: Stable mild cardiac enlargement with central vascular congestion but no overt pulmonary edema or pleural effusions. Electronically Signed   By: Marijo Sanes M.D.   On: 02/16/2018 18:51   Vas [place] Lower Extremity Venous (dvt)  Result Date: 02/17/2018  Lower Venous Study Indications: Swelling, and sickle cell anemia.  Performing Technologist: Landry Mellow RDMS, RVT  Examination Guidelines: A complete evaluation includes B-mode imaging, spectral Doppler, color Doppler, and power Doppler as needed of all accessible portions of each vessel. Bilateral testing is considered an integral part of a complete examination. Limited examinations for reoccurring indications may be performed as noted.  Right Venous Findings:  +---------+---------------+---------+-----------+----------+--------------+          CompressibilityPhasicitySpontaneityPropertiesSummary        +---------+---------------+---------+-----------+----------+--------------+ CFV      Full           Yes      Yes                  pulsatile flow +---------+---------------+---------+-----------+----------+--------------+ SFJ      Full                                                        +---------+---------------+---------+-----------+----------+--------------+ FV Prox  Full           Yes      Yes                                 +---------+---------------+---------+-----------+----------+--------------+ FV Mid   Full                                                        +---------+---------------+---------+-----------+----------+--------------+ FV Distal               Yes      Yes                                 +---------+---------------+---------+-----------+----------+--------------+ PFV      Full                                                        +---------+---------------+---------+-----------+----------+--------------+ POP  Full           Yes      Yes                  pulsatile flow +---------+---------------+---------+-----------+----------+--------------+ PTV      Full                                                        +---------+---------------+---------+-----------+----------+--------------+ PERO     Full                                                        +---------+---------------+---------+-----------+----------+--------------+  Left Venous Findings: +---------+---------------+---------+-----------+----------+--------------+          CompressibilityPhasicitySpontaneityPropertiesSummary        +---------+---------------+---------+-----------+----------+--------------+ CFV      Full           Yes      Yes                  pulsatile flow  +---------+---------------+---------+-----------+----------+--------------+ SFJ      Full                                                        +---------+---------------+---------+-----------+----------+--------------+ FV Prox  Full           Yes      Yes                                 +---------+---------------+---------+-----------+----------+--------------+ FV Mid   Full                                                        +---------+---------------+---------+-----------+----------+--------------+ FV Distal               Yes      Yes                                 +---------+---------------+---------+-----------+----------+--------------+ PFV      Full                                                        +---------+---------------+---------+-----------+----------+--------------+ POP      Full           Yes      Yes                  pulsatile flow +---------+---------------+---------+-----------+----------+--------------+ PTV      Full                                                        +---------+---------------+---------+-----------+----------+--------------+  PERO     Full                                                        +---------+---------------+---------+-----------+----------+--------------+    Summary: Right: There is no evidence of deep vein thrombosis in the lower extremity. No cystic structure found in the popliteal fossa. Pulsatile venous flow suggestive of increased right side heart pressure. Left: There is no evidence of deep vein thrombosis in the lower extremity. No cystic structure found in the popliteal fossa. Pulsatile venous flow suggestive of increased right side heart pressure.  *See table(s) above for measurements and observations.    Preliminary     Scheduled Meds: . aspirin  325 mg Oral Once  . atorvastatin  80 mg Oral Once  . Deferasirox  720 mg Oral Daily  . folic acid  1 mg Oral Daily  . magnesium oxide  400  mg Oral Daily  . metoprolol succinate  12.5 mg Oral Daily  . oseltamivir  75 mg Oral BID  . Vitamin D (Ergocalciferol)  100,000 Units Oral Q7 days   Continuous Infusions: PRN Meds:.guaiFENesin-dextromethorphan, sodium chloride flush  CARDIAC STUDIES:  EKG: 6:30 AM had revealed ST elevation in the inferior leads with reciprocal ST depression in the lateral leads consistent with STEMI.  Repeat EKG reveals normal sinus rhythm.  ECHO 02/17/2018: Left ventricular cavity size was normal.  I will diastolic function.  Mild MR, severe TR.  PASP 56 mmHg.  Scheduled Meds: . aspirin  325 mg Oral Once  . atorvastatin  80 mg Oral Once  . Deferasirox  720 mg Oral Daily  . folic acid  1 mg Oral Daily  . magnesium oxide  400 mg Oral Daily  . metoprolol succinate  12.5 mg Oral Daily  . oseltamivir  75 mg Oral BID  . Vitamin D (Ergocalciferol)  100,000 Units Oral Q7 days   Continuous Infusions: PRN Meds:.guaiFENesin-dextromethorphan, sodium chloride flush   ASSESSMENT AND PLAN:  1.  Acute diastolic heart failure due to severe anemia.  2.  Severe TR, in a patient with sickle cell disease, chronic PE and chronic pulmonary hypertension with RV strain and severe TR needs to be excluded.  However diagnosis of CTEP needs to be excluded once anemia is corrected and if he persist to have TR and pulmonary hypertension.  TR could also be related to physiologic stress from symptomatic severe anemia, patient probably has sickle cell crisis with hyperbilirubinemia.  3.  Abnormal EKG, patient had transient ST elevation in the inferior leads suggestive of STEMI, complete resolution, without any chest pain or cardiac enzyme leak.  Suspect she may have had transient coronary spasm.  Recommendation: Gentle diuresis, patient has received IV Lasix in the emergency room with significant improvement in leg edema and also dyspnea.  Correction of anemia would automatically correct her heart failure.  Would probably not  prescribe continuous diuretics.  I will start her on Maxide 37.5/25 mg 1 p.o. every morning until discharge or symptomatically better.  Discontinue aspirin if not indicated by hematology.  Also discontinue atorvastatin if no hyperlipidemia, also has abnormal liver enzymes.    Patient was started on low-dose beta-blocker after the diagnosis of STEMI, this can also be discontinued as heart failure is related to underlying severe anemia.  Consider hematology consultation.  Check TSH and  also lipid profile testing. Consider VQ scan to exclude pulmonary embolism.  Adrian Prows, MD 02/17/2018, 7:09 PM [facility] [place] Pager: [phone] Office: [phone] If no answer Cell [phone]

## 2018-02-17 NOTE — Progress Notes (Signed)
2D Echocardiogram has been performed.  Pamela Hudson 02/17/2018, 2:03 PM

## 2018-02-17 NOTE — Progress Notes (Signed)
LE venous duplex       has been completed. Preliminary results can be found under CV proc through chart review. Galvin Aversa Eunice, RDMS, RVT   

## 2018-02-17 NOTE — Progress Notes (Signed)
STAT EKG completed, NSTEMI. MD and RRN paged. Pt c/o no chest pain or discomfort, some shortness of breath, otherwise resting comfortably in bed. MD reviewed EKG and will consult cardiology. Will continue to monitor pt.  Justin Mendaudle, Brook Geraci H, RN

## 2018-02-17 NOTE — Progress Notes (Addendum)
Patient stable.  Blood started at 1644.  Resting quietly. Will continue to monitor for any further needed interventions.  Levora AngelHannah V Dorinne Graeff, RN

## 2018-02-17 NOTE — Progress Notes (Signed)
CRITICAL VALUE ALERT  Critical Value:  Hgb 5.8  Date & Time Notied:  12/6 @ 0804  Provider Notified: Dr. David StallFeliz Ortiz  Orders Received/Actions taken: Blood ordered

## 2018-02-17 NOTE — H&P (Addendum)
History and Physical    Pamela GongDevon Horseman GNF:[id number] DOB: 24-Apr-1985 DOA: 02/16/2018  PCP: System, Pcp Not In  Patient coming from: Home.  Chief Complaint: Shortness of breath.  HPI: Pamela Hudson is a 32 y.o. female with history of sickle cell anemia, hypertension presents to the ER at [facility] with complaints of increasing shortness of breath and nasal congestion ongoing for last 1 week.  Patient also noticed increasing lower extremity edema.  Denies any chest pain.  Has been having some productive cough denies fever chills.  Shortness of breath is increased on exertion.  Denies any orthopnea or paroxysmal nocturnal dyspnea.  ED Course: In the ER chest x-ray shows congestion.  Patient was afebrile.  Has bilateral lower extremity edema on exam.  Was given 20 mg of IV Lasix and had output of 2 L.  Patient's hemoglobin is around 5 baseline is usually around 7.  Review of Systems: As per HPI, rest all negative.   Past Medical History:  Diagnosis Date  . Cardiomegaly   . Chronic pain   . CVA (cerebral infarction) 02/2002  . Hypertension   . Liver disease    ?iron  . PCOS (polycystic ovarian syndrome)   . Renal stone   . Sickle cell anemia (HCC)     Past Surgical History:  Procedure Laterality Date  . CHOLECYSTECTOMY    . LIVER BIOPSY  2007  . PARATHYROIDECTOMY    . PORTACATH PLACEMENT       reports that she has quit smoking. She has never used smokeless tobacco. She reports that she drinks alcohol. She reports that she does not use drugs.  Allergies  Allergen Reactions  . Percocet [Oxycodone-Acetaminophen] Swelling    Swelling of the lip.    Family History  Problem Relation Age of Onset  . Diabetes Mellitus II Father   . CAD Father   . Diabetes Mellitus II Paternal Grandmother     Prior to Admission medications   Medication Sig Start Date End Date Taking? Authorizing Provider  Deferasirox (JADENU) 360 MG TABS Take 720 mg by mouth daily.    Yes  [provider]  folic acid (FOLVITE) 1 MG tablet Take 1 mg by mouth daily. 11/21/15  Yes [provider]  hydroxyurea (HYDREA) 500 MG capsule Take 1,000 mg by mouth daily.    Yes [provider]  magnesium oxide (MAG-OX) 400 MG tablet Take 400 mg by mouth daily. 01/26/18  Yes [provider]  metoprolol succinate (TOPROL XL) 25 MG 24 hr tablet Take 0.5 tablets (12.5 mg total) by mouth daily. 11/29/15  Yes Quentin AngstJegede, Olugbemiga E, MD  Vitamin D, Ergocalciferol, (DRISDOL) 1.25 MG (50000 UT) CAPS capsule Take 2 capsules by mouth every 7 (seven) days. 01/26/18  Yes [provider]  ibuprofen (ADVIL,MOTRIN) 200 MG tablet Take 3 tablets (600 mg total) by mouth every 6 (six) hours as needed for mild pain or moderate pain. Patient not taking: Reported on 02/17/2018 12/27/16   Constant, Gigi GinPeggy, MD    Physical Exam: Vitals:   02/17/18 0030 02/17/18 0200 02/17/18 0319 02/17/18 0415  BP: 129/73 136/79 126/74 (!) 125/58  Pulse: (!) 101 100 (!) 103 100  Resp: (!) 22 (!) 25 18 18   Temp:    98.9 F (37.2 C)  TempSrc:    Oral  SpO2: 93% 92% 90% 95%  Weight:    85.3 kg  Height:    4\' 11"  (1.499 m)      Constitutional: Moderately built  and nourished. Vitals:   02/17/18 0030 02/17/18 0200 02/17/18 0319 02/17/18 0415  BP: 129/73 136/79 126/74 (!) 125/58  Pulse: (!) 101 100 (!) 103 100  Resp: (!) 22 (!) 25 18 18   Temp:    98.9 F (37.2 C)  TempSrc:    Oral  SpO2: 93% 92% 90% 95%  Weight:    85.3 kg  Height:    4\' 11"  (1.499 m)   Eyes: Anicteric no pallor. ENMT: No discharge from the ears eyes nose or mouth. Neck: No mass or.  No JVD appreciated. Respiratory: No rhonchi or crepitations. Cardiovascular: S1-S2 heard no murmurs appreciated. Abdomen: Soft nontender bowel sounds present. Musculoskeletal: Bilateral lower extremity edema present. Skin: No rash. Neurologic: Alert awake oriented to time place and person.  Moves all extremities. Psychiatric:  Appears normal.  Normal affect.   Labs on Admission: I have personally reviewed following labs and imaging studies  CBC: Recent Labs  Lab 02/16/18 1825  WBC 18.5*  NEUTROABS 8.3*  HGB 5.8*  HCT 16.9*  MCV 110.5*  PLT 293   Basic Metabolic Panel: Recent Labs  Lab 02/16/18 1825  NA 133*  K 5.0  CL 107  CO2 21*  GLUCOSE 91  BUN 14  CREATININE 0.83  CALCIUM 8.2*   GFR: Estimated Creatinine Clearance: 93.9 mL/min (by C-G formula based on SCr of 0.83 mg/dL). Liver Function Tests: Recent Labs  Lab 02/16/18 1825  AST 104*  ALT 83*  ALKPHOS 131*  BILITOT 11.0*  PROT 9.0*  ALBUMIN 3.4*   No results for input(s): LIPASE, AMYLASE in the last 168 hours. No results for input(s): AMMONIA in the last 168 hours. Coagulation Profile: No results for input(s): INR, PROTIME in the last 168 hours. Cardiac Enzymes: Recent Labs  Lab 02/16/18 1825  TROPONINI <0.03   BNP (last 3 results) No results for input(s): PROBNP in the last 8760 hours. HbA1C: No results for input(s): HGBA1C in the last 72 hours. CBG: No results for input(s): GLUCAP in the last 168 hours. Lipid Profile: No results for input(s): CHOL, HDL, LDLCALC, TRIG, CHOLHDL, LDLDIRECT in the last 72 hours. Thyroid Function Tests: No results for input(s): TSH, T4TOTAL, FREET4, T3FREE, THYROIDAB in the last 72 hours. Anemia Panel: Recent Labs    02/16/18 1825  RETICCTPCT 16.3*   Urine analysis:    Component Value Date/Time   COLORURINE BROWN (A) 02/16/2018 2100   APPEARANCEUR CLEAR 02/16/2018 2100   LABSPEC 1.020 02/16/2018 2100   PHURINE 5.5 02/16/2018 2100   GLUCOSEU NEGATIVE 02/16/2018 2100   HGBUR MODERATE (A) 02/16/2018 2100   BILIRUBINUR MODERATE (A) 02/16/2018 2100   KETONESUR NEGATIVE 02/16/2018 2100   PROTEINUR 30 (A) 02/16/2018 2100   UROBILINOGEN 0.2 05/15/2013 2130   NITRITE NEGATIVE 02/16/2018 2100   LEUKOCYTESUR NEGATIVE 02/16/2018 2100   Sepsis  Labs: @LABRCNTIP (procalcitonin:4,lacticidven:4) )No results found for this or any previous visit (from the past 240 hour(s)).   Radiological Exams on Admission: Dg Chest 2 View  Result Date: 02/16/2018 CLINICAL DATA:  Dry cough and shortness of breath. EXAM: CHEST - 2 VIEW COMPARISON:  12/01/2017 FINDINGS: The right IJ power port appears stable. The heart is borderline enlarged but unchanged. Mild central vascular congestion but no overt pulmonary edema, pleural effusions or focal infiltrates. IMPRESSION: Stable mild cardiac enlargement with central vascular congestion but no overt pulmonary edema or pleural effusions. Electronically Signed   By: Rudie Meyer M.D.   On: 02/16/2018 18:51      Assessment/Plan Principal Problem:  Acute respiratory failure with hypoxia (HCC) Active Problems:   Sickle cell anemia (HCC)   Dyspnea   Essential hypertension    1. Acute respiratory failure with hypoxia likely a combination of viral respiratory tract symptoms with possible fluid overload concerning for CHF.  Anemia could also be contributing.  Patient has had a 2D echo done in September 2017 which showed EF of 55 to 60%.  Given the good response to Lasix we will recheck 2D echo.  Repeat labs are pending.  Will check respiratory viral panel and influenza PCR for the upper respiratory symptoms.  Dopplers of the lower extremities.  Check d-dimer. 2. Sickle cell anemia hemoglobin is decreased from baseline.  Will hold hydroxyurea repeat CBC type and screen.  See #1. 3. Hypertension on metoprolol.  Addendum -EKG showed ST elevation in inferior leads with reciprocal changes.  Discussed with cardiologist Dr. Jacinto Halim.  Initially code STEMI was activated but another repeat one showed normalization of the ST elevation.  Dr. Jacinto Halim advised to transfer patient to [facility] and will be seeing patient in consult.   DVT prophylaxis: Lovenox. Code Status: Full code. Family Communication: Discussed with  patient. Disposition Plan: Home. Consults called: None. Admission status: Observation.   Eduard Clos MD [facility] Pager [phone].  If 7PM-7AM, please contact night-coverage [email or web address] Password [email or web address]  02/17/2018, 5:40 AM

## 2018-02-17 NOTE — Progress Notes (Signed)
Patient ID: Pamela Hudson Carreto, female   DOB: [birth year], 32 y.o.   MRN: [id number]  Pamela Hudson Pamela Hudson is a 32 y.o. female with history of sickle cell anemia, hypertension presents to the ER at [facility] with complaints of increasing shortness of breath and nasal congestion ongoing for last 1 week.  In the ER, chest x-ray shows congestion, bilateral lower extremities edema on examination for which lower extremity ultrasound was ordered which ruled out DVT, initial EKGs showed possibility of STEMI, troponins were negative, patient reported no chest pain but had cough and shortness of breath.  Respiratory panel is positive for influenza B.  Patient is currently on droplet precautions, heparin drip per protocol for NSTEMI as well as other adjunct therapy. On my review of patient, I doubt NSTEMI, hemoglobin is very low at 5.2 as against a baseline of above 7, patient also has influenza B infection which will explain her cough, congestion and shortness of breath.  More importantly EKG repeated shortly after the first series showed sinus tachycardia with no major ST-T changes  - We will discontinue heparin drip,  - Transfuse patient with 2 units of packed red blood cell,  - Continue oseltamivir,  - Continue other home medications.  - Monitor vital signs closely. Labs in a.m.  Jeanann Lewandowskylugbemiga Alazay Leicht, MD  02/17/18  12:47 pm

## 2018-02-18 LAB — COMPREHENSIVE METABOLIC PANEL
ALT: 70 U/L — ABNORMAL HIGH (ref 0–44)
ALT: 68 U/L — ABNORMAL HIGH (ref 0–44)
AST: 93 U/L — ABNORMAL HIGH (ref 15–41)
AST: 86 U/L — ABNORMAL HIGH (ref 15–41)
Albumin: 3.4 g/dL — ABNORMAL LOW (ref 3.5–5.0)
Albumin: 3.4 g/dL — ABNORMAL LOW (ref 3.5–5.0)
Alkaline Phosphatase: 139 U/L — ABNORMAL HIGH (ref 38–126)
Alkaline Phosphatase: 134 U/L — ABNORMAL HIGH (ref 38–126)
Anion gap: 7 (ref 5–15)
Anion gap: 6 (ref 5–15)
BUN: 12 mg/dL (ref 6–20)
BUN: 10 mg/dL (ref 6–20)
CO2: 23 mmol/L (ref 22–32)
CO2: 22 mmol/L (ref 22–32)
Calcium: 8.4 mg/dL — ABNORMAL LOW (ref 8.9–10.3)
Calcium: 8.4 mg/dL — ABNORMAL LOW (ref 8.9–10.3)
Chloride: 105 mmol/L (ref 98–111)
Chloride: 107 mmol/L (ref 98–111)
Creatinine, Ser: 0.63 mg/dL (ref 0.44–1.00)
Creatinine, Ser: 0.59 mg/dL (ref 0.44–1.00)
GFR calc Af Amer: >60 mL/min (ref >60)
GFR calc Af Amer: >60 mL/min (ref >60)
GFR calc non Af Amer: >60 mL/min (ref >60)
Glucose, Bld: 111 mg/dL — ABNORMAL HIGH (ref 70–99)
Glucose, Bld: 138 mg/dL — ABNORMAL HIGH (ref 70–99)
Potassium: 4.9 mmol/L (ref 3.5–5.1)
Potassium: 5 mmol/L (ref 3.5–5.1)
Sodium: 135 mmol/L (ref 135–145)
Sodium: 135 mmol/L (ref 135–145)
Total Bilirubin: 8.7 mg/dL — ABNORMAL HIGH (ref 0.3–1.2)
Total Bilirubin: 8.4 mg/dL — ABNORMAL HIGH (ref 0.3–1.2)
Total Protein: 8.9 g/dL — ABNORMAL HIGH (ref 6.5–8.1)
Total Protein: 8.7 g/dL — ABNORMAL HIGH (ref 6.5–8.1)

## 2018-02-18 LAB — CBC WITH DIFFERENTIAL/PLATELET
Abs Immature Granulocytes: 0.08 10*3/uL — ABNORMAL HIGH (ref 0.00–0.07)
BASOS PCT: 1 %
Basophils Absolute: 0.2 10*3/uL — ABNORMAL HIGH (ref 0.0–0.1)
Eosinophils Absolute: 0.3 10*3/uL (ref 0.0–0.5)
Eosinophils Relative: 2 %
HCT: 21.8 % — ABNORMAL LOW (ref 36.0–46.0)
HEMOGLOBIN: 7.6 g/dL — AB (ref 12.0–15.0)
Immature Granulocytes: 1 %
Lymphocytes Relative: 47 %
Lymphs Abs: 7.4 10*3/uL — ABNORMAL HIGH (ref 0.7–4.0)
MCH: 34.9 pg — ABNORMAL HIGH (ref 26.0–34.0)
MCHC: 34.9 g/dL (ref 30.0–36.0)
MCV: 100 fL (ref 80.0–100.0)
Monocytes Absolute: 1.7 10*3/uL — ABNORMAL HIGH (ref 0.1–1.0)
Monocytes Relative: 11 %
Neutro Abs: 6 10*3/uL (ref 1.7–7.7)
Neutrophils Relative %: 38 %
Platelets: 247 10*3/uL (ref 150–400)
RBC: 2.18 MIL/uL — ABNORMAL LOW (ref 3.87–5.11)
RDW: 24.2 % — ABNORMAL HIGH (ref 11.5–15.5)
WBC: 15.7 10*3/uL — ABNORMAL HIGH (ref 4.0–10.5)
nRBC: 4.6 % — ABNORMAL HIGH (ref 0.0–0.2)

## 2018-02-18 LAB — TROPONIN I: Troponin I: <0.03 ng/mL (ref <0.03)

## 2018-02-18 LAB — TSH: TSH: 5.241 u[IU]/mL — ABNORMAL HIGH (ref 0.350–4.500)

## 2018-02-18 NOTE — Progress Notes (Signed)
From cardiac standpoint stable, continue Maxzide for now for edema. Discontinue on discharge. CHF, diastolic HF due to high out put failure due to severe anemia.   Please consider hematology consult. Please review my prior notes. I will see her in a month for F/U  Yates DecampJay Mozell Haber, MD 02/18/2018, 10:22 AM [facility] [place] Pager: [phone] Office: [phone] If no answer: Cell:  [phone]

## 2018-02-18 NOTE — Progress Notes (Signed)
Subjective: Patient admitted with acute diastolic heart failure.  Hemoglobin was 5.8.  She has been transfused 2 units of packed red blood cells on the current hemoglobin is 7.6.  No fever or chills no nausea vomiting no diarrhea.  Patient overall feels better and doing much better.  Still has some lower extremity edema about 1+.  Objective: Vital signs in last 24 hours: Temp:  [98.1 F (36.7 C)-99 F (37.2 C)] 98.3 F (36.8 C) (12/07 1508) Pulse Rate:  [88-99] 90 (12/07 1508) Resp:  [16-20] 20 (12/07 1508) BP: (123-140)/(51-88) 127/78 (12/07 1508) SpO2:  [91 %-100 %] 99 % (12/07 1508) Weight:  [80.8 kg] 80.8 kg (12/07 0500) Weight change: -6.305 kg Last BM Date: 02/17/18  Intake/Output from previous day: 12/06 0701 - 12/07 0700 In: 1453.7 [P.O.:360; I.V.:65.7; Blood:693] Out: -  Intake/Output this shift: No intake/output data recorded.  General appearance: alert, cooperative, appears stated age and no distress Head: Normocephalic, without obvious abnormality, atraumatic Eyes: conjunctivae/corneas clear. PERRL, EOM's intact. Fundi benign. Neck: no adenopathy, no carotid bruit, no JVD, supple, symmetrical, trachea midline and thyroid not enlarged, symmetric, no tenderness/mass/nodules Back: symmetric, no curvature. ROM normal. No CVA tenderness. Resp: clear to auscultation bilaterally Cardio: regular rate and rhythm, S1, S2 normal, no murmur, click, rub or gallop GI: soft, non-tender; bowel sounds normal; no masses,  no organomegaly Skin: Skin color, texture, turgor normal. No rashes or lesions Neurologic: Grossly normal  Lab Results: Recent Labs    02/17/18 0717 02/18/18 1237  WBC 15.7* 15.7*  HGB 5.8* 7.6*  HCT 16.7* 21.8*  PLT 286 247   BMET Recent Labs    02/18/18 0435 02/18/18 1237  NA 135 135  K 4.9 5.0  CL 105 107  CO2 23 22  GLUCOSE 111* 138*  BUN 12 10  CREATININE 0.63 0.59  CALCIUM 8.4* 8.4*    Studies/Results: Dg Chest 2 View  Result Date:  02/16/2018 CLINICAL DATA:  Dry cough and shortness of breath. EXAM: CHEST - 2 VIEW COMPARISON:  12/01/2017 FINDINGS: The right IJ power port appears stable. The heart is borderline enlarged but unchanged. Mild central vascular congestion but no overt pulmonary edema, pleural effusions or focal infiltrates. IMPRESSION: Stable mild cardiac enlargement with central vascular congestion but no overt pulmonary edema or pleural effusions. Electronically Signed   By: Rudie Meyer M.D.   On: 02/16/2018 18:51   Vas [place] Lower Extremity Venous (dvt)  Result Date: 02/17/2018  Lower Venous Study Indications: Swelling, and sickle cell anemia.  Performing Technologist: Farrel Demark RDMS, RVT  Examination Guidelines: A complete evaluation includes B-mode imaging, spectral Doppler, color Doppler, and power Doppler as needed of all accessible portions of each vessel. Bilateral testing is considered an integral part of a complete examination. Limited examinations for reoccurring indications may be performed as noted.  Right Venous Findings: +---------+---------------+---------+-----------+----------+--------------+          CompressibilityPhasicitySpontaneityPropertiesSummary        +---------+---------------+---------+-----------+----------+--------------+ CFV      Full           Yes      Yes                  pulsatile flow +---------+---------------+---------+-----------+----------+--------------+ SFJ      Full                                                        +---------+---------------+---------+-----------+----------+--------------+  FV Prox  Full           Yes      Yes                                 +---------+---------------+---------+-----------+----------+--------------+ FV Mid   Full                                                        +---------+---------------+---------+-----------+----------+--------------+ FV Distal               Yes      Yes                                  +---------+---------------+---------+-----------+----------+--------------+ PFV      Full                                                        +---------+---------------+---------+-----------+----------+--------------+ POP      Full           Yes      Yes                  pulsatile flow +---------+---------------+---------+-----------+----------+--------------+ PTV      Full                                                        +---------+---------------+---------+-----------+----------+--------------+ PERO     Full                                                        +---------+---------------+---------+-----------+----------+--------------+  Left Venous Findings: +---------+---------------+---------+-----------+----------+--------------+          CompressibilityPhasicitySpontaneityPropertiesSummary        +---------+---------------+---------+-----------+----------+--------------+ CFV      Full           Yes      Yes                  pulsatile flow +---------+---------------+---------+-----------+----------+--------------+ SFJ      Full                                                        +---------+---------------+---------+-----------+----------+--------------+ FV Prox  Full           Yes      Yes                                 +---------+---------------+---------+-----------+----------+--------------+ FV Mid   Full                                                        +---------+---------------+---------+-----------+----------+--------------+  FV Distal               Yes      Yes                                 +---------+---------------+---------+-----------+----------+--------------+ PFV      Full                                                        +---------+---------------+---------+-----------+----------+--------------+ POP      Full           Yes      Yes                  pulsatile flow  +---------+---------------+---------+-----------+----------+--------------+ PTV      Full                                                        +---------+---------------+---------+-----------+----------+--------------+ PERO     Full                                                        +---------+---------------+---------+-----------+----------+--------------+    Summary: Right: There is no evidence of deep vein thrombosis in the lower extremity. No cystic structure found in the popliteal fossa. Pulsatile venous flow suggestive of increased right side heart pressure. Left: There is no evidence of deep vein thrombosis in the lower extremity. No cystic structure found in the popliteal fossa. Pulsatile venous flow suggestive of increased right side heart pressure.  *See table(s) above for measurements and observations.    Preliminary     Medications: I have reviewed the patient's current medications.  Assessment/Plan: A 32 year old female admitted with sickle cell crisis but heart failure secondary to demand from severe anemia.  #1 acute diastolic heart failure: Patient has improved with blood transfusion.  Hemoglobin is up to 7.9 today.  She is breathing better but still has residual edema.  Continue to mobilize patient and check hemoglobin in the morning.  If it appears to be stable patient may be discharged at that point.  #2 sickle cell anemia: H&H is stable up to 7.9 today.  Recheck in the morning.  #3 sickle cell crisis: Not in significant pain.  Continue empiric pain medications.  #4 hypertension: Continue metoprolol and Maxzide.  #5 leukocytosis: Secondary to vaso-occlusive crisis.  Continue to monitor white count.   LOS: 1 day   GARBA,LAWAL 02/18/2018, 3:38 PM

## 2018-02-19 LAB — TYPE AND SCREEN
ABO/RH(D): B POS
Antibody Screen: NEGATIVE
Donor AG Type: NEGATIVE
Donor AG Type: NEGATIVE
Unit division: 0
Unit division: 0

## 2018-02-19 LAB — COMPREHENSIVE METABOLIC PANEL
ALBUMIN: 3.3 g/dL — AB (ref 3.5–5.0)
ALT: 67 U/L — ABNORMAL HIGH (ref 0–44)
AST: 87 U/L — ABNORMAL HIGH (ref 15–41)
Alkaline Phosphatase: 143 U/L — ABNORMAL HIGH (ref 38–126)
Anion gap: 6 (ref 5–15)
BUN: 11 mg/dL (ref 6–20)
CHLORIDE: 107 mmol/L (ref 98–111)
CO2: 22 mmol/L (ref 22–32)
Calcium: 8.4 mg/dL — ABNORMAL LOW (ref 8.9–10.3)
Creatinine, Ser: 0.62 mg/dL (ref 0.44–1.00)
GFR calc Af Amer: >60 mL/min (ref >60)
GFR calc non Af Amer: >60 mL/min (ref >60)
GLUCOSE: 138 mg/dL — AB (ref 70–99)
Potassium: 5.1 mmol/L (ref 3.5–5.1)
Sodium: 135 mmol/L (ref 135–145)
Total Bilirubin: 7.2 mg/dL — ABNORMAL HIGH (ref 0.3–1.2)
Total Protein: 8.7 g/dL — ABNORMAL HIGH (ref 6.5–8.1)

## 2018-02-19 LAB — CBC WITH DIFFERENTIAL/PLATELET
Abs Immature Granulocytes: 0.06 10*3/uL (ref 0.00–0.07)
Basophils Absolute: 0.1 10*3/uL (ref 0.0–0.1)
Basophils Relative: 1 %
Eosinophils Absolute: 0.4 10*3/uL (ref 0.0–0.5)
Eosinophils Relative: 3 %
HCT: 22.4 % — ABNORMAL LOW (ref 36.0–46.0)
Hemoglobin: 7.8 g/dL — ABNORMAL LOW (ref 12.0–15.0)
Immature Granulocytes: 0 %
Lymphocytes Relative: 50 %
Lymphs Abs: 7.7 10*3/uL — ABNORMAL HIGH (ref 0.7–4.0)
MCH: 35 pg — ABNORMAL HIGH (ref 26.0–34.0)
MCHC: 34.8 g/dL (ref 30.0–36.0)
MCV: 100.4 fL — ABNORMAL HIGH (ref 80.0–100.0)
MONOS PCT: 9 %
Monocytes Absolute: 1.4 10*3/uL — ABNORMAL HIGH (ref 0.1–1.0)
Neutro Abs: 5.7 10*3/uL (ref 1.7–7.7)
Neutrophils Relative %: 37 %
Platelets: 227 10*3/uL (ref 150–400)
RBC: 2.23 MIL/uL — ABNORMAL LOW (ref 3.87–5.11)
RDW: 23.2 % — ABNORMAL HIGH (ref 11.5–15.5)
WBC: 15.4 10*3/uL — ABNORMAL HIGH (ref 4.0–10.5)
nRBC: 3.5 % — ABNORMAL HIGH (ref 0.0–0.2)

## 2018-02-19 LAB — BPAM RBC
Blood Product Expiration Date: 201912132359
Blood Product Expiration Date: 201912272359
ISSUE DATE / TIME: 201912062240
ISSUE DATE / TIME: 201912061609
UNIT TYPE AND RH: 9500
Unit Type and Rh: 5100

## 2018-02-19 MED ORDER — HEPARIN SOD (PORK) LOCK FLUSH 100 UNIT/ML IV SOLN
500.0000 [IU] | INTRAVENOUS | Status: AC | PRN
  Administered 2018-02-19: 500 [IU]

## 2018-02-19 MED ORDER — TRIAMTERENE-HCTZ 37.5-25 MG PO TABS: 1.0000 | ORAL_TABLET | Freq: Every day | ORAL | 0 refills | Status: DC

## 2018-02-19 MED ORDER — OSELTAMIVIR PHOSPHATE 75 MG PO CAPS: 75.0000 mg | ORAL_CAPSULE | Freq: Two times a day (BID) | ORAL | 0 refills | Status: DC

## 2018-02-19 NOTE — Progress Notes (Signed)
Pamela Hudson to be D/C'd Home per MD order.  Discussed prescriptions and follow up appointments with the patient. Prescriptions given to patient, medication list explained in detail. Pt verbalized understanding.  Allergies as of 02/19/2018      Reactions   Percocet [oxycodone-acetaminophen] Swelling   Swelling of the lip.      Medication List    TAKE these medications   folic acid 1 MG tablet Commonly known as:  FOLVITE Take 1 mg by mouth daily.   hydroxyurea 500 MG capsule Commonly known as:  HYDREA Take 1,000 mg by mouth daily.   ibuprofen 200 MG tablet Commonly known as:  ADVIL,MOTRIN Take 3 tablets (600 mg total) by mouth every 6 (six) hours as needed for mild pain or moderate pain.   JADENU 360 MG Tabs Generic drug:  Deferasirox Take 720 mg by mouth daily.   magnesium oxide 400 MG tablet Commonly known as:  MAG-OX Take 400 mg by mouth daily.   metoprolol succinate 25 MG 24 hr tablet Commonly known as:  TOPROL-XL Take 0.5 tablets (12.5 mg total) by mouth daily.   oseltamivir 75 MG capsule Commonly known as:  TAMIFLU Take 1 capsule (75 mg total) by mouth 2 (two) times daily.   triamterene-hydrochlorothiazide 37.5-25 MG tablet Commonly known as:  MAXZIDE-25 Take 1 tablet by mouth daily. Start taking on:  02/20/2018   Vitamin D (Ergocalciferol) 1.25 MG (50000 UT) Caps capsule Commonly known as:  DRISDOL Take 2 capsules by mouth every 7 (seven) days.       Vitals:   02/18/18 2053 02/19/18 0545  BP: 133/77 127/61  Pulse: 94 83  Resp: 19 17  Temp: 98 F (36.7 C) 98.4 F (36.9 C)  SpO2: 97% 98%    Skin clean, dry and intact without evidence of skin break down, no evidence of skin tears noted. IV catheter discontinued intact. Site without signs and symptoms of complications. Dressing and pressure applied. Pt denies pain at this time. No complaints noted.  An After Visit Summary was printed and given to the patient. Patient escorted via WC, and D/C home via  private auto.  Mariann BarterKellie Marquail Bradwell BSN, RN [employer] [facility] Phone [phone]

## 2018-02-19 NOTE — Discharge Summary (Signed)
Physician Discharge Summary  Patient ID: Pamela Hudson MRN: [id number] DOB/AGE: 07-19-1985 32 y.o.  Admit date: 02/16/2018 Discharge date: 02/19/2018  Admission Diagnoses:  Discharge Diagnoses:  Principal Problem:   Acute respiratory failure with hypoxia (HCC) Active Problems:   Sickle cell anemia (HCC)   Dyspnea   Essential hypertension   Acute on chronic anemia   Discharged Condition: good  Hospital Course: Patient was admitted with acute respiratory failure hypoxemia and fluid overload.  She was diagnosed with acute diastolic heart failure with possible non-ST elevation MI due to demand ischemia.  She had very low hemoglobin chronically leading to the congestive heart failure it appears.  She has mild EKG changes and troponin elevation.  Cardiology was consulted and Dr. Jacinto Halim saw the patient.  Recommended blood transfusion to correct her anemia and Maxide daily.  She had IV diuretics in the hospital prior to transfusion.  Since then however she has improved and does not require continued Lasix.  Patient also had sickle cell crisis which was treated appropriately.  She had evidence of influenza and has been on Tamiflu.  She will be discharged home on Tamiflu to complete treatment.  Patient also has hypomagnesemia which is equally corrected.  At this point she is pain-free and feeling better.  She has high blood pressure on metoprolol.  She will continue with home regimen with additional Maxide.  Follow-up with her primary care physician.  Consults: cardiology  Significant Diagnostic Studies: labs: CBC and CMP's were checked and patient was transfused 2 units of packed red blood cells when hemoglobin was as low as 5.6  Treatments: IV hydration, antibiotics: Tamiflu, analgesia: acetaminophen and Dilaudid and cardiac meds: furosemide  Discharge Exam: Blood pressure 127/61, pulse 83, temperature 98.4 F (36.9 C), temperature source Oral, resp. rate 17, height 4\' 11"  (1.499 m), weight 80.9  kg, SpO2 98 %. General appearance: alert, cooperative, appears stated age and no distress Head: Normocephalic, without obvious abnormality, atraumatic Eyes: conjunctivae/corneas clear. PERRL, EOM's intact. Fundi benign. Back: symmetric, no curvature. ROM normal. No CVA tenderness. Resp: clear to auscultation bilaterally Cardio: regular rate and rhythm, S1, S2 normal, no murmur, click, rub or gallop GI: soft, non-tender; bowel sounds normal; no masses,  no organomegaly Extremities: extremities normal, atraumatic, no cyanosis or edema Pulses: 2+ and symmetric Skin: Skin color, texture, turgor normal. No rashes or lesions Neurologic: Grossly normal  Disposition: Discharge disposition: 01-Home or Self Care       Discharge Instructions    Diet - low sodium heart healthy   Complete by:  As directed    Increase activity slowly   Complete by:  As directed      Allergies as of 02/19/2018      Reactions   Percocet [oxycodone-acetaminophen] Swelling   Swelling of the lip.      Medication List    TAKE these medications   folic acid 1 MG tablet Commonly known as:  FOLVITE Take 1 mg by mouth daily.   hydroxyurea 500 MG capsule Commonly known as:  HYDREA Take 1,000 mg by mouth daily.   ibuprofen 200 MG tablet Commonly known as:  ADVIL,MOTRIN Take 3 tablets (600 mg total) by mouth every 6 (six) hours as needed for mild pain or moderate pain.   JADENU 360 MG Tabs Generic drug:  Deferasirox Take 720 mg by mouth daily.   magnesium oxide 400 MG tablet Commonly known as:  MAG-OX Take 400 mg by mouth daily.   metoprolol succinate 25 MG 24 hr tablet Commonly  known as:  TOPROL-XL Take 0.5 tablets (12.5 mg total) by mouth daily.   oseltamivir 75 MG capsule Commonly known as:  TAMIFLU Take 1 capsule (75 mg total) by mouth 2 (two) times daily.   triamterene-hydrochlorothiazide 37.5-25 MG tablet Commonly known as:  MAXZIDE-25 Take 1 tablet by mouth daily. Start taking on:   02/20/2018   Vitamin D (Ergocalciferol) 1.25 MG (50000 UT) Caps capsule Commonly known as:  DRISDOL Take 2 capsules by mouth every 7 (seven) days.      Follow-up Information    Yates DecampGanji, Jay, MD. Call in 2 day(s).   Specialty:  Cardiology Why:  To have cardiac f/u in 1 month. Call after discharge to set up appointment Contact information: [address] [place] [address] [phone]           Signed: Lonia BloodGARBA,LAWAL 02/19/2018, 1:18 PM   Time spent 37 minutes

## 2018-02-20 LAB — PATHOLOGIST SMEAR REVIEW

## 2018-02-21 LAB — CULTURE, BLOOD (ROUTINE X 2)
CULTURE: NO GROWTH
Culture: NO GROWTH
Special Requests: ADEQUATE
Special Requests: ADEQUATE

## 2018-03-24 ENCOUNTER — Encounter (HOSPITAL_BASED_OUTPATIENT_CLINIC_OR_DEPARTMENT_OTHER): Payer: Self-pay | Admitting: *Deleted

## 2018-03-24 ENCOUNTER — Other Ambulatory Visit: Payer: Self-pay

## 2018-03-24 ENCOUNTER — Observation Stay (HOSPITAL_BASED_OUTPATIENT_CLINIC_OR_DEPARTMENT_OTHER)
Admission: EM | Admit: 2018-03-24 | Discharge: 2018-03-25 | Disposition: A | Discharge status: Home / Self Care | Payer: Medicaid Other | Attending: Internal Medicine | Admitting: Internal Medicine

## 2018-03-24 DIAGNOSIS — E875 Hyperkalemia: Secondary | ICD-10-CM | POA: Diagnosis present

## 2018-03-24 DIAGNOSIS — I10 Essential (primary) hypertension: Secondary | ICD-10-CM | POA: Diagnosis not present

## 2018-03-24 DIAGNOSIS — Z87891 Personal history of nicotine dependence: Secondary | ICD-10-CM | POA: Insufficient documentation

## 2018-03-24 DIAGNOSIS — Z79899 Other long term (current) drug therapy: Secondary | ICD-10-CM | POA: Diagnosis not present

## 2018-03-24 DIAGNOSIS — Z8673 Personal history of transient ischemic attack (TIA), and cerebral infarction without residual deficits: Secondary | ICD-10-CM | POA: Insufficient documentation

## 2018-03-24 DIAGNOSIS — E282 Polycystic ovarian syndrome: Secondary | ICD-10-CM | POA: Insufficient documentation

## 2018-03-24 DIAGNOSIS — G8929 Other chronic pain: Secondary | ICD-10-CM | POA: Insufficient documentation

## 2018-03-24 DIAGNOSIS — Z791 Long term (current) use of non-steroidal anti-inflammatories (NSAID): Secondary | ICD-10-CM | POA: Insufficient documentation

## 2018-03-24 DIAGNOSIS — D571 Sickle-cell disease without crisis: Secondary | ICD-10-CM | POA: Diagnosis not present

## 2018-03-24 LAB — POTASSIUM: Potassium: 6.6 mmol/L (ref 3.5–5.1)

## 2018-03-24 MED ORDER — INSULIN ASPART 100 UNIT/ML IV SOLN
5.0000 [IU] | Freq: Once | INTRAVENOUS | Status: AC
  Administered 2018-03-25: 5 [IU] via INTRAVENOUS
  Filled 2018-03-24: qty 1

## 2018-03-24 MED ORDER — SODIUM BICARBONATE 8.4 % IV SOLN
50.0000 meq | Freq: Once | INTRAVENOUS | Status: AC
  Administered 2018-03-25: 50 meq via INTRAVENOUS
  Filled 2018-03-24: qty 50

## 2018-03-24 MED ORDER — SODIUM CHLORIDE 0.9 % IV BOLUS
1000.0000 mL | Freq: Once | INTRAVENOUS | Status: AC
  Administered 2018-03-25: 1000 mL via INTRAVENOUS

## 2018-03-24 MED ORDER — DEXTROSE 50 % IV SOLN
1.0000 | Freq: Once | INTRAVENOUS | Status: AC
  Administered 2018-03-25: 50 mL via INTRAVENOUS
  Filled 2018-03-24: qty 50

## 2018-03-24 MED ORDER — SODIUM ZIRCONIUM CYCLOSILICATE 10 G PO PACK
10.0000 g | PACK | Freq: Once | ORAL | Status: AC
  Administered 2018-03-25: 10 g via ORAL
  Filled 2018-03-24: qty 1

## 2018-03-24 NOTE — ED Triage Notes (Signed)
She had lab work today. Her MD called her and told her the K+ was 6.7 and she needed to come to the ED.

## 2018-03-25 ENCOUNTER — Other Ambulatory Visit: Payer: Self-pay

## 2018-03-25 DIAGNOSIS — E875 Hyperkalemia: Secondary | ICD-10-CM | POA: Diagnosis present

## 2018-03-25 DIAGNOSIS — G8929 Other chronic pain: Secondary | ICD-10-CM | POA: Diagnosis not present

## 2018-03-25 DIAGNOSIS — Z79899 Other long term (current) drug therapy: Secondary | ICD-10-CM | POA: Diagnosis not present

## 2018-03-25 DIAGNOSIS — Z791 Long term (current) use of non-steroidal anti-inflammatories (NSAID): Secondary | ICD-10-CM | POA: Diagnosis not present

## 2018-03-25 DIAGNOSIS — D571 Sickle-cell disease without crisis: Secondary | ICD-10-CM

## 2018-03-25 DIAGNOSIS — I10 Essential (primary) hypertension: Secondary | ICD-10-CM

## 2018-03-25 DIAGNOSIS — Z8673 Personal history of transient ischemic attack (TIA), and cerebral infarction without residual deficits: Secondary | ICD-10-CM | POA: Diagnosis not present

## 2018-03-25 DIAGNOSIS — E282 Polycystic ovarian syndrome: Secondary | ICD-10-CM | POA: Diagnosis not present

## 2018-03-25 DIAGNOSIS — Z87891 Personal history of nicotine dependence: Secondary | ICD-10-CM | POA: Diagnosis not present

## 2018-03-25 LAB — BASIC METABOLIC PANEL
Anion gap: 6 (ref 5–15)
Anion gap: 8 (ref 5–15)
BUN: 22 mg/dL — ABNORMAL HIGH (ref 6–20)
BUN: 17 mg/dL (ref 6–20)
CALCIUM: 8.8 mg/dL — AB (ref 8.9–10.3)
CHLORIDE: 109 mmol/L (ref 98–111)
CO2: 21 mmol/L — ABNORMAL LOW (ref 22–32)
CO2: 19 mmol/L — ABNORMAL LOW (ref 22–32)
Calcium: 8.4 mg/dL — ABNORMAL LOW (ref 8.9–10.3)
Chloride: 111 mmol/L (ref 98–111)
Creatinine, Ser: 1.01 mg/dL — ABNORMAL HIGH (ref 0.44–1.00)
Creatinine, Ser: 0.85 mg/dL (ref 0.44–1.00)
GFR calc Af Amer: >60 mL/min (ref >60)
GFR calc Af Amer: >60 mL/min (ref >60)
GFR calc non Af Amer: >60 mL/min (ref >60)
GFR calc non Af Amer: >60 mL/min (ref >60)
Glucose, Bld: 127 mg/dL — ABNORMAL HIGH (ref 70–99)
Glucose, Bld: 110 mg/dL — ABNORMAL HIGH (ref 70–99)
POTASSIUM: 5.2 mmol/L — AB (ref 3.5–5.1)
Potassium: 5.1 mmol/L (ref 3.5–5.1)
SODIUM: 136 mmol/L (ref 135–145)
SODIUM: 138 mmol/L (ref 135–145)

## 2018-03-25 LAB — URINALYSIS, ROUTINE W REFLEX MICROSCOPIC
Bacteria, UA: NONE SEEN
Bilirubin Urine: NEGATIVE
Glucose, UA: NEGATIVE mg/dL
Ketones, ur: NEGATIVE mg/dL
Leukocytes, UA: NEGATIVE
Nitrite: NEGATIVE
Protein, ur: NEGATIVE mg/dL
Specific Gravity, Urine: 1.01 (ref 1.005–1.030)
pH: 6 (ref 5.0–8.0)

## 2018-03-25 LAB — CBC WITH DIFFERENTIAL/PLATELET
Abs Immature Granulocytes: 0.16 10*3/uL — ABNORMAL HIGH (ref 0.00–0.07)
Basophils Absolute: 0.1 10*3/uL (ref 0.0–0.1)
Basophils Relative: 0 %
Eosinophils Absolute: 0.9 10*3/uL — ABNORMAL HIGH (ref 0.0–0.5)
Eosinophils Relative: 3 %
HCT: 16.5 % — ABNORMAL LOW (ref 36.0–46.0)
HEMOGLOBIN: 5.7 g/dL — AB (ref 12.0–15.0)
Immature Granulocytes: 1 %
Lymphocytes Relative: 29 %
Lymphs Abs: 7.6 10*3/uL — ABNORMAL HIGH (ref 0.7–4.0)
MCH: 33.9 pg (ref 26.0–34.0)
MCHC: 34.5 g/dL (ref 30.0–36.0)
MCV: 98.2 fL (ref 80.0–100.0)
Monocytes Absolute: 2.5 10*3/uL — ABNORMAL HIGH (ref 0.1–1.0)
Monocytes Relative: 9 %
NEUTROS ABS: 15.5 10*3/uL — AB (ref 1.7–7.7)
Neutrophils Relative %: 58 %
Platelets: 360 10*3/uL (ref 150–400)
RBC: 1.68 MIL/uL — ABNORMAL LOW (ref 3.87–5.11)
Smear Review: NORMAL
WBC: 26.7 10*3/uL — ABNORMAL HIGH (ref 4.0–10.5)
nRBC: 0.3 % — ABNORMAL HIGH (ref 0.0–0.2)

## 2018-03-25 LAB — COMPREHENSIVE METABOLIC PANEL
ALBUMIN: 3.4 g/dL — AB (ref 3.5–5.0)
ALT: 98 U/L — ABNORMAL HIGH (ref 0–44)
AST: 124 U/L — ABNORMAL HIGH (ref 15–41)
Alkaline Phosphatase: 146 U/L — ABNORMAL HIGH (ref 38–126)
Anion gap: 6 (ref 5–15)
BUN: 25 mg/dL — AB (ref 6–20)
CO2: 18 mmol/L — ABNORMAL LOW (ref 22–32)
CREATININE: 1.08 mg/dL — AB (ref 0.44–1.00)
Calcium: 8.6 mg/dL — ABNORMAL LOW (ref 8.9–10.3)
Chloride: 108 mmol/L (ref 98–111)
GFR calc Af Amer: >60 mL/min (ref >60)
GFR calc non Af Amer: >60 mL/min (ref >60)
GLUCOSE: 101 mg/dL — AB (ref 70–99)
Potassium: 6.1 mmol/L — ABNORMAL HIGH (ref 3.5–5.1)
Sodium: 132 mmol/L — ABNORMAL LOW (ref 135–145)
Total Bilirubin: 7.3 mg/dL — ABNORMAL HIGH (ref 0.3–1.2)
Total Protein: 9.1 g/dL — ABNORMAL HIGH (ref 6.5–8.1)

## 2018-03-25 LAB — PREPARE RBC (CROSSMATCH)

## 2018-03-25 LAB — BRAIN NATRIURETIC PEPTIDE: B NATRIURETIC PEPTIDE 5: 242.8 pg/mL — AB (ref 0.0–100.0)

## 2018-03-25 LAB — GLUCOSE, CAPILLARY: Glucose-Capillary: 84 mg/dL (ref 70–99)

## 2018-03-25 LAB — NA AND K (SODIUM & POTASSIUM), RAND UR
Potassium Urine: 22 mmol/L
Sodium, Ur: 107 mmol/L

## 2018-03-25 LAB — CBG MONITORING, ED
Glucose-Capillary: 44 mg/dL — CL (ref 70–99)
Glucose-Capillary: 115 mg/dL — ABNORMAL HIGH (ref 70–99)

## 2018-03-25 LAB — MAGNESIUM: Magnesium: 1.8 mg/dL (ref 1.7–2.4)

## 2018-03-25 MED ORDER — SODIUM POLYSTYRENE SULFONATE 15 GM/60ML PO SUSP
15.0000 g | Freq: Once | ORAL | Status: AC
  Administered 2018-03-25: 15 g via ORAL
  Filled 2018-03-25: qty 60

## 2018-03-25 MED ORDER — DEXTROSE 50 % IV SOLN
1.0000 | Freq: Once | INTRAVENOUS | Status: AC
  Administered 2018-03-25: 50 mL via INTRAVENOUS

## 2018-03-25 MED ORDER — INSULIN ASPART 100 UNIT/ML IV SOLN: 5.0000 [IU] | Freq: Once | INTRAVENOUS | Status: DC

## 2018-03-25 MED ORDER — METOPROLOL SUCCINATE ER 25 MG PO TB24
12.5000 mg | ORAL_TABLET | Freq: Every day | ORAL | Status: DC
  Administered 2018-03-25: 12.5 mg via ORAL
  Filled 2018-03-25: qty 1

## 2018-03-25 MED ORDER — DEFERASIROX 360 MG PO TABS: 720.0000 mg | ORAL_TABLET | Freq: Every day | ORAL | Status: DC

## 2018-03-25 MED ORDER — SODIUM CHLORIDE 0.9% IV SOLUTION: Freq: Once | INTRAVENOUS | Status: DC

## 2018-03-25 MED ORDER — HEPARIN SOD (PORK) LOCK FLUSH 100 UNIT/ML IV SOLN
500.0000 [IU] | INTRAVENOUS | Status: AC | PRN
  Administered 2018-03-25: 500 [IU]

## 2018-03-25 MED ORDER — DEXTROSE 50 % IV SOLN
INTRAVENOUS | Status: AC
  Filled 2018-03-25: qty 50

## 2018-03-25 MED ORDER — HYDROCHLOROTHIAZIDE 12.5 MG PO TABS: 25.0000 mg | ORAL_TABLET | Freq: Every day | ORAL | 0 refills | Status: DC

## 2018-03-25 MED ORDER — SODIUM CHLORIDE 0.9% FLUSH: 10.0000 mL | INTRAVENOUS | Status: DC | PRN

## 2018-03-25 MED ORDER — FOLIC ACID 1 MG PO TABS
1.0000 mg | ORAL_TABLET | Freq: Every day | ORAL | Status: DC
  Administered 2018-03-25: 1 mg via ORAL
  Filled 2018-03-25: qty 1

## 2018-03-25 NOTE — H&P (Signed)
History and Physical    Pamela Hudson ZRA:[id number] DOB: 01/04/1986 DOA: 03/24/2018  PCP: System, Pcp Not In  Patient coming from: Home  I have personally briefly reviewed patient's old medical records in [facility]  Chief Complaint: Hyperkalemia  HPI: Pamela Hudson is a 33 y.o. female with medical history significant of HGB-SS disease, iron overload, HTN.  Patient underwent parathyroidectomy ~1 month ago.  Patient was following up in endocrinologists office today, had routine labs drawn.  Endocrinologist called her and told her potassium was high and she needed to come in to the ED.  Patient is asymptomatic despite reported K of 6.7.  No CP, SOB, leg swelling, palpitations, or other symptoms.   ED Course: K 6.1.  No EKG changes.  HGB 5.7, looks like she usually runs around 7.0, was transfused last time she was 5.7 in fact.  Creat 1.0 up from 0.6 baseline.  BUN 25.  Bicarb 18 without anion gap.  Got insulin, glucose, bicarb, 1L NS in ED.  Sent to [facility] for admission.   Review of Systems: As per HPI otherwise 10 point review of systems negative.   Past Medical History:  Diagnosis Date  . Cardiomegaly   . Chronic pain   . CVA (cerebral infarction) 02/2002  . Hypertension   . Liver disease    ?iron  . PCOS (polycystic ovarian syndrome)   . Renal stone   . Sickle cell anemia (HCC)     Past Surgical History:  Procedure Laterality Date  . CHOLECYSTECTOMY    . LIVER BIOPSY  2007  . PARATHYROIDECTOMY    . PORTACATH PLACEMENT       reports that she has quit smoking. She has never used smokeless tobacco. She reports current alcohol use. She reports that she does not use drugs.  Allergies  Allergen Reactions  . Percocet [Oxycodone-Acetaminophen] Swelling    Swelling of the lip.    Family History  Problem Relation Age of Onset  . Diabetes Mellitus II Father   . CAD Father   . Diabetes Mellitus II Paternal Grandmother      Prior to Admission medications     Medication Sig Start Date End Date Taking? Authorizing Provider  Deferasirox (JADENU) 360 MG TABS Take 720 mg by mouth daily.    Yes [provider]  folic acid (FOLVITE) 1 MG tablet Take 1 mg by mouth daily. 11/21/15  Yes [provider]  hydroxyurea (HYDREA) 500 MG capsule Take 1,000 mg by mouth daily.    Yes [provider]  ibuprofen (ADVIL,MOTRIN) 200 MG tablet Take 3 tablets (600 mg total) by mouth every 6 (six) hours as needed for mild pain or moderate pain. 12/27/16  Yes Constant, Peggy, MD  magnesium oxide (MAG-OX) 400 MG tablet Take 400 mg by mouth daily. 01/26/18  Yes [provider]  metoprolol succinate (TOPROL XL) 25 MG 24 hr tablet Take 0.5 tablets (12.5 mg total) by mouth daily. 11/29/15  Yes Quentin Angst, MD  triamterene-hydrochlorothiazide (MAXZIDE-25) 37.5-25 MG tablet Take 1 tablet by mouth daily. 02/20/18  Yes Rometta Emery, MD  Vitamin D, Ergocalciferol, (DRISDOL) 1.25 MG (50000 UT) CAPS capsule Take 2 capsules by mouth every 7 (seven) days. 01/26/18  Yes [provider]    Physical Exam: Vitals:   03/25/18 0200 03/25/18 0215 03/25/18 0253 03/25/18 0301  BP: (!) 103/59 (!) 113/57  (!) 130/52  Pulse: (!) 101 (!) 105  96  Resp: (!) 25 18  20   Temp:  98.7 F (37.1 C)  TempSrc:      SpO2: 95% 96%  92%  Weight:   77.5 kg   Height:   4\' 11"  (1.499 m)     Constitutional: NAD, calm, comfortable Eyes: PERRL, lids and conjunctivae normal ENMT: Mucous membranes are moist. Posterior pharynx clear of any exudate or lesions.Normal dentition.  Neck: normal, supple, no masses, no thyromegaly Respiratory: clear to auscultation bilaterally, no wheezing, no crackles. Normal respiratory effort. No accessory muscle use.  Cardiovascular: Regular rate and rhythm, no murmurs / rubs / gallops. No extremity edema. 2+ pedal pulses. No carotid bruits.  Abdomen: no tenderness, no masses palpated. No hepatosplenomegaly. Bowel sounds  positive.  Musculoskeletal: no clubbing / cyanosis. No joint deformity upper and lower extremities. Good ROM, no contractures. Normal muscle tone.  Skin: no rashes, lesions, ulcers. No induration Neurologic: CN 2-12 grossly intact. Sensation intact, DTR normal. Strength 5/5 in all 4.  Psychiatric: Normal judgment and insight. Alert and oriented x 3. Normal mood.    Labs on Admission: I have personally reviewed following labs and imaging studies  CBC: Recent Labs  Lab 03/25/18 0005  WBC 26.7*  NEUTROABS 15.5*  HGB 5.7*  HCT 16.5*  MCV 98.2  PLT 360   Basic Metabolic Panel: Recent Labs  Lab 03/24/18 2309 03/25/18 0005  NA  --  132*  K 6.6* 6.1*  CL  --  108  CO2  --  18*  GLUCOSE  --  101*  BUN  --  25*  CREATININE  --  1.08*  CALCIUM  --  8.6*  MG  --  1.8   GFR: Estimated Creatinine Clearance: 68.4 mL/min (A) (by C-G formula based on SCr of 1.08 mg/dL (H)). Liver Function Tests: Recent Labs  Lab 03/25/18 0005  AST 124*  ALT 98*  ALKPHOS 146*  BILITOT 7.3*  PROT 9.1*  ALBUMIN 3.4*   No results for input(s): LIPASE, AMYLASE in the last 168 hours. No results for input(s): AMMONIA in the last 168 hours. Coagulation Profile: No results for input(s): INR, PROTIME in the last 168 hours. Cardiac Enzymes: No results for input(s): CKTOTAL, CKMB, CKMBINDEX, TROPONINI in the last 168 hours. BNP (last 3 results) No results for input(s): PROBNP in the last 8760 hours. HbA1C: No results for input(s): HGBA1C in the last 72 hours. CBG: Recent Labs  Lab 03/25/18 0102 03/25/18 0141 03/25/18 0304  GLUCAP 44* 115* 84   Lipid Profile: No results for input(s): CHOL, HDL, LDLCALC, TRIG, CHOLHDL, LDLDIRECT in the last 72 hours. Thyroid Function Tests: No results for input(s): TSH, T4TOTAL, FREET4, T3FREE, THYROIDAB in the last 72 hours. Anemia Panel: No results for input(s): VITAMINB12, FOLATE, FERRITIN, TIBC, IRON, RETICCTPCT in the last 72 hours. Urine analysis:      Component Value Date/Time   COLORURINE BROWN (A) 02/16/2018 2100   APPEARANCEUR CLEAR 02/16/2018 2100   LABSPEC 1.020 02/16/2018 2100   PHURINE 5.5 02/16/2018 2100   GLUCOSEU NEGATIVE 02/16/2018 2100   HGBUR MODERATE (A) 02/16/2018 2100   BILIRUBINUR MODERATE (A) 02/16/2018 2100   KETONESUR NEGATIVE 02/16/2018 2100   PROTEINUR 30 (A) 02/16/2018 2100   UROBILINOGEN 0.2 05/15/2013 2130   NITRITE NEGATIVE 02/16/2018 2100   LEUKOCYTESUR NEGATIVE 02/16/2018 2100    Radiological Exams on Admission: No results found.  EKG: Independently reviewed.  Assessment/Plan Principal Problem:   Hyperkalemia Active Problems:   Sickle cell anemia (HCC)   Essential hypertension    1. Hyperkalemia - 1. Holding triamterene-hctz 2. Repeat BMP  now 3. Check PTH 4. Check urine K 5. Check UA 2. Sickle cell anemia - not in crisis 1. Anemic with HGB 5.7 though 2. Will type and hold 2u PRBC, expect that this may take a while given antibodies anyhow.  Will defer decision regarding need for transfusion to sickle cell team in AM 3. HTN - 1. Holding trimaterene - hctz as above  DVT prophylaxis: Lovenox Code Status: Full Family Communication: No family in room Disposition Plan: Home after admit Consults called: None Admission status: Place in [place], [place]. 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  If 7AM-7PM, please contact the primary day team physician taking care of patient  [email or web address] Password [facility]  03/25/2018, 5:04 AM

## 2018-03-25 NOTE — ED Provider Notes (Signed)
Emergency Department Provider Note   I have reviewed the triage vital signs and the nursing notes.   HISTORY  Chief Complaint Abnormal Lab   HPI Pamela Hudson is a 33 y.o. female with multiple medical problems as documented below the presents the emergency department today with hyperkalemia.  Patient states that she was getting a routine follow-up with her endocrinologist today who drew labs and called her and told her potassium was high she need to come emergency room.  Patient is asymptomatic.  She has no chest pain, shortness of breath, leg swelling, palpitations or other associated symptoms. No other associated or modifying symptoms.    Past Medical History:  Diagnosis Date  . Cardiomegaly   . Chronic pain   . CVA (cerebral infarction) 02/2002  . Hypertension   . Liver disease    ?iron  . PCOS (polycystic ovarian syndrome)   . Renal stone   . Sickle cell anemia [facility])     Patient Active Problem List   Diagnosis Date Noted  . Hyperkalemia 03/25/2018  . Acute respiratory failure with hypoxia (HCC) 02/17/2018  . Essential hypertension 02/17/2018  . Acute on chronic anemia 02/17/2018  . Dyspnea 02/16/2018  . Gastroenteritis 05/28/2017  . Dehydration 05/28/2017  . AKI (acute kidney injury) (HCC) 05/28/2017  . Sickle cell anemia with crisis (HCC) 05/28/2017  . Hemorrhagic ovarian cyst 12/26/2016  . Hemorrhagic cyst of ovary 12/26/2016  . Pneumonia 11/26/2015  . Sickle cell anemia (HCC) 11/26/2015  . Elevated bilirubin 11/26/2015  . Serum calcium elevated 11/26/2015  . Hyponatremia 11/26/2015  . Leukocytosis 11/26/2015  . CVA (cerebral infarction) 02/13/2012    Past Surgical History:  Procedure Laterality Date  . CHOLECYSTECTOMY    . LIVER BIOPSY  2007  . PARATHYROIDECTOMY    . PORTACATH PLACEMENT      Current Outpatient Rx  . Order #: [id number] Class: Historical Med  . Order #: [id number] Class: Historical Med  . Order #: [id number] Class: Historical Med  .  Order #: [id number] Class: Normal  . Order #: [id number] Class: Historical Med  . Order #: [id number] Class: Print  . Order #: [id number] Class: Normal  . Order #: [id number] Class: Normal  . Order #: [id number] Class: Historical Med    Allergies Percocet [oxycodone-acetaminophen]  Family History  Problem Relation Age of Onset  . Diabetes Mellitus II Father   . CAD Father   . Diabetes Mellitus II Paternal Grandmother     Social History Social History   Tobacco Use  . Smoking status: Former Games developer  . Smokeless tobacco: Never Used  Substance Use Topics  . Alcohol use: Yes    Comment: occ  . Drug use: No    Review of Systems  All other systems negative except as documented in the HPI. All pertinent positives and negatives as reviewed in the HPI. ____________________________________________   PHYSICAL EXAM:  VITAL SIGNS: ED Triage Vitals [03/24/18 2236]  Enc Vitals Group     BP (!) 141/67     Pulse Rate (!) 103     Resp 20     Temp 98.5 F (36.9 C)     Temp Source Oral     SpO2 100 %     Weight 171 lb (77.6 kg)     Height 4\' 11"  (1.499 m)    Constitutional: Alert and oriented. Well appearing and in no acute distress. Eyes: Conjunctivae are normal. PERRL. EOMI. Head: Atraumatic. Nose: No congestion/rhinnorhea. Mouth/Throat: Mucous membranes are moist.  Oropharynx non-erythematous. Neck: No stridor.  No meningeal signs.  Cardiovascular: Normal rate, regular rhythm. Good peripheral circulation. Grossly normal heart sounds.   Respiratory: Normal respiratory effort.  No retractions. Lungs CTAB. Gastrointestinal: Soft and nontender. No distention.  Musculoskeletal: No lower extremity tenderness nor edema. No gross deformities of extremities. Neurologic:  Normal speech and language. No gross focal neurologic deficits are appreciated.  Skin:  Skin is warm, dry and intact. No rash noted.  ____________________________________________   LABS (all labs ordered are listed, but  only abnormal results are displayed)  Labs Reviewed  POTASSIUM - Abnormal; Notable for the following components:      Result Value   Potassium 6.6 (*)    All other components within normal limits  CBC WITH DIFFERENTIAL/PLATELET - Abnormal; Notable for the following components:   WBC 26.7 (*)    RBC 1.68 (*)    Hemoglobin 5.7 (*)    HCT 16.5 (*)    nRBC 0.3 (*)    Neutro Abs 15.5 (*)    Lymphs Abs 7.6 (*)    Monocytes Absolute 2.5 (*)    Eosinophils Absolute 0.9 (*)    Abs Immature Granulocytes 0.16 (*)    All other components within normal limits  COMPREHENSIVE METABOLIC PANEL - Abnormal; Notable for the following components:   Sodium 132 (*)    Potassium 6.1 (*)    CO2 18 (*)    Glucose, Bld 101 (*)    BUN 25 (*)    Creatinine, Ser 1.08 (*)    Calcium 8.6 (*)    Total Protein 9.1 (*)    Albumin 3.4 (*)    AST 124 (*)    ALT 98 (*)    Alkaline Phosphatase 146 (*)    Total Bilirubin 7.3 (*)    All other components within normal limits  CBG MONITORING, ED - Abnormal; Notable for the following components:   Glucose-Capillary 44 (*)    All other components within normal limits  MAGNESIUM  GLUCOSE, RANDOM  PTH, INTACT AND CALCIUM   ____________________________________________  EKG   EKG Interpretation  Date/Time:  Friday March 24 2018 23:51:47 EST Ventricular Rate:  92 PR Interval:    QRS Duration: 86 QT Interval:  368 QTC Calculation: 456 R Axis:   70 Text Interpretation:  Sinus rhythm Baseline wander in lead(s) I II aVR aVL aVF slightly more prominent T waves than previous in some leads, but not consistent Confirmed by Marily Memos [phone]) on 03/25/2018 1:39:55 AM       ____________________________________________  RADIOLOGY  No results found.  ____________________________________________   PROCEDURES  Procedure(s) performed:   Procedures  CRITICAL CARE Performed by: Marily Memos Total critical care time: 35 minutes Critical care time was  exclusive of separately billable procedures and treating other patients. Critical care was necessary to treat or prevent imminent or life-threatening deterioration. Critical care was time spent personally by me on the following activities: development of treatment plan with patient and/or surrogate as well as nursing, discussions with consultants, evaluation of patient's response to treatment, examination of patient, obtaining history from patient or surrogate, ordering and performing treatments and interventions, ordering and review of laboratory studies, ordering and review of radiographic studies, pulse oximetry and re-evaluation of patient's condition.  ____________________________________________   INITIAL IMPRESSION / ASSESSMENT AND PLAN / ED COURSE  Hyperkalemia and anemia.  She is asymptomatic from both.  EKG shows some T waves low bit more prominent than previously but all of her amplitudes are much larger than before as well.  No obviously peaked T waves  and not consistent with his old EKG suggest given hyperkalemia treatment without calcium.  Not able to type and screen or give blood at this facility so will need that done on admission.  Discussed with Dr. Julian ReilGardner who will accept for admission.  Pertinent labs & imaging results that were available during my care of the patient were reviewed by me and considered in my medical decision making (see chart for details).  ____________________________________________  FINAL CLINICAL IMPRESSION(S) / ED DIAGNOSES  Final diagnoses:  Hyperkalemia  Hb-SS disease without crisis (HCC)     MEDICATIONS GIVEN DURING THIS VISIT:  Medications  sodium zirconium cyclosilicate (LOKELMA) packet 10 g (10 g Oral Given 03/25/18 0006)  insulin aspart (novoLOG) injection 5 Units (5 Units Intravenous Given 03/25/18 0009)    And  dextrose 50 % solution 50 mL (50 mLs Intravenous Given 03/25/18 0010)  sodium bicarbonate injection 50 mEq (50 mEq Intravenous  Given 03/25/18 0018)  sodium chloride 0.9 % bolus 1,000 mL (0 mLs Intravenous Stopped 03/25/18 0112)  dextrose 50 % solution 50 mL (50 mLs Intravenous Given 03/25/18 0106)     NEW OUTPATIENT MEDICATIONS STARTED DURING THIS VISIT:  New Prescriptions   No medications on file    Note:  This note was prepared with assistance of Dragon voice recognition software. Occasional wrong-word or sound-a-like substitutions may have occurred due to the inherent limitations of voice recognition software.   Erendida Wrenn, Barbara CowerJason, MD 03/25/18 [phone]

## 2018-03-25 NOTE — Progress Notes (Signed)
Went over discharge papers with patient.  All questions answered.  Discharge paperwork given to patient.  Made aware of medication called into pharmacy.  VSS.  Kayexalate given.

## 2018-03-25 NOTE — ED Notes (Signed)
Pt sleeping, family at bedside. Side rails up x 2. Call bell within reach.

## 2018-03-25 NOTE — Discharge Summary (Signed)
Physician Discharge Summary  Patient ID: Pamela Hudson MRN: [id number] DOB/AGE: 06-02-1985 32 y.o.  Admit date: 03/24/2018 Discharge date: 03/25/2018  Admission Diagnoses:  Discharge Diagnoses:  Principal Problem:   Hyperkalemia Active Problems:   Sickle cell anemia (HCC)   Essential hypertension   Discharged Condition: good  Hospital Course: Patient is a 33 year old female with recent parathyroidectomy who was admitted with significant hyperkalemia.  Potassium was 6.1.  She has received Kayexalate in the ER.  Renal function appears to be stable.  Hemoglobin is 5.1 but at her baseline.  She has sickle cell disease but no pain and no crisis.  Patient was rechecked and potassium is 5.2.  Additional Kayexalate has been given.  Renal function continues to do well.  She was taken off ibuprofen as well as her triamterene hydrochlorothiazide.  Instead patient will take hydrochlorothiazide plain with metoprolol for her blood pressure control.  Follow-up with her PCP on Monday for additional BMP.  Consults: None  Significant Diagnostic Studies: labs: Serial CBCs and CMP's were checked.  Potassium level was initially elevated currently back to normal  Treatments: IV hydration  Discharge Exam: Blood pressure (!) 111/43, pulse 94, temperature 99.2 F (37.3 C), temperature source Oral, resp. rate (!) 24, height 4\' 11"  (1.499 m), weight 77.5 kg, SpO2 95 %. General appearance: alert, cooperative and no distress Head: Normocephalic, without obvious abnormality, atraumatic Neck: no adenopathy, no carotid bruit, no JVD, supple, symmetrical, trachea midline and thyroid not enlarged, symmetric, no tenderness/mass/nodules Back: symmetric, no curvature. ROM normal. No CVA tenderness. Resp: clear to auscultation bilaterally Cardio: regular rate and rhythm, S1, S2 normal, no murmur, click, rub or gallop GI: soft, non-tender; bowel sounds normal; no masses,  no organomegaly Extremities: extremities normal,  atraumatic, no cyanosis or edema Pulses: 2+ and symmetric Lymph nodes: Cervical, supraclavicular, and axillary nodes normal. Neurologic: Grossly normal  Disposition: Discharge disposition: 01-Home or Self Care       Discharge Instructions    Diet - low sodium heart healthy   Complete by:  As directed    Increase activity slowly   Complete by:  As directed      Allergies as of 03/25/2018      Reactions   Percocet [oxycodone-acetaminophen] Swelling   Swelling of the lip.      Medication List    STOP taking these medications   ibuprofen 200 MG tablet Commonly known as:  ADVIL,MOTRIN   triamterene-hydrochlorothiazide 37.5-25 MG tablet Commonly known as:  MAXZIDE-25     TAKE these medications   folic acid 1 MG tablet Commonly known as:  FOLVITE Take 1 mg by mouth daily.   hydrochlorothiazide 12.5 MG tablet Commonly known as:  HYDRODIURIL Take 2 tablets (25 mg total) by mouth daily.   hydroxyurea 500 MG capsule Commonly known as:  HYDREA Take 1,000 mg by mouth daily.   JADENU 360 MG Tabs Generic drug:  Deferasirox Take 720 mg by mouth daily.   magnesium oxide 400 MG tablet Commonly known as:  MAG-OX Take 400 mg by mouth daily.   metoprolol succinate 25 MG 24 hr tablet Commonly known as:  TOPROL XL Take 0.5 tablets (12.5 mg total) by mouth daily.   Vitamin D (Ergocalciferol) 1.25 MG (50000 UT) Caps capsule Commonly known as:  DRISDOL Take 2 capsules by mouth every 7 (seven) days.        SignedLonia Blood 03/25/2018, 4:01 PM

## 2018-03-25 NOTE — ED Notes (Signed)
[facility] notified (Kim) - patient ready for transport 

## 2018-03-27 LAB — PTH, INTACT AND CALCIUM
Calcium, Total (PTH): 8.4 mg/dL — ABNORMAL LOW (ref 8.7–10.2)
PTH: 83 pg/mL — ABNORMAL HIGH (ref 15–65)

## 2018-03-27 LAB — GLUCOSE, CAPILLARY: Glucose-Capillary: 98 mg/dL (ref 70–99)

## 2018-03-29 LAB — TYPE AND SCREEN
ABO/RH(D): B POS
Antibody Screen: NEGATIVE
Donor AG Type: NEGATIVE
UNIT DIVISION: 0

## 2018-03-29 LAB — BPAM RBC
Blood Product Expiration Date: 202001242359
Unit Type and Rh: 9500

## 2018-07-09 ENCOUNTER — Emergency Department (HOSPITAL_BASED_OUTPATIENT_CLINIC_OR_DEPARTMENT_OTHER): Payer: Medicaid Other

## 2018-07-09 ENCOUNTER — Other Ambulatory Visit: Payer: Self-pay

## 2018-07-09 ENCOUNTER — Inpatient Hospital Stay (HOSPITAL_BASED_OUTPATIENT_CLINIC_OR_DEPARTMENT_OTHER)
Admission: EM | Admit: 2018-07-09 | Discharge: 2018-07-12 | DRG: 291 | Disposition: A | Discharge status: Home / Self Care | Payer: Medicaid Other | Attending: Internal Medicine | Admitting: Internal Medicine

## 2018-07-09 ENCOUNTER — Encounter (HOSPITAL_BASED_OUTPATIENT_CLINIC_OR_DEPARTMENT_OTHER): Payer: Self-pay | Admitting: Emergency Medicine

## 2018-07-09 DIAGNOSIS — I2510 Atherosclerotic heart disease of native coronary artery without angina pectoris: Secondary | ICD-10-CM | POA: Diagnosis present

## 2018-07-09 DIAGNOSIS — K769 Liver disease, unspecified: Secondary | ICD-10-CM | POA: Diagnosis present

## 2018-07-09 DIAGNOSIS — I5031 Acute diastolic (congestive) heart failure: Secondary | ICD-10-CM | POA: Diagnosis present

## 2018-07-09 DIAGNOSIS — D638 Anemia in other chronic diseases classified elsewhere: Secondary | ICD-10-CM | POA: Diagnosis present

## 2018-07-09 DIAGNOSIS — N182 Chronic kidney disease, stage 2 (mild): Secondary | ICD-10-CM | POA: Diagnosis present

## 2018-07-09 DIAGNOSIS — E876 Hypokalemia: Secondary | ICD-10-CM | POA: Diagnosis present

## 2018-07-09 DIAGNOSIS — Z8673 Personal history of transient ischemic attack (TIA), and cerebral infarction without residual deficits: Secondary | ICD-10-CM | POA: Diagnosis not present

## 2018-07-09 DIAGNOSIS — I130 Hypertensive heart and chronic kidney disease with heart failure and stage 1 through stage 4 chronic kidney disease, or unspecified chronic kidney disease: Principal | ICD-10-CM | POA: Diagnosis present

## 2018-07-09 DIAGNOSIS — Z79899 Other long term (current) drug therapy: Secondary | ICD-10-CM | POA: Diagnosis not present

## 2018-07-09 DIAGNOSIS — Z833 Family history of diabetes mellitus: Secondary | ICD-10-CM

## 2018-07-09 DIAGNOSIS — I10 Essential (primary) hypertension: Secondary | ICD-10-CM | POA: Diagnosis not present

## 2018-07-09 DIAGNOSIS — I361 Nonrheumatic tricuspid (valve) insufficiency: Secondary | ICD-10-CM

## 2018-07-09 DIAGNOSIS — G894 Chronic pain syndrome: Secondary | ICD-10-CM | POA: Diagnosis present

## 2018-07-09 DIAGNOSIS — I5033 Acute on chronic diastolic (congestive) heart failure: Secondary | ICD-10-CM | POA: Diagnosis not present

## 2018-07-09 DIAGNOSIS — I272 Pulmonary hypertension, unspecified: Secondary | ICD-10-CM | POA: Diagnosis not present

## 2018-07-09 DIAGNOSIS — I50810 Right heart failure, unspecified: Secondary | ICD-10-CM | POA: Diagnosis not present

## 2018-07-09 DIAGNOSIS — Z8249 Family history of ischemic heart disease and other diseases of the circulatory system: Secondary | ICD-10-CM

## 2018-07-09 DIAGNOSIS — I071 Rheumatic tricuspid insufficiency: Secondary | ICD-10-CM | POA: Diagnosis present

## 2018-07-09 DIAGNOSIS — I509 Heart failure, unspecified: Secondary | ICD-10-CM

## 2018-07-09 DIAGNOSIS — E871 Hypo-osmolality and hyponatremia: Secondary | ICD-10-CM | POA: Diagnosis present

## 2018-07-09 DIAGNOSIS — Z87891 Personal history of nicotine dependence: Secondary | ICD-10-CM

## 2018-07-09 DIAGNOSIS — R0600 Dyspnea, unspecified: Secondary | ICD-10-CM | POA: Diagnosis not present

## 2018-07-09 DIAGNOSIS — I2729 Other secondary pulmonary hypertension: Secondary | ICD-10-CM | POA: Diagnosis present

## 2018-07-09 DIAGNOSIS — D571 Sickle-cell disease without crisis: Secondary | ICD-10-CM | POA: Diagnosis present

## 2018-07-09 LAB — COMPREHENSIVE METABOLIC PANEL
ALT: 87 U/L — ABNORMAL HIGH (ref 0–44)
AST: 108 U/L — ABNORMAL HIGH (ref 15–41)
Albumin: 3.5 g/dL (ref 3.5–5.0)
Alkaline Phosphatase: 122 U/L (ref 38–126)
Anion gap: 5 (ref 5–15)
BUN: 23 mg/dL — ABNORMAL HIGH (ref 6–20)
CO2: 20 mmol/L — ABNORMAL LOW (ref 22–32)
Calcium: 8.9 mg/dL (ref 8.9–10.3)
Chloride: 107 mmol/L (ref 98–111)
Creatinine, Ser: 0.94 mg/dL (ref 0.44–1.00)
GFR calc Af Amer: >60 mL/min (ref >60)
GFR calc non Af Amer: >60 mL/min (ref >60)
Glucose, Bld: 141 mg/dL — ABNORMAL HIGH (ref 70–99)
Potassium: 5.3 mmol/L — ABNORMAL HIGH (ref 3.5–5.1)
Sodium: 132 mmol/L — ABNORMAL LOW (ref 135–145)
Total Bilirubin: 9.6 mg/dL — ABNORMAL HIGH (ref 0.3–1.2)
Total Protein: 10.3 g/dL — ABNORMAL HIGH (ref 6.5–8.1)

## 2018-07-09 LAB — CBC WITH DIFFERENTIAL/PLATELET
Abs Immature Granulocytes: 0.18 10*3/uL — ABNORMAL HIGH (ref 0.00–0.07)
Basophils Absolute: 0.1 10*3/uL (ref 0.0–0.1)
Basophils Relative: 1 %
Eosinophils Absolute: 0.4 10*3/uL (ref 0.0–0.5)
Eosinophils Relative: 2 %
HCT: 15.3 % — ABNORMAL LOW (ref 36.0–46.0)
Hemoglobin: 5.4 g/dL — CL (ref 12.0–15.0)
Immature Granulocytes: 1 %
Lymphocytes Relative: 26 %
Lymphs Abs: 5.4 10*3/uL — ABNORMAL HIGH (ref 0.7–4.0)
MCH: 42.9 pg — ABNORMAL HIGH (ref 26.0–34.0)
MCHC: 35.3 g/dL (ref 30.0–36.0)
MCV: 121.4 fL — ABNORMAL HIGH (ref 80.0–100.0)
Monocytes Absolute: 2.1 10*3/uL — ABNORMAL HIGH (ref 0.1–1.0)
Monocytes Relative: 10 %
Neutro Abs: 12.5 10*3/uL — ABNORMAL HIGH (ref 1.7–7.7)
Neutrophils Relative %: 60 %
Platelets: 268 10*3/uL (ref 150–400)
RBC: 1.26 MIL/uL — ABNORMAL LOW (ref 3.87–5.11)
RDW: 19.3 % — ABNORMAL HIGH (ref 11.5–15.5)
WBC: 20.7 10*3/uL — ABNORMAL HIGH (ref 4.0–10.5)
nRBC: 3.5 % — ABNORMAL HIGH (ref 0.0–0.2)

## 2018-07-09 LAB — CK TOTAL AND CKMB (NOT AT ARMC)
CK, MB: 0.8 ng/mL (ref 0.5–5.0)
Relative Index: INVALID (ref 0.0–2.5)
Total CK: 33 U/L — ABNORMAL LOW (ref 38–234)

## 2018-07-09 LAB — BRAIN NATRIURETIC PEPTIDE: B Natriuretic Peptide: 449.3 pg/mL — ABNORMAL HIGH (ref 0.0–100.0)

## 2018-07-09 LAB — TROPONIN I: Troponin I: <0.03 ng/mL (ref <0.03)

## 2018-07-09 MED ORDER — MAGNESIUM OXIDE 400 (241.3 MG) MG PO TABS
400.0000 mg | ORAL_TABLET | Freq: Every day | ORAL | Status: DC
  Administered 2018-07-10 – 2018-07-12 (×3): 400 mg via ORAL
  Filled 2018-07-09 (×3): qty 1

## 2018-07-09 MED ORDER — METOPROLOL SUCCINATE ER 25 MG PO TB24
12.5000 mg | ORAL_TABLET | Freq: Every day | ORAL | Status: DC
  Administered 2018-07-09 – 2018-07-12 (×4): 12.5 mg via ORAL
  Filled 2018-07-09 (×4): qty 1

## 2018-07-09 MED ORDER — FUROSEMIDE 10 MG/ML IJ SOLN: 40.0000 mg | Freq: Four times a day (QID) | INTRAMUSCULAR | Status: DC

## 2018-07-09 MED ORDER — VITAMIN D (ERGOCALCIFEROL) 1.25 MG (50000 UNIT) PO CAPS
100000.0000 [IU] | ORAL_CAPSULE | ORAL | Status: DC
  Filled 2018-07-09: qty 2

## 2018-07-09 MED ORDER — HYDROXYUREA 500 MG PO CAPS
1000.0000 mg | ORAL_CAPSULE | Freq: Every day | ORAL | Status: DC
  Administered 2018-07-10 – 2018-07-12 (×3): 1000 mg via ORAL
  Filled 2018-07-09 (×3): qty 2

## 2018-07-09 MED ORDER — FUROSEMIDE 10 MG/ML IJ SOLN
40.0000 mg | Freq: Two times a day (BID) | INTRAMUSCULAR | Status: DC
  Administered 2018-07-10 – 2018-07-12 (×5): 40 mg via INTRAVENOUS
  Filled 2018-07-09 (×6): qty 4

## 2018-07-09 MED ORDER — FUROSEMIDE 10 MG/ML IJ SOLN
40.0000 mg | Freq: Once | INTRAMUSCULAR | Status: AC
  Administered 2018-07-09: 15:00:00 40 mg via INTRAVENOUS
  Filled 2018-07-09: qty 4

## 2018-07-09 MED ORDER — POTASSIUM CHLORIDE 20 MEQ PO PACK
20.0000 meq | PACK | Freq: Every day | ORAL | Status: DC
  Filled 2018-07-09: qty 1

## 2018-07-09 MED ORDER — POTASSIUM CHLORIDE 20 MEQ PO PACK
20.0000 meq | PACK | Freq: Every day | ORAL | Status: DC
  Filled 2018-07-09 (×2): qty 1

## 2018-07-09 MED ORDER — FOLIC ACID 1 MG PO TABS
1.0000 mg | ORAL_TABLET | Freq: Every day | ORAL | Status: DC
  Administered 2018-07-10 – 2018-07-12 (×3): 1 mg via ORAL
  Filled 2018-07-09 (×3): qty 1

## 2018-07-09 MED ORDER — ENOXAPARIN SODIUM 40 MG/0.4ML ~~LOC~~ SOLN
40.0000 mg | Freq: Every day | SUBCUTANEOUS | Status: DC
  Administered 2018-07-10: 40 mg via SUBCUTANEOUS
  Filled 2018-07-09 (×3): qty 0.4

## 2018-07-09 MED ORDER — DEFERASIROX 360 MG PO TABS: 720.0000 mg | ORAL_TABLET | Freq: Every day | ORAL | Status: DC

## 2018-07-09 NOTE — ED Notes (Signed)
ED Provider at bedside. 

## 2018-07-09 NOTE — ED Notes (Signed)
HGB 5.4, ED MD and Augusto Gamble' RN informed

## 2018-07-09 NOTE — ED Notes (Signed)
Pt states she took lasix pta

## 2018-07-09 NOTE — Consult Note (Signed)
[facility] Consult Note   Attending Physician: Illene Regulus, MD Primary Cardiologist:  New cardiologist at [facility] C. Rohrbeck, MD)  Reason for Consultation: Worsening shortness of breath, weight gain and lower extremity edema  HPI:    Pamela Hudson is seen today for evaluation of dyspnea at the request of Dr. Debby Bud. This is a 33 year old female with a past medical history significant for sickle cell anemia, pulmonary hypertension, right heart failure, chronic pain, CVA, liver disease, renal stones and polycystic ovarian syndrome who presents with recent weight gain and significant lower extremity edema.  Back in December 2019 she was evaluated by Dr. Yates Decamp (from [facility]), for shortness of breath and leg edema.  On admission her hemoglobin was 5.2. She also had some transient ST changes which were felt to be secondary to coronary spasm.  She received IV diuretics with improvement in her dyspnea and leg swelling. Her echocardiogram at the time revealed preserved LV function with severe tricuspid regurgitation and evidence of RV strain. She was started on Maxide (triamterene/hydrochlorothiazide) 37.5/25 mg 1 p.o. every morning.  She was also started on low-dose beta-blockers.  On 03/24/2018, she was admitted to the hospital with a potassium of 6.7. The Maxide was discontinued and she was only kept on HCTZ.  She is now admitted with complaints of a 40 lbs weight gain over the past month.  She has also had increased swelling in her lower extremities along with exertional shortness of breath and orthopnea.  A week ago she was started on Lasix by her PCP.  She has not seen much response from this medication. In the ED the serum creatinine was 1.94 with a potassium of 5.3 and a white cell count of 20,000.  Hemoglobin was 5.4. BNP was elevated at 449.3 and troponin was within normal limits (<0.03). The ECG showed poor R wave progression in the anterior precordial  leads.  She recently had an echocardiogram performed in the cardiology clinic at [facility] (ordered by Dr. Gwenlyn Perking. Rohrbeck) . She does not know the results. She has not seen the provider yet either.   Home Medications Prior to Admission medications   Medication Sig Start Date End Date Taking? Authorizing Provider  Deferasirox (JADENU) 360 MG TABS Take 720 mg by mouth daily.     [provider]  folic acid (FOLVITE) 1 MG tablet Take 1 mg by mouth daily. 11/21/15   [provider]  hydrochlorothiazide (HYDRODIURIL) 12.5 MG tablet Take 2 tablets (25 mg total) by mouth daily. 03/25/18   Rometta Emery, MD  hydroxyurea (HYDREA) 500 MG capsule Take 1,000 mg by mouth daily.     [provider]  magnesium oxide (MAG-OX) 400 MG tablet Take 400 mg by mouth daily. 01/26/18   [provider]  metoprolol succinate (TOPROL XL) 25 MG 24 hr tablet Take 0.5 tablets (12.5 mg total) by mouth daily. 11/29/15   Quentin Angst, MD  Vitamin D, Ergocalciferol, (DRISDOL) 1.25 MG (50000 UT) CAPS capsule Take 2 capsules by mouth every 7 (seven) days. 01/26/18   [provider]    Past Medical History: Past Medical History:  Diagnosis Date   Cardiomegaly    Chronic pain    CVA (cerebral infarction) 02/2002   Hypertension    Liver disease    ?iron   PCOS (polycystic ovarian syndrome)    Renal stone    Sickle cell anemia (HCC)     Past Surgical History: Past Surgical History:  Procedure Laterality Date   CHOLECYSTECTOMY     LIVER BIOPSY  2007   PARATHYROIDECTOMY     PORTACATH PLACEMENT      Family History: Family History  Problem Relation Age of Onset   Diabetes Mellitus II Father    CAD Father    Diabetes Mellitus II Paternal Grandmother     Social History: Social History   Socioeconomic History   Marital status: Single    Spouse name: Not on file   Number of children: Not on file   Years of education: Not on file    Highest education level: Not on file  Occupational History   Not on file  Social Needs   Financial resource strain: Not on file   Food insecurity:    Worry: Not on file    Inability: Not on file   Transportation needs:    Medical: Not on file    Non-medical: Not on file  Tobacco Use   Smoking status: Former Smoker   Smokeless tobacco: Never Used  Substance and Sexual Activity   Alcohol use: Yes    Comment: occ   Drug use: No   Sexual activity: Yes    Birth control/protection: Implant  Lifestyle   Physical activity:    Days per week: Not on file    Minutes per session: Not on file   Stress: Not on file  Relationships   Social connections:    Talks on phone: Not on file    Gets together: Not on file    Attends religious service: Not on file    Active member of club or organization: Not on file    Attends meetings of clubs or organizations: Not on file    Relationship status: Not on file  Other Topics Concern   Not on file  Social History Narrative   Not on file    Allergies:  Allergies  Allergen Reactions   Percocet [Oxycodone-Acetaminophen] Swelling    Swelling of the lip.     Review of Systems: [y] = yes, [ ]  = no    General: Weight gain [Y ]; Weight loss [ ] ; Anorexia [ ] ; Fatigue [ ] ; Fever [ ] ; Chills [ ] ; Weakness [ ]    Cardiac: Chest pain/pressure [ ] ; Resting SOB [ ] ; Exertional SOB [Y ]; Orthopnea [Y ]; Pedal Edema [Y ]; Palpitations [ ] ; Syncope [ ] ; Presyncope [ ] ; Paroxysmal nocturnal dyspnea[ ]    Pulmonary: Cough [ ] ; Wheezing[ ] ; Hemoptysis[ ] ; Sputum [ ] ; Snoring [ ]    GI: Vomiting[ ] ; Dysphagia[ ] ; Melena[ ] ; Hematochezia [ ] ; Heartburn[ ] ; Abdominal pain [ ] ; Constipation [ ] ; Diarrhea [ ] ; BRBPR [ ]    GU: Hematuria[ ] ; Dysuria [ ] ; Nocturia[ ]    Vascular: Pain in legs with walking [ ] ; Pain in feet with lying flat [ ] ; Non-healing sores [ ] ; Stroke [ ] ; TIA [ ] ; Slurred speech [ ] ;   Neuro: Headaches[ ] ; Vertigo[ ] ;  Seizures[ ] ; Paresthesias[ ] ;Blurred vision [ ] ; Diplopia [ ] ; Vision changes [ ]    Ortho/Skin: Arthritis [ ] ; Joint pain [ ] ; Muscle pain [ ] ; Joint swelling [ ] ; Back Pain [ ] ; Rash [ ]    Psych: Depression[ ] ; Anxiety[ ]    Heme: Bleeding problems [ ] ; Clotting disorders [ ] ; Anemia [ ]    Endocrine: Diabetes [ ] ; Thyroid dysfunction[ ]      Objective:    Vital Signs:   Temp:  [98.4 F (36.9 C)] 98.4 F (36.9  C) (04/26 1716) Pulse Rate:  [109] 109 (04/26 1716) Resp:  [19-23] 19 (04/26 1716) BP: (131-143)/(48-74) 131/48 (04/26 1716) SpO2:  [95 %-100 %] 100 % (04/26 1716) Weight:  [101.2 kg-102.3 kg] 101.2 kg (04/26 1716) Last BM Date: 07/09/18  Weight change: Filed Weights   07/09/18 1310 07/09/18 1716  Weight: 102.3 kg 101.2 kg    Intake/Output:  No intake or output data in the 24 hours ending 07/09/18 1909    Physical Exam    General:  Well appearing. No resp difficulty, no acute distress HEENT: normal Neck: supple. Difficult to assess JVP due to body habitus. Carotids 2+ bilat; no bruits. No lymphadenopathy or thyromegaly appreciated. Cor: PMI nondisplaced. Regular rate & rhythm. No rubs, gallops or murmurs. Lungs: clear to auscultation bilaterally, no crackles or wheezes Abdomen: Obese, soft, nontender, nondistended. No hepatosplenomegaly. No bruits or masses. Good bowel sounds. Extremities: no cyanosis, clubbing, rash, 2+ pitting edema up to the knees bilaterally Neuro: alert & orientedx3, cranial nerves grossly intact. moves all 4 extremities w/o difficulty.  Skin: No rashes, no lesion Psychiatric: Affect pleasant    EKG    ECG 07/09/2018: Sinus tachycardia, borderline right axis deviation, low voltage in precordial leads, borderline T abnormalities in anterior leads.  Labs   Basic Metabolic Panel: Recent Labs  Lab 07/09/18 1409  NA 132*  K 5.3*  CL 107  CO2 20*  GLUCOSE 141*  BUN 23*  CREATININE 0.94  CALCIUM 8.9    Liver Function  Tests: Recent Labs  Lab 07/09/18 1409  AST 108*  ALT 87*  ALKPHOS 122  BILITOT 9.6*  PROT 10.3*  ALBUMIN 3.5   No results for input(s): LIPASE, AMYLASE in the last 168 hours. No results for input(s): AMMONIA in the last 168 hours.  CBC: Recent Labs  Lab 07/09/18 1409  WBC 20.7*  NEUTROABS 12.5*  HGB 5.4*  HCT 15.3*  MCV 121.4*  PLT 268    Cardiac Enzymes: Recent Labs  Lab 07/09/18 1409  TROPONINI <0.03    BNP: BNP (last 3 results) Recent Labs    02/16/18 1825 03/25/18 1223 07/09/18 1409  BNP 303.2* 242.8* 449.3*    ProBNP (last 3 results) No results for input(s): PROBNP in the last 8760 hours.   CBG: No results for input(s): GLUCAP in the last 168 hours.  Coagulation Studies: No results for input(s): LABPROT, INR in the last 72 hours.   Imaging   Dg Chest 2 View  Result Date: 07/09/2018 CLINICAL DATA:  33 year old female with history of bilateral lower extremity edema and shortness of breath since March 2020. EXAM: CHEST - 2 VIEW COMPARISON:  Chest x-ray 02/16/2018. FINDINGS: Lung volumes are normal. No consolidative airspace disease. No pleural effusions. Severe pulmonary venous congestion, without frank pulmonary edema. Mild cardiomegaly. Upper mediastinal contours are within normal limits. Right internal jugular double-lumen power porta cath with tip terminating in the distal superior vena cava. IMPRESSION: 1. Cardiomegaly with pulmonary venous congestion, but no frank pulmonary edema. Electronically Signed   By: Trudie Reed M.D.   On: 07/09/2018 14:51     Medications:     Current Medications:  Deferasirox  720 mg Oral Daily   folic acid  1 mg Oral Daily   hydroxyurea  1,000 mg Oral Daily   magnesium oxide  400 mg Oral Daily   metoprolol succinate  12.5 mg Oral Daily   Vitamin D (Ergocalciferol)  100,000 Units Oral Q7 days     Infusions:   CARDIAC/VASCULAR STUDIES: ECHO  02/17/2018: Left ventricular cavity size was normal.   Diastolic function.  Mild MR, severe TR.  PASP 56 mmHg  Venous duplex 02/17/2018: Right: There is no evidence of deep vein thrombosis in the lower extremity. No cystic structure found in the popliteal fossa. Pulsatile venous flow suggestive of increased right side heart pressure. Left: There is no evidence of deep vein thrombosis in the lower extremity. No cystic structure found in the popliteal fossa. Pulsatile venous flow suggestive of increased right side heart pressure.   Assessment/Plan   1.  Right-sided heart failure with severe tricuspid regurgitation  -Need to obtain final read from echo performed at [facility] -Daily weights, monitor I&Os, serum creatinine -Maintain serum K >4.0 and Mg >2.0 -Continue HCTZ at 25 mg daily -IV furosemide (titrated to adequate response). Initially start at 40mg  IV q12 hours. -Continue Toprol XL 25mg  daily -Patient will benefit from a sleep study to rule out OSA.   Lonie Peak, MD  07/09/2018, 7:09 PM  Cardiology Overnight Team Please contact [facility] for night-coverage after hours (4p -7a ) and weekends on [email or web address]

## 2018-07-09 NOTE — ED Triage Notes (Signed)
Pt c/o bilateral leg swelling and shortness of breath since mid March but has worsened over last week. Pt denies fever

## 2018-07-09 NOTE — ED Provider Notes (Signed)
[facility] EMERGENCY DEPARTMENT Provider Note   CSN: [id number] Arrival date & time: 07/09/18  1300    History   Chief Complaint Chief Complaint  Patient presents with  . Shortness of Breath  . Leg Swelling    HPI Cristina GongDevon Leas is a 33 y.o. female.     Patient is a 33 year old female with multiple medical problems including sickle cell disease with a chronic hemoglobin of 5, hypertension, liver disease, CVA, right-sided CHF and diastolic dysfunction who is presenting today with 2 months of gradually worsening shortness of breath and swelling.  Patient states that she saw her doctor in January and at that time her weight was 173.  However throughout March and April she has had gradual worsening swelling in her lower extremities, exertional dyspnea and orthopnea.  Patient states that in March when she saw her PCP her weight was up to 200 and she was restarted on HCTZ.  She then saw her doctor 1 month later when symptoms were not improving and her weight was up to 220.  She was switched over to furosemide approximately 1 week ago and she saw cardiology.  At that time they wanted to repeat an echo which was done on Friday but she has not heard the results.  She states over the weekend her shortness of breath has worsened and now she is having pain in her legs from all of the fluid that is stretching her skin.  She does not feel like this is a sickle cell pain crisis as everything is just uncomfortable because of the stretching.  She denies any nausea, vomiting or chest pain.  She has not had fever or cough.  She continues to take the furosemide but does not feel like it is doing anything.  Patient's last lab work was done approximately 1 month ago on 06/01/2018.  She was admitted to [facility] in December for concern for fluid overload and at that time had hyperkalemia and required medication changes.  The history is provided by the patient.  Shortness of Breath    Past Medical  History:  Diagnosis Date  . Cardiomegaly   . Chronic pain   . CVA (cerebral infarction) 02/2002  . Hypertension   . Liver disease    ?iron  . PCOS (polycystic ovarian syndrome)   . Renal stone   . Sickle cell anemia [facility](HCC)     Patient Active Problem List   Diagnosis Date Noted  . Hyperkalemia 03/25/2018  . Acute respiratory failure with hypoxia (HCC) 02/17/2018  . Essential hypertension 02/17/2018  . Acute on chronic anemia 02/17/2018  . Dyspnea 02/16/2018  . Gastroenteritis 05/28/2017  . Dehydration 05/28/2017  . AKI (acute kidney injury) (HCC) 05/28/2017  . Sickle cell anemia with crisis (HCC) 05/28/2017  . Hemorrhagic ovarian cyst 12/26/2016  . Hemorrhagic cyst of ovary 12/26/2016  . Pneumonia 11/26/2015  . Sickle cell anemia (HCC) 11/26/2015  . Elevated bilirubin 11/26/2015  . Serum calcium elevated 11/26/2015  . Hyponatremia 11/26/2015  . Leukocytosis 11/26/2015  . CVA (cerebral infarction) 02/13/2012    Past Surgical History:  Procedure Laterality Date  . CHOLECYSTECTOMY    . LIVER BIOPSY  2007  . PARATHYROIDECTOMY    . PORTACATH PLACEMENT       OB History   No obstetric history on file.      Home Medications    Prior to Admission medications   Medication Sig Start Date End Date Taking? Authorizing Provider  Deferasirox (JADENU) 360 MG TABS  Take 720 mg by mouth daily.     [provider]  folic acid (FOLVITE) 1 MG tablet Take 1 mg by mouth daily. 11/21/15   [provider]  hydrochlorothiazide (HYDRODIURIL) 12.5 MG tablet Take 2 tablets (25 mg total) by mouth daily. 03/25/18   Rometta Emery, MD  hydroxyurea (HYDREA) 500 MG capsule Take 1,000 mg by mouth daily.     [provider]  magnesium oxide (MAG-OX) 400 MG tablet Take 400 mg by mouth daily. 01/26/18   [provider]  metoprolol succinate (TOPROL XL) 25 MG 24 hr tablet Take 0.5 tablets (12.5 mg total) by mouth daily. 11/29/15   Quentin Angst, MD  Vitamin  D, Ergocalciferol, (DRISDOL) 1.25 MG (50000 UT) CAPS capsule Take 2 capsules by mouth every 7 (seven) days. 01/26/18   [provider]    Family History Family History  Problem Relation Age of Onset  . Diabetes Mellitus II Father   . CAD Father   . Diabetes Mellitus II Paternal Grandmother     Social History Social History   Tobacco Use  . Smoking status: Former Games developer  . Smokeless tobacco: Never Used  Substance Use Topics  . Alcohol use: Yes    Comment: occ  . Drug use: No     Allergies   Percocet [oxycodone-acetaminophen]   Review of Systems Review of Systems  Respiratory: Positive for shortness of breath.   All other systems reviewed and are negative.    Physical Exam Updated Vital Signs Pulse (!) 109   Resp 20   Ht  (1.499 m)   Wt 102.3 kg   SpO2 100%   BMI 45.55 kg/m   Physical Exam Vitals signs and nursing note reviewed.  Constitutional:      General: She is not in acute distress.    Appearance: She is well-developed. She is obese.  HENT:     Head: Normocephalic and atraumatic.  Eyes:     Pupils: Pupils are equal, round, and reactive to light.  Cardiovascular:     Rate and Rhythm: Regular rhythm. Tachycardia present.     Heart sounds: Murmur present. No friction rub.  Pulmonary:     Effort: Pulmonary effort is normal. No tachypnea.     Breath sounds: Normal breath sounds. No wheezing or rales.  Abdominal:     General: Bowel sounds are normal. There is no distension.     Palpations: Abdomen is soft.     Tenderness: There is no abdominal tenderness. There is no guarding or rebound.     Comments: Abdominal distention  Musculoskeletal: Normal range of motion.     Right lower leg: Edema present.     Left lower leg: Edema present.     Comments: Pitting edema up to the thighs bilaterally  Skin:    General: Skin is warm and dry.     Findings: No rash.  Neurological:     Mental Status: She is alert and oriented to person, place, and  time.     Cranial Nerves: No cranial nerve deficit.  Psychiatric:        Behavior: Behavior normal.      ED Treatments / Results  Labs (all labs ordered are listed, but only abnormal results are displayed) Labs Reviewed  CBC WITH DIFFERENTIAL/PLATELET - Abnormal; Notable for the following components:      Result Value   WBC 20.7 (*)    RBC 1.26 (*)    Hemoglobin 5.4 (*)  HCT 15.3 (*)    MCV 121.4 (*)    MCH 42.9 (*)    RDW 19.3 (*)    nRBC 3.5 (*)    Neutro Abs 12.5 (*)    Lymphs Abs 5.4 (*)    Monocytes Absolute 2.1 (*)    Abs Immature Granulocytes 0.18 (*)    All other components within normal limits  COMPREHENSIVE METABOLIC PANEL - Abnormal; Notable for the following components:   Sodium 132 (*)    Potassium 5.3 (*)    CO2 20 (*)    Glucose, Bld 141 (*)    BUN 23 (*)    Total Protein 10.3 (*)    AST 108 (*)    ALT 87 (*)    Total Bilirubin 9.6 (*)    All other components within normal limits  BRAIN NATRIURETIC PEPTIDE - Abnormal; Notable for the following components:   B Natriuretic Peptide 449.3 (*)    All other components within normal limits  TROPONIN I    EKG EKG Interpretation  Date/Time:  Sunday July 09 2018 13:16:20 EDT Ventricular Rate:  115 PR Interval:    QRS Duration: 81 QT Interval:  319 QTC Calculation: 442 R Axis:   95 Text Interpretation:  Sinus tachycardia Borderline right axis deviation Low voltage, precordial leads Borderline T abnormalities, anterior leads No significant change since last tracing Confirmed by Gwyneth Sprout ([id number]) on 07/09/2018 1:42:59 PM   Radiology Dg Chest 2 View  Result Date: 07/09/2018 CLINICAL DATA:  33 year old female with history of bilateral lower extremity edema and shortness of breath since March 2020. EXAM: CHEST - 2 VIEW COMPARISON:  Chest x-ray 02/16/2018. FINDINGS: Lung volumes are normal. No consolidative airspace disease. No pleural effusions. Severe pulmonary venous congestion, without frank  pulmonary edema. Mild cardiomegaly. Upper mediastinal contours are within normal limits. Right internal jugular double-lumen power porta cath with tip terminating in the distal superior vena cava. IMPRESSION: 1. Cardiomegaly with pulmonary venous congestion, but no frank pulmonary edema. Electronically Signed   By: Trudie Reed M.D.   On: 07/09/2018 14:51    Procedures Procedures (including critical care time)  Medications Ordered in ED Medications  furosemide (LASIX) injection 40 mg (40 mg Intravenous Given 07/09/18 1500)     Initial Impression / Assessment and Plan / ED Course  I have reviewed the triage vital signs and the nursing notes.  Pertinent labs & imaging results that were available during my care of the patient were reviewed by me and considered in my medical decision making (see chart for details).       Patient is a 33 year old female presenting today with symptoms concerning for CHF exacerbation.  For the last 2 months she has had gradual worsening swelling in her lower extremities and now her abdomen.  She has had approximately 40-50 pound weight gain based on her doctors visits in the last 2 months.  She was recently started on furosemide approximately a week ago and followed up with cardiology and had an echo done on Friday but has not heard the results.  She presented today due to worsening shortness of breath.  Patient's hemoglobin today is unchanged at 5.4 with a normal platelet count.  She does have a leukocytosis of 20,000.  Patient's CMP with chronically elevated LFTs which are not significantly different, creatinine of 1.94 and potassium of 5.3.  She was sinus tachycardia but no significant changes.  Chest x-ray, BNP and troponin pending.  Feel that patient has failed outpatient management and will  need admission for diuresis.  She was taking  daily.  Will give IV Lasix now.  3:15 PM Chest x-ray shows cardiomegaly and pulmonary vascular congestion, BNP is  elevated at 450 and troponin is within normal limits.  Will discuss with hospitalist for admission.  Final Clinical Impressions(s) / ED Diagnoses   Final diagnoses:  Acute on chronic congestive heart failure, unspecified heart failure type [facility])    ED Discharge Orders    None       Gwyneth Sprout, MD 07/09/18 1516

## 2018-07-09 NOTE — H&P (Signed)
History and Physical    Pamela Hudson EEF:[id number] DOB: 09/01/1985 DOA: 07/09/2018  PCP: Judd Lien, PA-C   Patient coming from: [facility]   Chief Complaint: Worsening dyspnea on exertion and shortness of breath with significant weight gain  HPI: Pamela Hudson is a 33 y.o. female with medical history significant of sickle cell disease hypertension known coronary artery disease.  Patient is status post acute MI.  Patient is seen cardiology with [facility].  Patient has a history in December 2019 of the having a 2D echo which revealed the patient to have a abnormal septal wall motion following an acute MI.  LV ejection fraction was 55 to 60%.  She is noted to have severe tricuspid regurgitation with moderate pulmonary hypertension.  Since February 17 the patient has noted increasing peripheral edema.  Her sickle cell team had started her on hydrochlorothiazide.  She never renewed this prescription.  Subsequently she saw her primary care PA and was restarted on hydrochlorothiazide due to significant and increasing peripheral edema.  Patient reports she continued to have increasing shortness of breath particularly with exertion.  Peripheral edema continued to worsen.  Primary care PA switched her from hydrochlorothiazide to Lasix 20 mg p.o. daily.  Of note the patient has gained weight going from 173 pounds in February 2022 223 pounds at admission.  Because of her increasing shortness of breath with dyspnea on exertion along with significant increased peripheral edema the patient reported to discount at [facility] for evaluation.  Felt to have significant symptoms of congestive heart failure with marked peripheral edema.    ED Course: At [facility] ED patient was given a single dose of IV Lasix.  Assessment reveals her to be cardiac stable with unremarkable cardiac enzymes.  Laboratory was unremarkable.  EKG without acute changes.  Because of her significant  fluid retention related to probable right-sided heart failure the patient's transfer transferred to [facility] hospital for IV diuresis and cardiology consult.  Review of Systems: As per HPI otherwise 10 point review of systems negative.  Of note patient denies any chest pain or chest discomfort.  She denies any significant wheezing.   Past Medical History:  Diagnosis Date  . Cardiomegaly   . Chronic pain   . CVA (cerebral infarction) 02/2002  . Hypertension   . Liver disease    ?iron  . PCOS (polycystic ovarian syndrome)   . Renal stone   . Sickle cell anemia (HCC)     Past Surgical History:  Procedure Laterality Date  . CHOLECYSTECTOMY    . LIVER BIOPSY  2007  . PARATHYROIDECTOMY    . PORTACATH PLACEMENT       reports that she has quit smoking. She has never used smokeless tobacco. She reports current alcohol use. She reports that she does not use drugs.  Allergies  Allergen Reactions  . Percocet [Oxycodone-Acetaminophen] Swelling    Swelling of the lip.    Family History  Problem Relation Age of Onset  . Diabetes Mellitus II Father   . CAD Father   . Diabetes Mellitus II Paternal Grandmother    Social history she is a single mother of an 86 year old son.  She lives with her son in their own apartment.  She has family nearby who assists her.  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  She seems to have an adequate support network.  She denies any danger of physical abuse.  Prior to Admission  medications   Medication Sig Start Date End Date Taking? Authorizing Provider  Deferasirox (JADENU) 360 MG TABS Take 720 mg by mouth daily.     [provider]  folic acid (FOLVITE) 1 MG tablet Take 1 mg by mouth daily. 11/21/15   [provider]  hydrochlorothiazide (HYDRODIURIL) 12.5 MG tablet Take 2 tablets (25 mg total) by mouth daily. 03/25/18   Rometta Emery, MD  hydroxyurea (HYDREA) 500 MG capsule Take 1,000 mg by mouth daily.      [provider]  magnesium oxide (MAG-OX) 400 MG tablet Take 400 mg by mouth daily. 01/26/18   [provider]  metoprolol succinate (TOPROL XL) 25 MG 24 hr tablet Take 0.5 tablets (12.5 mg total) by mouth daily. 11/29/15   Quentin Angst, MD  Vitamin D, Ergocalciferol, (DRISDOL) 1.25 MG (50000 UT) CAPS capsule Take 2 capsules by mouth every 7 (seven) days. 01/26/18   [provider]    Physical Exam: Vitals:   07/09/18 1310 07/09/18 1500 07/09/18 1600 07/09/18 1716  BP:  (!) 143/59 139/74 (!) 131/48  Pulse: (!) 109 (!) 109  (!) 109  Resp: 20 19 (!) 23 19  Temp:    98.4 F (36.9 C)  TempSrc:    Oral  SpO2: 100% 95%  100%  Weight: 102.3 kg   101.2 kg  Height:  (1.499 m)    (1.499 m)    Constitutional: NAD, calm, comfortable Vitals:   07/09/18 1310 07/09/18 1500 07/09/18 1600 07/09/18 1716  BP:  (!) 143/59 139/74 (!) 131/48  Pulse: (!) 109 (!) 109  (!) 109  Resp: 20 19 (!) 23 19  Temp:    98.4 F (36.9 C)  TempSrc:    Oral  SpO2: 100% 95%  100%  Weight: 102.3 kg   101.2 kg  Height:  (1.499 m)    (1.499 m)   General : Obese woman in no acute distress she is conversant and able to give a good history  eyes: PERRL,  conjunctivae normal, significant edema of the upper eyelids is noted. ENMT: Mucous membranes are moist. Posterior pharynx clear of any exudate or lesions.Normal dentition.  Neck: normal but thick, supple, no masses, no thyromegaly Respiratory: clear to auscultation bilaterally, no wheezing, no crackles. Normal respiratory effort at rest lying in bed at a 30 degree angle.. No accessory muscle use.  Cardiovascular: Regular tachycardia noted at a rate of about 110.  2/6 holosystolic murmur is noted best at the left sternal border.  Patient has a S2 gallop.  Rare premature beats are auscultated.Marland Kitchen  3+ pitting edema of the lower extremities is noted and extends above the knee.  6 cm of JVD is noted. No carotid bruits.   Abdomen: Obese, no tenderness, no masses palpated. No hepatosplenomegaly. Bowel sounds positive.  Musculoskeletal: no clubbing / cyanosis. No joint deformity upper and lower extremities. Good ROM, no contractures. Normal muscle tone.  Skin: no rashes, lesions, ulcers. No induration Neurologic: CN 2-12 grossly intact. Sensation intact, DTR normal. Strength 5/5 in all 4.  Psychiatric: Normal judgment and insight. Alert and oriented x 3. Normal mood.    Labs on Admission: I have personally reviewed following labs and imaging studies  CBC: Recent Labs  Lab 07/09/18 1409  WBC 20.7*  NEUTROABS 12.5*  HGB 5.4*  HCT 15.3*  MCV 121.4*  PLT 268   Basic Metabolic Panel: Recent Labs  Lab 07/09/18 1409  NA 132*  K 5.3*  CL  107  CO2 20*  GLUCOSE 141*  BUN 23*  CREATININE 0.94  CALCIUM 8.9   GFR: Estimated Creatinine Clearance: 90.9 mL/min (by C-G formula based on SCr of 0.94 mg/dL). Liver Function Tests: Recent Labs  Lab 07/09/18 1409  AST 108*  ALT 87*  ALKPHOS 122  BILITOT 9.6*  PROT 10.3*  ALBUMIN 3.5   No results for input(s): LIPASE, AMYLASE in the last 168 hours. No results for input(s): AMMONIA in the last 168 hours. Coagulation Profile: No results for input(s): INR, PROTIME in the last 168 hours. Cardiac Enzymes: Recent Labs  Lab 07/09/18 1409  TROPONINI <0.03   BNP (last 3 results) No results for input(s): PROBNP in the last 8760 hours. HbA1C: No results for input(s): HGBA1C in the last 72 hours. CBG: No results for input(s): GLUCAP in the last 168 hours. Lipid Profile: No results for input(s): CHOL, HDL, LDLCALC, TRIG, CHOLHDL, LDLDIRECT in the last 72 hours. Thyroid Function Tests: No results for input(s): TSH, T4TOTAL, FREET4, T3FREE, THYROIDAB in the last 72 hours. Anemia Panel: No results for input(s): VITAMINB12, FOLATE, FERRITIN, TIBC, IRON, RETICCTPCT in the last 72 hours. Urine analysis:    Component Value Date/Time   COLORURINE YELLOW  03/25/2018 0553   APPEARANCEUR CLEAR 03/25/2018 0553   LABSPEC 1.010 03/25/2018 0553   PHURINE 6.0 03/25/2018 0553   GLUCOSEU NEGATIVE 03/25/2018 0553   HGBUR SMALL (A) 03/25/2018 0553   BILIRUBINUR NEGATIVE 03/25/2018 0553   KETONESUR NEGATIVE 03/25/2018 0553   PROTEINUR NEGATIVE 03/25/2018 0553   UROBILINOGEN 0.2 05/15/2013 2130   NITRITE NEGATIVE 03/25/2018 0553   LEUKOCYTESUR NEGATIVE 03/25/2018 0553    Radiological Exams on Admission: Dg Chest 2 View  Result Date: 07/09/2018 CLINICAL DATA:  33 year old female with history of bilateral lower extremity edema and shortness of breath since March 2020. EXAM: CHEST - 2 VIEW COMPARISON:  Chest x-ray 02/16/2018. FINDINGS: Lung volumes are normal. No consolidative airspace disease. No pleural effusions. Severe pulmonary venous congestion, without frank pulmonary edema. Mild cardiomegaly. Upper mediastinal contours are within normal limits. Right internal jugular double-lumen power porta cath with tip terminating in the distal superior vena cava. IMPRESSION: 1. Cardiomegaly with pulmonary venous congestion, but no frank pulmonary edema. Electronically Signed   By: Trudie Reedaniel  Entrikin M.D.   On: 07/09/2018 14:51    EKG: Independently reviewed.  Sinus tachycardia is noted no acute changes are noted  Assessment/Plan Principal Problem:   Acute exacerbation of CHF (congestive heart failure) (HCC) Active Problems:   Sickle cell anemia (HCC)   Hyponatremia   Dyspnea   Essential hypertension   Anemia in other chronic diseases classified elsewhere   1.  CHF is presenting with significant right-sided heart failure with marked peripheral edema as well as JVD.  She is very large woman is hard to tell if she has anasarca.  Previous 2D echo December 2019 with a normal ejection fraction with abnormal septal wall motion.  And has severe tricuspid regurgitation and mild pulmonary hypertension.  Suspect this is the cause of her right-sided heart failure and  fluid retention.  Patient does have a history of CAD and a history of an acute MI. Plan telemetry admission  Diuresis using IV Lasix and will provide potassium replacement.  Cycle cardiac enzymes  Oxygen support  Cardiology consult-on-call cardiology notified  Repeat 2D echo if [facility] be obtained  2 hypertension blood pressure is adequately controlled at this time. Plan continue beta-blocker along with IV diuretics  3.  Anemia patient  with anemia most likely related to her sickle cell disease.  She reports she has frequent transfusions.  Her hemoglobin at this time is low but at her baseline.  Indication for transfusion noted.  4.  Electrolytes mild hyponatremia.  Potassium is normal.  Secondary to IV diuresis will start her on 20 mEq of potassium daily  DVT prophylaxis: Heparin (Lovenox/Heparin/SCD's/anticoagulated/None (if comfort care) Code Status: Full code (Full/Partial (specify details) Family Communication: Patient prefers to speak to her family (Specify name, relationship. Do not write "discussed with patient". Specify tel # if discussed over the phone) Disposition Plan: Discharge to home when stable (specify when and where you expect patient to be discharged) Consults called: Cardiology fellow on-call (with names) Admission status: Telemetry (inpatient / obs / tele / medical floor / SDU)   Illene Regulus MD [facility] Pager [phone]  If 7PM-7AM, please contact night-coverage [email or web address] Password [facility]  07/09/2018, 6:22 PM

## 2018-07-10 DIAGNOSIS — I509 Heart failure, unspecified: Secondary | ICD-10-CM

## 2018-07-10 DIAGNOSIS — D638 Anemia in other chronic diseases classified elsewhere: Secondary | ICD-10-CM

## 2018-07-10 DIAGNOSIS — I5033 Acute on chronic diastolic (congestive) heart failure: Secondary | ICD-10-CM

## 2018-07-10 DIAGNOSIS — R0600 Dyspnea, unspecified: Secondary | ICD-10-CM

## 2018-07-10 DIAGNOSIS — D571 Sickle-cell disease without crisis: Secondary | ICD-10-CM

## 2018-07-10 LAB — BASIC METABOLIC PANEL
Anion gap: 8 (ref 5–15)
BUN: 22 mg/dL — ABNORMAL HIGH (ref 6–20)
CO2: 20 mmol/L — ABNORMAL LOW (ref 22–32)
Calcium: 8.8 mg/dL — ABNORMAL LOW (ref 8.9–10.3)
Chloride: 106 mmol/L (ref 98–111)
Creatinine, Ser: 1.35 mg/dL — ABNORMAL HIGH (ref 0.44–1.00)
GFR calc Af Amer: >60 mL/min (ref >60)
GFR calc non Af Amer: 52 mL/min — ABNORMAL LOW (ref >60)
Glucose, Bld: 124 mg/dL — ABNORMAL HIGH (ref 70–99)
Potassium: 6 mmol/L — ABNORMAL HIGH (ref 3.5–5.1)
Sodium: 134 mmol/L — ABNORMAL LOW (ref 135–145)

## 2018-07-10 LAB — CBC
HCT: 14.8 % — ABNORMAL LOW (ref 36.0–46.0)
Hemoglobin: 5.3 g/dL — CL (ref 12.0–15.0)
MCH: 42.4 pg — ABNORMAL HIGH (ref 26.0–34.0)
MCHC: 35.8 g/dL (ref 30.0–36.0)
MCV: 118.4 fL — ABNORMAL HIGH (ref 80.0–100.0)
Platelets: 254 10*3/uL (ref 150–400)
RBC: 1.25 MIL/uL — ABNORMAL LOW (ref 3.87–5.11)
RDW: 19 % — ABNORMAL HIGH (ref 11.5–15.5)
WBC: 23.4 10*3/uL — ABNORMAL HIGH (ref 4.0–10.5)
nRBC: 3 % — ABNORMAL HIGH (ref 0.0–0.2)

## 2018-07-10 LAB — TROPONIN I
Troponin I: <0.03 ng/mL (ref <0.03)
Troponin I: <0.03 ng/mL (ref <0.03)
Troponin I: <0.03 ng/mL (ref <0.03)

## 2018-07-10 LAB — CREATININE, SERUM
Creatinine, Ser: 1.39 mg/dL — ABNORMAL HIGH (ref 0.44–1.00)
GFR calc Af Amer: 58 mL/min — ABNORMAL LOW (ref >60)
GFR calc non Af Amer: 50 mL/min — ABNORMAL LOW (ref >60)

## 2018-07-10 LAB — PREPARE RBC (CROSSMATCH)

## 2018-07-10 LAB — ABO/RH: ABO/RH(D): B POS

## 2018-07-10 MED ORDER — SODIUM CHLORIDE 0.9% FLUSH: 10.0000 mL | INTRAVENOUS | Status: DC | PRN

## 2018-07-10 MED ORDER — VITAMIN D (ERGOCALCIFEROL) 1.25 MG (50000 UNIT) PO CAPS: 100000.0000 [IU] | ORAL_CAPSULE | ORAL | Status: DC

## 2018-07-10 MED ORDER — DIPHENHYDRAMINE HCL 25 MG PO CAPS
25.0000 mg | ORAL_CAPSULE | Freq: Once | ORAL | Status: AC
  Administered 2018-07-10: 25 mg via ORAL
  Filled 2018-07-10: qty 1

## 2018-07-10 MED ORDER — SODIUM CHLORIDE 0.9% IV SOLUTION
Freq: Once | INTRAVENOUS | Status: AC
  Administered 2018-07-10: 12:00:00 via INTRAVENOUS

## 2018-07-10 MED ORDER — SODIUM CHLORIDE 0.9% FLUSH
10.0000 mL | Freq: Two times a day (BID) | INTRAVENOUS | Status: DC
  Administered 2018-07-10 – 2018-07-12 (×3): 10 mL

## 2018-07-10 NOTE — Progress Notes (Addendum)
Spoke with Pamela Hollis NP, pt's hemoglobin is at baseline and will receive blood transfusion if hemoglobin drops to 5.0.    IV team consult for port right chest access put in.  Hemoglobin and hematocrit labs to be done in the morning. Patient is currently sitting in bed watching television.

## 2018-07-10 NOTE — Progress Notes (Signed)
PRBC currently being administered per Erin Sons NP orders.   Hemoglobin and hematocrit labs in the morning.  Patient is currently lying in bed with eyes close.

## 2018-07-10 NOTE — Progress Notes (Signed)
Subjective: Pamela Hudson, a 33 year old female with a medical history significant for sickle cell disease, hypertension with known CAD, history of acute MI, right heart failure, chronic pain, history of CVA PCOS, and pulmonary hypertension was admitted with marked edema and recent 40 pound weight gain. Patient recently established care with [facility]. Patient's most recent 2D echo shows abnormal septal wall motion, severe tricuspid regurgitation and moderate pulmonary hypertension. LV ejection fraction was 55-60%. Patient was evaluated by PCP and prescribed diuretics for peripheral edema. Patient developed weight gain, significant peripheral edema and shortness of breath with exertion.    Today, patient's hemoglobin is 5.3. Reviewed previous hemotology notes,which show that baseline hemoglobin is around 8.5. She is followed by hematology at [facility]. Patient denies shortness of breath, dizziness, paresthesias, dysuria, nausea, vomiting, or diarrhea.  Patient also denies pain.  Oxygen saturation is 100% on RA.  Objective:  Vital signs in last 24 hours:  Vitals:   07/09/18 2001 07/10/18 0506 07/10/18 0852 07/10/18 1620  BP: 130/72 (!) 112/55 (!) 119/47 (!) 119/51  Pulse: (!) 106 100 100 (!) 104  Resp: 16 16 19 20   Temp: 99.2 F (37.3 C) 98.2 F (36.8 C) 98.4 F (36.9 C) 98.6 F (37 C)  TempSrc: Oral Oral Oral Oral  SpO2: 100% 96% 100% 100%  Weight:  101.2 kg    Height:        Intake/Output from previous day:   Intake/Output Summary (Last 24 hours) at 07/10/2018 1621 Last data filed at 07/10/2018 [id number] Gross per 24 hour  Intake 960 ml  Output 2200 ml  Net -1240 ml    Physical Exam Physical Exam Constitutional:      Appearance: She is well-developed. She is obese.  HENT:     Head: Normocephalic.  Eyes:     Pupils: Pupils are equal, round, and reactive to light.  Cardiovascular:     Rate and Rhythm: Tachycardia present.   Pulmonary:     Effort: Pulmonary effort is normal.     Breath sounds: Normal breath sounds.  Abdominal:     Palpations: Abdomen is soft.  Musculoskeletal:     Right lower leg: 2+ Pitting Edema present.     Left lower leg: 2+ Pitting Edema present.  Skin:    General: Skin is warm and dry.  Neurological:     General: No focal deficit present.     Mental Status: She is alert and oriented to person, place, and time.  Psychiatric:        Mood and Affect: Mood normal.        Behavior: Behavior normal.     Lab Results:  Basic Metabolic Panel:    Component Value Date/Time   NA 134 (L) 07/10/2018 0705   K 6.0 (H) 07/10/2018 0705   CL 106 07/10/2018 0705   CO2 20 (L) 07/10/2018 0705   BUN 22 (H) 07/10/2018 0705   CREATININE 1.35 (H) 07/10/2018 0705   GLUCOSE 124 (H) 07/10/2018 0705   CALCIUM 8.8 (L) 07/10/2018 0705   CALCIUM 8.4 (L) 03/25/2018 0457   CBC:    Component Value Date/Time   WBC 23.4 (H) 07/10/2018 0231   HGB 5.3 (LL) 07/10/2018 0231   HCT 14.8 (L) 07/10/2018 0231   PLT 254 07/10/2018 0231   MCV 118.4 (H) 07/10/2018 0231   NEUTROABS 12.5 (H) 07/09/2018 1409   LYMPHSABS 5.4 (H) 07/09/2018 1409   MONOABS 2.1 (H) 07/09/2018 1409   EOSABS  0.4 07/09/2018 1409   BASOSABS 0.1 07/09/2018 1409    No results found for this or any previous visit (from the past 240 hour(s)).  Studies/Results: Dg Chest 2 View  Result Date: 07/09/2018 CLINICAL DATA:  33 year old female with history of bilateral lower extremity edema and shortness of breath since March 2020. EXAM: CHEST - 2 VIEW COMPARISON:  Chest x-ray 02/16/2018. FINDINGS: Lung volumes are normal. No consolidative airspace disease. No pleural effusions. Severe pulmonary venous congestion, without frank pulmonary edema. Mild cardiomegaly. Upper mediastinal contours are within normal limits. Right internal jugular double-lumen power porta cath with tip terminating in the distal superior vena cava. IMPRESSION: 1. Cardiomegaly  with pulmonary venous congestion, but no frank pulmonary edema. Electronically Signed   By: Trudie Reed M.D.   On: 07/09/2018 14:51    Medications: Scheduled Meds: . Deferasirox  720 mg Oral Daily  . enoxaparin (LOVENOX) injection  40 mg Subcutaneous Daily  . folic acid  1 mg Oral Daily  . furosemide  40 mg Intravenous Q12H  . hydroxyurea  1,000 mg Oral Daily  . magnesium oxide  400 mg Oral Daily  . metoprolol succinate  12.5 mg Oral Daily  . potassium chloride  20 mEq Oral Daily  . [START ON 07/16/2018] Vitamin D (Ergocalciferol)  100,000 Units Oral Q Sun   Continuous Infusions: PRN Meds:.  Consultants:  Cardiology   Assessment/Plan: Principal Problem:   Acute exacerbation of CHF (congestive heart failure) (HCC) Active Problems:   Sickle cell anemia (HCC)   Hyponatremia   Dyspnea   Essential hypertension   Anemia in other chronic diseases classified elsewhere  CHF with right-sided heart failure: Daily weights Strict I's and O's Follow electrolytes Continue HCTZ 25 mg daily IV furosemide every 12 hours per cardiology Toprol XL 25 mg daily Cardiology input appreciated.  Will defer to cardiology for further work-up and evaluation  Symptomatic anemia: Patient's hemoglobin 5.3.  On review of hemat[facility].  Baseline hemoglobin is 8.5 g/dL.  Will transfuse 1 unit of packed red blood cells over 4 hours. Continue to follow CBC in a.m.  Sickle cell anemia, hemoglobin SS: Continue folic acid 1 mg daily Patient denies pain related to sickle cell disease at this time  Hypokalemia: Potassium 6.0, repeat BMP in a.m.  Chronic kidney disease stage II: Creatinine 1.35, which is increased from baseline of 1.0. Avoid all nephrotoxic medications Follow creatinine   Code Status: Full Code Family Communication: N/A Disposition Plan: Not yet ready for discharge   Nolon Nations  APRN, MSN, FNP-C [facility] [address] [place], [place] [address] [phone]  If 5PM-7AM, please contact night-coverage.  07/10/2018, 4:21 PM  LOS: 1 day

## 2018-07-10 NOTE — Progress Notes (Signed)
Per Patient: takes Deferasirox TABS 720mg   Daily at home for her sickle cell. Patient was not able to get someone to bring this medication to the hospital.  Will let MD aware.

## 2018-07-10 NOTE — Progress Notes (Signed)
RN received critical Hbg of 5.3 by [facility].  RN paged Internal Medicine of critical lab.  No response.  AD paged Internal Medicine and was notified Mickel Fuchs, RN that pt belongs to [facility].  Information has been reported to dayshift nurse of critical value.Neta Ehlers, RN)

## 2018-07-10 NOTE — Progress Notes (Signed)
Paged  Pamela Hollis NP from sickle cell and spoke with her regarding pt's hemoglobin level. NP will come and assess pt.  Patient is currently lying in bed with eyes close, pt states : " I barely got any sleep last night, so am sleepy right now".  Secured chat sent to [facility] MD.

## 2018-07-11 DIAGNOSIS — E871 Hypo-osmolality and hyponatremia: Secondary | ICD-10-CM

## 2018-07-11 DIAGNOSIS — I10 Essential (primary) hypertension: Secondary | ICD-10-CM

## 2018-07-11 LAB — COMPREHENSIVE METABOLIC PANEL
ALT: 84 U/L — ABNORMAL HIGH (ref 0–44)
AST: 101 U/L — ABNORMAL HIGH (ref 15–41)
Albumin: 3 g/dL — ABNORMAL LOW (ref 3.5–5.0)
Alkaline Phosphatase: 130 U/L — ABNORMAL HIGH (ref 38–126)
Anion gap: 8 (ref 5–15)
BUN: 23 mg/dL — ABNORMAL HIGH (ref 6–20)
CO2: 21 mmol/L — ABNORMAL LOW (ref 22–32)
Calcium: 8.8 mg/dL — ABNORMAL LOW (ref 8.9–10.3)
Chloride: 104 mmol/L (ref 98–111)
Creatinine, Ser: 1.07 mg/dL — ABNORMAL HIGH (ref 0.44–1.00)
GFR calc Af Amer: >60 mL/min (ref >60)
GFR calc non Af Amer: >60 mL/min (ref >60)
Glucose, Bld: 102 mg/dL — ABNORMAL HIGH (ref 70–99)
Potassium: 5.7 mmol/L — ABNORMAL HIGH (ref 3.5–5.1)
Sodium: 133 mmol/L — ABNORMAL LOW (ref 135–145)
Total Bilirubin: 9.3 mg/dL — ABNORMAL HIGH (ref 0.3–1.2)
Total Protein: 9.6 g/dL — ABNORMAL HIGH (ref 6.5–8.1)

## 2018-07-11 LAB — CBC WITH DIFFERENTIAL/PLATELET
Abs Immature Granulocytes: 0.22 10*3/uL — ABNORMAL HIGH (ref 0.00–0.07)
Basophils Absolute: 0.2 10*3/uL — ABNORMAL HIGH (ref 0.0–0.1)
Basophils Relative: 1 %
Eosinophils Absolute: 0.5 10*3/uL (ref 0.0–0.5)
Eosinophils Relative: 2 %
HCT: 16.7 % — ABNORMAL LOW (ref 36.0–46.0)
Hemoglobin: 6 g/dL — CL (ref 12.0–15.0)
Immature Granulocytes: 1 %
Lymphocytes Relative: 27 %
Lymphs Abs: 6.8 10*3/uL — ABNORMAL HIGH (ref 0.7–4.0)
MCH: 40.8 pg — ABNORMAL HIGH (ref 26.0–34.0)
MCHC: 35.9 g/dL (ref 30.0–36.0)
MCV: 113.6 fL — ABNORMAL HIGH (ref 80.0–100.0)
Monocytes Absolute: 2.4 10*3/uL — ABNORMAL HIGH (ref 0.1–1.0)
Monocytes Relative: 9 %
Neutro Abs: 14.8 10*3/uL — ABNORMAL HIGH (ref 1.7–7.7)
Neutrophils Relative %: 60 %
Platelets: 244 10*3/uL (ref 150–400)
RBC: 1.47 MIL/uL — ABNORMAL LOW (ref 3.87–5.11)
RDW: 21.5 % — ABNORMAL HIGH (ref 11.5–15.5)
WBC: 24.9 10*3/uL — ABNORMAL HIGH (ref 4.0–10.5)
nRBC: 2.3 % — ABNORMAL HIGH (ref 0.0–0.2)

## 2018-07-11 LAB — BPAM RBC
Blood Product Expiration Date: 202005172359
ISSUE DATE / TIME: 202004271734
Unit Type and Rh: 5100

## 2018-07-11 LAB — TYPE AND SCREEN
ABO/RH(D): B POS
Antibody Screen: NEGATIVE
Unit division: 0

## 2018-07-11 LAB — HEMOGLOBIN AND HEMATOCRIT, BLOOD
HCT: 17.2 % — ABNORMAL LOW (ref 36.0–46.0)
Hemoglobin: 6.1 g/dL — CL (ref 12.0–15.0)

## 2018-07-11 NOTE — Progress Notes (Signed)
Pt and pt family upset that cardiology has not seen this pt. Pt states "I have been in the hospital since the 26th and was supposed to be seen by cardiology on the 27th and still no one has came." Pt would like to speak to cardiologist tomorrow.

## 2018-07-11 NOTE — Progress Notes (Signed)
Pt refusing bed alarm.

## 2018-07-11 NOTE — Plan of Care (Signed)

## 2018-07-11 NOTE — Progress Notes (Signed)
Patient Hgb- 6.0, K was 5.7.  Paged MD NP is aware of labs-  Will consult cardiology.  Concern for fluid overload - no complaint of SOB, CP, or dizziness

## 2018-07-11 NOTE — Progress Notes (Addendum)
Pt hemoglobin is 6.0. [facility] on-call paged

## 2018-07-11 NOTE — Progress Notes (Signed)
Subjective: Pamela Hudson, a 33 year old female with a medical history significant for sickle cell disease, hypertension with known CAD, history of acute MI, right sided heart failure, chronic pain syndrome, history of CVA, PCOS, and history of pulmonary hypertension was admitted with marked edema and recent 40 pound weight gain.  On admission, hemoglobin 5.3, patient transfused 1 unit of PRBCs.  Hemoglobin 6.1 on today, which is consistent with patient's baseline.  Patient continues to complain of "tightness" to lower extremities, along with swelling.  Swelling does not interfere with ambulation.  Patient denies pain.  She also denies shortness of breath, dizziness, paresthesias, dysuria, nausea, vomiting, or diarrhea. Patient is afebrile and maintaining oxygen saturation at 99% on RA.  Objective:  Vital signs in last 24 hours:  Vitals:   07/10/18 2321 07/11/18 0544 07/11/18 0812 07/11/18 1239  BP: 105/64 (!) 106/37 120/61 119/62  Pulse: 100 97 (!) 106 (!) 103  Resp: 18 18 19 18   Temp: 98.4 F (36.9 C) 98.3 F (36.8 C) 99.3 F (37.4 C) 98.3 F (36.8 C)  TempSrc: Oral Oral Oral Oral  SpO2: 100% 98% 99% 100%  Weight:  100.2 kg    Height:        Intake/Output from previous day:   Intake/Output Summary (Last 24 hours) at 07/11/2018 1331 Last data filed at 07/11/2018 1236 Gross per 24 hour  Intake 1352.08 ml  Output 1300 ml  Net 52.08 ml   Physical Exam Constitutional:      Appearance: She is obese.  Eyes:     Pupils: Pupils are equal, round, and reactive to light.  Cardiovascular:     Rate and Rhythm: Tachycardia present.     Heart sounds: Normal heart sounds.  Pulmonary:     Effort: No tachypnea.     Breath sounds: No decreased breath sounds, wheezing or rhonchi.  Musculoskeletal:     Right lower leg: 3+ Pitting Edema present.     Left lower leg: 3+ Pitting Edema present.  Neurological:     General: No focal deficit present.     Mental Status: She is alert and  oriented to person, place, and time.  Psychiatric:        Mood and Affect: Mood normal. Mood is not anxious.        Behavior: Behavior normal.     Lab Results:  Basic Metabolic Panel:    Component Value Date/Time   NA 133 (L) 07/11/2018 0328   K 5.7 (H) 07/11/2018 0328   CL 104 07/11/2018 0328   CO2 21 (L) 07/11/2018 0328   BUN 23 (H) 07/11/2018 0328   CREATININE 1.07 (H) 07/11/2018 0328   GLUCOSE 102 (H) 07/11/2018 0328   CALCIUM 8.8 (L) 07/11/2018 0328   CALCIUM 8.4 (L) 03/25/2018 0457   CBC:    Component Value Date/Time   WBC 24.9 (H) 07/11/2018 0328   HGB 6.0 (LL) 07/11/2018 0328   HCT 16.7 (L) 07/11/2018 0328   PLT 244 07/11/2018 0328   MCV 113.6 (H) 07/11/2018 0328   NEUTROABS 14.8 (H) 07/11/2018 0328   LYMPHSABS 6.8 (H) 07/11/2018 0328   MONOABS 2.4 (H) 07/11/2018 0328   EOSABS 0.5 07/11/2018 0328   BASOSABS 0.2 (H) 07/11/2018 0328    No results found for this or any previous visit (from the past 240 hour(s)).  Studies/Results: Dg Chest 2 View  Result Date: 07/09/2018 CLINICAL DATA:  33 year old female with history of bilateral lower extremity edema and shortness of breath since March 2020. EXAM: CHEST -  2 VIEW COMPARISON:  Chest x-ray 02/16/2018. FINDINGS: Lung volumes are normal. No consolidative airspace disease. No pleural effusions. Severe pulmonary venous congestion, without frank pulmonary edema. Mild cardiomegaly. Upper mediastinal contours are within normal limits. Right internal jugular double-lumen power porta cath with tip terminating in the distal superior vena cava. IMPRESSION: 1. Cardiomegaly with pulmonary venous congestion, but no frank pulmonary edema. Electronically Signed   By: Trudie Reedaniel  Entrikin M.D.   On: 07/09/2018 14:51    Medications: Scheduled Meds: . Deferasirox  720 mg Oral Daily  . enoxaparin (LOVENOX) injection  40 mg Subcutaneous Daily  . folic acid  1 mg Oral Daily  . furosemide  40 mg Intravenous Q12H  . hydroxyurea  1,000 mg  Oral Daily  . magnesium oxide  400 mg Oral Daily  . metoprolol succinate  12.5 mg Oral Daily  . potassium chloride  20 mEq Oral Daily  . sodium chloride flush  10-40 mL Intracatheter Q12H  . [START ON 07/16/2018] Vitamin D (Ergocalciferol)  100,000 Units Oral Q Sun   Continuous Infusions: PRN Meds:.sodium chloride flush  Consultants:  Cardiology  Assessment/Plan: Principal Problem:   Acute exacerbation of CHF (congestive heart failure) (HCC) Active Problems:   Sickle cell anemia (HCC)   Hyponatremia   Dyspnea   Essential hypertension   Anemia in other chronic diseases classified elsewhere  CHF with right-sided heart failure: Continue daily weights Strict I's and O's Follow electrolytes IV furosemide every 12 hours per cardiology Toprol XL 25 mg daily Cardiology service is contacted, input appreciated. Dr. Sherilyn BankerJay Gangi, [facility] to evaluate. Patient underwent 2D echocardiogram on 07/07/2018, results pending.  Anemia of chronic disease: Current hemoglobin 6.0, stable.  Continue to follow closely.  Sickle cell anemia, type SS: Continue folic acid 1 mg daily Patient is not in sickle cell crisis.  She currently denies pain  Chronic kidney disease: Creatinine improved.  Continue to follow closely. Avoid all nephrotoxic medications Code Status: Full Code Family Communication: N/A Disposition Plan: Not yet ready for discharge   Nolon NationsLachina Moore Welborn Keena  APRN, MSN, FNP-C [facility] [address]  , [place] [address] [phone]  If 7PM-7AM, please contact night-coverage.  07/11/2018, 1:31 PM  LOS: 2 days

## 2018-07-11 NOTE — Progress Notes (Signed)
Pt hemoglobin is 6.0. Sickle Cell MD notified.

## 2018-07-12 ENCOUNTER — Other Ambulatory Visit: Payer: Self-pay | Admitting: Family Medicine

## 2018-07-12 DIAGNOSIS — I272 Pulmonary hypertension, unspecified: Secondary | ICD-10-CM

## 2018-07-12 LAB — BASIC METABOLIC PANEL
Anion gap: 10 (ref 5–15)
BUN: 26 mg/dL — ABNORMAL HIGH (ref 6–20)
CO2: 22 mmol/L (ref 22–32)
Calcium: 9 mg/dL (ref 8.9–10.3)
Chloride: 102 mmol/L (ref 98–111)
Creatinine, Ser: 1.12 mg/dL — ABNORMAL HIGH (ref 0.44–1.00)
GFR calc Af Amer: >60 mL/min (ref >60)
GFR calc non Af Amer: >60 mL/min (ref >60)
Glucose, Bld: 107 mg/dL — ABNORMAL HIGH (ref 70–99)
Potassium: 5.3 mmol/L — ABNORMAL HIGH (ref 3.5–5.1)
Sodium: 134 mmol/L — ABNORMAL LOW (ref 135–145)

## 2018-07-12 LAB — CBC
HCT: 16.9 % — ABNORMAL LOW (ref 36.0–46.0)
Hemoglobin: 6 g/dL — CL (ref 12.0–15.0)
MCH: 39.7 pg — ABNORMAL HIGH (ref 26.0–34.0)
MCHC: 35.5 g/dL (ref 30.0–36.0)
MCV: 111.9 fL — ABNORMAL HIGH (ref 80.0–100.0)
Platelets: 268 10*3/uL (ref 150–400)
RBC: 1.51 MIL/uL — ABNORMAL LOW (ref 3.87–5.11)
RDW: 22 % — ABNORMAL HIGH (ref 11.5–15.5)
WBC: 20.6 10*3/uL — ABNORMAL HIGH (ref 4.0–10.5)
nRBC: 2.4 % — ABNORMAL HIGH (ref 0.0–0.2)

## 2018-07-12 MED ORDER — FUROSEMIDE 20 MG PO TABS: 40.0000 mg | ORAL_TABLET | Freq: Two times a day (BID) | ORAL | 0 refills | Status: DC

## 2018-07-12 MED ORDER — HEPARIN SOD (PORK) LOCK FLUSH 100 UNIT/ML IV SOLN
500.0000 [IU] | INTRAVENOUS | Status: AC | PRN
  Administered 2018-07-12: 15:00:00 500 [IU]

## 2018-07-12 NOTE — Progress Notes (Signed)
Pt dc home in stable condition via wheelchair, iv team dc port and dressing intact, reviewed AVS and all questions entertained and answered, pt has already contacted MDs on follow up for appt

## 2018-07-12 NOTE — Discharge Instructions (Signed)
Pulmonary Hypertension °Pulmonary hypertension is a long-term (chronic) condition in which there is high blood pressure in the arteries in the lungs (pulmonary arteries). This condition occurs when pulmonary arteries become narrow and tight, making it harder for blood to flow through the lungs. This in turn makes the heart work harder to pump blood through the lungs, making it harder for you to breathe. °Over time, pulmonary hypertension can weaken and damage the heart muscle, specifically the right side of the heart. Pulmonary hypertension is a serious condition that can be life-threatening. °What are the causes? °This condition may be caused by different medical conditions. It can be categorized by cause into five groups: °· Group 1: Pulmonary hypertension that is caused by abnormal growth of small blood vessels in the lungs (pulmonary arterial hypertension). The abnormal blood vessel growth may have no known cause, or it may be: °? Passed from parent to child (hereditary). °? Caused by another disease, such as a connective tissue disease (including lupus or scleroderma), congenital heart disease, liver disease, or HIV. °? Caused by certain medicines or poisons (toxins). °· Group 2: Pulmonary hypertension that is caused by weakness of the left chamber of the heart (left ventricle) or heart valve disease. °· Group 3: Pulmonary hypertension that is caused by lung disease or low oxygen levels. Causes in this group include: °? Emphysema or chronic obstructive pulmonary disease (COPD). °? Untreated sleep apnea. °? Pulmonary fibrosis. °? Long-term exposure to high altitudes in certain people who may already be at higher risk for pulmonary hypertension. °· Group 4: Pulmonary hypertension that is caused by blood clots in the lungs (pulmonary emboli). °· Group 5: Other causes of pulmonary hypertension, such as sickle cell anemia, sarcoidosis, tumors pressing on the pulmonary arteries, and various other diseases. °What are  the signs or symptoms? °Symptoms of this condition include: °· Shortness of breath. You may notice shortness of breath with: °? Activity, such as walking. °? Minimal activity, such as getting dressed. °? No activity, like when you are sitting still. °· A cough. Sometimes, bloody mucus from the lungs may be coughed up (hemoptysis). °· Tiredness and fatigue. °· Dizziness, lightheadedness, or fainting, especially with physical activity. °· Rapid heartbeat, or feeling your heart flutter or skip a beat (palpitations). °· Veins in the neck getting larger. °· Swelling of the lower legs, abdomen, or both. °· Bluish color of the lips and fingertips. °· Chest pain or tightness in the chest. °· Abdominal pain, especially in the upper abdomen. °How is this diagnosed? °This condition may be diagnosed based on one or more of the following tests: °· Chest X-ray. °· Blood tests. °· CT scan. °· Pulmonary function test. This test measures how much air your lungs can hold. It also tests how well air moves in and out of your lungs. °· 6-minute walk test. This tests how severe your condition is in relation to your activity levels. °· Electrocardiogram (ECG). This test records the electrical impulses of the heart. °· Echocardiogram. This test uses sound waves (ultrasound) to produce an image of the heart. °· Cardiac catheterization. This is a procedure in which a thin tube (catheter) is passed into the pulmonary artery and used to test the pressure in your pulmonary artery and the right side of your heart. °· Lung biopsy. This involves having a procedure to remove a small sample of lung tissue for testing. This may help determine an underlying cause of your pulmonary hypertension. °How is this treated? °There is no cure for   this condition, but treatment can help to relieve symptoms and slow the progress of the condition. Treatment may include:  Cardiac rehabilitation. This is a treatment program that includes exercise training,  education, and counseling to help you get stronger and return to an active lifestyle.  Oxygen therapy.  Medicines that: ? Lower blood pressure. ? Relax (dilate) the pulmonary blood vessels. ? Help the heart beat more efficiently and pump more blood. ? Help the body get rid of extra fluid (diuretics). ? Thin the blood in order to prevent blood clots in the lungs.  Lung surgery to relieve pressure on the heart, for severe cases that do not respond to medical treatment.  Heart-lung transplant, or lung transplant. This may be done in very severe cases. Follow these instructions at home: Eating and drinking   Eat a healthy diet that includes plenty of fresh fruits and vegetables, whole grains, and beans.  Limit your salt (sodium) intake to less than 2,300 mg a day. Lifestyle  Do not use any products that contain nicotine or tobacco, such as cigarettes and e-cigarettes. If you need help quitting, ask your health care provider.  Avoid secondhand smoke. Activity  Get plenty of rest.  Exercise as directed. Talk with your health care provider about what type of exercise is safe for you.  Avoid hot tubs and saunas.  Avoid high altitudes. General instructions  Take over-the-counter and prescription medicines only as told by your health care provider. Do not change or stop medicines without checking with your health care provider.  Stay up to date on your vaccines, especially yearly flu (influenza) and pneumonia vaccines.  If you are a woman of child-bearing age, avoid becoming pregnant. Talk with your health care provider about birth control.  Consider ways to get support for anxiety and stress of living with pulmonary hypertension. Talk with your health care provider about support groups and online resources.  Use oxygen therapy at home as directed.  Keep track of your weight. Weight gain could be a sign that your condition is getting worse.  Keep all follow-up visits as told by  your health care provider. This is important. Contact a health care provider if:  Your cough gets worse.  You have more shortness of breath than usual, or you start to have trouble doing activities that you could do before.  You need to use medicines or oxygen more frequently or in higher dosages than usual. Get help right away if:  You have severe shortness of breath.  You have chest pain or pressure.  You cough up blood.  You have swelling of your feet or legs that gets worse.  You have rapid weight gain over a period of 1-2 days.  Your medicines or oxygen do not provide relief. Summary  Pulmonary hypertension is a chronic condition in which there is high blood pressure in the arteries in the lungs (pulmonary arteries).  Pulmonary hypertension is a serious condition that can be life-threatening. It can be caused by a variety of illnesses.  Treatment may involve taking medicines and using oxygen therapy. Severe cases may require surgery or a transplant. This information is not intended to replace advice given to you by your health care provider. Make sure you discuss any questions you have with your health care provider. Document Released: 12/27/2006 Document Revised: 05/25/2016 Document Reviewed: 05/25/2016 Elsevier Interactive Patient Education  2019 [employer]   Heart Failure  Heart failure means your heart has trouble pumping blood. This makes it hard  for your body to work well. Heart failure is usually a long-term (chronic) condition. You must take good care of yourself and follow your treatment plan from your doctor. Follow these instructions at home: Medicines  Take over-the-counter and prescription medicines only as told by your doctor. ? Do not stop taking your medicine unless your doctor told you to do that. ? Do not skip any doses. ? Refill your prescriptions before you run out of medicine. You need your medicines every day. Eating and drinking   Eat  heart-healthy foods. Talk with a diet and nutrition specialist (dietitian) to make an eating plan.  Choose foods that: ? Have no trans fat. ? Are low in saturated fat and cholesterol.  Choose healthy foods, like: ? Fresh or frozen fruits and vegetables. ? Fish. ? Low-fat (lean) meats. ? Legumes (like beans, peas, and lentils). ? Fat-free or low-fat dairy products. ? Whole-grain foods. ? High-fiber foods.  Limit salt (sodium) if told by your doctor. Ask your nutrition specialist to recommend heart-healthy seasonings.  Cook in healthy ways instead of frying. Healthy ways of cooking include: ? Roasting. ? Grilling. ? Broiling. ? Baking. ? Poaching. ? Steaming. ? Stir-frying.  Limit how much fluid you drink, if told by your doctor. Lifestyle  Do not smoke or use chewing tobacco. Do not use nicotine gum or patches before talking to your doctor.  Limit alcohol intake to no more than 1 drink a day for non-pregnant women and 2 drinks a day for men. One drink equals 12 oz of beer, 5 oz of wine, or 1 oz of hard liquor. ? Tell your doctor if you drink alcohol many times a week. ? Talk with your doctor about whether any alcohol is safe for you. ? You should stop drinking alcohol: ? If your heart has been damaged by alcohol. ? You have very bad heart failure.  Do not use illegal drugs.  Lose weight if told by your doctor.  Do moderate physical activity if told by your doctor. Ask your doctor what activities are safe for you if: ? You are of older age (elderly). ? You have very bad heart failure. Keep track of important information  Weigh yourself every day. ? Weigh yourself every morning after you pee (urinate) and before breakfast. ? Wear the same amount of clothing each time. ? Write down your daily weight. Give your record to your doctor.  Check and write down your blood pressure as told by your doctor.  Check your pulse as told by your doctor. Dealing with heat and  cold  If the weather is very hot: ? Avoid activity that takes a lot of energy. ? Use air conditioning or fans, or find a cooler place. ? Avoid caffeine. ? Avoid alcohol. ? Wear clothing that is loose-fitting, lightweight, and light-colored.  If the weather is very cold: ? Avoid activity that takes a lot of energy. ? Layer your clothes. ? Wear mittens or gloves, a hat, and a scarf when you go outside. ? Avoid alcohol. General instructions  Manage other conditions that you have as told by your doctor.  Learn to manage stress. If you need help, ask your doctor.  Plan rest periods for when you get tired.  Get education and support as needed.  Get rehab (rehabilitation) to help you stay independent and to help with everyday tasks.  Stay up to date with shots (immunizations), especially pneumococcal and flu (influenza) shots.  Keep all follow-up visits as told by  your doctor. This is important. Contact a doctor if:  You gain weight quickly.  You are more short of breath than normal.  You cannot do your normal activities.  You tire easily.  You cough more than normal, especially with activity.  You have any or more puffiness (swelling) in areas such as your hands, feet, ankles, or belly (abdomen).  You cannot sleep because it is hard to breathe.  You feel like your heart is beating fast (palpitations).  You get dizzy or light-headed when you stand up. Get help right away if:  You have trouble breathing.  You or someone else notices a change in your awareness. This could be trouble staying awake or trouble concentrating.  You have chest pain or discomfort.  You pass out (faint). Summary  Heart failure means your heart has trouble pumping blood.  Make sure you refill your prescriptions before you run out of medicine. You need your medicines every day.  Keep records of your weight and blood pressure to give to your doctor.  Contact a doctor if you gain weight  quickly. This information is not intended to replace advice given to you by your health care provider. Make sure you discuss any questions you have with your health care provider. Document Released: 12/09/2007 Document Revised: 11/23/2017 Document Reviewed: 03/23/2016 Elsevier Interactive Patient Education  2019 [employer].

## 2018-07-12 NOTE — Progress Notes (Signed)
Called CCMD to notify pt off tele for shower

## 2018-07-12 NOTE — Progress Notes (Signed)
Subjective:  Still has leg swelling, abdominal swelling, and shortness of breath  Objective:  Vital Signs in the last 24 hours: Temp:  [98.1 F (36.7 C)-98.3 F (36.8 C)] 98.1 F (36.7 C) (04/29 0411) Pulse Rate:  [100-103] 100 (04/29 0411) Resp:  [18] 18 (04/29 0411) BP: (106-131)/(50-74) 131/74 (04/29 0411) SpO2:  [100 %] 100 % (04/29 0411) Weight:  [95.3 kg] 95.3 kg (04/29 0411)  Intake/Output from previous day: 04/28 0701 - 04/29 0700 In: 1450 [P.O.:1440; I.V.:10] Out: 3400 [Urine:3400]  Physical Exam  Constitutional: She is oriented to person, place, and time. She appears well-developed and well-nourished. No distress.  HENT:  Head: Normocephalic and atraumatic.  Eyes: Pupils are equal, round, and reactive to light. Conjunctivae are normal.  Neck: JVD present.  Cardiovascular: Normal rate, regular rhythm and intact distal pulses.  Loud P2  Pulmonary/Chest: Effort normal and breath sounds normal. She has no wheezes. She has no rales.  Abdominal: Soft. Bowel sounds are normal. She exhibits distension. There is no rebound.  Musculoskeletal:        General: Edema (2+ b/l) present.  Lymphadenopathy:    She has no cervical adenopathy.  Neurological: She is alert and oriented to person, place, and time. No cranial nerve deficit.  Skin: Skin is warm and dry.  Psychiatric: She has a normal mood and affect.  Nursing note and vitals reviewed.    Lab Results: BMP Recent Labs    07/10/18 0705 07/11/18 0328 07/12/18 0738  NA 134* 133* 134*  K 6.0* 5.7* 5.3*  CL 106 104 102  CO2 20* 21* 22  GLUCOSE 124* 102* 107*  BUN 22* 23* 26*  CREATININE 1.35* 1.07* 1.12*  CALCIUM 8.8* 8.8* 9.0  GFRNONAA 52* >60 >60  GFRAA >60 >60 >60    CBC Recent Labs  Lab 07/11/18 0328  WBC 24.9*  RBC 1.47*  HGB 6.0*  HCT 16.7*  PLT 244  MCV 113.6*  MCH 40.8*  MCHC 35.9  RDW 21.5*  LYMPHSABS 6.8*  MONOABS 2.4*  EOSABS 0.5  BASOSABS 0.2*    HEMOGLOBIN A1C No results found  for: HGBA1C, MPG  Cardiac Panel (last 3 results) Recent Labs    07/09/18 1957 07/10/18 0231 07/10/18 0705 07/10/18 1322  CKTOTAL 33*  --   --   --   CKMB 0.8  --   --   --   TROPONINI  --  <0.03 <0.03 <0.03  RELINDX RELATIVE INDEX IS INVALID  --   --   --     BNP (last 3 results) Recent Labs    02/16/18 1825 03/25/18 1223 07/09/18 1409  BNP 303.2* 242.8* 449.3*    TSH Recent Labs    02/18/18 0435  TSH 5.241*    Lipid Panel     Component Value Date/Time   CHOL 87 02/17/2018 1609   TRIG 87 02/17/2018 1609   HDL <10 (L) 02/17/2018 1609   CHOLHDL NOT CALCULATED 02/17/2018 1609   VLDL 17 02/17/2018 1609   LDLCALC NOT CALCULATED 02/17/2018 1609     Hepatic Function Panel Recent Labs    02/17/18 0717  03/25/18 0005 07/09/18 1409 07/11/18 0328  PROT 8.6*   < > 9.1* 10.3* 9.6*  ALBUMIN 3.2*   < > 3.4* 3.5 3.0*  AST 110*   < > 124* 108* 101*  ALT 77*   < > 98* 87* 84*  ALKPHOS 117   < > 146* 122 130*  BILITOT 9.7*   < > 7.3* 9.6* 9.3*  BILIDIR 3.2*  --   --   --   --   IBILI 6.5*  --   --   --   --    < > = values in this interval not displayed.    Cardiac Studies:  EKG 07/09/2018: Sinus tachycardia 115 bpm.Right axis deviation. Low voltage  Vascular US 02/17/2018: No DVT  Echocardiogram 02/17/2018: LVEF 55-60% Mild MR, severe TR. PASP 56 mmHg  Assessment & Recommendations:  33 y/o [place] female with Sickle cell disease, hypertension, acute on chronic right sided heart failure, pulmonary hypertension.  Right heart failure, pulmonary hypertension: Most likely, this is related to her sickle cell disease. I agree that she will need pulmonary evaluation, VQ scan, right and left heart catheterization. She recently established care with Dr[facility] Uval[facility] [place], who had planned on performing this workup outpatient. She recently underwent echocardiogram with him. I do not have those results available to me at  this time. She will likely need to be evaluated at an academic center by pulmonary hypertension specialist who have expertise of treating patients with sickle cell disease. I have discussed this with the patient and she would like to conitnue outpatient workup with Dr. Uvaldo Risingohrback at this time. In the meantime, continue IV lasix 40 mg bid, as you are doing. His could be potentially switched to PO torsemide 20 mg bid on 07/13/2018 in anticipation of discharge.  I do not think her event in 02/2018 was a myocardial infarction. ST elevations were transient, but could possible have been related to spasm or sickle cell crisis. She does not have any established evidence of CAD. Nonetheless, left heart cath and coronary angiography at some point is reasonable, during workup and management of pulmonary hypertension.      Elder NegusManish J Iban Utz, M.D. 07/12/2018, 8:42 AM [facility] Pager: [phone] Office: [phone] If no answer: [phone]

## 2018-07-13 NOTE — Discharge Summary (Signed)
Physician Discharge Summary  Pamela Hudson QIO:[id number] DOB: 1985/07/15 DOA: 07/09/2018  PCP: Pamela Lien, Hudson  Admit date: 07/09/2018  Discharge date: 07/13/2018  Discharge Diagnoses:  Principal Problem:   Acute exacerbation of CHF (congestive heart failure) (HCC) Active Problems:   Sickle cell anemia (HCC)   Hyponatremia   Dyspnea   Essential hypertension   Anemia in other chronic diseases classified elsewhere   Discharge Condition: Stable  Disposition:  Follow-up Information    Pamela Hudson, Pamela Hudson Follow up in 1 week(s).   Specialty:  Physician Assistant Why:  Follow up in 1 week for BMP and CBC Contact information: [address] [place] [address] [phone]        Pamela Morton., MD Follow up in 1 week(s).   Specialty:  Cardiology Contact information: [address] [place] [address] [phone]        Daneen Schick, MD Follow up in 2 week(s).   Specialty:  Hematology and Oncology         Pt is discharged home in good condition and is to follow up with Pamela Lien, Hudson in 1 week to have labs evaluated. Pamela Hudson is instructed to increase activity slowly and balance with rest for the next few days, and use prescribed medication to complete treatment of pain  Diet: Regular Wt Readings from Last 3 Encounters:  07/12/18 95.3 kg  03/25/18 77.5 kg  02/19/18 80.9 kg    History of present illness:  Pamela Hudson, a 33 year old female with a medical history significant for sickle cell disease, hypertension with known coronary artery disease, status post acute MI, and pulmonary hypertension presented to the emergency room complaining of increased swelling and significant weight gain over the past month.  Also, complaining of shortness of breath with exertion.  Patient has a history in December 2019 of having a 2D echo which showed an abnormal septal wall motion defect following an acute  MI.  LV ejection fraction was 55-60% at that time.  Patient was also noted to have severe tricuspid regurgitation with moderate pulmonary hypertension.  Since February 17.  Patient noted increasing peripheral edema and weight gain.  Her sickle cell team started her on hydrochlorothiazide at that time.  Patient did not renew this prescription.  Subsequently, she saw her primary care PA and was restarted on that medication.  Patient reports continued shortness of breath particularly with exertion.  Also worsening peripheral edema.  Primary care PA switched her from hydrochlorothiazide to furosemide 20 mg p.o. daily.  Patient has had marked weight gain, weight increased from 173 pounds in February to 223 pounds on admission.  Due to increasing shortness of breath with dyspnea on exertion along with significant increased peripheral edema the patient reported to med [facility] for evaluation.  She was felt to have significant symptoms of congestive heart failure with marked peripheral edema.  ER course: Administer [place], patient was given a single dose of IV furosemide.  Assessment showed the patient was stable from a cardiac standpoint with unremarkable cardiac enzymes.  Laboratory values were unremarkable.  EKG without acute changes.  Due to significant fluid retention related to probable right-sided heart failure, the patient was transferred to [facility] for IV diuresis and cardiology consult.  Hospital Course:  Pulmonary hypertension with right-sided heart failure. Patient underwent cardiology consult who states right-sided heart failure and pulmonary hypertension is most likely related to her sickle cell disease patient thought to  need a pulmonary evaluation, VQ scan, right and left heart catheterization.  Patient recently established care with [facility] cardiology in [place], who had planned this work-up outpatient.  Patient also underwent an echocardiogram 1 week ago.  Results  are unavailable at this time.  Patient will likely be evaluated pulmonary hypertension specialist who has expertise in treating patients with sickle cell disease.  A referral has been sent per cardiology.  Meantime, patient will continue furosemide 40 mg by mouth twice daily Patient will follow-up in cardiology as scheduled.  Also, patient will follow-up with PCP in 1 week to repeat labs. Sickle cell disease: Patient was continued on folic acid without interruption.  Hydroxyurea held due to decrease hemoglobin.  On admission, hemoglobin was 5.3.  Reviewed previous hematology notes with Dr. Trinna Balloon, [facility] hematology.  Baseline was noted to be around 8.5.  Patient stated that she is getting monthly transfusions at that time.  Patient was transfused 1 unit of PRBCs during current admission.  Hemoglobin increased to around 6.0 g/dL.  Patient was not given second unit of PRBCs due to possible fluid overload.  Patient denied pain.  No signs of sickle cell crisis during admission.  Patient was admitted for sickle cell pain crisis and managed appropriately with IVF, IV Dilaudid via PCA and IV Toradol, as well as other adjunct therapies per sickle cell pain management protocols.  Patient is aware of all upcoming appointments.  Discussed at length.  Patient will continue diuretics and follow-up in primary care in 1 week.  She expressed understanding.  Patient was discharged home today in a hemodynamically stable condition.   Discharge Exam: Vitals:   07/12/18 0411 07/12/18 0937  BP: 131/74 (!) 111/58  Pulse: 100 (!) 106  Resp: 18 20  Temp: 98.1 F (36.7 C) 98.4 F (36.9 C)  SpO2: 100% 100%   Vitals:   07/11/18 1239 07/11/18 1923 07/12/18 0411 07/12/18 0937  BP: 119/62 (!) 106/50 131/74 (!) 111/58  Pulse: (!) 103 (!) 101 100 (!) 106  Resp: Temp: 98.3 F (36.8 C) 98.3 F (36.8 C) 98.1 F (36.7 C) 98.4 F (36.9 C)  TempSrc: Oral Oral Oral Oral  SpO2: 100% 100% 100%  100%  Weight:   95.3 kg   Height:       Physical Exam Constitutional:      Appearance: She is well-developed.  Eyes:     Pupils: Pupils are equal, round, and reactive to light.  Neck:     Musculoskeletal: Normal range of motion.  Cardiovascular:     Rate and Rhythm: Normal rate and regular rhythm.  Pulmonary:     Effort: Pulmonary effort is normal.     Breath sounds: Normal breath sounds. No decreased breath sounds.  Musculoskeletal: Normal range of motion.     Right lower leg: 2+ Edema present.     Left lower leg: 2+ Pitting Edema present.  Skin:    General: Skin is warm and dry.  Neurological:     General: No focal deficit present.     Mental Status: She is alert.  Psychiatric:        Mood and Affect: Mood normal.        Behavior: Behavior normal.     Discharge Instructions  Discharge Instructions    Discharge patient   Complete by:  As directed    Discharge disposition:  01-Home or Self Care   Discharge patient date:  07/12/2018  Allergies as of 07/12/2018      Reactions   Percocet [oxycodone-acetaminophen] Swelling   Lip swelling      Medication List    STOP taking these medications   hydrochlorothiazide 12.5 MG tablet Commonly known as:  HYDRODIURIL     TAKE these medications   folic acid 1 MG tablet Commonly known as:  FOLVITE Take 1 mg by mouth daily.   furosemide 20 MG tablet Commonly known as:  LASIX Take 2 tablets (40 mg total) by mouth 2 (two) times daily. What changed:    how much to take  when to take this   hydroxyurea 500 MG capsule Commonly known as:  HYDREA Take 1,000 mg by mouth daily.   IMPLANON [place] Inject 1 each into the skin once. Implanted spring 2019   Jadenu 360 MG Tabs Generic drug:  Deferasirox Take 1,080 mg by mouth daily.   magnesium oxide 400 MG tablet Commonly known as:  MAG-OX Take 400 mg by mouth daily.   Melatonin 10 MG Tabs Take 10 mg by mouth at bedtime.   metoprolol succinate 25 MG 24 hr  tablet Commonly known as:  Toprol XL Take 0.5 tablets (12.5 mg total) by mouth daily.   Vitamin D (Ergocalciferol) 1.25 MG (50000 UT) Caps capsule Commonly known as:  DRISDOL Take 100,000 Units by mouth every Sunday. 2 capsules       The results of significant diagnostics from this hospitalization (including imaging, microbiology, ancillary and laboratory) are listed below for reference.    Significant Diagnostic Studies: Dg Chest 2 View  Result Date: 07/09/2018 CLINICAL DATA:  33 year old female with history of bilateral lower extremity edema and shortness of breath since March 2020. EXAM: CHEST - 2 VIEW COMPARISON:  Chest x-ray 02/16/2018. FINDINGS: Lung volumes are normal. No consolidative airspace disease. No pleural effusions. Severe pulmonary venous congestion, without frank pulmonary edema. Mild cardiomegaly. Upper mediastinal contours are within normal limits. Right internal jugular double-lumen power porta cath with tip terminating in the distal superior vena cava. IMPRESSION: 1. Cardiomegaly with pulmonary venous congestion, but no frank pulmonary edema. Electronically Signed   By: Trudie Reedaniel  Entrikin M.D.   On: 07/09/2018 14:51    Microbiology: No results found for this or any previous visit (from the past 240 hour(s)).   Labs: Basic Metabolic Panel: Recent Labs  Lab 07/09/18 1409 07/10/18 0231 07/10/18 0705 07/11/18 0328 07/12/18 0738  NA 132*  --  134* 133* 134*  K 5.3*  --  6.0* 5.7* 5.3*  CL 107  --  106 104 102  CO2 20*  --  20* 21* 22  GLUCOSE 141*  --  124* 102* 107*  BUN 23*  --  22* 23* 26*  CREATININE 0.94 1.39* 1.35* 1.07* 1.12*  CALCIUM 8.9  --  8.8* 8.8* 9.0   Liver Function Tests: Recent Labs  Lab 07/09/18 1409 07/11/18 0328  AST 108* 101*  ALT 87* 84*  ALKPHOS 122 130*  BILITOT 9.6* 9.3*  PROT 10.3* 9.6*  ALBUMIN 3.5 3.0*   No results for input(s): LIPASE, AMYLASE in the last 168 hours. No results for input(s): AMMONIA in the last 168  hours. CBC: Recent Labs  Lab 07/09/18 1409 07/10/18 0231 07/10/18 2343 07/11/18 0328 07/12/18 0738  WBC 20.7* 23.4*  --  24.9* 20.6*  NEUTROABS 12.5*  --   --  14.8*  --   HGB 5.4* 5.3* 6.1* 6.0* 6.0*  HCT 15.3* 14.8* 17.2* 16.7* 16.9*  MCV 121.4* 118.4*  --  113.6* 111.9*  PLT 268 254  --  244 268   Cardiac Enzymes: Recent Labs  Lab 07/09/18 1409 07/09/18 1957 07/10/18 0231 07/10/18 0705 07/10/18 1322  CKTOTAL  --  33*  --   --   --   CKMB  --  0.8  --   --   --   TROPONINI <0.03  --  <0.03 <0.03 <0.03   BNP: Invalid input(s): POCBNP CBG: No results for input(s): GLUCAP in the last 168 hours.  Time coordinating discharge: 50 minutes  Signed:  Nolon Nations  APRN, MSN, FNP-C Patient Care [facility] [address] [place], [place] [address] [phone] [facility] 07/13/2018, 11:08 PM

## 2018-08-30 ENCOUNTER — Emergency Department (HOSPITAL_BASED_OUTPATIENT_CLINIC_OR_DEPARTMENT_OTHER): Payer: Medicaid Other

## 2018-08-30 ENCOUNTER — Other Ambulatory Visit: Payer: Self-pay

## 2018-08-30 ENCOUNTER — Inpatient Hospital Stay (HOSPITAL_BASED_OUTPATIENT_CLINIC_OR_DEPARTMENT_OTHER)
Admission: EM | Admit: 2018-08-30 | Discharge: 2018-09-08 | DRG: 286 | Disposition: A | Discharge status: Home / Self Care | Payer: Medicaid Other | Attending: Internal Medicine | Admitting: Internal Medicine

## 2018-08-30 ENCOUNTER — Encounter (HOSPITAL_BASED_OUTPATIENT_CLINIC_OR_DEPARTMENT_OTHER): Payer: Self-pay

## 2018-08-30 DIAGNOSIS — R0600 Dyspnea, unspecified: Secondary | ICD-10-CM | POA: Diagnosis not present

## 2018-08-30 DIAGNOSIS — Z87891 Personal history of nicotine dependence: Secondary | ICD-10-CM | POA: Diagnosis not present

## 2018-08-30 DIAGNOSIS — E039 Hypothyroidism, unspecified: Secondary | ICD-10-CM | POA: Diagnosis present

## 2018-08-30 DIAGNOSIS — I5033 Acute on chronic diastolic (congestive) heart failure: Secondary | ICD-10-CM | POA: Diagnosis present

## 2018-08-30 DIAGNOSIS — Z793 Long term (current) use of hormonal contraceptives: Secondary | ICD-10-CM

## 2018-08-30 DIAGNOSIS — G8929 Other chronic pain: Secondary | ICD-10-CM | POA: Diagnosis not present

## 2018-08-30 DIAGNOSIS — D5700 Hb-SS disease with crisis, unspecified: Secondary | ICD-10-CM | POA: Diagnosis not present

## 2018-08-30 DIAGNOSIS — I361 Nonrheumatic tricuspid (valve) insufficiency: Secondary | ICD-10-CM | POA: Diagnosis present

## 2018-08-30 DIAGNOSIS — Z885 Allergy status to narcotic agent status: Secondary | ICD-10-CM | POA: Diagnosis not present

## 2018-08-30 DIAGNOSIS — R7401 Elevation of levels of liver transaminase levels: Secondary | ICD-10-CM | POA: Diagnosis present

## 2018-08-30 DIAGNOSIS — G4733 Obstructive sleep apnea (adult) (pediatric): Secondary | ICD-10-CM | POA: Diagnosis not present

## 2018-08-30 DIAGNOSIS — I509 Heart failure, unspecified: Secondary | ICD-10-CM

## 2018-08-30 DIAGNOSIS — D571 Sickle-cell disease without crisis: Secondary | ICD-10-CM

## 2018-08-30 DIAGNOSIS — D649 Anemia, unspecified: Secondary | ICD-10-CM

## 2018-08-30 DIAGNOSIS — Z79890 Hormone replacement therapy (postmenopausal): Secondary | ICD-10-CM

## 2018-08-30 DIAGNOSIS — E282 Polycystic ovarian syndrome: Secondary | ICD-10-CM | POA: Diagnosis not present

## 2018-08-30 DIAGNOSIS — E892 Postprocedural hypoparathyroidism: Secondary | ICD-10-CM | POA: Diagnosis not present

## 2018-08-30 DIAGNOSIS — I2729 Other secondary pulmonary hypertension: Secondary | ICD-10-CM | POA: Diagnosis not present

## 2018-08-30 DIAGNOSIS — I5082 Biventricular heart failure: Secondary | ICD-10-CM | POA: Diagnosis not present

## 2018-08-30 DIAGNOSIS — Z9049 Acquired absence of other specified parts of digestive tract: Secondary | ICD-10-CM

## 2018-08-30 DIAGNOSIS — N179 Acute kidney failure, unspecified: Secondary | ICD-10-CM | POA: Diagnosis not present

## 2018-08-30 DIAGNOSIS — Z20828 Contact with and (suspected) exposure to other viral communicable diseases: Secondary | ICD-10-CM | POA: Diagnosis present

## 2018-08-30 DIAGNOSIS — I110 Hypertensive heart disease with heart failure: Secondary | ICD-10-CM | POA: Diagnosis not present

## 2018-08-30 DIAGNOSIS — I50813 Acute on chronic right heart failure: Secondary | ICD-10-CM | POA: Diagnosis present

## 2018-08-30 DIAGNOSIS — I10 Essential (primary) hypertension: Secondary | ICD-10-CM

## 2018-08-30 DIAGNOSIS — Z8249 Family history of ischemic heart disease and other diseases of the circulatory system: Secondary | ICD-10-CM

## 2018-08-30 DIAGNOSIS — R17 Unspecified jaundice: Secondary | ICD-10-CM | POA: Diagnosis present

## 2018-08-30 DIAGNOSIS — R188 Other ascites: Secondary | ICD-10-CM

## 2018-08-30 DIAGNOSIS — Z79899 Other long term (current) drug therapy: Secondary | ICD-10-CM | POA: Diagnosis not present

## 2018-08-30 DIAGNOSIS — E871 Hypo-osmolality and hyponatremia: Secondary | ICD-10-CM | POA: Diagnosis present

## 2018-08-30 DIAGNOSIS — D72829 Elevated white blood cell count, unspecified: Secondary | ICD-10-CM | POA: Diagnosis present

## 2018-08-30 DIAGNOSIS — Z833 Family history of diabetes mellitus: Secondary | ICD-10-CM | POA: Diagnosis not present

## 2018-08-30 DIAGNOSIS — Z8673 Personal history of transient ischemic attack (TIA), and cerebral infarction without residual deficits: Secondary | ICD-10-CM

## 2018-08-30 DIAGNOSIS — Z6841 Body Mass Index (BMI) 40.0 and over, adult: Secondary | ICD-10-CM | POA: Diagnosis not present

## 2018-08-30 DIAGNOSIS — D638 Anemia in other chronic diseases classified elsewhere: Secondary | ICD-10-CM | POA: Diagnosis present

## 2018-08-30 HISTORY — DX: Heart failure, unspecified: I50.9

## 2018-08-30 LAB — COMPREHENSIVE METABOLIC PANEL
ALT: 84 U/L — ABNORMAL HIGH (ref 0–44)
AST: 103 U/L — ABNORMAL HIGH (ref 15–41)
Albumin: 3.1 g/dL — ABNORMAL LOW (ref 3.5–5.0)
Alkaline Phosphatase: 107 U/L (ref 38–126)
Anion gap: 8 (ref 5–15)
BUN: 59 mg/dL — ABNORMAL HIGH (ref 6–20)
CO2: 20 mmol/L — ABNORMAL LOW (ref 22–32)
Calcium: 8.7 mg/dL — ABNORMAL LOW (ref 8.9–10.3)
Chloride: 104 mmol/L (ref 98–111)
Creatinine, Ser: 1.58 mg/dL — ABNORMAL HIGH (ref 0.44–1.00)
GFR calc Af Amer: 50 mL/min — ABNORMAL LOW (ref >60)
GFR calc non Af Amer: 43 mL/min — ABNORMAL LOW (ref >60)
Glucose, Bld: 140 mg/dL — ABNORMAL HIGH (ref 70–99)
Potassium: 4.7 mmol/L (ref 3.5–5.1)
Sodium: 132 mmol/L — ABNORMAL LOW (ref 135–145)
Total Bilirubin: 8.8 mg/dL — ABNORMAL HIGH (ref 0.3–1.2)
Total Protein: 10.1 g/dL — ABNORMAL HIGH (ref 6.5–8.1)

## 2018-08-30 LAB — CBC WITH DIFFERENTIAL/PLATELET
Abs Immature Granulocytes: 0.23 10*3/uL — ABNORMAL HIGH (ref 0.00–0.07)
Basophils Absolute: 0.1 10*3/uL (ref 0.0–0.1)
Basophils Relative: 1 %
Eosinophils Absolute: 0.2 10*3/uL (ref 0.0–0.5)
Eosinophils Relative: 1 %
HCT: 16.9 % — ABNORMAL LOW (ref 36.0–46.0)
Hemoglobin: 5.8 g/dL — CL (ref 12.0–15.0)
Immature Granulocytes: 1 %
Lymphocytes Relative: 24 %
Lymphs Abs: 4.6 10*3/uL — ABNORMAL HIGH (ref 0.7–4.0)
MCH: 40 pg — ABNORMAL HIGH (ref 26.0–34.0)
MCHC: 34.3 g/dL (ref 30.0–36.0)
MCV: 116.6 fL — ABNORMAL HIGH (ref 80.0–100.0)
Monocytes Absolute: 2.8 10*3/uL — ABNORMAL HIGH (ref 0.1–1.0)
Monocytes Relative: 15 %
Neutro Abs: 11.3 10*3/uL — ABNORMAL HIGH (ref 1.7–7.7)
Neutrophils Relative %: 58 %
Platelets: 250 10*3/uL (ref 150–400)
RBC: 1.45 MIL/uL — ABNORMAL LOW (ref 3.87–5.11)
RDW: 23.9 % — ABNORMAL HIGH (ref 11.5–15.5)
Smear Review: NORMAL
WBC: 19.2 10*3/uL — ABNORMAL HIGH (ref 4.0–10.5)
nRBC: 6.2 % — ABNORMAL HIGH (ref 0.0–0.2)

## 2018-08-30 LAB — TROPONIN I: Troponin I: 0.04 ng/mL (ref <0.03)

## 2018-08-30 LAB — SARS CORONAVIRUS 2 AG (30 MIN TAT): SARS Coronavirus 2 Ag: NEGATIVE

## 2018-08-30 LAB — BRAIN NATRIURETIC PEPTIDE: B Natriuretic Peptide: 571 pg/mL — ABNORMAL HIGH (ref 0.0–100.0)

## 2018-08-30 MED ORDER — ONDANSETRON HCL 4 MG/2ML IJ SOLN: 4.0000 mg | Freq: Four times a day (QID) | INTRAMUSCULAR | Status: DC | PRN

## 2018-08-30 MED ORDER — ONDANSETRON HCL 4 MG PO TABS: 4.0000 mg | ORAL_TABLET | Freq: Four times a day (QID) | ORAL | Status: DC | PRN

## 2018-08-30 MED ORDER — ENOXAPARIN SODIUM 30 MG/0.3ML ~~LOC~~ SOLN
30.0000 mg | Freq: Every day | SUBCUTANEOUS | Status: DC
  Administered 2018-08-31 – 2018-09-03 (×5): 30 mg via SUBCUTANEOUS
  Filled 2018-08-30 (×5): qty 0.3

## 2018-08-30 MED ORDER — FOLIC ACID 1 MG PO TABS
1.0000 mg | ORAL_TABLET | Freq: Every day | ORAL | Status: DC
  Administered 2018-08-31 – 2018-09-08 (×9): 1 mg via ORAL
  Filled 2018-08-30 (×9): qty 1

## 2018-08-30 MED ORDER — METOPROLOL SUCCINATE ER 25 MG PO TB24
12.5000 mg | ORAL_TABLET | Freq: Every day | ORAL | Status: DC
  Administered 2018-08-31 – 2018-09-01 (×2): 12.5 mg via ORAL
  Filled 2018-08-30 (×2): qty 1

## 2018-08-30 MED ORDER — FUROSEMIDE 10 MG/ML IJ SOLN
60.0000 mg | Freq: Once | INTRAMUSCULAR | Status: AC
  Administered 2018-08-30: 60 mg via INTRAVENOUS
  Filled 2018-08-30: qty 6

## 2018-08-30 MED ORDER — FUROSEMIDE 10 MG/ML IJ SOLN
40.0000 mg | Freq: Two times a day (BID) | INTRAMUSCULAR | Status: DC
  Administered 2018-08-31 – 2018-09-01 (×3): 40 mg via INTRAVENOUS
  Filled 2018-08-30 (×4): qty 4

## 2018-08-30 MED ORDER — DEFERASIROX 360 MG PO TABS: 1080.0000 mg | ORAL_TABLET | Freq: Every day | ORAL | Status: DC

## 2018-08-30 MED ORDER — HYDROXYUREA 500 MG PO CAPS: 1000.0000 mg | ORAL_CAPSULE | Freq: Every day | ORAL | Status: DC

## 2018-08-30 NOTE — ED Triage Notes (Signed)
Pt c/oi swelling to bilat LE and abd x "months"-seen by cards and pulmonary-states swelling worse x 1 week-NAD-steady gait

## 2018-08-30 NOTE — H&P (Signed)
Pamela Hudson TDV:[id number] DOB: [birth year] DOA: 08/30/2018     PCP: Curt Jews, PA-C   Outpatient Specialists:   CARDS: Cardiology and recent stay on   Hematology Dr.  Dr. Griffin Basil, [facility].       Patient arrived to ER on 08/30/18 at 1640  Patient coming from: home Lives  With family    Chief Complaint:  Chief Complaint  Patient presents with  . Edema    HPI: Pamela Hudson is a 33 y.o. 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 Since then she initially was on Lasix and then switched to torsemide.  Patient states she has been taking her home medications denied excessive salt use.  Denies any chest pain.  Tenuous to have increased weight and worsening shortness of breath lower extremity swelling she reports her right leg is somewhat worse than her left.  As well as abdominal girth and distention. Followed by cardiology in [facility] Currently denies any sickle cell pain. Last transfusion was during last admission in April her baseline hemoglobin around 6  Infectious risk factors:  Reports shortness of breath  In  ER RAPID COVID TEST NEGATIVE Send out Pending    Regarding pertinent Chronic problems:      HTN on metoprolol   CHF diastolic  -   echo December 2019 showed preserved EF abnormal septal wall motion defect if severe tricuspid regurgitation and moderate pulmonary hypertension Echogram in April at [place] mitral regurgitation. Severe tricuspid regurgitation. Mild concentric left ventricular hypertrophy.  EF estimated at 65-70%.Diastolic function appears normal. right ventricular pressure and volume overload.   Hypothyroidism:  Lab  Results  Component Value Date   TSH 5.241 (H) 02/18/2018   on synthroid    Morbid obesity-   BMI Readings from Last 1 Encounters:  08/30/18 49.28 kg/m       OSA -she is supposed to start on the CPAP    While in ER:  The following Work up has been ordered so far:  Orders Placed This Encounter  Procedures  . SARS Coronavirus 2 (Hosp order,Performed in [facility] lab via [employer] [facility])  . SARS Coronavirus 2 (CEPHEID- Performed in [place] hospital lab), [facility]  . DG Chest Port 1 View  . CBC with Differential/Platelet  . Comprehensive metabolic panel  . Brain natriuretic peptide  . Troponin I - ONCE - STAT  . HIV antibody (Routine Testing)  . Magnesium  . Phosphorus  . TSH  . Comprehensive metabolic panel  . CBC  . Troponin I - Now Then Q6H  . CBC  . Creatinine, urine, random  . Sodium, urine, random  . Osmolality, urine  . Urinalysis, Routine w reflex microscopic  . Diet Heart Room service appropriate? Yes; Fluid consistency: Thin  . Cardiac monitoring  . Notify physician (specify)  . Activity per Heart Failure Clinical Pathway  . Initiate Heart Failure clinical pathway  . Daily weights  . Strict intake and output  . In and Out Cath  . Patient education (specify):  Marland Kitchen Vital signs  . Notify physician  . Up with assistance  . If patient diabetic or glucose greater than 140 notify physician for Sliding Scale Insulin Orders  .  May go off telemetry for tests/procedures  . Oral care per nursing protocol  . Initiate Oral Care Protocol  . Initiate Carrier Fluid Protocol  . RN may order General Admission PRN Orders utilizing "General Admission PRN medications" (through manage orders) for the following patient needs: allergy symptoms (Claritin), cold sores (Carmex), cough (Robitussin DM), eye irritation (Liquifilm Tears), hemorrhoids (Tucks), indigestion (Maalox), minor skin irritation (Hydrocortisone Cream), muscle pain Romeo Apple(Ben Gay), nose irritation (saline nasal spray)  and sore throat (Chloraseptic spray).  . Cardiac Monitoring Continuous x 24 hours Indications for use: Sub-acute heart failure  . No ACE Inhibitor / ARB  . Patient has an active order for admit to inpatient/place in observation  . Apply neutral pressure cap  . May draw labs from central line  . For first catheter occlusion notify IV team ([facility], [facility]) or provider at all other sites.  . Full code  . Consult to hospitalist  ALL PATIENTS BEING ADMITTED/HAVING PROCEDURES NEED COVID-19 SCREENING  . Consult to case management  . Medication reconciliation pending pharmacist review  . Droplet and Contact precautions  . OT eval and treat  . PT eval and treat  . PT eval and treat  . CPAP  . Oxygen therapy Mode or (Route): Nasal cannula; Keep 02 saturation: >/= 92%  . Pulse oximetry check with vital signs  . Oxygen therapy Mode or (Route): Nasal cannula; Liters Per Minute: 2; Keep 02 saturation: greater than 92 %  . Incentive spirometry  . EKG 12-Lead  . EKG 12-Lead  . EKG 12-Lead  . ECHOCARDIOGRAM COMPLETE  . Type and [facility]  . Insert peripheral IV  . Routine line care  . Place in observation (patient's expected length of stay will be less than 2 midnights)    Following Medications were ordered in ER: Medications  metoprolol succinate (TOPROL-XL) 24 hr tablet 12.5 mg (has no administration in time range)  folic acid (FOLVITE) tablet 1 mg (has no administration in time range)  Deferasirox TABS 1,080 mg (has no administration in time range)  ondansetron (ZOFRAN) tablet 4 mg (has no administration in time range)    Or  ondansetron (ZOFRAN) injection 4 mg (has no administration in time range)  furosemide (LASIX) injection 40 mg (has no administration in time range)  enoxaparin (LOVENOX) injection 30 mg (30 mg Subcutaneous Given 08/31/18 0057)  sodium chloride flush (NS) 0.9 % injection 10-40 mL (has no administration in time range)  furosemide (LASIX) injection 60  mg (60 mg Intravenous Given 08/30/18 1950)        Consult Orders  (From admission, onward)         Start     Ordered   08/30/18 2351  PT eval and treat  Routine    Question:  Reason for PT?  Answer:  mobility issues   08/30/18 2353   08/30/18 2351  OT eval and treat  Routine    Question:  Reason for OT?  Answer:  Mobility Issues   08/30/18 2353   08/30/18 2350  Consult to case management  Once    Comments: Heart failure home health screen and may place order for PT/OT eval and treat if indicated.  Provider:  (Not yet assigned)  Question:  Reason for consult:  Answer:  Other (see comments)   08/30/18 2353   08/30/18 2350  PT eval and treat  Routine    Question:  Reason for PT?  Answer:  mobility issues   08/30/18 2353  08/30/18 1915  Consult to hospitalist  ALL PATIENTS BEING ADMITTED/HAVING PROCEDURES NEED COVID-19 SCREENING. Paged via [employer] line. Spoke with [employer]. [facility].  Spoke with Tammy.  Once    Comments: ALL PATIENTS BEING ADMITTED/HAVING PROCEDURES NEED COVID-19 SCREENING  Provider:  (Not yet assigned)  Question Answer Comment  Place call to: Need hospitalist [facility]   Reason for Consult Admit      08/30/18 1914             Significant initial  Findings: Abnormal Labs Reviewed  CBC WITH DIFFERENTIAL/PLATELET - Abnormal; Notable for the following components:      Result Value   WBC 19.2 (*)    RBC 1.45 (*)    Hemoglobin 5.8 (*)    HCT 16.9 (*)    MCV 116.6 (*)    MCH 40.0 (*)    RDW 23.9 (*)    nRBC 6.2 (*)    Neutro Abs 11.3 (*)    Lymphs Abs 4.6 (*)    Monocytes Absolute 2.8 (*)    Abs Immature Granulocytes 0.23 (*)    All other components within normal limits  COMPREHENSIVE METABOLIC PANEL - Abnormal; Notable for the following components:   Sodium 132 (*)    CO2 20 (*)    Glucose, Bld 140 (*)    BUN 59 (*)    Creatinine, Ser 1.58 (*)    Calcium 8.7 (*)    Total Protein 10.1 (*)    Albumin 3.1 (*)    AST  103 (*)    ALT 84 (*)    Total Bilirubin 8.8 (*)    GFR calc non Af Amer 43 (*)    GFR calc Af Amer 50 (*)    All other components within normal limits  BRAIN NATRIURETIC PEPTIDE - Abnormal; Notable for the following components:   B Natriuretic Peptide 571.0 (*)    All other components within normal limits  TROPONIN I - Abnormal; Notable for the following components:   Troponin I 0.04 (*)    All other components within normal limits     Otherwise labs showing:    Recent Labs  Lab 08/30/18 1737  NA 132*  K 4.7  CO2 20*  GLUCOSE 140*  BUN 59*  CREATININE 1.58*  CALCIUM 8.7*    Cr  Up from baseline see below Lab Results  Component Value Date   CREATININE 1.58 (H) 08/30/2018   CREATININE 1.12 (H) 07/12/2018   CREATININE 1.07 (H) 07/11/2018    Recent Labs  Lab 08/30/18 1737  AST 103*  ALT 84*  ALKPHOS 107  BILITOT 8.8*  PROT 10.1*  ALBUMIN 3.1*   Lab Results  Component Value Date   CALCIUM 8.7 (L) 08/30/2018      WBC      Component Value Date/Time   WBC 19.2 (H) 08/30/2018 1737   ANC    Component Value Date/Time   NEUTROABS 11.3 (H) 08/30/2018 1737    Plt: Lab Results  Component Value Date   PLT 250 08/30/2018     Lactic Acid, Venous    Component Value Date/Time   LATICACIDVEN 0.93 12/25/2016 2246       Lab Results  Component Value Date   SARSCOV2NAA NEGATIVE 08/31/2018   SARSCOV2NAA NEGATIVE 08/30/2018     HG/HCT   stable from baseline in April 6.0    Component Value Date/Time   HGB 5.8 (LL) 08/30/2018 1737   HCT 16.9 (L) 08/30/2018 1737  Troponin   Cardiac Panel (last 3 results) Recent Labs    08/30/18 1737  TROPONINI 0.04*      BNP (last 3 results) Recent Labs    03/25/18 1223 07/09/18 1409 08/30/18 1737  BNP 242.8* 449.3* 571.0*    ProBNP (last 3 results) No results for input(s): PROBNP in the last 8760 hours.    UA   ordered     CXR - cardiomegaly with mild pulmonary vascular congestion    ECG:   Personally reviewed by me showing: HR : 111 Rhythm:   Sinus tachycardia   no evidence of ischemic changes QTC 435     ED Triage Vitals  Enc Vitals Group     BP 08/30/18 1701 135/79     Pulse Rate 08/30/18 1701 (!) 122     Resp 08/30/18 1701 20     Temp 08/30/18 1701 98.8 F (37.1 C)     Temp Source 08/30/18 1701 Oral     SpO2 08/30/18 1701 95 %     Weight 08/30/18 1657 244 lb (110.7 kg)     Height 08/30/18 1657  (1.499 m)     Head Circumference --      Peak Flow --      Pain Score 08/30/18 1654 0     Pain Loc --      Pain Edu? --      Excl. in GC? --   TMAX(24)@       Latest  Blood pressure 119/68, pulse (!) 104, temperature 99.2 F (37.3 C), temperature source Oral, resp. rate 18, height  (1.499 m), weight 110.7 kg, SpO2 100 %.    Hospitalist was called for admission for fluid overload in the setting of tricuspid regurgitation   Review of Systems:    Pertinent positives include: shortness of breath at rest dyspnea on exertion, Bilateral lower extremity swelling    Constitutional:  No weight loss, night sweats, Fevers, chills, fatigue, weight loss  HEENT:  No headaches, Difficulty swallowing,Tooth/dental problems,Sore throat,  No sneezing, itching, ear ache, nasal congestion, post nasal drip,  Cardio-vascular:  No chest pain, Orthopnea, PND, anasarca, dizziness, palpitations.no GI:  No heartburn, indigestion, abdominal pain, nausea, vomiting, diarrhea, change in bowel habits, loss of appetite, melena, blood in stool, hematemesis Resp:   No excess mucus, no productive cough, No non-productive cough, No coughing up of blood.No change in color of mucus.No wheezing. Skin:  no rash or lesions. No jaundice GU:  no dysuria, change in color of urine, no urgency or frequency. No straining to urinate.  No flank pain.  Musculoskeletal:  No joint pain or no joint swelling. No decreased range of motion. No back pain.  Psych:  No change in mood or affect. No  depression or anxiety. No memory loss.  Neuro: no localizing neurological complaints, no tingling, no weakness, no double vision, no gait abnormality, no slurred speech, no confusion  All systems reviewed and apart from HOPI all are negative  Past Medical History:   Past Medical History:  Diagnosis Date  . Cardiomegaly   . CHF (congestive heart failure) (HCC)   . Chronic pain   . CVA (cerebral infarction) 02/2002  . Hypertension   . Liver disease    ?iron  . PCOS (polycystic ovarian syndrome)   . Renal stone   . Sickle cell anemia (HCC)        Past Surgical History:  Procedure Laterality Date  . CHOLECYSTECTOMY    . LIVER BIOPSY  2007  .  PARATHYROIDECTOMY    . PORTACATH PLACEMENT      Social History:  Ambulatory  independently       reports that she has quit smoking. She has never used smokeless tobacco. She reports current alcohol use. She reports that she does not use drugs.     Family History:   Family History  Problem Relation Age of Onset  . Diabetes Mellitus II Father   . CAD Father   . Diabetes Mellitus II Paternal Grandmother     Allergies: Allergies  Allergen Reactions  . Percocet [Oxycodone-Acetaminophen] Swelling    Lip swelling     Prior to Admission medications   Medication Sig Start Date End Date Taking? Authorizing Provider  Deferasirox (JADENU) 360 MG TABS Take 1,080 mg by mouth daily.    Yes [provider]  Etonogestrel (IMPLANON ) Inject 1 each into the skin once. Implanted spring 2019   Yes [provider]  folic acid (FOLVITE) 1 MG tablet Take 1 mg by mouth daily. 11/21/15  Yes [provider]  hydroxyurea (HYDREA) 500 MG capsule Take 1,000 mg by mouth daily.    Yes [provider]  magnesium oxide (MAG-OX) 400 MG tablet Take 400 mg by mouth daily. 01/26/18  Yes [provider]  metoprolol succinate (TOPROL XL) 25 MG 24 hr tablet Take 0.5 tablets (12.5 mg total) by mouth daily. 11/29/15  Yes  Quentin AngstJegede, Olugbemiga E, MD  torsemide (DEMADEX) 20 MG tablet Take 40 mg by mouth daily. 07/28/18  Yes [provider]  Vitamin D, Ergocalciferol, (DRISDOL) 1.25 MG (50000 UT) CAPS capsule Take 100,000 Units by mouth every Sunday. 2 capsules 01/26/18  Yes [provider]  furosemide (LASIX) 20 MG tablet Take 2 tablets (40 mg total) by mouth 2 (two) times daily. Patient not taking: Reported on 08/30/2018 07/12/18   Massie MaroonHollis, Lachina M, FNP   Physical Exam: Blood pressure 119/68, pulse (!) 104, temperature 99.2 F (37.3 C), temperature source Oral, resp. rate 18, height 4\' 11"  (1.499 m), weight 110.7 kg, SpO2 100 %. 1. General:  in No  Acute distress   Chronically ill -appearing 2. Psychological: Alert and  Oriented 3. Head/ENT:   Moist Mucous Membranes                          Head Non traumatic, neck supple                           Poor Dentition 4. SKIN: normal  Skin turgor,  Skin clean Dry and intact no rash 5. Heart: Regular rate and rhythm no  Murmur, no Rub or gallop, positive heave 6. Lungs:   no wheezes some crackles   7. Abdomen: Soft,  non-tender,   distended   obese  bowel sounds present 8. Lower extremities: no clubbing, cyanosis, 3+edema up to the thighs 9. Neurologically Grossly intact, moving all 4 extremities equally   10. MSK: Normal range of motion   All other LABS:     Recent Labs  Lab 08/30/18 1737  WBC 19.2*  NEUTROABS 11.3*  HGB 5.8*  HCT 16.9*  MCV 116.6*  PLT 250     Recent Labs  Lab 08/30/18 1737  NA 132*  K 4.7  CL 104  CO2 20*  GLUCOSE 140*  BUN 59*  CREATININE 1.58*  CALCIUM 8.7*     Recent Labs  Lab 08/30/18 1737  AST 103*  ALT  84*  ALKPHOS 107  BILITOT 8.8*  PROT 10.1*  ALBUMIN 3.1*       Cultures:    Component Value Date/Time   SDES  02/16/2018 1920    BLOOD ARTERIAL Performed at [facility], [address] [place], [place] [address]    [facility]  02/16/2018 1920    BOTTLES DRAWN AEROBIC AND  ANAEROBIC Blood Culture adequate volume Performed at [facility], [address], [place], [place] [address]    CULT  02/16/2018 1920    NO GROWTH 5 DAYS Performed at [facility], [address] [place], [place] [address]    REPTSTATUS 02/21/2018 FINAL 02/16/2018 1920     Radiological Exams on Admission: Dg Chest Port 1 View  Result Date: 08/30/2018 CLINICAL DATA:  SOB EXAM: PORTABLE CHEST 1 VIEW COMPARISON:  08/08/2018 FINDINGS: There is no focal consolidation. There is no pleural effusion or pneumothorax. There is mild bilateral interstitial prominence. There is stable cardiomegaly. There is a right-sided Port-A-Cath in satisfactory position. There is no acute osseous abnormality. IMPRESSION: Cardiomegaly with mild pulmonary vascular congestion. Electronically Signed   By: Elige Ko   On: 08/30/2018 19:00    Chart has been reviewed    Assessment/Plan  33 y.o. 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 Acute on chronic right-sided heart failure (HCC) - - admit on telemetry,  cycle cardiac enzymes, Troponin   obtain serial ECG  to evaluate for ischemia as a cause of heart failure  monitor daily weight:  Filed Weights   08/30/18 1657  Weight: 110.7 kg   Last BNP BNP (last 3 results) Recent Labs    03/25/18 1223 07/09/18 1409 08/30/18 1737  BNP 242.8* 449.3* 571.0*      diurese with IV lasix and monitor orthostatics and creatinine to avoid over diuresis.  Order echogram to evaluate EF and valves     cardiology consulted   . Essential hypertension - continue Home medications   . Sickle cell anemia (HCC) stable not in crisis continue to monitor . AKI (acute kidney injury) (HCC) -in the setting of evidence of fluid overload will be mindful  and monitor creatinine carefully while diuresing . Elevated bilirubin -chronic history of sickle cell anemia . Hyponatremia -in the setting of fluid overload, obtain urine electrolytes and continue to monitor . Leukocytosis chronic patient has history of sickle cell anemia . Elevated troponin-  -no chest pain no EKG changes in the setting of  increased work of breathing   due to demand ischemia  monitor on telemetry and cycle cardiac enzymes to trend.  if continues to rise will need further work-up Chronic anemia.  Currently hemoglobin around baseline given fluid overload will hold off on further blood transfusions right now   Other plan as per orders.  DVT prophylaxis:    Lovenox     Code Status:  FULL CODE as per patient   I had personally discussed CODE STATUS with patient    Family Communication:   Family not at  Bedside    Disposition Plan:       To home once workup is complete and patient is stable                    Would benefit from  PT/OT eval prior to DC  Ordered                                      Consults called: email cardiology     Admission status:  ED Disposition    ED Disposition Condition Comment   Admit  Hospital Area: [facility]OSPITAL [[id number]]  Level of Care: Telemetry [5]  Admit to tele based on following criteria: Acute CHF  Covid Evaluation: Screening Protocol (No Symptoms)  Diagnosis: Acute on chronic right-sided heart failure [facility]) [[id number]]  Admitting Physician: Briscoe Deutscher [[id number]]  Attending Physician: Briscoe Deutscher [[id number]]  PT Class (Do Not Modify): Observation [104]  PT Acc Code (Do Not Modify): Observation [10022]         Obs    Level of care     tele  For  24H   Precautions:   Droplet,   Droplet and Contact precautions  PPE: Used by the provider:   P100  eye Goggles,  Gloves      Pamela Hudson 08/31/2018, 2:15 AM    [facility]     after 2 AM please page floor coverage PA If 7AM-7PM, please  contact the day team taking care of the patient using [email or web address]

## 2018-08-30 NOTE — ED Provider Notes (Signed)
[facility] EMERGENCY DEPARTMENT Provider Note   CSN: [id number] Arrival date & time: 08/30/18  1640     History   Chief Complaint Chief Complaint  Patient presents with  . Edema    HPI Pamela Hudson is a 33 y.o. female.     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  Patient was last hospitalized in April at which time she had IV diuresis and blood transfusion for anemia with improvement in her symptoms.  She was initially discharged home on Lasix but after following up with cardiology and pulmonology at [facility] she was changed to torsemide.  Last visit was on 08/15/2018 at which time her torsemide was increased to twice daily.  Patient states that despite taking the torsemide twice a day she is having worsening swelling in the last 2 days with a total of the 15 pound weight gain.  She is having exertional dyspnea but denies any dyspnea at rest.  She also has orthopnea.  She has had no cough, fever, chest pain, abdominal pain, nausea or vomiting.  She is getting set up for a CPAP machine at home.  She has not missed any doses of her medication and denies any significant dietary changes.  The history is provided by the patient.    Past Medical History:  Diagnosis Date  . Cardiomegaly   . CHF (congestive heart failure) ([place])   . Chronic pain   . CVA (cerebral infarction) 02/2002  . Hypertension   . Liver disease    ?iron  . PCOS (polycystic ovarian syndrome)   . Renal stone   . Sickle cell anemia [facility])     Patient Active Problem List   Diagnosis Date Noted  . Acute exacerbation of CHF (congestive heart failure) ([place]) 07/09/2018  . Anemia in other chronic diseases classified elsewhere 07/09/2018  . Hyperkalemia 03/25/2018  . Acute  respiratory failure with hypoxia ([place]) 02/17/2018  . Essential hypertension 02/17/2018  . Acute on chronic anemia 02/17/2018  . Dyspnea 02/16/2018  . Gastroenteritis 05/28/2017  . Dehydration 05/28/2017  . AKI (acute kidney injury) ([place]) 05/28/2017  . Sickle cell anemia with crisis ([place]) 05/28/2017  . Hemorrhagic ovarian cyst 12/26/2016  . Hemorrhagic cyst of ovary 12/26/2016  . Pneumonia 11/26/2015  . Sickle cell anemia ([place]) 11/26/2015  . Elevated bilirubin 11/26/2015  . Serum calcium elevated 11/26/2015  . Hyponatremia 11/26/2015  . Leukocytosis 11/26/2015  . CVA (cerebral infarction) 02/13/2012    Past Surgical History:  Procedure Laterality Date  . CHOLECYSTECTOMY    . LIVER BIOPSY  2007  . PARATHYROIDECTOMY    . PORTACATH PLACEMENT       OB History   No obstetric history on file.      Home Medications    Prior to Admission medications   Medication Sig Start Date End Date Taking? Authorizing Provider  Deferasirox (JADENU) 360 MG TABS Take 1,080 mg by mouth daily.     [provider]  Etonogestrel (IMPLANON [place]) Inject 1 each into the skin once. Implanted spring 2019    [provider]  folic acid (FOLVITE) 1 MG tablet Take 1 mg by mouth daily. 11/21/15   [provider]  furosemide (LASIX) 20 MG tablet Take 2 tablets (40 mg total) by mouth 2 (two) times daily. 07/12/18  Massie MaroonHollis, Lachina M, FNP  hydroxyurea (HYDREA) 500 MG capsule Take 1,000 mg by mouth daily.     [provider]  magnesium oxide (MAG-OX) 400 MG tablet Take 400 mg by mouth daily. 01/26/18   [provider]  Melatonin 10 MG TABS Take 10 mg by mouth at bedtime.    [provider]  metoprolol succinate (TOPROL XL) 25 MG 24 hr tablet Take 0.5 tablets (12.5 mg total) by mouth daily. 11/29/15   Quentin AngstJegede, Olugbemiga E, MD  Vitamin D, Ergocalciferol, (DRISDOL) 1.25 MG (50000 UT) CAPS capsule Take 100,000 Units by mouth every Sunday. 2 capsules 01/26/18    [provider]    Family History Family History  Problem Relation Age of Onset  . Diabetes Mellitus II Father   . CAD Father   . Diabetes Mellitus II Paternal Grandmother     Social History Social History   Tobacco Use  . Smoking status: Former Games developermoker  . Smokeless tobacco: Never Used  Substance Use Topics  . Alcohol use: Yes    Comment: occ  . Drug use: No     Allergies   Percocet [oxycodone-acetaminophen]   Review of Systems Review of Systems  All other systems reviewed and are negative.    Physical Exam Updated Vital Signs BP (!) 127/44   Pulse (!) 113   Temp 98.8 F (37.1 C) (Oral)   Resp 20   Ht 4\' 11"  (1.499 m)   Wt 110.7 kg   SpO2 95%   BMI 49.28 kg/m   Physical Exam Vitals signs and nursing note reviewed.  Constitutional:      General: She is not in acute distress.    Appearance: She is well-developed. She is obese.  HENT:     Head: Normocephalic and atraumatic.  Eyes:     Pupils: Pupils are equal, round, and reactive to light.  Cardiovascular:     Rate and Rhythm: Regular rhythm. Tachycardia present.     Heart sounds: Murmur present. No friction rub.  Pulmonary:     Effort: Pulmonary effort is normal.     Breath sounds: Examination of the right-lower field reveals rales. Examination of the left-lower field reveals rales. Rales present. No wheezing.  Abdominal:     General: Bowel sounds are normal. There is distension.     Palpations: Abdomen is soft.     Tenderness: There is no abdominal tenderness. There is no guarding or rebound.  Musculoskeletal: Normal range of motion.        General: No tenderness.     Right lower leg: Edema present.     Left lower leg: Edema present.     Comments: Tense 2-3+ pitting edema to the knee bilaterally  Skin:    General: Skin is warm and dry.     Capillary Refill: Capillary refill takes less than 2 seconds.     Findings: No rash.  Neurological:     General: No focal deficit present.      Mental Status: She is alert and oriented to person, place, and time. Mental status is at baseline.     Cranial Nerves: No cranial nerve deficit.  Psychiatric:        Mood and Affect: Mood normal.        Behavior: Behavior normal.        Thought Content: Thought content normal.      ED Treatments / Results  Labs (all labs ordered are listed, but only abnormal results are displayed) Labs Reviewed  CBC WITH DIFFERENTIAL/PLATELET - Abnormal; Notable for the following components:      Result Value   WBC 19.2 (*)    RBC 1.45 (*)    Hemoglobin 5.8 (*)    HCT 16.9 (*)    MCV 116.6 (*)    MCH 40.0 (*)    RDW 23.9 (*)    nRBC 6.2 (*)    Neutro Abs 11.3 (*)    Lymphs Abs 4.6 (*)    Monocytes Absolute 2.8 (*)    Abs Immature Granulocytes 0.23 (*)    All other components within normal limits  COMPREHENSIVE METABOLIC PANEL - Abnormal; Notable for the following components:   Sodium 132 (*)    CO2 20 (*)    Glucose, Bld 140 (*)    BUN 59 (*)    Creatinine, Ser 1.58 (*)    Calcium 8.7 (*)    Total Protein 10.1 (*)    Albumin 3.1 (*)    AST 103 (*)    ALT 84 (*)    Total Bilirubin 8.8 (*)    GFR calc non Af Amer 43 (*)    GFR calc Af Amer 50 (*)    All other components within normal limits  BRAIN NATRIURETIC PEPTIDE - Abnormal; Notable for the following components:   B Natriuretic Peptide 571.0 (*)    All other components within normal limits  TROPONIN I - Abnormal; Notable for the following components:   Troponin I 0.04 (*)    All other components within normal limits  SARS CORONAVIRUS 2 (HOSP ORDER, PERFORMED IN [facility] VIA ABBOTT ID)    EKG EKG Interpretation  Date/Time:  Wednesday August 30 2018 17:47:21 EDT Ventricular Rate:  111 PR Interval:    QRS Duration: 83 QT Interval:  320 QTC Calculation: 435 R Axis:   104 Text Interpretation:  Sinus tachycardia Borderline right axis deviation Low voltage, extremity and precordial leads No significant change since last  tracing Confirmed by Gwyneth Sproutlunkett, Kayti Poss ([id number]) on 08/30/2018 5:55:15 PM   Radiology Dg Chest Port 1 View  Result Date: 08/30/2018 CLINICAL DATA:  SOB EXAM: PORTABLE CHEST 1 VIEW COMPARISON:  08/08/2018 FINDINGS: There is no focal consolidation. There is no pleural effusion or pneumothorax. There is mild bilateral interstitial prominence. There is stable cardiomegaly. There is a right-sided Port-A-Cath in satisfactory position. There is no acute osseous abnormality. IMPRESSION: Cardiomegaly with mild pulmonary vascular congestion. Electronically Signed   By: Elige KoHetal  Patel   On: 08/30/2018 19:00    Procedures Procedures (including critical care time)  Medications Ordered in ED Medications  furosemide (LASIX) injection 60 mg (60 mg Intravenous Given 08/30/18 1950)     Initial Impression / Assessment and Plan / ED Course  I have reviewed the triage vital signs and the nursing notes.  Pertinent labs & imaging results that were available during my care of the patient were reviewed by me and considered in my medical decision making (see chart for details).        Patient with a complex past medical history of CHF thought to be related to right-sided heart failure and pulmonary hypertension in the setting of sickle cell disease but also severe sleep apnea.  Patient is having worsening lower extremity swelling and exertional dyspnea today.  Concern for exacerbation of CHF but also concern for possible anemia from her sickle cell disease.  Patient's last transfusion was during hospitalization in April when hemoglobin dropped to 5.  Hemoglobin was 6 prior to discharge.  Patient  has been [facility] cardiology and pulmonology but also has referral to rheumatology because of a positive ANA.  Patient has been set up for CPAP machine and has been taking her torsemide twice daily as instructed without increased urine output.  Patient is fluid overloaded on exam here and tachycardic.  She is  in no acute distress.  Oxygen saturation is 95% on room air but with ambulation drops to the high 80s. Labs and chest x-ray pending.  7:47 PM Since labs are consistent with persistent anemia with hemoglobin of 5.8 which seems to be about baseline for the last few months, CMP with mild elevation of creatinine to 1.5 from baseline of 1.3 and elevated BNP of 571 higher than prior.  Final Clinical Impressions(s) / ED Diagnoses   Final diagnoses:  Acute on chronic right-sided congestive heart failure (HCC)  Anemia, unspecified type    ED Discharge Orders    None       Gwyneth SproutPlunkett, Surie Suchocki, MD 08/30/18 2024

## 2018-08-30 NOTE — ED Notes (Signed)
Pt O2 sats 88% on room air. Placed on O2 2lpm via nasal canula.

## 2018-08-30 NOTE — ED Notes (Signed)
Pt given ice chips with verbal approval from Dr. Maryan Rued

## 2018-08-31 ENCOUNTER — Observation Stay (HOSPITAL_COMMUNITY): Payer: Medicaid Other

## 2018-08-31 DIAGNOSIS — R7989 Other specified abnormal findings of blood chemistry: Secondary | ICD-10-CM | POA: Diagnosis not present

## 2018-08-31 DIAGNOSIS — Z833 Family history of diabetes mellitus: Secondary | ICD-10-CM | POA: Diagnosis not present

## 2018-08-31 DIAGNOSIS — Z87891 Personal history of nicotine dependence: Secondary | ICD-10-CM | POA: Diagnosis not present

## 2018-08-31 DIAGNOSIS — I2729 Other secondary pulmonary hypertension: Secondary | ICD-10-CM | POA: Diagnosis present

## 2018-08-31 DIAGNOSIS — Z8673 Personal history of transient ischemic attack (TIA), and cerebral infarction without residual deficits: Secondary | ICD-10-CM | POA: Diagnosis not present

## 2018-08-31 DIAGNOSIS — Z79899 Other long term (current) drug therapy: Secondary | ICD-10-CM | POA: Diagnosis not present

## 2018-08-31 DIAGNOSIS — R7401 Elevation of levels of liver transaminase levels: Secondary | ICD-10-CM | POA: Diagnosis present

## 2018-08-31 DIAGNOSIS — M7989 Other specified soft tissue disorders: Secondary | ICD-10-CM | POA: Diagnosis not present

## 2018-08-31 DIAGNOSIS — E892 Postprocedural hypoparathyroidism: Secondary | ICD-10-CM | POA: Diagnosis present

## 2018-08-31 DIAGNOSIS — I10 Essential (primary) hypertension: Secondary | ICD-10-CM | POA: Diagnosis not present

## 2018-08-31 DIAGNOSIS — E039 Hypothyroidism, unspecified: Secondary | ICD-10-CM | POA: Diagnosis present

## 2018-08-31 DIAGNOSIS — D571 Sickle-cell disease without crisis: Secondary | ICD-10-CM | POA: Diagnosis not present

## 2018-08-31 DIAGNOSIS — R188 Other ascites: Secondary | ICD-10-CM | POA: Diagnosis not present

## 2018-08-31 DIAGNOSIS — Z793 Long term (current) use of hormonal contraceptives: Secondary | ICD-10-CM | POA: Diagnosis not present

## 2018-08-31 DIAGNOSIS — G4733 Obstructive sleep apnea (adult) (pediatric): Secondary | ICD-10-CM | POA: Diagnosis present

## 2018-08-31 DIAGNOSIS — I50813 Acute on chronic right heart failure: Secondary | ICD-10-CM

## 2018-08-31 DIAGNOSIS — Z6841 Body Mass Index (BMI) 40.0 and over, adult: Secondary | ICD-10-CM | POA: Diagnosis not present

## 2018-08-31 DIAGNOSIS — E871 Hypo-osmolality and hyponatremia: Secondary | ICD-10-CM | POA: Diagnosis not present

## 2018-08-31 DIAGNOSIS — R17 Unspecified jaundice: Secondary | ICD-10-CM

## 2018-08-31 DIAGNOSIS — E282 Polycystic ovarian syndrome: Secondary | ICD-10-CM | POA: Diagnosis present

## 2018-08-31 DIAGNOSIS — R0600 Dyspnea, unspecified: Secondary | ICD-10-CM

## 2018-08-31 DIAGNOSIS — I361 Nonrheumatic tricuspid (valve) insufficiency: Secondary | ICD-10-CM | POA: Diagnosis present

## 2018-08-31 DIAGNOSIS — R0603 Acute respiratory distress: Secondary | ICD-10-CM | POA: Diagnosis not present

## 2018-08-31 DIAGNOSIS — D649 Anemia, unspecified: Secondary | ICD-10-CM | POA: Diagnosis present

## 2018-08-31 DIAGNOSIS — I110 Hypertensive heart disease with heart failure: Secondary | ICD-10-CM | POA: Diagnosis present

## 2018-08-31 DIAGNOSIS — Z8249 Family history of ischemic heart disease and other diseases of the circulatory system: Secondary | ICD-10-CM | POA: Diagnosis not present

## 2018-08-31 DIAGNOSIS — R0601 Orthopnea: Secondary | ICD-10-CM | POA: Diagnosis not present

## 2018-08-31 DIAGNOSIS — N179 Acute kidney failure, unspecified: Secondary | ICD-10-CM | POA: Diagnosis not present

## 2018-08-31 DIAGNOSIS — G8929 Other chronic pain: Secondary | ICD-10-CM | POA: Diagnosis present

## 2018-08-31 DIAGNOSIS — Z20828 Contact with and (suspected) exposure to other viral communicable diseases: Secondary | ICD-10-CM | POA: Diagnosis present

## 2018-08-31 DIAGNOSIS — I5033 Acute on chronic diastolic (congestive) heart failure: Secondary | ICD-10-CM | POA: Diagnosis present

## 2018-08-31 DIAGNOSIS — I5082 Biventricular heart failure: Secondary | ICD-10-CM | POA: Diagnosis present

## 2018-08-31 DIAGNOSIS — D638 Anemia in other chronic diseases classified elsewhere: Secondary | ICD-10-CM | POA: Diagnosis present

## 2018-08-31 DIAGNOSIS — Z79890 Hormone replacement therapy (postmenopausal): Secondary | ICD-10-CM | POA: Diagnosis not present

## 2018-08-31 DIAGNOSIS — I272 Pulmonary hypertension, unspecified: Secondary | ICD-10-CM | POA: Diagnosis not present

## 2018-08-31 DIAGNOSIS — Z9049 Acquired absence of other specified parts of digestive tract: Secondary | ICD-10-CM | POA: Diagnosis not present

## 2018-08-31 DIAGNOSIS — D5700 Hb-SS disease with crisis, unspecified: Secondary | ICD-10-CM | POA: Diagnosis present

## 2018-08-31 DIAGNOSIS — Z885 Allergy status to narcotic agent status: Secondary | ICD-10-CM | POA: Diagnosis not present

## 2018-08-31 LAB — CREATININE, URINE, RANDOM: Creatinine, Urine: 174.51 mg/dL

## 2018-08-31 LAB — URINALYSIS, ROUTINE W REFLEX MICROSCOPIC
Bilirubin Urine: NEGATIVE
Glucose, UA: NEGATIVE mg/dL
Ketones, ur: NEGATIVE mg/dL
Leukocytes,Ua: NEGATIVE
Nitrite: NEGATIVE
Protein, ur: 30 mg/dL — AB
Specific Gravity, Urine: 1.012 (ref 1.005–1.030)
pH: 5 (ref 5.0–8.0)

## 2018-08-31 LAB — COMPREHENSIVE METABOLIC PANEL
ALT: 84 U/L — ABNORMAL HIGH (ref 0–44)
AST: 98 U/L — ABNORMAL HIGH (ref 15–41)
Albumin: 3 g/dL — ABNORMAL LOW (ref 3.5–5.0)
Alkaline Phosphatase: 96 U/L (ref 38–126)
Anion gap: 8 (ref 5–15)
BUN: 60 mg/dL — ABNORMAL HIGH (ref 6–20)
CO2: 21 mmol/L — ABNORMAL LOW (ref 22–32)
Calcium: 8.7 mg/dL — ABNORMAL LOW (ref 8.9–10.3)
Chloride: 104 mmol/L (ref 98–111)
Creatinine, Ser: 1.62 mg/dL — ABNORMAL HIGH (ref 0.44–1.00)
GFR calc Af Amer: 49 mL/min — ABNORMAL LOW (ref >60)
GFR calc non Af Amer: 42 mL/min — ABNORMAL LOW (ref >60)
Glucose, Bld: 136 mg/dL — ABNORMAL HIGH (ref 70–99)
Potassium: 5 mmol/L (ref 3.5–5.1)
Sodium: 133 mmol/L — ABNORMAL LOW (ref 135–145)
Total Bilirubin: 8 mg/dL — ABNORMAL HIGH (ref 0.3–1.2)
Total Protein: 9.9 g/dL — ABNORMAL HIGH (ref 6.5–8.1)

## 2018-08-31 LAB — MAGNESIUM: Magnesium: 2.2 mg/dL (ref 1.7–2.4)

## 2018-08-31 LAB — PHOSPHORUS: Phosphorus: 4.9 mg/dL — ABNORMAL HIGH (ref 2.5–4.6)

## 2018-08-31 LAB — TROPONIN I
Troponin I: 0.03 ng/mL (ref <0.03)
Troponin I: 0.03 ng/mL (ref <0.03)
Troponin I: 0.03 ng/mL (ref <0.03)

## 2018-08-31 LAB — CBC
HCT: 16.6 % — ABNORMAL LOW (ref 36.0–46.0)
Hemoglobin: 5.9 g/dL — CL (ref 12.0–15.0)
MCH: 41.5 pg — ABNORMAL HIGH (ref 26.0–34.0)
MCHC: 35.5 g/dL (ref 30.0–36.0)
MCV: 116.9 fL — ABNORMAL HIGH (ref 80.0–100.0)
Platelets: 255 10*3/uL (ref 150–400)
RBC: 1.42 MIL/uL — ABNORMAL LOW (ref 3.87–5.11)
RDW: 23 % — ABNORMAL HIGH (ref 11.5–15.5)
WBC: 18.5 10*3/uL — ABNORMAL HIGH (ref 4.0–10.5)
nRBC: 6.5 % — ABNORMAL HIGH (ref 0.0–0.2)

## 2018-08-31 LAB — SARS CORONAVIRUS 2 BY RT PCR (HOSPITAL ORDER, PERFORMED IN ~~LOC~~ HOSPITAL LAB): SARS Coronavirus 2: NEGATIVE

## 2018-08-31 LAB — TYPE AND SCREEN
ABO/RH(D): B POS
Antibody Screen: NEGATIVE

## 2018-08-31 LAB — OSMOLALITY, URINE: Osmolality, Ur: 351 mOsm/kg (ref 300–900)

## 2018-08-31 LAB — ECHOCARDIOGRAM COMPLETE
Height: 59 in
Weight: 3904 oz

## 2018-08-31 LAB — SODIUM, URINE, RANDOM: Sodium, Ur: <10 mmol/L

## 2018-08-31 LAB — TSH: TSH: 7.375 u[IU]/mL — ABNORMAL HIGH (ref 0.350–4.500)

## 2018-08-31 LAB — HIV ANTIBODY (ROUTINE TESTING W REFLEX): HIV Screen 4th Generation wRfx: NONREACTIVE

## 2018-08-31 MED ORDER — SODIUM CHLORIDE 0.9% FLUSH
10.0000 mL | INTRAVENOUS | Status: DC | PRN
  Administered 2018-09-02: 12:00:00 10 mL
  Filled 2018-08-31: qty 40

## 2018-08-31 NOTE — Progress Notes (Signed)
CRITICAL VALUE ALERT  Critical Value:  Hgb 5.9  Date & Time Notied:  08/31/18 @ 0340  Provider Notified: Tylene Fantasia NP   Orders Received/Actions taken:

## 2018-08-31 NOTE — Progress Notes (Signed)
Pt BP 120/49, pulse 99. K Kirby notified. Will continue to monitor.

## 2018-08-31 NOTE — Progress Notes (Signed)
  Echocardiogram 2D Echocardiogram has been performed.  Pamela Hudson 08/31/2018, 2:41 PM

## 2018-08-31 NOTE — Progress Notes (Signed)
Notified Pamela Hudson troponin 0.03. Awaiting orders.

## 2018-08-31 NOTE — Progress Notes (Signed)
VASCULAR LAB PRELIMINARY  PRELIMINARY  PRELIMINARY  PRELIMINARY  Bilateral lower extremity venous duplex completed.    Preliminary report:  See CV proc for preliminary results.   Alexande Sheerin, RVT 08/31/2018, 9:53 AM

## 2018-08-31 NOTE — Evaluation (Signed)
Occupational Therapy Evaluation Patient Details Name: Pamela Hudson MRN: [id number] DOB: 07/24/1985 Today's Date: 08/31/2018    History of Present Illness 33 y.o. 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  Educated on energy conservation and AE provided. Pt has a tub shower and standard commode.  She does not want bathroom equipment. No further OT is needed at this time     Follow Up Recommendations    no follow up OT   Equipment Recommendations    none recommended by OT   Recommendations for Other Services       Precautions / Restrictions Precautions Precautions: None Restrictions Weight Bearing Restrictions: No      Mobility Bed Mobility               General bed mobility comments: oob  Transfers Overall transfer level: Independent                    Balance                                           ADL either performed or assessed with clinical judgement   ADL Overall ADL's : Needs assistance/impaired                                       General ADL Comments: Pt was having difficulty reaching to start pants, wash R foot and don socks. At baseline, she needed 45 minutes to dress herself.  Issued AE after practicing with it and she is able to use it at mod I level.  Reviewed energy conservation and provided handouts. Pt has a tub/shower. She prefers to stand for shower. Educated that she can sit on toilet and wash legs with long sponge, to save energy, as needed.     Vision         Perception     Praxis      Pertinent Vitals/Pain Pain Assessment: No/denies pain     Hand Dominance     Extremity/Trunk Assessment Upper Extremity Assessment Upper Extremity Assessment: Overall WFL for tasks  assessed   Lower Extremity Assessment Lower Extremity Assessment: Overall WFL for tasks assessed(pt reports edema limiting some motion (also has body habitus))       Communication Communication Communication: No difficulties   Cognition Arousal/Alertness: Awake/alert Behavior During Therapy: WFL for tasks assessed/performed Overall Cognitive Status: Within Functional Limits for tasks assessed                                     General Comments       Exercises     Shoulder Instructions      Home Living Family/patient expects to be discharged to:: Private residence Living Arrangements: Children   Type of Home: Apartment Home Access: Stairs to enter Entergy CorporationEntrance Stairs-Number of Steps: 3rd floor apt Entrance Stairs-Rails: Right       Bathroom Shower/Tub: Chief Strategy OfficerTub/shower unit   Bathroom Toilet: Standard     Home Equipment: None  Prior Functioning/Environment Level of Independence: Independent with assistive device(s)        Comments: reports ADLs and stairs require increased time.  She reports she, boyfriend and 31 year old son all help with housework. She doesn't do the cooking        OT Problem List:        OT Treatment/Interventions:      OT Goals(Current goals can be found in the care plan section)    OT Frequency:     Barriers to D/C:            Co-evaluation              AM-PAC OT "6 Clicks" Daily Activity     Outcome Measure Help from another person eating meals?: None Help from another person taking care of personal grooming?: None Help from another person toileting, which includes using toliet, bedpan, or urinal?: None Help from another person bathing (including washing, rinsing, drying)?: None Help from another person to put on and taking off regular upper body clothing?: None Help from another person to put on and taking off regular lower body clothing?: None 6 Click Score: 24   End of Session    Activity  Tolerance: Patient tolerated treatment well Patient left: in chair;with call bell/phone within reach  OT Visit Diagnosis: Muscle weakness (generalized) (M62.81)                Time: 1200 - 1216   Charges:   1 visit 1 Low evaluation  Lesle Chris, OTR/L Acute Rehabilitation Services [phone] [facility] pager [phone] office 08/31/2018  [place] 08/31/2018, 12:24 PM

## 2018-08-31 NOTE — Progress Notes (Signed)
Subjective: Pamela Hudson, a 33 year old female with a medical history significant for sickle cell disease, pulmonary hypertension, right-sided heart failure, severe sleep apnea, and CHF was admitted overnight for acute on chronic right-sided heart failure.  Patient presented with worsening lower extremity edema and exertional dyspnea.  Patient has gained a significant amount of weight over the past month.  Patient was recently admitted with shortness of breath.  Since that time patient has been taking torsemide consistently.  She has not had any gaps or inconsistencies in home medications.  Patient also states that she has been following a sodium restricted diet.  Patient states that she is continues follow-up with cardiology and has been virtual pulmonary hypertension pressure list at [facility].  Today, patient's hemoglobin is 5.9 which is consistent with baseline.  Patient is followed by [facility] hematology, Dr. Joseph Art.  Patient was last evaluated last month.  Patient was continued on hydroxyurea despite low hemoglobin.  Patient currently denies pain.  She endorses shortness of breath with exertion.  She denies headache, dizziness, chest pain, heart palpitation, dysuria, nausea, vomiting, or diarrhea.  Oxygen saturation 90% on 2 L.  Patient currently afebrile.    Objective:  Vital signs in last 24 hours:  Vitals:   08/31/18 0700 08/31/18 0710 08/31/18 0917 08/31/18 1157  BP: 120/66  (!) 116/58 (!) 119/57  Pulse: 89  (!) 105 (!) 110  Resp: 18  (!) 21 (!) 21  Temp:   98.1 F (36.7 C) 98.2 F (36.8 C)  TempSrc:   Oral Oral  SpO2: (!) 85% 93% 90% 93%  Weight:      Height:        Intake/Output from previous day:   Intake/Output Summary (Last 24 hours) at 08/31/2018 1331 Last data filed at 08/31/2018 1202 Gross per 24 hour  Intake 340 ml  Output 850 ml  Net -510 ml   Physical Exam Constitutional:      Appearance: She is obese.  Eyes:     Pupils: Pupils are equal,  round, and reactive to light.  Cardiovascular:     Rate and Rhythm: Regular rhythm. Tachycardia present.  Pulmonary:     Effort: No tachypnea or bradypnea.     Breath sounds: Examination of the left-upper field reveals rales. Examination of the right-lower field reveals decreased breath sounds. Examination of the left-lower field reveals decreased breath sounds. Decreased breath sounds and rales present.  Abdominal:     General: There is distension.     Tenderness: There is no abdominal tenderness.  Musculoskeletal:     Right lower leg: 3+ Edema present.     Left lower leg: 3+ Edema present.  Neurological:     Mental Status: She is alert.      Lab Results:  Basic Metabolic Panel:    Component Value Date/Time   NA 133 (L) 08/31/2018 0320   K 5.0 08/31/2018 0320   CL 104 08/31/2018 0320   CO2 21 (L) 08/31/2018 0320   BUN 60 (H) 08/31/2018 0320   CREATININE 1.62 (H) 08/31/2018 0320   GLUCOSE 136 (H) 08/31/2018 0320   CALCIUM 8.7 (L) 08/31/2018 0320   CALCIUM 8.4 (L) 03/25/2018 0457   CBC:    Component Value Date/Time   WBC 18.5 (H) 08/31/2018 0320   HGB 5.9 (LL) 08/31/2018 0320   HCT 16.6 (L) 08/31/2018 0320   PLT 255 08/31/2018 0320   MCV 116.9 (H) 08/31/2018 0320   NEUTROABS 11.3 (H) 08/30/2018 1737   LYMPHSABS 4.6 (H)  08/30/2018 1737   MONOABS 2.8 (H) 08/30/2018 1737   EOSABS 0.2 08/30/2018 1737   BASOSABS 0.1 08/30/2018 1737    Recent Results (from the past 240 hour(s))  SARS Coronavirus 2 (Hosp order,Performed in [facility] lab via [employer] ID)     Status: None   Collection Time: 08/30/18  6:19 PM   Specimen: Dry Nasal Swab ([employer] ID Now)  Result Value Ref Range Status   SARS Coronavirus 2 ([employer] ID Now) NEGATIVE NEGATIVE Final    Comment: (NOTE) Interpretive Result Comment(s): COVID 19 Positive SARS CoV 2 target nucleic acids are DETECTED. The SARS CoV 2 RNA is generally detectable in upper and lower respiratory specimens during the acute phase of  infection.  Positive results are indicative of active infection with SARS CoV 2.  Clinical correlation with patient history and other diagnostic information is necessary to determine patient infection status.  Positive results do not rule out bacterial infection or coinfection with other viruses. The expected result is Negative. COVID 19 Negative SARS CoV 2 target nucleic acids are NOT DETECTED. The SARS CoV 2 RNA is generally detectable in upper and lower respiratory specimens during the acute phase of infection.  Negative results do not preclude SARS CoV 2 infection, do not rule out coinfections with other pathogens, and should not be used as the sole basis for treatment or other patient management decisions.  Negative results must be combined with clinical  observations, patient history, and epidemiological information. The expected result is Negative. Invalid Presence or absence of SARS CoV 2 nucleic acids cannot be determined. Repeat testing was performed on the submitted specimen and repeated Invalid results were obtained.  If clinically indicated, additional testing on a new specimen with an alternate test methodology [phone]([id number]) is advised.  The SARS CoV 2 RNA is generally detectable in upper and lower respiratory specimens during the acute phase of infection. The expected result is Negative. Fact Sheet for Patients:  http://www.graves-ford.org/https://www.fda.gov/media/136524/download Fact Sheet for Healthcare Providers: EnviroConcern.sihttps://www.fda.gov/media/136523/download This test is not yet approved or cleared by the [place] FDA and has been authorized for detection and/or diagnosis of SARS CoV 2 by FDA under an Emergency Use Authorization (EUA).  This EUA will remain in effect (meaning this test can be used) for the duration of the COVID19 d eclaration under Section 564(b)(1) of the Act, 21 U.S.C. section [id number] 3(b)(1), unless the authorization is terminated or revoked sooner. Performed at [facility] [place], [address] [place], [place] [address]   SARS Coronavirus 2 (CEPHEID- Performed in [facility] hospital lab), Hosp Order     Status: None   Collection Time: 08/31/18 12:13 AM   Specimen: Nasopharyngeal Swab  Result Value Ref Range Status   SARS Coronavirus 2 NEGATIVE NEGATIVE Final    Comment: (NOTE) If result is NEGATIVE SARS-CoV-2 target nucleic acids are NOT DETECTED. The SARS-CoV-2 RNA is generally detectable in upper and lower  respiratory specimens during the acute phase of infection. The lowest  concentration of SARS-CoV-2 viral copies this assay can detect is 250  copies / mL. A negative result does not preclude SARS-CoV-2 infection  and should not be used as the sole basis for treatment or other  patient management decisions.  A negative result may occur with  improper specimen collection / handling, submission of specimen other  than nasopharyngeal swab, presence of viral mutation(s) within the  areas targeted by this assay, and inadequate number of viral copies  (<250 copies / mL). A negative  result must be combined with clinical  observations, patient history, and epidemiological information. If result is POSITIVE SARS-CoV-2 target nucleic acids are DETECTED. The SARS-CoV-2 RNA is generally detectable in upper and lower  respiratory specimens dur ing the acute phase of infection.  Positive  results are indicative of active infection with SARS-CoV-2.  Clinical  correlation with patient history and other diagnostic information is  necessary to determine patient infection status.  Positive results do  not rule out bacterial infection or co-infection with other viruses. If result is PRESUMPTIVE POSTIVE SARS-CoV-2 nucleic acids MAY BE PRESENT.   A presumptive positive result was obtained on the submitted specimen  and confirmed on repeat testing.  While 2019 novel coronavirus  (SARS-CoV-2) nucleic acids may be present in the submitted sample   additional confirmatory testing may be necessary for epidemiological  and / or clinical management purposes  to differentiate between  SARS-CoV-2 and other Sarbecovirus currently known to infect humans.  If clinically indicated additional testing with an alternate test  methodology [phone]) is advised. The SARS-CoV-2 RNA is generally  detectable in upper and lower respiratory sp ecimens during the acute  phase of infection. The expected result is Negative. Fact Sheet for Patients:  BoilerBrush.com.cy Fact Sheet for Healthcare Providers: https://pope.com/ This test is not yet approved or cleared by the [place] FDA and has been authorized for detection and/or diagnosis of SARS-CoV-2 by FDA under an Emergency Use Authorization (EUA).  This EUA will remain in effect (meaning this test can be used) for the duration of the COVID-19 declaration under Section 564(b)(1) of the Act, 21 U.S.C. section 360bbb-3(b)(1), unless the authorization is terminated or revoked sooner. Performed at [facility], [address], [place], [place] [address]     Studies/Results: Dg Chest Port 1 View  Result Date: 08/30/2018 CLINICAL DATA:  SOB EXAM: PORTABLE CHEST 1 VIEW COMPARISON:  08/08/2018 FINDINGS: There is no focal consolidation. There is no pleural effusion or pneumothorax. There is mild bilateral interstitial prominence. There is stable cardiomegaly. There is a right-sided Port-A-Cath in satisfactory position. There is no acute osseous abnormality. IMPRESSION: Cardiomegaly with mild pulmonary vascular congestion. Electronically Signed   By: Elige Ko   On: 08/30/2018 19:00   Vas [place] Lower Extremity Venous (dvt)  Result Date: 08/31/2018  Lower Venous Study Indications: Swelling.  Risk Factors: CHF. Limitations: Body habitus and swelling. Comparison Study: Prior negative study from 02/17/18 is available. Performing Technologist:  Sherren Kerns RVS  Examination Guidelines: A complete evaluation includes B-mode imaging, spectral Doppler, color Doppler, and power Doppler as needed of all accessible portions of each vessel. Bilateral testing is considered an integral part of a complete examination. Limited examinations for reoccurring indications may be performed as noted.  +---------+---------------+---------+-----------+----------+--------------+ RIGHT    CompressibilityPhasicitySpontaneityPropertiesSummary        +---------+---------------+---------+-----------+----------+--------------+ CFV                                                   pulsatile      +---------+---------------+---------+-----------+----------+--------------+ FV Prox                                               pulsatile      +---------+---------------+---------+-----------+----------+--------------+ FV Mid  pulsatile      +---------+---------------+---------+-----------+----------+--------------+ FV Distal                                             pulsatile      +---------+---------------+---------+-----------+----------+--------------+ PFV                                                   Not visualized +---------+---------------+---------+-----------+----------+--------------+ POP      Full                                         pulsatile      +---------+---------------+---------+-----------+----------+--------------+ PTV                                                   Not visualized +---------+---------------+---------+-----------+----------+--------------+ PERO                                                  Not visualized +---------+---------------+---------+-----------+----------+--------------+   Right Technical Findings: Not visualized segments include profunda, peroneal, posterior tibial veins.   +---------+---------------+---------+-----------+----------+--------------+ LEFT     CompressibilityPhasicitySpontaneityPropertiesSummary        +---------+---------------+---------+-----------+----------+--------------+ CFV                                                   pulsatile      +---------+---------------+---------+-----------+----------+--------------+ FV Prox                                               pulsatile      +---------+---------------+---------+-----------+----------+--------------+ FV Mid                                                pulsatile      +---------+---------------+---------+-----------+----------+--------------+ FV Distal                                             pulsatile      +---------+---------------+---------+-----------+----------+--------------+ PFV                                                   Not visualized +---------+---------------+---------+-----------+----------+--------------+ POP  pulsatile      +---------+---------------+---------+-----------+----------+--------------+ PTV                                                   Not visualized +---------+---------------+---------+-----------+----------+--------------+ PERO                                                  Not visualized +---------+---------------+---------+-----------+----------+--------------+     Summary: Right: There is no evidence of deep vein thrombosis in the lower extremity. However, portions of this examination were limited- see technologist comments above. Left: There is no evidence of deep vein thrombosis in the lower extremity. However, portions of this examination were limited- see technologist comments above.  *See table(s) above for measurements and observations.    Preliminary     Medications: Scheduled Meds: . Deferasirox  1,080 mg Oral Daily  . enoxaparin (LOVENOX)  injection  30 mg Subcutaneous QHS  . folic acid  1 mg Oral Daily  . furosemide  40 mg Intravenous Q12H  . metoprolol succinate  12.5 mg Oral Daily   Continuous Infusions: PRN Meds:.ondansetron **OR** ondansetron (ZOFRAN) IV, sodium chloride flush  Consultants:  Cardiology consult  Assessment/Plan: Active Problems:   Sickle cell anemia (HCC)   Elevated bilirubin   Hyponatremia   Leukocytosis   AKI (acute kidney injury) (HCC)   Dyspnea   Essential hypertension   Acute on chronic right-sided heart failure (HCC)   Elevated troponin  Acute on chronic right-sided heart failure: Continue telemetry Consult to cardiology.  Dr. Prescott ParmaJay Gandhi was notified and will consult today. Continue furosemide 40 mg IV every 12 hours Continue daily weights Continue serial EKGs Cardiac enzymes have been cycled, troponin 0 0.03, suspected to be related to ischemia Sodium restricted diet Echocardiogram pending.  Patient underwent echocardiogram in April 2020 which showed  EF 65 to 70%.  Severe tricuspid regurgitation.  Mild concentric left ventricular hypertrophy.  Diastolic function normal.  Right ventricular pressure and volume overload  Sickle cell disease: Stable, patient is not in sickle cell crisis.  She denies pain. Continue folic acid 1 mg for bone marrow support. Restart hydroxyurea Oxycodone 5 mg every 6 hours as needed for moderate to severe pain Hemoglobin 5.9, consistent with baseline.  Will manage anemia conservatively.  Repeat CBC in a.m.  Essential hypertension: Stable.  Continue home medications.  Acute kidney injury: Continue to monitor creatinine carefully while diuresing Hold deferasirox, GFR 42 Repeat creatinine in a.m.   History of pulmonary hypertension: Patient is followed by outpatient pulmonology.  She underwent chest CT on 08/08/2018 which showed no pulmonary nodules, enlarged pulmonary arteries noted.  Patient sleep study showed severe sleep apnea.  At that time,  patient was referred to a specialist specifically for pulmonary hypertension.  Obstructive sleep apnea: CPAP at bedtime per respiratory therapy  Hypothyroidism: TSH elevated, continue Synthroid.  Leukocytosis: WBCs improved from previous.  No signs of infection.  Patient afebrile.  Patient afebrile.  Suspected to be related to sickle cell disease.   Code Status: Full Code Family Communication: N/A Disposition Plan: Not yet ready for discharge  Nolon NationsLachina Moore Aadon Gorelik  APRN, MSN, FNP-C [facility] [address]  , [place] [address] [phone]  If 5PM-7AM, please contact night-coverage.  08/31/2018, 1:31 PM  LOS: 0 days

## 2018-08-31 NOTE — Evaluation (Signed)
Physical Therapy One Time Evaluation Patient Details Name: Pamela Hudson MRN: [id number] DOB: [birth year] Today's Date: 08/31/2018   History of Present Illness  33 y.o. female with medical history significant of sickle cell disease, pulmonary hypertension resulting in right-sided heart failure and CHF and admitted for acute on chronic right sided heart failure  Clinical Impression  Patient evaluated by Physical Therapy with no further acute PT needs identified. All education has been completed and the patient has no further questions.  Pt tolerated mobility well however required a couple rest breaks for breathing.  SPO2 90-98% on room air during ambulation.  Pt agreeable to no further PT needs and encouraged to ambulate with nursing staff during acute stay.  See below for any follow-up Physical Therapy or equipment needs. PT is signing off. Thank you for this referral.     Follow Up Recommendations No PT follow up    Equipment Recommendations  None recommended by PT    Recommendations for Other Services       Precautions / Restrictions Precautions Precautions: None Restrictions Weight Bearing Restrictions: No      Mobility  Bed Mobility Overal bed mobility: Modified Independent             General bed mobility comments: oob  Transfers Overall transfer level: Modified independent                  Ambulation/Gait Ambulation/Gait assistance: Supervision Gait Distance (Feet): 320 Feet Assistive device: None Gait Pattern/deviations: WFL(Within Functional Limits)     General Gait Details: pt had no LOB, required 4 standing rest breaks for breathing, SPO2 90-98% during ambulation, HR 129 bpm  Stairs            Wheelchair Mobility    Modified Rankin (Stroke Patients Only)       Balance Overall balance assessment: No apparent balance deficits (not formally assessed)                                           Pertinent Vitals/Pain  Pain Assessment: No/denies pain    Home Living Family/patient expects to be discharged to:: Private residence Living Arrangements: Children   Type of Home: Apartment Home Access: Stairs to enter Entrance Stairs-Rails: Right Entrance Stairs-Number of Steps: 3rd floor apt   Home Equipment: None      Prior Function Level of Independence: Independent with assistive device(s)         Comments: reports ADLs and stairs require increased time.  She reports she, boyfriend and 71 year old son all help with housework. She doesn't do the cooking     Hand Dominance        Extremity/Trunk Assessment   Upper Extremity Assessment Upper Extremity Assessment: Overall WFL for tasks assessed    Lower Extremity Assessment Lower Extremity Assessment: Overall WFL for tasks assessed(pt reports edema limiting some motion (also has body habitus))       Communication   Communication: No difficulties  Cognition Arousal/Alertness: Awake/alert Behavior During Therapy: WFL for tasks assessed/performed Overall Cognitive Status: Within Functional Limits for tasks assessed                                        General Comments      Exercises     Assessment/Plan  PT Assessment Patent does not need any further PT services  PT Problem List         PT Treatment Interventions      PT Goals (Current goals can be found in the Care Plan section)  Acute Rehab PT Goals PT Goal Formulation: All assessment and education complete, DC therapy    Frequency     Barriers to discharge        Co-evaluation               AM-PAC PT "6 Clicks" Mobility  Outcome Measure Help needed turning from your back to your side while in a flat bed without using bedrails?: None Help needed moving from lying on your back to sitting on the side of a flat bed without using bedrails?: A Little Help needed moving to and from a bed to a chair (including a wheelchair)?: A Little Help  needed standing up from a chair using your arms (e.g., wheelchair or bedside chair)?: A Little Help needed to walk in hospital room?: A Little Help needed climbing 3-5 steps with a railing? : A Little 6 Click Score: 19    End of Session   Activity Tolerance: Patient tolerated treatment well Patient left: in chair;with chair alarm set;with call bell/phone within reach Nurse Communication: Mobility status PT Visit Diagnosis: Difficulty in walking, not elsewhere classified (R26.2)    Time: [id number] PT Time Calculation (min) (ACUTE ONLY): 15 min   Charges:   PT Evaluation $PT Eval Low Complexity: 1 Low          Zenovia JarredKati Navia Lindahl, PT, DPT Acute Rehabilitation Services Office: [phone] Pager: [phone]  Sarajane JewsLEMYRE,KATHrine E 08/31/2018, 1:23 PM

## 2018-08-31 NOTE — Progress Notes (Signed)
PHARMACY BRIEF NOTE: HYDROXYUREA   By [facility] policy, hydroxyurea is automatically held when any of the following laboratory values occur:  ANC < 2 K  Pltc < 80K in sickle-cell patients; < 100K in other patients  Hgb <= 6 in sickle-cell patients; < 8 in other patients  Reticulocytes < 80K when Hgb < 9  Hydroxyurea has been held (discontinued from profile) per policy.   Eudelia Bunch, Pharm.D 08/31/2018 12:01 AM

## 2018-09-01 ENCOUNTER — Inpatient Hospital Stay (HOSPITAL_COMMUNITY): Payer: Medicaid Other

## 2018-09-01 ENCOUNTER — Encounter (HOSPITAL_COMMUNITY)
Admission: EM | Disposition: A | Discharge status: Home / Self Care | Payer: Self-pay | Source: Home / Self Care | Attending: Internal Medicine

## 2018-09-01 DIAGNOSIS — R188 Other ascites: Secondary | ICD-10-CM

## 2018-09-01 DIAGNOSIS — R0603 Acute respiratory distress: Secondary | ICD-10-CM

## 2018-09-01 DIAGNOSIS — R0601 Orthopnea: Secondary | ICD-10-CM

## 2018-09-01 DIAGNOSIS — I272 Pulmonary hypertension, unspecified: Secondary | ICD-10-CM

## 2018-09-01 HISTORY — PX: RIGHT HEART CATH: CATH118263

## 2018-09-01 LAB — POCT I-STAT EG7
Acid-base deficit: 3 mmol/L — ABNORMAL HIGH (ref 0.0–2.0)
Acid-base deficit: 4 mmol/L — ABNORMAL HIGH (ref 0.0–2.0)
Acid-base deficit: 4 mmol/L — ABNORMAL HIGH (ref 0.0–2.0)
Bicarbonate: 21.2 mmol/L (ref 20.0–28.0)
Bicarbonate: 21.7 mmol/L (ref 20.0–28.0)
Bicarbonate: 21.8 mmol/L (ref 20.0–28.0)
Calcium, Ion: 1.21 mmol/L (ref 1.15–1.40)
Calcium, Ion: 1.24 mmol/L (ref 1.15–1.40)
Calcium, Ion: 1.25 mmol/L (ref 1.15–1.40)
HCT: 23 % — ABNORMAL LOW (ref 36.0–46.0)
HCT: 23 % — ABNORMAL LOW (ref 36.0–46.0)
HCT: 24 % — ABNORMAL LOW (ref 36.0–46.0)
Hemoglobin: 7.8 g/dL — ABNORMAL LOW (ref 12.0–15.0)
Hemoglobin: 7.8 g/dL — ABNORMAL LOW (ref 12.0–15.0)
Hemoglobin: 8.2 g/dL — ABNORMAL LOW (ref 12.0–15.0)
O2 Saturation: 65 %
O2 Saturation: 66 %
O2 Saturation: 67 %
Potassium: 5.6 mmol/L — ABNORMAL HIGH (ref 3.5–5.1)
Potassium: 5.6 mmol/L — ABNORMAL HIGH (ref 3.5–5.1)
Potassium: 5.7 mmol/L — ABNORMAL HIGH (ref 3.5–5.1)
Sodium: 137 mmol/L (ref 135–145)
Sodium: 137 mmol/L (ref 135–145)
Sodium: 138 mmol/L (ref 135–145)
TCO2: 22 mmol/L (ref 22–32)
TCO2: 23 mmol/L (ref 22–32)
TCO2: 23 mmol/L (ref 22–32)
pCO2, Ven: 38 mmHg — ABNORMAL LOW (ref 44.0–60.0)
pCO2, Ven: 38.9 mmHg — ABNORMAL LOW (ref 44.0–60.0)
pCO2, Ven: 39.1 mmHg — ABNORMAL LOW (ref 44.0–60.0)
pH, Ven: 7.353 (ref 7.250–7.430)
pH, Ven: 7.355 (ref 7.250–7.430)
pH, Ven: 7.356 (ref 7.250–7.430)
pO2, Ven: 35 mmHg (ref 32.0–45.0)
pO2, Ven: 36 mmHg (ref 32.0–45.0)
pO2, Ven: 36 mmHg (ref 32.0–45.0)

## 2018-09-01 LAB — PREGNANCY, URINE: Preg Test, Ur: NEGATIVE

## 2018-09-01 LAB — CBC
HCT: 17.5 % — ABNORMAL LOW (ref 36.0–46.0)
HCT: 17.2 % — ABNORMAL LOW (ref 36.0–46.0)
Hemoglobin: 6.1 g/dL — CL (ref 12.0–15.0)
Hemoglobin: 6.1 g/dL — CL (ref 12.0–15.0)
MCH: 40.4 pg — ABNORMAL HIGH (ref 26.0–34.0)
MCH: 40.1 pg — ABNORMAL HIGH (ref 26.0–34.0)
MCHC: 34.9 g/dL (ref 30.0–36.0)
MCHC: 35.5 g/dL (ref 30.0–36.0)
MCV: 115.9 fL — ABNORMAL HIGH (ref 80.0–100.0)
MCV: 113.2 fL — ABNORMAL HIGH (ref 80.0–100.0)
Platelets: 247 10*3/uL (ref 150–400)
Platelets: 256 10*3/uL (ref 150–400)
RBC: 1.51 MIL/uL — ABNORMAL LOW (ref 3.87–5.11)
RBC: 1.52 MIL/uL — ABNORMAL LOW (ref 3.87–5.11)
RDW: 22.8 % — ABNORMAL HIGH (ref 11.5–15.5)
RDW: 22.5 % — ABNORMAL HIGH (ref 11.5–15.5)
WBC: 16.8 10*3/uL — ABNORMAL HIGH (ref 4.0–10.5)
WBC: 18.3 10*3/uL — ABNORMAL HIGH (ref 4.0–10.5)
nRBC: 7.1 % — ABNORMAL HIGH (ref 0.0–0.2)
nRBC: 7.4 % — ABNORMAL HIGH (ref 0.0–0.2)

## 2018-09-01 LAB — DIFFERENTIAL
Basophils Absolute: 0.1 10*3/uL (ref 0.0–0.1)
Basophils Relative: 1 %
Eosinophils Absolute: 0.3 10*3/uL (ref 0.0–0.5)
Eosinophils Relative: 2 %
Lymphocytes Relative: 21 %
Lymphs Abs: 3.5 10*3/uL (ref 0.7–4.0)
Monocytes Absolute: 2.3 10*3/uL — ABNORMAL HIGH (ref 0.1–1.0)
Monocytes Relative: 14 %
Neutro Abs: 10.2 10*3/uL — ABNORMAL HIGH (ref 1.7–7.7)
Neutrophils Relative %: 61 %

## 2018-09-01 LAB — BASIC METABOLIC PANEL
Anion gap: 10 (ref 5–15)
BUN: 67 mg/dL — ABNORMAL HIGH (ref 6–20)
CO2: 20 mmol/L — ABNORMAL LOW (ref 22–32)
Calcium: 8.5 mg/dL — ABNORMAL LOW (ref 8.9–10.3)
Chloride: 103 mmol/L (ref 98–111)
Creatinine, Ser: 1.75 mg/dL — ABNORMAL HIGH (ref 0.44–1.00)
GFR calc Af Amer: 44 mL/min — ABNORMAL LOW (ref >60)
GFR calc non Af Amer: 38 mL/min — ABNORMAL LOW (ref >60)
Glucose, Bld: 121 mg/dL — ABNORMAL HIGH (ref 70–99)
Potassium: 5 mmol/L (ref 3.5–5.1)
Sodium: 133 mmol/L — ABNORMAL LOW (ref 135–145)

## 2018-09-01 LAB — CREATININE, SERUM
Creatinine, Ser: 1.97 mg/dL — ABNORMAL HIGH (ref 0.44–1.00)
GFR calc Af Amer: 38 mL/min — ABNORMAL LOW (ref >60)
GFR calc non Af Amer: 33 mL/min — ABNORMAL LOW (ref >60)

## 2018-09-01 LAB — BRAIN NATRIURETIC PEPTIDE: B Natriuretic Peptide: 405.1 pg/mL — ABNORMAL HIGH (ref 0.0–100.0)

## 2018-09-01 SURGERY — RIGHT HEART CATH

## 2018-09-01 MED ORDER — FUROSEMIDE 10 MG/ML IJ SOLN: 80.0000 mg | Freq: Two times a day (BID) | INTRAMUSCULAR | Status: DC

## 2018-09-01 MED ORDER — POTASSIUM CHLORIDE 20 MEQ PO PACK
20.0000 meq | PACK | Freq: Two times a day (BID) | ORAL | Status: DC
  Administered 2018-09-01: 20 meq via ORAL
  Filled 2018-09-01 (×2): qty 1

## 2018-09-01 MED ORDER — MAGNESIUM OXIDE 400 (241.3 MG) MG PO TABS
400.0000 mg | ORAL_TABLET | Freq: Every day | ORAL | Status: DC
  Administered 2018-09-01 – 2018-09-08 (×8): 400 mg via ORAL
  Filled 2018-09-01 (×8): qty 1

## 2018-09-01 MED ORDER — FUROSEMIDE 10 MG/ML IJ SOLN
80.0000 mg | Freq: Two times a day (BID) | INTRAMUSCULAR | Status: DC
  Administered 2018-09-01 – 2018-09-02 (×2): 80 mg via INTRAVENOUS
  Filled 2018-09-01 (×2): qty 8

## 2018-09-01 MED ORDER — SODIUM CHLORIDE 0.9% FLUSH
3.0000 mL | Freq: Two times a day (BID) | INTRAVENOUS | Status: DC
  Administered 2018-09-01 – 2018-09-07 (×7): 3 mL via INTRAVENOUS

## 2018-09-01 MED ORDER — ENOXAPARIN SODIUM 30 MG/0.3ML ~~LOC~~ SOLN: 30.0000 mg | SUBCUTANEOUS | Status: DC

## 2018-09-01 MED ORDER — LIDOCAINE HCL (PF) 1 % IJ SOLN
INTRAMUSCULAR | Status: DC | PRN
  Administered 2018-09-01: 2 mL via SUBCUTANEOUS

## 2018-09-01 MED ORDER — LIDOCAINE HCL (PF) 1 % IJ SOLN
INTRAMUSCULAR | Status: AC
  Filled 2018-09-01: qty 30

## 2018-09-01 MED ORDER — HEPARIN (PORCINE) IN NACL 1000-0.9 UT/500ML-% IV SOLN
INTRAVENOUS | Status: DC | PRN
  Administered 2018-09-01: 500 mL

## 2018-09-01 MED ORDER — MILRINONE LACTATE IN DEXTROSE 20-5 MG/100ML-% IV SOLN
0.2500 ug/kg/min | INTRAVENOUS | Status: DC
  Administered 2018-09-01 – 2018-09-02 (×2): 0.25 ug/kg/min via INTRAVENOUS
  Filled 2018-09-01 (×2): qty 100

## 2018-09-01 MED ORDER — HEPARIN (PORCINE) IN NACL 1000-0.9 UT/500ML-% IV SOLN
INTRAVENOUS | Status: AC
  Filled 2018-09-01: qty 500

## 2018-09-01 SURGICAL SUPPLY — 3 items
CATH BALLN WEDGE 5F 110CM (CATHETERS) ×3 IMPLANT
PACK CARDIAC CATHETERIZATION (CUSTOM PROCEDURE TRAY) ×3 IMPLANT
SHEATH GLIDE SLENDER 4/5FR (SHEATH) ×3 IMPLANT

## 2018-09-01 NOTE — Interval H&P Note (Signed)
History and Physical Interval Note:  09/01/2018 4:44 PM  [name]  has presented today for surgery, with the diagnosis of Pulmonary Hypertention.  The various methods of treatment have been discussed with the patient and family. After consideration of risks, benefits and other options for treatment, the patient has consented to  Procedure(s): RIGHT HEART CATH (N/A) as a surgical intervention.  The patient's history has been reviewed, patient examined, no change in status, stable for surgery.  I have reviewed the patient's chart and labs.  Questions were answered to the patient's satisfaction.     Pamela Hudson

## 2018-09-01 NOTE — Progress Notes (Signed)
Subjective: Pamela Hudson, a 33 year old female with a medical history significant for sickle cell disease, hemochromatosis, severe obstructive sleep apnea, morbid obesity, pulmonary hypertension, and right-sided heart failure admitted with worsening leg edema and shortness of breath.  Patient has a very complex medical history and was evaluated by cardiology for evaluation of worsening right heart failure.  Patient denies pain today but complains of worsening bilateral lower extremity edema, abdominal distention, and shortness of breath with exertion. Oxygen saturation is 100% on RA.  Patient's hemoglobin has improved to 6.1, which is consistent with patient's baseline.  Patient denies headache, chest pain, dysuria, nausea, vomiting, or diarrhea.  Objective:  Vital signs in last 24 hours:  Vitals:   08/31/18 1758 08/31/18 2234 09/01/18 0240 09/01/18 0635  BP: 129/74 125/67 129/64 (!) 106/44  Pulse: (!) 103 98 96 100  Resp: (!) (!) 21  Temp: 98.5 F (36.9 C) 98.8 F (37.1 C) 98.7 F (37.1 C) (!) 97.4 F (36.3 C)  TempSrc: Oral Oral Oral Oral  SpO2: 93% 96% 100% 98%  Weight:    111.2 kg  Height:        Intake/Output from previous day:   Intake/Output Summary (Last 24 hours) at 09/01/2018 1058 Last data filed at 09/01/2018 0903 Gross per 24 hour  Intake 600 ml  Output 1700 ml  Net -1100 ml   Physical Exam Constitutional:      Appearance: Normal appearance. She is obese.  HENT:     Head: Normocephalic.     Nose: Nose normal.     Mouth/Throat:     Mouth: Mucous membranes are moist.     Pharynx: Oropharynx is clear.  Eyes:     General: Scleral icterus present.     Pupils: Pupils are equal, round, and reactive to light.  Cardiovascular:     Rate and Rhythm: Normal rate and regular rhythm.  Pulmonary:     Effort: Pulmonary effort is normal.     Breath sounds: Examination of the right-lower field reveals decreased breath sounds. Examination of the left-lower  field reveals decreased breath sounds. Decreased breath sounds present.  Abdominal:     General: Bowel sounds are normal. There is distension.     Palpations: There is hepatomegaly.     Tenderness: There is abdominal tenderness in the right upper quadrant.  Musculoskeletal:     Right lower leg: 3+ Edema present.     Left lower leg: 3+ Edema present.  Neurological:     Mental Status: She is alert.     Lab Results:  Basic Metabolic Panel:    Component Value Date/Time   NA 133 (L) 09/01/2018 0924   K 5.0 09/01/2018 0924   CL 103 09/01/2018 0924   CO2 20 (L) 09/01/2018 0924   BUN 67 (H) 09/01/2018 0924   CREATININE 1.75 (H) 09/01/2018 0924   GLUCOSE 121 (H) 09/01/2018 0924   CALCIUM 8.5 (L) 09/01/2018 0924   CALCIUM 8.4 (L) 03/25/2018 0457   CBC:    Component Value Date/Time   WBC 16.8 (H) 09/01/2018 0924   HGB 6.1 (LL) 09/01/2018 0924   HCT 17.5 (L) 09/01/2018 0924   PLT 247 09/01/2018 0924   MCV 115.9 (H) 09/01/2018 0924   NEUTROABS 11.3 (H) 08/30/2018 1737   LYMPHSABS 4.6 (H) 08/30/2018 1737   MONOABS 2.8 (H) 08/30/2018 1737   EOSABS 0.2 08/30/2018 1737   BASOSABS 0.1 08/30/2018 1737    Recent Results (from the past 240 hour(s))  SARS Coronavirus 2 (  Hosp order,Performed in [facility] lab via [employer] ID)     Status: None   Collection Time: 08/30/18  6:19 PM   Specimen: Dry Nasal Swab ([employer] ID Now)  Result Value Ref Range Status   SARS Coronavirus 2 ([employer] Now) NEGATIVE NEGATIVE Final    Comment: (NOTE) Interpretive Result Comment(s): COVID 19 Positive SARS CoV 2 target nucleic acids are DETECTED. The SARS CoV 2 RNA is generally detectable in upper and lower respiratory specimens during the acute phase of infection.  Positive results are indicative of active infection with SARS CoV 2.  Clinical correlation with patient history and other diagnostic information is necessary to determine patient infection status.  Positive results do not rule out bacterial  infection or coinfection with other viruses. The expected result is Negative. COVID 19 Negative SARS CoV 2 target nucleic acids are NOT DETECTED. The SARS CoV 2 RNA is generally detectable in upper and lower respiratory specimens during the acute phase of infection.  Negative results do not preclude SARS CoV 2 infection, do not rule out coinfections with other pathogens, and should not be used as the sole basis for treatment or other patient management decisions.  Negative results must be combined with clinical  observations, patient history, and epidemiological information. The expected result is Negative. Invalid Presence or absence of SARS CoV 2 nucleic acids cannot be determined. Repeat testing was performed on the submitted specimen and repeated Invalid results were obtained.  If clinically indicated, additional testing on a new specimen with an alternate test methodology [phone]([id number]) is advised.  The SARS CoV 2 RNA is generally detectable in upper and lower respiratory specimens during the acute phase of infection. The expected result is Negative. Fact Sheet for Patients:  http://www.graves-ford.org/https://www.fda.gov/media/136524/download Fact Sheet for Healthcare Providers: EnviroConcern.sihttps://www.fda.gov/media/136523/download This test is not yet approved or cleared by the [place] FDA and has been authorized for detection and/or diagnosis of SARS CoV 2 by FDA under an Emergency Use Authorization (EUA).  This EUA will remain in effect (meaning this test can be used) for the duration of the COVID19 d eclaration under Section 564(b)(1) of the Act, 21 U.S.C. section [id number] 3(b)(1), unless the authorization is terminated or revoked sooner. Performed at [facility] [place], [address] [place], [place] [address]   SARS Coronavirus 2 (CEPHEID- Performed in [facility] hospital lab), Hosp Order     Status: None   Collection Time: 08/31/18 12:13 AM   Specimen: Nasopharyngeal Swab  Result Value Ref Range  Status   SARS Coronavirus 2 NEGATIVE NEGATIVE Final    Comment: (NOTE) If result is NEGATIVE SARS-CoV-2 target nucleic acids are NOT DETECTED. The SARS-CoV-2 RNA is generally detectable in upper and lower  respiratory specimens during the acute phase of infection. The lowest  concentration of SARS-CoV-2 viral copies this assay can detect is 250  copies / mL. A negative result does not preclude SARS-CoV-2 infection  and should not be used as the sole basis for treatment or other  patient management decisions.  A negative result may occur with  improper specimen collection / handling, submission of specimen other  than nasopharyngeal swab, presence of viral mutation(s) within the  areas targeted by this assay, and inadequate number of viral copies  (<250 copies / mL). A negative result must be combined with clinical  observations, patient history, and epidemiological information. If result is POSITIVE SARS-CoV-2 target nucleic acids are DETECTED. The SARS-CoV-2 RNA is generally detectable in upper and lower  respiratory  specimens dur ing the acute phase of infection.  Positive  results are indicative of active infection with SARS-CoV-2.  Clinical  correlation with patient history and other diagnostic information is  necessary to determine patient infection status.  Positive results do  not rule out bacterial infection or co-infection with other viruses. If result is PRESUMPTIVE POSTIVE SARS-CoV-2 nucleic acids MAY BE PRESENT.   A presumptive positive result was obtained on the submitted specimen  and confirmed on repeat testing.  While 2019 novel coronavirus  (SARS-CoV-2) nucleic acids may be present in the submitted sample  additional confirmatory testing may be necessary for epidemiological  and / or clinical management purposes  to differentiate between  SARS-CoV-2 and other Sarbecovirus currently known to infect humans.  If clinically indicated additional testing with an alternate  test  methodology [phone]) is advised. The SARS-CoV-2 RNA is generally  detectable in upper and lower respiratory sp ecimens during the acute  phase of infection. The expected result is Negative. Fact Sheet for Patients:  StrictlyIdeas.no Fact Sheet for Healthcare Providers: BankingDealers.co.za This test is not yet approved or cleared by the [place] FDA and has been authorized for detection and/or diagnosis of SARS-CoV-2 by FDA under an Emergency Use Authorization (EUA).  This EUA will remain in effect (meaning this test can be used) for the duration of the COVID-19 declaration under Section 564(b)(1) of the Act, 21 U.S.C. section 360bbb-3(b)(1), unless the authorization is terminated or revoked sooner. Performed at [facility], [place] [address] [place], [place] [address]     Studies/Results: Dg Chest Port 1 View  Result Date: 08/30/2018 CLINICAL DATA:  SOB EXAM: PORTABLE CHEST 1 VIEW COMPARISON:  08/08/2018 FINDINGS: There is no focal consolidation. There is no pleural effusion or pneumothorax. There is mild bilateral interstitial prominence. There is stable cardiomegaly. There is a right-sided Port-A-Cath in satisfactory position. There is no acute osseous abnormality. IMPRESSION: Cardiomegaly with mild pulmonary vascular congestion. Electronically Signed   By: Kathreen Devoid   On: 08/30/2018 19:00   Vas [place] Lower Extremity Venous (dvt)  Result Date: 08/31/2018  Lower Venous Study Indications: Swelling.  Risk Factors: CHF. Limitations: Body habitus and swelling. Comparison Study: Prior negative study from 02/17/18 is available. Performing Technologist: Sharion Dove RVS  Examination Guidelines: A complete evaluation includes B-mode imaging, spectral Doppler, color Doppler, and power Doppler as needed of all accessible portions of each vessel. Bilateral testing is considered an integral part of a complete examination.  Limited examinations for reoccurring indications may be performed as noted.  +---------+---------------+---------+-----------+----------+--------------+ RIGHT    CompressibilityPhasicitySpontaneityPropertiesSummary        +---------+---------------+---------+-----------+----------+--------------+ CFV                                                   pulsatile      +---------+---------------+---------+-----------+----------+--------------+ FV Prox                                               pulsatile      +---------+---------------+---------+-----------+----------+--------------+ FV Mid  pulsatile      +---------+---------------+---------+-----------+----------+--------------+ FV Distal                                             pulsatile      +---------+---------------+---------+-----------+----------+--------------+ PFV                                                   Not visualized +---------+---------------+---------+-----------+----------+--------------+ POP      Full                                         pulsatile      +---------+---------------+---------+-----------+----------+--------------+ PTV                                                   Not visualized +---------+---------------+---------+-----------+----------+--------------+ PERO                                                  Not visualized +---------+---------------+---------+-----------+----------+--------------+   Right Technical Findings: Not visualized segments include profunda, peroneal, posterior tibial veins.  +---------+---------------+---------+-----------+----------+--------------+ LEFT     CompressibilityPhasicitySpontaneityPropertiesSummary        +---------+---------------+---------+-----------+----------+--------------+ CFV                                                   pulsatile       +---------+---------------+---------+-----------+----------+--------------+ FV Prox                                               pulsatile      +---------+---------------+---------+-----------+----------+--------------+ FV Mid                                                pulsatile      +---------+---------------+---------+-----------+----------+--------------+ FV Distal                                             pulsatile      +---------+---------------+---------+-----------+----------+--------------+ PFV                                                   Not visualized +---------+---------------+---------+-----------+----------+--------------+ POP  pulsatile      +---------+---------------+---------+-----------+----------+--------------+ PTV                                                   Not visualized +---------+---------------+---------+-----------+----------+--------------+ PERO                                                  Not visualized +---------+---------------+---------+-----------+----------+--------------+     Summary: Right: There is no evidence of deep vein thrombosis in the lower extremity. However, portions of this examination were limited- see technologist comments above. Left: There is no evidence of deep vein thrombosis in the lower extremity. However, portions of this examination were limited- see technologist comments above.  *See table(s) above for measurements and observations. Electronically signed by Lemar LivingsBrandon Cain MD on 08/31/2018 at 4:10:52 PM.    Final     Medications: Scheduled Meds: . enoxaparin (LOVENOX) injection  30 mg Subcutaneous QHS  . folic acid  1 mg Oral Daily  . furosemide  40 mg Intravenous Q12H  . metoprolol succinate  12.5 mg Oral Daily  . sodium chloride flush  3 mL Intravenous Q12H   Continuous Infusions: PRN Meds:.ondansetron **OR** ondansetron (ZOFRAN) IV,  sodium chloride flush  Consultants:  Cardiology  Procedures:  Scheduled for abdominal ultrasound  Scheduled for right heart cath  Assessment/Plan: Active Problems:   Sickle cell anemia (HCC)   Elevated bilirubin   Hyponatremia   Leukocytosis   AKI (acute kidney injury) (HCC)   Dyspnea   Essential hypertension   Anemia   Acute on chronic right-sided heart failure (HCC)   Elevated troponin  Acute on chronic right-sided heart failure: Patient was evaluated by cardiologist, Dr. Prescott ParmaJay Gandhi.  Recommended that [facility] cardiac [facility] for higher level of care. Patient will undergo right heart cath on today. Continue furosemide 40 mg IV every 12 hours Daily weights. Patient had weight gain of 1 pound overnight.  Continue sodium restricted diet.  Sickle cell disease: Patient has severe anemia with macrocytosis. MCV increased, patient has been compliant with hydroxyurea.  Patient's baseline appears to be between 5.5-6.5.  Hemoglobin has improved to 6.1 today.  Will manage anemia conservatively. Stable.  Patient is not in sickle cell crisis.  She currently denies pain.  Continue folic acid for bone marrow support. Oxycodone 5 mg every 6 hours as needed for moderate to severe pain.  Follow CBC in a.m.  Essential hypertension: Stable.  Continue home medications.  Acute kidney injury: Continue to monitor creatinine carefully while diuresing. Creatinine increased from previous 1.6>1.7 Continue to hold deferasirox Follow creatinine in a.m.  Pulmonary hypertension: Echocardiogram on 08/31/2018 is worsening pulmonary hypertension.  Patient underwent chest CT on 08/08/2018, which showed no pulmonary nodules, enlarged pulmonary arteries noted.In reviewing [facility], patient has a positive ANA.  Connective tissue disease needs to be ruled out. Will order connective tissue disease cascade panel.   Patient will undergo right-sided heart cath  today.   Obstructive sleep apnea: Patient was determined to have severe obstructive sleep apnea on 08/08/2018.  Continue CPAP at bedtime per respiratory therapist.  Hypothyroidism: Stable.  Continue levothyroxine.  Leukocytosis: WBCs trending down appropriately.  No signs of infection.  Patient  afebrile.  Suspected to be related to sickle cell disease. Add on differential to CBC  Abdominal tenderness:  Hepatomegaly, RUQ abdominal tenderness.  Follow complete abdominal ultrasound. Review as results become available.   Code Status: Full Code Family Communication: N/A Disposition Plan: Not yet ready for discharge   Giovanny Dugal Rennis Petty  APRN, MSN, FNP-C [facility] [address] [place], [place] [address] [phone]   If 5PM-7AM, please contact night-coverage.  09/01/2018, 10:58 AM  LOS: 1 day

## 2018-09-01 NOTE — Consult Note (Addendum)
CARDIOLOGY CONSULT NOTE  Patient ID: Pamela Hudson MRN: [id number] DOB/AGE: 1985/07/29 33 y.o.  Admit date: 08/30/2018 Referring Physician  Cammie Sickle, NP-C Primary Physician:  Curt Jews, PA-C Reason for Consultation  CHF  HPI: Pamela Hudson  is a 33 y.o. 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  I was consulted for evaluation of CHF.  She is feeling better, still has marked dyspnea and generalized weakness.   Past Medical History:  Diagnosis Date  . Cardiomegaly   . CHF (congestive heart failure) ([place])   . Chronic pain   . CVA (cerebral infarction) 02/2002  . Hypertension   . Liver disease    ?iron  . PCOS (polycystic ovarian syndrome)   . Renal stone   . Sickle cell anemia ([facility])     Past Surgical History:  Procedure Laterality Date  . CHOLECYSTECTOMY    . LIVER BIOPSY  2007  . PARATHYROIDECTOMY    . PORTACATH PLACEMENT      Social History   Socioeconomic History  . Marital status: Single    Spouse name: Not on file  . Number of children: Not on file  . Years of education: Not on file  . Highest education level: Not on file  Occupational History  . Not on file  Social Needs  . Financial resource strain: Not on file  . Food insecurity    Worry: Not on file    Inability: Not on file  . Transportation needs    Medical: Not on file    Non-medical: Not on file  Tobacco Use  . Smoking status: Former Research scientist (life sciences)  . Smokeless tobacco: Never Used  Substance and Sexual Activity  . Alcohol use: Yes    Comment: occ  . Drug use: No  . Sexual activity: Yes    Birth control/protection: Implant  Lifestyle  . Physical activity    Days per week: Not on file    Minutes per session: Not on file  . Stress: Not on file  Relationships  . Social Herbalist on  phone: Not on file    Gets together: Not on file    Attends religious service: Not on file    Active member of club or organization: Not on file    Attends meetings of clubs or organizations: Not on file    Relationship status: Not on file  . Intimate partner violence    Fear of current or ex partner: Not on file    Emotionally abused: Not on file    Physically abused: Not on file    Forced sexual activity: Not on file  Other Topics Concern  . Not on file  Social History Narrative  . Not on file    No current facility-administered medications on file prior to encounter.    Current Outpatient Medications on File Prior to Encounter  Medication Sig Dispense Refill  . Deferasirox (JADENU) 360 MG TABS Take 1,080 mg by mouth daily.     . Etonogestrel (IMPLANON [place]) Inject 1 each into the skin once. Implanted spring [id number]    . folic acid (FOLVITE) 1 MG tablet Take 1 mg by mouth daily.    . hydroxyurea (HYDREA) 500 MG capsule Take 1,000 mg by mouth daily.     . magnesium oxide (MAG-OX) 400 MG tablet Take 400 mg by mouth  daily.  5  . metoprolol succinate (TOPROL XL) 25 MG 24 hr tablet Take 0.5 tablets (12.5 mg total) by mouth daily. 30 tablet 1  . torsemide (DEMADEX) 20 MG tablet Take 40 mg by mouth daily.    . Vitamin D, Ergocalciferol, (DRISDOL) 1.25 MG (50000 UT) CAPS capsule Take 100,000 Units by mouth every Sunday. 2 capsules  11  . furosemide (LASIX) 20 MG tablet Take 2 tablets (40 mg total) by mouth 2 (two) times daily. (Patient not taking: Reported on 08/30/2018) 60 tablet 0   Review of Systems  Constitution: Positive for malaise/fatigue. Negative for chills, decreased appetite and weight gain.  Cardiovascular: Positive for dyspnea on exertion and leg swelling. Negative for syncope.  Endocrine: Negative for cold intolerance.  Hematologic/Lymphatic: Does not bruise/bleed easily.  Musculoskeletal: Negative for joint swelling.  Gastrointestinal: Negative for abdominal pain, anorexia,  change in bowel habit, hematochezia and melena.  Neurological: Negative for headaches and light-headedness.  Psychiatric/Behavioral: Positive for depression. Negative for substance abuse. The patient is nervous/anxious.   All other systems reviewed and are negative.     Objective:  Blood pressure 129/64, pulse 96, temperature 98.7 F (37.1 C), temperature source Oral, resp. rate 20, height 4' 11" (1.499 m), weight 110.7 kg, SpO2 100 %. Body mass index is 49.28 kg/m.  Physical Exam  Constitutional: She is oriented to person, place, and time. She appears well-developed and well-nourished. No distress.  Short stature, Morbidly obese  HENT:  Head: Atraumatic.  There is bilateral icterus.  Eyes: Conjunctivae are normal. Scleral icterus is present.  Neck: Neck supple. JVD present. No thyromegaly present.  Short neck and difficult to evaluate JVP  Cardiovascular: Normal rate, regular rhythm, S1 normal and S2 normal. Exam reveals no gallop.  Murmur heard.  Midsystolic murmur is present at the lower left sternal border. Pulses:      Carotid pulses are 2+ on the right side and 2+ on the left side.      Dorsalis pedis pulses are 2+ on the right side and 2+ on the left side.       Posterior tibial pulses are 2+ on the right side and 2+ on the left side.  Femoral and popliteal pulse difficult to feel due to patient's body habitus.  Hyperdynamic precordium  Pulmonary/Chest: Effort normal and breath sounds normal.  Abdominal: Soft. Normal appearance and bowel sounds are normal. There is hepatomegaly. There is abdominal tenderness in the right upper quadrant.  Obese. Pannus present. Abdominal wall edema present. ? ascites  Musculoskeletal: Normal range of motion.        General: No edema.  Neurological: She is alert and oriented to person, place, and time.  Skin: Skin is warm and dry.  Psychiatric: She has a normal mood and affect.    Radiology: Dg Chest Port 1 View  Result Date:  08/30/2018 CLINICAL DATA:  SOB EXAM: PORTABLE CHEST 1 VIEW COMPARISON:  08/08/2018 FINDINGS: There is no focal consolidation. There is no pleural effusion or pneumothorax. There is mild bilateral interstitial prominence. There is stable cardiomegaly. There is a right-sided Port-A-Cath in satisfactory position. There is no acute osseous abnormality. IMPRESSION: Cardiomegaly with mild pulmonary vascular congestion. Electronically Signed   By: Kathreen Devoid   On: 08/30/2018 19:00   Vas [place] Lower Extremity Venous (dvt)  Result Date: 08/31/2018  Lower Venous Study Indications: Swelling.  Risk Factors: CHF. Limitations: Body habitus and swelling. Comparison Study: Prior negative study from 02/17/18 is available. Performing Technologist: Sharion Dove RVS  Examination Guidelines: A complete evaluation includes B-mode imaging, spectral Doppler, color Doppler, and power Doppler as needed of all accessible portions of each vessel. Bilateral testing is considered an integral part of a complete examination. Limited examinations for reoccurring indications may be performed as noted.  +---------+---------------+---------+-----------+----------+--------------+ RIGHT    CompressibilityPhasicitySpontaneityPropertiesSummary        +---------+---------------+---------+-----------+----------+--------------+ CFV                                                   pulsatile      +---------+---------------+---------+-----------+----------+--------------+ FV Prox                                               pulsatile      +---------+---------------+---------+-----------+----------+--------------+ FV Mid                                                pulsatile      +---------+---------------+---------+-----------+----------+--------------+ FV Distal                                             pulsatile      +---------+---------------+---------+-----------+----------+--------------+ PFV                                                    Not visualized +---------+---------------+---------+-----------+----------+--------------+ POP      Full                                         pulsatile      +---------+---------------+---------+-----------+----------+--------------+ PTV                                                   Not visualized +---------+---------------+---------+-----------+----------+--------------+ PERO                                                  Not visualized +---------+---------------+---------+-----------+----------+--------------+   Right Technical Findings: Not visualized segments include profunda, peroneal, posterior tibial veins.  +---------+---------------+---------+-----------+----------+--------------+ LEFT     CompressibilityPhasicitySpontaneityPropertiesSummary        +---------+---------------+---------+-----------+----------+--------------+ CFV                                                   pulsatile      +---------+---------------+---------+-----------+----------+--------------+ FV Prox  pulsatile      +---------+---------------+---------+-----------+----------+--------------+ FV Mid                                                pulsatile      +---------+---------------+---------+-----------+----------+--------------+ FV Distal                                             pulsatile      +---------+---------------+---------+-----------+----------+--------------+ PFV                                                   Not visualized +---------+---------------+---------+-----------+----------+--------------+ POP                                                   pulsatile      +---------+---------------+---------+-----------+----------+--------------+ PTV                                                   Not visualized  +---------+---------------+---------+-----------+----------+--------------+ PERO                                                  Not visualized +---------+---------------+---------+-----------+----------+--------------+     Summary: Right: There is no evidence of deep vein thrombosis in the lower extremity. However, portions of this examination were limited- see technologist comments above. Left: There is no evidence of deep vein thrombosis in the lower extremity. However, portions of this examination were limited- see technologist comments above.  *See table(s) above for measurements and observations. Electronically signed by Servando Snare MD on 08/31/2018 at 4:10:52 PM.    Final    Laboratory Examination:  CMP Latest Ref Rng & Units 08/31/2018 08/30/2018 07/12/2018  Glucose 70 - 99 mg/dL 136(H) 140(H) 107(H)  BUN 6 - 20 mg/dL 60(H) 59(H) 26(H)  Creatinine 0.44 - 1.00 mg/dL 1.62(H) 1.58(H) 1.12(H)  Sodium 135 - 145 mmol/L 133(L) 132(L) 134(L)  Potassium 3.5 - 5.1 mmol/L 5.0 4.7 5.3(H)  Chloride 98 - 111 mmol/L 104 104 102  CO2 22 - 32 mmol/L 21(L) 20(L) 22  Calcium 8.9 - 10.3 mg/dL 8.7(L) 8.7(L) 9.0  Total Protein 6.5 - 8.1 g/dL 9.9(H) 10.1(H) -  Total Bilirubin 0.3 - 1.2 mg/dL 8.0(H) 8.8(H) -  Alkaline Phos 38 - 126 U/L 96 107 -  AST 15 - 41 U/L 98(H) 103(H) -  ALT 0 - 44 U/L 84(H) 84(H) -   CBC Latest Ref Rng & Units 08/31/2018 08/30/2018 07/12/2018  WBC 4.0 - 10.5 K/uL 18.5(H) 19.2(H) 20.6(H)  Hemoglobin 12.0 - 15.0 g/dL 5.9(LL) 5.8(LL) 6.0(LL)  Hematocrit 36.0 - 46.0 % 16.6(L) 16.9(L) 16.9(L)  Platelets 150 - 400 K/uL 255 250 268   Lipid Panel  Component Value Date/Time   CHOL 87 02/17/2018 1609   TRIG 87 02/17/2018 1609   HDL <10 (L) 02/17/2018 1609   CHOLHDL NOT CALCULATED 02/17/2018 1609   VLDL 17 02/17/2018 1609   LDLCALC NOT CALCULATED 02/17/2018 1609   HEMOGLOBIN A1C No results found for: HGBA1C, MPG TSH Recent Labs    02/18/18 0435 08/31/18 0320  TSH 5.241*  7.375*   Cardiac Panel (last 3 results) Recent Labs    08/31/18 0320 08/31/18 0801 08/31/18 1312  TROPONINI 0.03* 0.03* 0.03*   Scheduled Meds: . enoxaparin (LOVENOX) injection  30 mg Subcutaneous QHS  . folic acid  1 mg Oral Daily  . furosemide  40 mg Intravenous Q12H  . metoprolol succinate  12.5 mg Oral Daily   Continuous Infusions: PRN Meds:.ondansetron **OR** ondansetron (ZOFRAN) IV, sodium chloride flush Medications Discontinued During This Encounter  Medication Reason  . Melatonin 10 MG TABS Patient Preference  . hydroxyurea (HYDREA) capsule 1,000 mg Lab parameters not met  . Deferasirox TABS 1,080 mg    Current Meds  Medication Sig  . Deferasirox (JADENU) 360 MG TABS Take 1,080 mg by mouth daily.   . Etonogestrel (IMPLANON [place]) Inject 1 each into the skin once. Implanted spring [id number]  . folic acid (FOLVITE) 1 MG tablet Take 1 mg by mouth daily.  . hydroxyurea (HYDREA) 500 MG capsule Take 1,000 mg by mouth daily.   . magnesium oxide (MAG-OX) 400 MG tablet Take 400 mg by mouth daily.  . metoprolol succinate (TOPROL XL) 25 MG 24 hr tablet Take 0.5 tablets (12.5 mg total) by mouth daily.  Marland Kitchen torsemide (DEMADEX) 20 MG tablet Take 40 mg by mouth daily.  . Vitamin D, Ergocalciferol, (DRISDOL) 1.25 MG (50000 UT) CAPS capsule Take 100,000 Units by mouth every Sunday. 2 capsules   Cardiac studies:   Echocardiogram 08/30/2018 :   1. The left ventricle has normal systolic function with an ejection fraction of 60-65%. The cavity size was normal. There is mildly increased left ventricular wall thickness. Left ventricular diastolic Doppler parameters are indeterminate. No evidence  of left ventricular regional wall motion abnormalities.  2. The right ventricle has normal systolic function. The cavity was mildly enlarged. There is no increase in right ventricular wall thickness.  3. Left atrial size was mildly dilated.  4. Right atrial size was mildly dilated.  5. Trivial pericardial  effusion is present.  Echocardiogram 02/17/2018 : - Left ventricle: Abnormal septal motion. The cavity size was   normal. Systolic function was normal. The estimated ejection   fraction was in the range of 55% to 60%. Wall motion was normal;   there were no regional wall motion abnormalities. - Mitral valve: There was mild regurgitation. - Tricuspid valve: There was severe regurgitation. - Pulmonary arteries: PA peak pressure: 56 mm Hg (S). - Recommendations: Limited views only one apical view and no   subcostal views. There is severe TR moderate pulmolnary HTN    Lower Extremity Venous Duplex 08/31/2018: Negative for DVT bilateral.  EKG 08/29/2018: Sinus tachycardia, RAD, low voltage complexes, no ischemia.   Assessment:   1. 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 Severe obstructive sleep apnea 3. 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  4. Severe anemia with macrocytosis. Chronic elevation in WBC.  5. Acute renal failure probably related to aggressive diuressive. 6. H/O stroke in 02/2002 and 05/12/2015 without deficits. 7. ANA positive Plan:  Extremely complex  patient with about comorbidity referred for evaluation of acute cor pulmonale on chronic cor pulmonale with right-sided heart failure.  She has had good diuresis with resultant decreased renal function, f/u BMP today. In view of restrictive lung disease, severe sleep apnea, morbid obesity, therapy with vasodilators (Revatio) for PH would probably be contraindicated. Her symptoms are exacerbated by severe anemia, non compliance with diet and weight loss. Will check VQ scan for CETP and we could do right heart cath for evaluation of the [place]. Will schedule this afternoon and make further recommendations.  VQ scan takes precedence and if not able to be transferred to [facility], can do [place] Monday as she will be here for fluid management. If renal function  is stable would add Aldactone. I am beginning to think she may be developing cirrhosis.  Check US abdomen. Complex consult > 1 hour in duration >50% time spent face to face.  Right heart cath Discussed risks, benefits and alternatives risks  <1% risk of death, pericardial tamponade, hematoma but not limited to these discussed. patient is willing to proceed.   Adrian Prows, MD, [facility] 09/01/2018, 5:32 AM [facility] [place] Pager: [phone] Office: [phone] If no answer Cell [phone]

## 2018-09-01 NOTE — Progress Notes (Signed)
RN contacted [facility] 6E to give report on patient.  [facility] communicated with cath lab, where patient will go directly prior to [place].  RN also communicated with [facility] when they arrived to take patient to [facility].  Patient vitals stable. All patient belongings sent with patient and [facility].

## 2018-09-01 NOTE — H&P (View-Only) (Signed)
CARDIOLOGY CONSULT NOTE  Patient ID: Pamela Hudson MRN: [id number] DOB/AGE: [birth year] 33 y.o.  Admit date: 08/30/2018 Referring Physician  Cammie Sickle, NP-C Primary Physician:  Curt Jews, PA-C Reason for Consultation  CHF  HPI: Pamela Hudson  is a 33 y.o. 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  I was consulted for evaluation of CHF.  She is feeling better, still has marked dyspnea and generalized weakness.   Past Medical History:  Diagnosis Date  . Cardiomegaly   . CHF (congestive heart failure) ([place])   . Chronic pain   . CVA (cerebral infarction) 02/2002  . Hypertension   . Liver disease    ?iron  . PCOS (polycystic ovarian syndrome)   . Renal stone   . Sickle cell anemia ([facility])     Past Surgical History:  Procedure Laterality Date  . CHOLECYSTECTOMY    . LIVER BIOPSY  2007  . PARATHYROIDECTOMY    . PORTACATH PLACEMENT      Social History   Socioeconomic History  . Marital status: Single    Spouse name: Not on file  . Number of children: Not on file  . Years of education: Not on file  . Highest education level: Not on file  Occupational History  . Not on file  Social Needs  . Financial resource strain: Not on file  . Food insecurity    Worry: Not on file    Inability: Not on file  . Transportation needs    Medical: Not on file    Non-medical: Not on file  Tobacco Use  . Smoking status: Former Research scientist (life sciences)  . Smokeless tobacco: Never Used  Substance and Sexual Activity  . Alcohol use: Yes    Comment: occ  . Drug use: No  . Sexual activity: Yes    Birth control/protection: Implant  Lifestyle  . Physical activity    Days per week: Not on file    Minutes per session: Not on file  . Stress: Not on file  Relationships  . Social Herbalist on  phone: Not on file    Gets together: Not on file    Attends religious service: Not on file    Active member of club or organization: Not on file    Attends meetings of clubs or organizations: Not on file    Relationship status: Not on file  . Intimate partner violence    Fear of current or ex partner: Not on file    Emotionally abused: Not on file    Physically abused: Not on file    Forced sexual activity: Not on file  Other Topics Concern  . Not on file  Social History Narrative  . Not on file    No current facility-administered medications on file prior to encounter.    Current Outpatient Medications on File Prior to Encounter  Medication Sig Dispense Refill  . Deferasirox (JADENU) 360 MG TABS Take 1,080 mg by mouth daily.     . Etonogestrel (IMPLANON [place]) Inject 1 each into the skin once. Implanted spring [id number]    . folic acid (FOLVITE) 1 MG tablet Take 1 mg by mouth daily.    . hydroxyurea (HYDREA) 500 MG capsule Take 1,000 mg by mouth daily.     . magnesium oxide (MAG-OX) 400 MG tablet Take 400 mg by mouth  daily.  5  . metoprolol succinate (TOPROL XL) 25 MG 24 hr tablet Take 0.5 tablets (12.5 mg total) by mouth daily. 30 tablet 1  . torsemide (DEMADEX) 20 MG tablet Take 40 mg by mouth daily.    . Vitamin D, Ergocalciferol, (DRISDOL) 1.25 MG (50000 UT) CAPS capsule Take 100,000 Units by mouth every Sunday. 2 capsules  11  . furosemide (LASIX) 20 MG tablet Take 2 tablets (40 mg total) by mouth 2 (two) times daily. (Patient not taking: Reported on 08/30/2018) 60 tablet 0   Review of Systems  Constitution: Positive for malaise/fatigue. Negative for chills, decreased appetite and weight gain.  Cardiovascular: Positive for dyspnea on exertion and leg swelling. Negative for syncope.  Endocrine: Negative for cold intolerance.  Hematologic/Lymphatic: Does not bruise/bleed easily.  Musculoskeletal: Negative for joint swelling.  Gastrointestinal: Negative for abdominal pain, anorexia,  change in bowel habit, hematochezia and melena.  Neurological: Negative for headaches and light-headedness.  Psychiatric/Behavioral: Positive for depression. Negative for substance abuse. The patient is nervous/anxious.   All other systems reviewed and are negative.     Objective:  Blood pressure 129/64, pulse 96, temperature 98.7 F (37.1 C), temperature source Oral, resp. rate 20, height 4' 11" (1.499 m), weight 110.7 kg, SpO2 100 %. Body mass index is 49.28 kg/m.  Physical Exam  Constitutional: She is oriented to person, place, and time. She appears well-developed and well-nourished. No distress.  Short stature, Morbidly obese  HENT:  Head: Atraumatic.  There is bilateral icterus.  Eyes: Conjunctivae are normal. Scleral icterus is present.  Neck: Neck supple. JVD present. No thyromegaly present.  Short neck and difficult to evaluate JVP  Cardiovascular: Normal rate, regular rhythm, S1 normal and S2 normal. Exam reveals no gallop.  Murmur heard.  Midsystolic murmur is present at the lower left sternal border. Pulses:      Carotid pulses are 2+ on the right side and 2+ on the left side.      Dorsalis pedis pulses are 2+ on the right side and 2+ on the left side.       Posterior tibial pulses are 2+ on the right side and 2+ on the left side.  Femoral and popliteal pulse difficult to feel due to patient's body habitus.  Hyperdynamic precordium  Pulmonary/Chest: Effort normal and breath sounds normal.  Abdominal: Soft. Normal appearance and bowel sounds are normal. There is hepatomegaly. There is abdominal tenderness in the right upper quadrant.  Obese. Pannus present. Abdominal wall edema present. ? ascites  Musculoskeletal: Normal range of motion.        General: No edema.  Neurological: She is alert and oriented to person, place, and time.  Skin: Skin is warm and dry.  Psychiatric: She has a normal mood and affect.    Radiology: Dg Chest Port 1 View  Result Date:  08/30/2018 CLINICAL DATA:  SOB EXAM: PORTABLE CHEST 1 VIEW COMPARISON:  08/08/2018 FINDINGS: There is no focal consolidation. There is no pleural effusion or pneumothorax. There is mild bilateral interstitial prominence. There is stable cardiomegaly. There is a right-sided Port-A-Cath in satisfactory position. There is no acute osseous abnormality. IMPRESSION: Cardiomegaly with mild pulmonary vascular congestion. Electronically Signed   By: Kathreen Devoid   On: 08/30/2018 19:00   Vas [place] Lower Extremity Venous (dvt)  Result Date: 08/31/2018  Lower Venous Study Indications: Swelling.  Risk Factors: CHF. Limitations: Body habitus and swelling. Comparison Study: Prior negative study from 02/17/18 is available. Performing Technologist: Sharion Dove RVS  Examination Guidelines: A complete evaluation includes B-mode imaging, spectral Doppler, color Doppler, and power Doppler as needed of all accessible portions of each vessel. Bilateral testing is considered an integral part of a complete examination. Limited examinations for reoccurring indications may be performed as noted.  +---------+---------------+---------+-----------+----------+--------------+ RIGHT    CompressibilityPhasicitySpontaneityPropertiesSummary        +---------+---------------+---------+-----------+----------+--------------+ CFV                                                   pulsatile      +---------+---------------+---------+-----------+----------+--------------+ FV Prox                                               pulsatile      +---------+---------------+---------+-----------+----------+--------------+ FV Mid                                                pulsatile      +---------+---------------+---------+-----------+----------+--------------+ FV Distal                                             pulsatile      +---------+---------------+---------+-----------+----------+--------------+ PFV                                                    Not visualized +---------+---------------+---------+-----------+----------+--------------+ POP      Full                                         pulsatile      +---------+---------------+---------+-----------+----------+--------------+ PTV                                                   Not visualized +---------+---------------+---------+-----------+----------+--------------+ PERO                                                  Not visualized +---------+---------------+---------+-----------+----------+--------------+   Right Technical Findings: Not visualized segments include profunda, peroneal, posterior tibial veins.  +---------+---------------+---------+-----------+----------+--------------+ LEFT     CompressibilityPhasicitySpontaneityPropertiesSummary        +---------+---------------+---------+-----------+----------+--------------+ CFV                                                   pulsatile      +---------+---------------+---------+-----------+----------+--------------+ FV Prox  pulsatile      +---------+---------------+---------+-----------+----------+--------------+ FV Mid                                                pulsatile      +---------+---------------+---------+-----------+----------+--------------+ FV Distal                                             pulsatile      +---------+---------------+---------+-----------+----------+--------------+ PFV                                                   Not visualized +---------+---------------+---------+-----------+----------+--------------+ POP                                                   pulsatile      +---------+---------------+---------+-----------+----------+--------------+ PTV                                                   Not visualized  +---------+---------------+---------+-----------+----------+--------------+ PERO                                                  Not visualized +---------+---------------+---------+-----------+----------+--------------+     Summary: Right: There is no evidence of deep vein thrombosis in the lower extremity. However, portions of this examination were limited- see technologist comments above. Left: There is no evidence of deep vein thrombosis in the lower extremity. However, portions of this examination were limited- see technologist comments above.  *See table(s) above for measurements and observations. Electronically signed by Servando Snare MD on 08/31/2018 at 4:10:52 PM.    Final    Laboratory Examination:  CMP Latest Ref Rng & Units 08/31/2018 08/30/2018 07/12/2018  Glucose 70 - 99 mg/dL 136(H) 140(H) 107(H)  BUN 6 - 20 mg/dL 60(H) 59(H) 26(H)  Creatinine 0.44 - 1.00 mg/dL 1.62(H) 1.58(H) 1.12(H)  Sodium 135 - 145 mmol/L 133(L) 132(L) 134(L)  Potassium 3.5 - 5.1 mmol/L 5.0 4.7 5.3(H)  Chloride 98 - 111 mmol/L 104 104 102  CO2 22 - 32 mmol/L 21(L) 20(L) 22  Calcium 8.9 - 10.3 mg/dL 8.7(L) 8.7(L) 9.0  Total Protein 6.5 - 8.1 g/dL 9.9(H) 10.1(H) -  Total Bilirubin 0.3 - 1.2 mg/dL 8.0(H) 8.8(H) -  Alkaline Phos 38 - 126 U/L 96 107 -  AST 15 - 41 U/L 98(H) 103(H) -  ALT 0 - 44 U/L 84(H) 84(H) -   CBC Latest Ref Rng & Units 08/31/2018 08/30/2018 07/12/2018  WBC 4.0 - 10.5 K/uL 18.5(H) 19.2(H) 20.6(H)  Hemoglobin 12.0 - 15.0 g/dL 5.9(LL) 5.8(LL) 6.0(LL)  Hematocrit 36.0 - 46.0 % 16.6(L) 16.9(L) 16.9(L)  Platelets 150 - 400 K/uL 255 250 268   Lipid Panel  Component Value Date/Time   CHOL 87 02/17/2018 1609   TRIG 87 02/17/2018 1609   HDL <10 (L) 02/17/2018 1609   CHOLHDL NOT CALCULATED 02/17/2018 1609   VLDL 17 02/17/2018 1609   LDLCALC NOT CALCULATED 02/17/2018 1609   HEMOGLOBIN A1C No results found for: HGBA1C, MPG TSH Recent Labs    02/18/18 0435 08/31/18 0320  TSH 5.241*  7.375*   Cardiac Panel (last 3 results) Recent Labs    08/31/18 0320 08/31/18 0801 08/31/18 1312  TROPONINI 0.03* 0.03* 0.03*   Scheduled Meds: . enoxaparin (LOVENOX) injection  30 mg Subcutaneous QHS  . folic acid  1 mg Oral Daily  . furosemide  40 mg Intravenous Q12H  . metoprolol succinate  12.5 mg Oral Daily   Continuous Infusions: PRN Meds:.ondansetron **OR** ondansetron (ZOFRAN) IV, sodium chloride flush Medications Discontinued During This Encounter  Medication Reason  . Melatonin 10 MG TABS Patient Preference  . hydroxyurea (HYDREA) capsule 1,000 mg Lab parameters not met  . Deferasirox TABS 1,080 mg    Current Meds  Medication Sig  . Deferasirox (JADENU) 360 MG TABS Take 1,080 mg by mouth daily.   . Etonogestrel (IMPLANON [place]) Inject 1 each into the skin once. Implanted spring [id number]  . folic acid (FOLVITE) 1 MG tablet Take 1 mg by mouth daily.  . hydroxyurea (HYDREA) 500 MG capsule Take 1,000 mg by mouth daily.   . magnesium oxide (MAG-OX) 400 MG tablet Take 400 mg by mouth daily.  . metoprolol succinate (TOPROL XL) 25 MG 24 hr tablet Take 0.5 tablets (12.5 mg total) by mouth daily.  Marland Kitchen torsemide (DEMADEX) 20 MG tablet Take 40 mg by mouth daily.  . Vitamin D, Ergocalciferol, (DRISDOL) 1.25 MG (50000 UT) CAPS capsule Take 100,000 Units by mouth every Sunday. 2 capsules   Cardiac studies:   Echocardiogram 08/30/2018 :   1. The left ventricle has normal systolic function with an ejection fraction of 60-65%. The cavity size was normal. There is mildly increased left ventricular wall thickness. Left ventricular diastolic Doppler parameters are indeterminate. No evidence  of left ventricular regional wall motion abnormalities.  2. The right ventricle has normal systolic function. The cavity was mildly enlarged. There is no increase in right ventricular wall thickness.  3. Left atrial size was mildly dilated.  4. Right atrial size was mildly dilated.  5. Trivial pericardial  effusion is present.  Echocardiogram 02/17/2018 : - Left ventricle: Abnormal septal motion. The cavity size was   normal. Systolic function was normal. The estimated ejection   fraction was in the range of 55% to 60%. Wall motion was normal;   there were no regional wall motion abnormalities. - Mitral valve: There was mild regurgitation. - Tricuspid valve: There was severe regurgitation. - Pulmonary arteries: PA peak pressure: 56 mm Hg (S). - Recommendations: Limited views only one apical view and no   subcostal views. There is severe TR moderate pulmolnary HTN    Lower Extremity Venous Duplex 08/31/2018: Negative for DVT bilateral.  EKG 08/29/2018: Sinus tachycardia, RAD, low voltage complexes, no ischemia.   Assessment:   1. 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 Severe obstructive sleep apnea 3. 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  4. Severe anemia with macrocytosis. Chronic elevation in WBC.  5. Acute renal failure probably related to aggressive diuressive. 6. H/O stroke in 02/2002 and 05/12/2015 without deficits. 7. ANA positive Plan:  Extremely complex  patient with about comorbidity referred for evaluation of acute cor pulmonale on chronic cor pulmonale with right-sided heart failure.  She has had good diuresis with resultant decreased renal function, f/u BMP today. In view of restrictive lung disease, severe sleep apnea, morbid obesity, therapy with vasodilators (Revatio) for PH would probably be contraindicated. Her symptoms are exacerbated by severe anemia, non compliance with diet and weight loss. Will check VQ scan for CETP and we could do right heart cath for evaluation of the [place]. Will schedule this afternoon and make further recommendations.  VQ scan takes precedence and if not able to be transferred to [facility], can do [place] Monday as she will be here for fluid management. If renal function  is stable would add Aldactone. I am beginning to think she may be developing cirrhosis.  Check US abdomen. Complex consult > 1 hour in duration >50% time spent face to face.  Right heart cath Discussed risks, benefits and alternatives risks  <1% risk of death, pericardial tamponade, hematoma but not limited to these discussed. patient is willing to proceed.   Adrian Prows, MD, [facility] 09/01/2018, 5:32 AM [facility] [place] Pager: [phone] Office: [phone] If no answer Cell [phone]

## 2018-09-02 ENCOUNTER — Inpatient Hospital Stay: Payer: Self-pay

## 2018-09-02 ENCOUNTER — Inpatient Hospital Stay (HOSPITAL_COMMUNITY): Payer: Medicaid Other

## 2018-09-02 LAB — CBC
HCT: 17 % — ABNORMAL LOW (ref 36.0–46.0)
Hemoglobin: 6 g/dL — CL (ref 12.0–15.0)
MCH: 39.7 pg — ABNORMAL HIGH (ref 26.0–34.0)
MCHC: 35.3 g/dL (ref 30.0–36.0)
MCV: 112.6 fL — ABNORMAL HIGH (ref 80.0–100.0)
Platelets: 259 10*3/uL (ref 150–400)
RBC: 1.51 MIL/uL — ABNORMAL LOW (ref 3.87–5.11)
RDW: 21.3 % — ABNORMAL HIGH (ref 11.5–15.5)
WBC: 15.8 10*3/uL — ABNORMAL HIGH (ref 4.0–10.5)
nRBC: 8.7 % — ABNORMAL HIGH (ref 0.0–0.2)

## 2018-09-02 LAB — BASIC METABOLIC PANEL
Anion gap: 9 (ref 5–15)
BUN: 63 mg/dL — ABNORMAL HIGH (ref 6–20)
CO2: 21 mmol/L — ABNORMAL LOW (ref 22–32)
Calcium: 8.9 mg/dL (ref 8.9–10.3)
Chloride: 102 mmol/L (ref 98–111)
Creatinine, Ser: 1.88 mg/dL — ABNORMAL HIGH (ref 0.44–1.00)
GFR calc Af Amer: 41 mL/min — ABNORMAL LOW (ref >60)
GFR calc non Af Amer: 35 mL/min — ABNORMAL LOW (ref >60)
Glucose, Bld: 186 mg/dL — ABNORMAL HIGH (ref 70–99)
Potassium: 5.3 mmol/L — ABNORMAL HIGH (ref 3.5–5.1)
Sodium: 132 mmol/L — ABNORMAL LOW (ref 135–145)

## 2018-09-02 LAB — COOXEMETRY PANEL
Carboxyhemoglobin: 5.1 % (ref 0.5–1.5)
Methemoglobin: 1.6 % — ABNORMAL HIGH (ref 0.0–1.5)
O2 Saturation: 68.5 %
Total hemoglobin: 6 g/dL — CL (ref 12.0–16.0)

## 2018-09-02 LAB — BRAIN NATRIURETIC PEPTIDE: B Natriuretic Peptide: 444.9 pg/mL — ABNORMAL HIGH (ref 0.0–100.0)

## 2018-09-02 MED ORDER — FUROSEMIDE 10 MG/ML IJ SOLN
15.0000 mg/h | INTRAVENOUS | Status: DC
  Administered 2018-09-02 – 2018-09-05 (×7): 20 mg/h via INTRAVENOUS
  Administered 2018-09-07: 16:00:00 15 mg/h via INTRAVENOUS
  Filled 2018-09-02 (×4): qty 25
  Filled 2018-09-02: qty 20
  Filled 2018-09-02 (×11): qty 25

## 2018-09-02 MED ORDER — METOLAZONE 5 MG PO TABS
5.0000 mg | ORAL_TABLET | Freq: Two times a day (BID) | ORAL | Status: DC
  Administered 2018-09-02 – 2018-09-05 (×7): 5 mg via ORAL
  Filled 2018-09-02 (×7): qty 1

## 2018-09-02 MED ORDER — HEPARIN SOD (PORK) LOCK FLUSH 100 UNIT/ML IV SOLN
500.0000 [IU] | INTRAVENOUS | Status: AC | PRN
  Administered 2018-09-02: 500 [IU]

## 2018-09-02 MED ORDER — POTASSIUM CHLORIDE CRYS ER 20 MEQ PO TBCR: 20.0000 meq | EXTENDED_RELEASE_TABLET | Freq: Two times a day (BID) | ORAL | Status: DC

## 2018-09-02 NOTE — Progress Notes (Signed)
Peripherally Inserted Central Catheter/Midline Placement  The IV Nurse has discussed with the patient and/or persons authorized to consent for the patient, the purpose of this procedure and the potential benefits and risks involved with this procedure.  The benefits include less needle sticks, lab draws from the catheter, and the patient may be discharged home with the catheter. Risks include, but not limited to, infection, bleeding, blood clot (thrombus formation), and puncture of an artery; nerve damage and irregular heartbeat and possibility to perform a PICC exchange if needed/ordered by physician.  Alternatives to this procedure were also discussed.  Bard Power PICC patient education guide, fact sheet on infection prevention and patient information card has been provided to patient /or left at bedside.    PICC/Midline Placement Documentation        Pamela Hudson 09/02/2018, 10:31 AM

## 2018-09-02 NOTE — Progress Notes (Signed)
Spoke with Linneus concerning PICC. A double lumen will be placed for CVPs. R PAC to be de accessed. Plan to place this morning.

## 2018-09-02 NOTE — Progress Notes (Signed)
Subjective: 33 year old female with history of sickle cell disease obstructive sleep apnea, morbid obesity, pulmonary hypertension and right-sided heart failure with hemochromatosis admitted with worsening shortness of breath and swelling.  Patient admitted and cardiology consulted.  Patient has had a right heart cath done.  She is currently stable.  No new complaints.  She still has bilateral pedal edema which is worsened.  Denied any fever or chills denied any nausea vomiting or diarrhea.  Patient is not in any sickle cell painful crisis.  Objective: Vital signs in last 24 hours: Temp:  [97.3 F (36.3 C)-99 F (37.2 C)] 98.9 F (37.2 C) (06/20 1932) Pulse Rate:  [105-117] 105 (06/20 1932) Resp:  [20] 20 (06/20 1932) BP: (113-129)/(41-99) 122/63 (06/20 1932) SpO2:  [95 %-100 %] 97 % (06/20 1932) Weight:  [111.1 kg] 111.1 kg (06/20 0500) Weight change: -0.122 kg Last BM Date: 09/01/18  Intake/Output from previous day: 06/19 0701 - 06/20 0700 In: 180 [P.O.:170; I.V.:10] Out: 900 [Urine:900] Intake/Output this shift: No intake/output data recorded.  General appearance: alert, cooperative, appears stated age and no distress Neck: no adenopathy, no carotid bruit, no JVD, supple, symmetrical, trachea midline and thyroid not enlarged, symmetric, no tenderness/mass/nodules Resp: clear to auscultation bilaterally Cardio: regular rate and rhythm, S1, S2 normal, no murmur, click, rub or gallop GI: soft, non-tender; bowel sounds normal; no masses,  no organomegaly Extremities: extremities normal, atraumatic, no cyanosis or edema Pulses: 2+ and symmetric Skin: Skin color, texture, turgor normal. No rashes or lesions Neurologic: Grossly normal  Lab Results: Recent Labs    09/01/18 1835 09/02/18 1331  WBC 18.3* 15.8*  HGB 6.1* 6.0*  HCT 17.2* 17.0*  PLT 256 259   BMET Recent Labs    09/01/18 0924  09/01/18 1700 09/01/18 1835 09/02/18 1331  NA 133*   < > 137  --  132*  K 5.0   <  > 5.6*  --  5.3*  CL 103  --   --   --  102  CO2 20*  --   --   --  21*  GLUCOSE 121*  --   --   --  186*  BUN 67*  --   --   --  63*  CREATININE 1.75*  --   --  1.97* 1.88*  CALCIUM 8.5*  --   --   --  8.9   < > = values in this interval not displayed.    Studies/Results: Dg Chest 2 View  Result Date: 09/01/2018 CLINICAL DATA:  Heart failure, chronic shortness of breath. History of CHF, CVA, cardiomegaly and hypertension. EXAM: CHEST - 2 VIEW COMPARISON:  Chest x-rays dated 08/30/2018, 07/09/2018 and 12/01/2017. FINDINGS: Marked cardiomegaly, stable. Mild central pulmonary vascular congestion without overt alveolar pulmonary edema. No confluent opacity to suggest a developing pneumonia. No pleural effusion or pneumothorax seen. RIGHT chest wall Port-A-Cath appears adequately positioned with tip at the level of the mid SVC. No acute or suspicious osseous finding. IMPRESSION: Mild CHF/volume overload. No overt alveolar pulmonary edema. No evidence of pneumonia. Electronically Signed   By: Franki Cabot M.D.   On: 09/01/2018 12:14   Nm Pulmonary Perfusion  Result Date: 09/01/2018 CLINICAL DATA:  Shortness of breath. EXAM: NUCLEAR MEDICINE PERFUSION LUNG SCAN TECHNIQUE: Perfusion images were obtained in multiple projections after intravenous injection of radiopharmaceutical. Ventilation scans intentionally deferred if perfusion scan and chest x-ray adequate for interpretation during COVID 19 epidemic. RADIOPHARMACEUTICALS:  1.4 mCi Tc-17m MAA IV COMPARISON:  Same day chest  x-ray. FINDINGS: No segmental perfusion defects identified in either lung. IMPRESSION: Normal lung perfusion study. Electronically Signed   By: Kennith CenterEric  Mansell M.D.   On: 09/01/2018 12:16   [place] Abdomen Complete  Result Date: 09/02/2018 CLINICAL DATA:  Possible cirrhosis and ascites, abdominal distension. Previous cholecystectomy. Sickle cell disease. EXAM: ABDOMEN ULTRASOUND COMPLETE COMPARISON:  CT abdomen 12/26/2016 FINDINGS:  Gallbladder: Previous cholecystectomy. Common bile duct: Diameter: 3.3 mm. Liver: Mildly enlarged measuring 21.2 cm in greatest dimension. No focal lesion identified. Within normal limits in parenchymal echogenicity. Portal vein is patent on color Doppler imaging with normal direction of blood flow towards the liver. IVC: No abnormality visualized. Pancreas: Not visualized. Spleen: Not visualized. Right Kidney: Length: 11.9 cm. Echogenicity within normal limits. No mass or hydronephrosis visualized. Left Kidney: Length: 11.4 cm. Echogenicity within normal limits. No mass or hydronephrosis visualized. Abdominal aorta: No aneurysm visualized. Other findings: No ascites. IMPRESSION: No acute findings. Mild hepatomegaly.  No ascites. Previous cholecystectomy. Pancreas and spleen not visualized. Electronically Signed   By: Elberta Fortisaniel  Boyle M.D.   On: 09/02/2018 16:11   [place] Ekg Site Rite  Result Date: 09/02/2018 If Site Rite image not attached, placement could not be confirmed due to current cardiac rhythm.   Medications: I have reviewed the patient's current medications.  Assessment/Plan: 33 year old female admitted with right heart failure.   #1 right heart failure: Appreciate cardiology input.  Patient status post right heart cath yesterday.  Has advanced PAH with right ventricular failure.  PICC line inserted.  IV diuretics and milrinone started.  Watching creatinine closely.  We will continue to follow cardiology recommendations.  #2  Sickle cell disease: Not in sickle cell crisis at the moment.  Continue to monitor.  PRN pain medications.  #3 hypertension: Blood pressure is controlled at this point.  Continue to monitor.  #4 acute on chronic kidney disease: BUN/creatinine slightly elevated.  Continue to monitor patient's renal function closely.  Especially in the setting of diuresis.  #5 obstructive sleep apnea: CPAP at night.  #6 hypothyroidism: Continue levothyroxine.  LOS: 2 days    Pamela Hudson,LAWAL 09/02/2018, 8:34 PM

## 2018-09-02 NOTE — Consult Note (Addendum)
Advanced Heart Failure Team Consult Note   Primary Physician: Judd LienWebb, Meghan H, PA-C PCP-Cardiologist:  No primary care provider on file.  Referring: Dr. Jacinto HalimGanji  Reason for Consultation: RV failure  HPI:    Pamela Hudson  is a 33 y.o. female with morbid obesity, sickle cell disease with related hemochromatosis, severe obstructive sleep apnea by sleep study on 08/14/2018, hypertension and diastolic/RV failure admitted on 6/18 with worsening dyspnea and volume overload. The AHF team has been asked to assist with management by Dr. Jacinto HalimGanji.   She was recently admitted in April with a similar presentation with a weight gain from 173 to 223 over 2 months. She was diuresed and discharge weight was 210 pounds. She subsequently followed up with Pulmonary as well as with Dr. Dot [facility]beck [facility] in [place].   She was volume overloaded at that visit with weight at 220. Lasix switched to torsemide  Echo 07/07/18 Showed LVEF 65-70% with mild LVH. Septum flat. Severe TR with moderate to severe PAH . Hgb 6.4 at the time RHC discussed but not felt necessary.   Also saw Pulmory and was found to have restrictive lung disease with ratio 88% FEV1 60% FVC 58% DLCO 60%. As well as severe OSA with AHI 61/ CT chest was unremarkable except for enlarged pulmonary arteries   She presented back to [facility] [place] on 6/18 with worsening volume overload. Denied missing medicines but does not appear complaint with dietary restrictions. Weight on admission was 245 pounds. Creatinine up 1.12 (07/12/18) - > 1.58 08/30/18.  She did not respond well to IV diuretics and creatinine rose to 1.75 so Cardiology was consulted. She was seen by Dr. Jacinto HalimGanji and taken for RHC.   RHC on 09/01/18 RA 38 (with large v-waves) RV 50/31 PA 55/26 (39) PCWP 24 Ao 93% PA sat 66% RA sat 68% Fick 12.9/6.4 PVR = 1.16 WU PAPi = 0.76  We discussed the case and felt she had a mixed picture with likely advanced PAH with resultant RV failure. She was started on  milrinone for RV support and IV diuretics restarted at lasix 80 IV bid.   PICC placed this am. Only modes response to IV lasix. Creatinine up to 1.97.  Hgb this am 7.8 -> 6.1. K 5.7    Review of Systems: [y] = yes, [ ]  = no   . General: Weight gain Cove.Etienne[y ]; Weight loss [ ] ; Anorexia [ ] ; Fatigue Cove.Etienne[y ]; Fever [ ] ; Chills [ ] ; Weakness Cove.Etienne[y ]  . Cardiac: Chest pain/pressure [ ] ; Resting SOB [ y]; Exertional SOB [ y]; Orthopnea [ ] ; Pedal Edema [ y]; Palpitations [ ] ; Syncope [ ] ; Presyncope [ ] ; Paroxysmal nocturnal dyspnea[ ]   . Pulmonary: Cough Cove.Etienne[y ]; Wheezing[ ] ; Hemoptysis[ ] ; Sputum [ ] ; Snoring Cove.Etienne[y ]  . GI: Vomiting[ ] ; Dysphagia[ ] ; Melena[ ] ; Hematochezia [ ] ; Heartburn[ ] ; Abdominal pain [ ] ; Constipation [ ] ; Diarrhea [ ] ; BRBPR [ ]   . GU: Hematuria[ ] ; Dysuria [ ] ; Nocturia[ ]   . Vascular: Pain in legs with walking [ ] ; Pain in feet with lying flat [ ] ; Non-healing sores [ ] ; Stroke [ ] ; TIA [ ] ; Slurred speech [ ] ;  . Neuro: Headaches[ ] ; Vertigo[ ] ; Seizures[ ] ; Paresthesias[ ] ;Blurred vision [ ] ; Diplopia [ ] ; Vision changes [ ]   . Ortho/Skin: Arthritis Cove.Etienne[y ]; Joint pain [ y]; Muscle pain [ ] ; Joint swelling [ ] ; Back Pain [ ] ; Rash [ ]   . Psych: Depression[ ] ;  Anxiety[ ]   . Heme: Bleeding problems [ ] ; Clotting disorders [ ] ; Anemia Blue.Reese ]  . Endocrine: Diabetes [ ] ; Thyroid dysfunction[ ]   Home Medications Prior to Admission medications   Medication Sig Start Date End Date Taking? Authorizing Provider  Deferasirox (JADENU) 360 MG TABS Take 1,080 mg by mouth daily.    Yes [provider]  Etonogestrel (IMPLANON [place]) Inject 1 each into the skin once. Implanted spring 2019   Yes [provider]  folic acid (FOLVITE) 1 MG tablet Take 1 mg by mouth daily. 11/21/15  Yes [provider]  hydroxyurea (HYDREA) 500 MG capsule Take 1,000 mg by mouth daily.    Yes [provider]  magnesium oxide (MAG-OX) 400 MG tablet Take 400 mg by mouth daily. 01/26/18  Yes  [provider]  metoprolol succinate (TOPROL XL) 25 MG 24 hr tablet Take 0.5 tablets (12.5 mg total) by mouth daily. 11/29/15  Yes Tresa Garter, MD  torsemide (DEMADEX) 20 MG tablet Take 40 mg by mouth daily. 07/28/18  Yes [provider]  Vitamin D, Ergocalciferol, (DRISDOL) 1.25 MG (50000 UT) CAPS capsule Take 100,000 Units by mouth every Sunday. 2 capsules 01/26/18  Yes [provider]  furosemide (LASIX) 20 MG tablet Take 2 tablets (40 mg total) by mouth 2 (two) times daily. Patient not taking: Reported on 08/30/2018 07/12/18   Dorena Dew, FNP    Past Medical History: Past Medical History:  Diagnosis Date  . Cardiomegaly   . CHF (congestive heart failure) ([place])   . Chronic pain   . CVA (cerebral infarction) 02/2002  . Hypertension   . Liver disease    ?iron  . PCOS (polycystic ovarian syndrome)   . Renal stone   . Sickle cell anemia ([facility])     Past Surgical History: Past Surgical History:  Procedure Laterality Date  . CHOLECYSTECTOMY    . LIVER BIOPSY  2007  . PARATHYROIDECTOMY    . PORTACATH PLACEMENT      Family History: Family History  Problem Relation Age of Onset  . Diabetes Mellitus II Father   . CAD Father   . Diabetes Mellitus II Paternal Grandmother     Social History: Social History   Socioeconomic History  . Marital status: Single    Spouse name: Not on file  . Number of children: Not on file  . Years of education: Not on file  . Highest education level: Not on file  Occupational History  . Not on file  Social Needs  . Financial resource strain: Not on file  . Food insecurity    Worry: Not on file    Inability: Not on file  . Transportation needs    Medical: Not on file    Non-medical: Not on file  Tobacco Use  . Smoking status: Former Research scientist (life sciences)  . Smokeless tobacco: Never Used  Substance and Sexual Activity  . Alcohol use: Yes    Comment: occ  . Drug use: No  . Sexual activity: Yes    Birth  control/protection: Implant  Lifestyle  . Physical activity    Days per week: Not on file    Minutes per session: Not on file  . Stress: Not on file  Relationships  . Social Herbalist on phone: Not on file    Gets together: Not on file    Attends religious service: Not on file    Active member of club or organization: Not on file  Attends meetings of clubs or organizations: Not on file    Relationship status: Not on file  Other Topics Concern  . Not on file  Social History Narrative  . Not on file    Allergies:  Allergies  Allergen Reactions  . Percocet [Oxycodone-Acetaminophen] Swelling    Lip swelling    Objective:    Vital Signs:   Temp:  [97.3 F (36.3 C)-99 F (37.2 C)] 99 F (37.2 C) (06/20 0625) Pulse Rate:  [88-117] 110 (06/20 0352) Resp:  [17-26] 20 (06/19 2302) BP: (110-135)/(49-99) 122/49 (06/20 0352) SpO2:  [94 %-100 %] 98 % (06/20 0352) Weight:  [111.1 kg] 111.1 kg (06/20 0500) Last BM Date: 09/01/18  Weight change: Filed Weights   08/30/18 1657 09/01/18 0635 09/02/18 0500  Weight: 110.7 kg 111.2 kg 111.1 kg    Intake/Output:   Intake/Output Summary (Last 24 hours) at 09/02/2018 0832 Last data filed at 09/02/2018 0650 Gross per 24 hour  Intake 180 ml  Output 900 ml  Net -720 ml      Physical Exam    General:  Obese woman lying in bed. No resp difficulty HEENT: normal Neck: supple. JVP to ear . Carotids 2+ bilat; no bruits. No lymphadenopathy or thyromegaly appreciated. Cor: PMI nondisplaced. Tachy regular. RV livt. 2/6 TR Lungs: clear Abdomen: obese soft, nontender, + distended. + hepatomegaly. No bruits or masses. Good bowel sounds. Extremities: no cyanosis, clubbing, rash, 4+ edema diffusely into upper thighs  Neuro: alert & orientedx3, cranial nerves grossly intact. moves all 4 extremities w/o difficulty. Affect pleasant   Telemetry   Sinus tach 110-120 Personally reviewed   EKG    Sinus tach 105. Low volts RAD  nonspecific T wave flattening. Personally reviewed   Labs   Basic Metabolic Panel: Recent Labs  Lab 08/30/18 1737 08/31/18 0320 09/01/18 0924 09/01/18 1654 09/01/18 1659 09/01/18 1700 09/01/18 1835  NA 132* 133* 133* 137 138 137  --   K 4.7 5.0 5.0 5.7* 5.6* 5.6*  --   CL 104 104 103  --   --   --   --   CO2 20* 21* 20*  --   --   --   --   GLUCOSE 140* 136* 121*  --   --   --   --   BUN 59* 60* 67*  --   --   --   --   CREATININE 1.58* 1.62* 1.75*  --   --   --  1.97*  CALCIUM 8.7* 8.7* 8.5*  --   --   --   --   MG  --  2.2  --   --   --   --   --   PHOS  --  4.9*  --   --   --   --   --     Liver Function Tests: Recent Labs  Lab 08/30/18 1737 08/31/18 0320  AST 103* 98*  ALT 84* 84*  ALKPHOS 107 96  BILITOT 8.8* 8.0*  PROT 10.1* 9.9*  ALBUMIN 3.1* 3.0*   No results for input(s): LIPASE, AMYLASE in the last 168 hours. No results for input(s): AMMONIA in the last 168 hours.  CBC: Recent Labs  Lab 08/30/18 1737 08/31/18 0320 09/01/18 0924 09/01/18 1654 09/01/18 1659 09/01/18 1700 09/01/18 1835  WBC 19.2* 18.5* 16.8*  --   --   --  18.3*  NEUTROABS 11.3*  --  10.2*  --   --   --   --  HGB 5.8* 5.9* 6.1* 8.2* 7.8* 7.8* 6.1*  HCT 16.9* 16.6* 17.5* 24.0* 23.0* 23.0* 17.2*  MCV 116.6* 116.9* 115.9*  --   --   --  113.2*  PLT 250 255 247  --   --   --  256    Cardiac Enzymes: Recent Labs  Lab 08/30/18 1737 08/31/18 0320 08/31/18 0801 08/31/18 1312  TROPONINI 0.04* 0.03* 0.03* 0.03*    BNP: BNP (last 3 results) Recent Labs    07/09/18 1409 08/30/18 1737 09/01/18 1835  BNP 449.3* 571.0* 405.1*    ProBNP (last 3 results) No results for input(s): PROBNP in the last 8760 hours.   CBG: No results for input(s): GLUCAP in the last 168 hours.  Coagulation Studies: No results for input(s): LABPROT, INR in the last 72 hours.   Imaging   Dg Chest 2 View  Result Date: 09/01/2018 CLINICAL DATA:  Heart failure, chronic shortness of breath.  History of CHF, CVA, cardiomegaly and hypertension. EXAM: CHEST - 2 VIEW COMPARISON:  Chest x-rays dated 08/30/2018, 07/09/2018 and 12/01/2017. FINDINGS: Marked cardiomegaly, stable. Mild central pulmonary vascular congestion without overt alveolar pulmonary edema. No confluent opacity to suggest a developing pneumonia. No pleural effusion or pneumothorax seen. RIGHT chest wall Port-A-Cath appears adequately positioned with tip at the level of the mid SVC. No acute or suspicious osseous finding. IMPRESSION: Mild CHF/volume overload. No overt alveolar pulmonary edema. No evidence of pneumonia. Electronically Signed   By: Bary RichardStan  Maynard M.D.   On: 09/01/2018 12:14   Nm Pulmonary Perfusion  Result Date: 09/01/2018 CLINICAL DATA:  Shortness of breath. EXAM: NUCLEAR MEDICINE PERFUSION LUNG SCAN TECHNIQUE: Perfusion images were obtained in multiple projections after intravenous injection of radiopharmaceutical. Ventilation scans intentionally deferred if perfusion scan and chest x-ray adequate for interpretation during COVID 19 epidemic. RADIOPHARMACEUTICALS:  1.4 mCi Tc-3563m MAA IV COMPARISON:  Same day chest x-ray. FINDINGS: No segmental perfusion defects identified in either lung. IMPRESSION: Normal lung perfusion study. Electronically Signed   By: Kennith CenterEric  Mansell M.D.   On: 09/01/2018 12:16   [place] Ekg Site Rite  Result Date: 09/02/2018 If Site Rite image not attached, placement could not be confirmed due to current cardiac rhythm.     Medications:     Current Medications: . enoxaparin (LOVENOX) injection  30 mg Subcutaneous QHS  . folic acid  1 mg Oral Daily  . furosemide  80 mg Intravenous Q12H  . magnesium oxide  400 mg Oral Daily  . metoprolol succinate  12.5 mg Oral Daily  . potassium chloride  20 mEq Oral BID  . sodium chloride flush  3 mL Intravenous Q12H     Infusions: . milrinone 0.25 mcg/kg/min (09/02/18 0609)       Assessment/Plan   1. Acute on chronic diastolic HF/RV failure -  very difficult case. - I have reviewed previous notes, echo and RHC - She has moderate PAH with markedly elevated RA pressures in setting of severe TR. My overall impression is that her major problem here is RV failure (PaPI < 1.0) in setting of longstanding PAH but her RV function looks ok on echo and Fick cardiac output very high on cath so this may be confounded by a component of high-output failure.  - May also have possible peripheral shunting falsely elevating Fick so will get echo with bubble - will stop milrinone for now.  - switch to lasix gtt at 20 and add metolazone - UNNA boots - will d/w primary team regarding possible transfusion to  help alleviate some high output physiology  2. 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 AKI - suspect due to renal congestion. hoepfully will improve with decongestion  4. Hyperkalemia - due to AKI and potassium supplementation +/- hemolysis - stop KCl - can give Lokelma as needed  5. Sickle cell anemia - per priamry team. No evidence of crisis currently  6. Morbid obesity - needs weight loss  7. Severe OSA - continue CPAP  Length of Stay: 2  Arvilla Meresaniel Bensimhon, MD  09/02/2018, 8:32 AM  Advanced Heart Failure Team Pager [phone] (M-F; 7a - 4p)  Please contact [facility] for night-coverage after hours (4p -7a ) and weekends on [email or web address]

## 2018-09-02 NOTE — Progress Notes (Signed)
Orthopedic Tech Progress Note Patient Details:  Pamela Hudson 09/17/1985 [id number]  Ortho Devices Type of Ortho Device: Haematologist Ortho Device/Splint Location: bilateral Ortho Device/Splint Interventions: Adjustment, Application, Ordered   Post Interventions Patient Tolerated: Well Instructions Provided: Care of device, Adjustment of device   Janit Pagan 09/02/2018, 2:51 PM

## 2018-09-03 LAB — BASIC METABOLIC PANEL
Anion gap: 9 (ref 5–15)
BUN: 64 mg/dL — ABNORMAL HIGH (ref 6–20)
CO2: 21 mmol/L — ABNORMAL LOW (ref 22–32)
Calcium: 8.9 mg/dL (ref 8.9–10.3)
Chloride: 101 mmol/L (ref 98–111)
Creatinine, Ser: 1.97 mg/dL — ABNORMAL HIGH (ref 0.44–1.00)
GFR calc Af Amer: 38 mL/min — ABNORMAL LOW (ref >60)
GFR calc non Af Amer: 33 mL/min — ABNORMAL LOW (ref >60)
Glucose, Bld: 182 mg/dL — ABNORMAL HIGH (ref 70–99)
Potassium: 5.3 mmol/L — ABNORMAL HIGH (ref 3.5–5.1)
Sodium: 131 mmol/L — ABNORMAL LOW (ref 135–145)

## 2018-09-03 LAB — PREPARE RBC (CROSSMATCH)

## 2018-09-03 LAB — BRAIN NATRIURETIC PEPTIDE: B Natriuretic Peptide: 467 pg/mL — ABNORMAL HIGH (ref 0.0–100.0)

## 2018-09-03 LAB — COOXEMETRY PANEL
Carboxyhemoglobin: 3.8 % — ABNORMAL HIGH (ref 0.5–1.5)
Methemoglobin: 1.3 % (ref 0.0–1.5)
O2 Saturation: 66.9 %
Total hemoglobin: 6.4 g/dL — CL (ref 12.0–16.0)

## 2018-09-03 MED ORDER — SODIUM CHLORIDE 0.9% IV SOLUTION: Freq: Once | INTRAVENOUS | Status: DC

## 2018-09-03 MED ORDER — ACETAZOLAMIDE 250 MG PO TABS
250.0000 mg | ORAL_TABLET | Freq: Two times a day (BID) | ORAL | Status: DC
  Administered 2018-09-03 – 2018-09-08 (×11): 250 mg via ORAL
  Filled 2018-09-03 (×13): qty 1

## 2018-09-03 NOTE — Progress Notes (Signed)
Subjective: Patient is doing much better.  She has no new complaint.  She is breathing better but hemoglobin has significantly dropped to 6.4.  Currently on Lasix and milrinone.  Followed by cardiology.  PICC line in place.  No pain except around her PICC line..  Objective: Vital signs in last 24 hours: Temp:  [98.1 F (36.7 C)-98.9 F (37.2 C)] 98.3 F (36.8 C) (06/21 0543) Pulse Rate:  [100-111] 100 (06/21 0543) Resp:  [20] 20 (06/20 1932) BP: (111-122)/(41-67) 111/67 (06/21 0543) SpO2:  [96 %-98 %] 96 % (06/21 0543) Weight:  [108.6 kg] 108.6 kg (06/21 0515) Weight change: -2.5 kg Last BM Date: 09/03/18  Intake/Output from previous day: 06/20 0701 - 06/21 0700 In: 856.3 [P.O.:720; I.V.:136.3] Out: 4100 [Urine:4100] Intake/Output this shift: Total I/O In: 10 [I.V.:10] Out: -   General appearance: alert, cooperative, appears stated age and no distress Neck: no adenopathy, no carotid bruit, no JVD, supple, symmetrical, trachea midline and thyroid not enlarged, symmetric, no tenderness/mass/nodules Resp: clear to auscultation bilaterally Cardio: regular rate and rhythm, S1, S2 normal, no murmur, click, rub or gallop GI: soft, non-tender; bowel sounds normal; no masses,  no organomegaly Extremities: extremities normal, atraumatic, no cyanosis or edema Pulses: 2+ and symmetric Skin: Skin color, texture, turgor normal. No rashes or lesions Neurologic: Grossly normal  Lab Results: Recent Labs    09/01/18 1835 09/02/18 1331  WBC 18.3* 15.8*  HGB 6.1* 6.0*  HCT 17.2* 17.0*  PLT 256 259   BMET Recent Labs    09/02/18 1331 09/03/18 0459  NA 132* 131*  K 5.3* 5.3*  CL 102 101  CO2 21* 21*  GLUCOSE 186* 182*  BUN 63* 64*  CREATININE 1.88* 1.97*  CALCIUM 8.9 8.9    Studies/Results: [place] Abdomen Complete  Result Date: 09/02/2018 CLINICAL DATA:  Possible cirrhosis and ascites, abdominal distension. Previous cholecystectomy. Sickle cell disease. EXAM: ABDOMEN ULTRASOUND  COMPLETE COMPARISON:  CT abdomen 12/26/2016 FINDINGS: Gallbladder: Previous cholecystectomy. Common bile duct: Diameter: 3.3 mm. Liver: Mildly enlarged measuring 21.2 cm in greatest dimension. No focal lesion identified. Within normal limits in parenchymal echogenicity. Portal vein is patent on color Doppler imaging with normal direction of blood flow towards the liver. IVC: No abnormality visualized. Pancreas: Not visualized. Spleen: Not visualized. Right Kidney: Length: 11.9 cm. Echogenicity within normal limits. No mass or hydronephrosis visualized. Left Kidney: Length: 11.4 cm. Echogenicity within normal limits. No mass or hydronephrosis visualized. Abdominal aorta: No aneurysm visualized. Other findings: No ascites. IMPRESSION: No acute findings. Mild hepatomegaly.  No ascites. Previous cholecystectomy. Pancreas and spleen not visualized. Electronically Signed   By: Elberta Fortisaniel  Boyle M.D.   On: 09/02/2018 16:11   [place] Ekg Site Rite  Result Date: 09/02/2018 If Site Rite image not attached, placement could not be confirmed due to current cardiac rhythm.   Medications: I have reviewed the patient's current medications.  Assessment/Plan: 33 year old female admitted with right heart failure.   #1 right heart failure: Continue with diuresis as well as milrinone.  Patient's hemoglobin has dropped to 6.4.  We will transfuse 2 units of packed red blood cells to help with oxygen carrying capacity and possibly decrease stress on her heart.  Continue per cardiology  #2  Sickle cell disease: Not in sickle cell crisis at the moment.  Hemoglobin however has dropped.  We will transfuse mainly for the reason of heart failure.  Continue to monitor.  PRN pain medications.  #3 hypertension: Blood pressure is controlled at this point.  Continue to  monitor.  #4 acute on chronic kidney disease: BUN/creatinine slightly elevated.  Continue to monitor patient's renal function closely.  Especially in the setting of  diuresis.  #5 obstructive sleep apnea: CPAP at night.  #6 hypothyroidism: Continue levothyroxine.  #7 hyperkalemia: Mild hyperkalemia.  Continue with Lasix which hopefully will help drop potassium.  If it remains persistent we will try Kayexalate.   LOS: 3 days   Huda Petrey,LAWAL 09/03/2018, 12:22 PM

## 2018-09-03 NOTE — Progress Notes (Signed)
Patient states she will place self on CPAP when ready. RT informed patient if she has any trouble have RN contact RT.

## 2018-09-03 NOTE — Progress Notes (Signed)
Advanced Heart Failure Rounding Note   Subjective:    On lasix 20/hr and metolazone 5 bid. Milrinone off.   Diuresing very well. Weight down 5 pound. Says breathing is better and she feels less tight.   Weight down 5 pounds. No orthopnea or PND. Co-ox 67% K stable at 5.3. Creatinine uo to 1.97   Objective:   Weight Range:  Vital Signs:   Temp:  [98.1 F (36.7 C)-98.9 F (37.2 C)] 98.3 F (36.8 C) (06/21 0543) Pulse Rate:  [100-111] 100 (06/21 0543) Resp:  [20] 20 (06/20 1932) BP: (111-122)/(41-67) 111/67 (06/21 0543) SpO2:  [96 %-98 %] 96 % (06/21 0543) Weight:  [108.6 kg] 108.6 kg (06/21 0515) Last BM Date: 09/01/18  Weight change: Filed Weights   09/01/18 0635 09/02/18 0500 09/03/18 0515  Weight: 111.2 kg 111.1 kg 108.6 kg    Intake/Output:   Intake/Output Summary (Last 24 hours) at 09/03/2018 0924 Last data filed at 09/03/2018 0655 Gross per 24 hour  Intake 856.25 ml  Output 4100 ml  Net -3243.75 ml     Physical Exam: General:  Sitting up in side of bed HEENT: normal Neck: supple. JVP to ear. Carotids 2+ bilat; no bruits. No lymphadenopathy or thryomegaly appreciated. Cor: PMI nondisplaced. Tachy regular. +RV lift. +2/6 TR Lungs: clear Abdomen: obese soft, nontender, + distended. No hepatosplenomegaly. No bruits or masses. Good bowel sounds. Extremities: no cyanosis, clubbing, rash, 3-4+ diffiuse edema Neuro: alert & orientedx3, cranial nerves grossly intact. moves all 4 extremities w/o difficulty. Affect pleasant   Telemetry: Sinus tach 100-105 Personally reviewed   Labs: Basic Metabolic Panel: Recent Labs  Lab 08/30/18 1737 08/31/18 0320 09/01/18 0924 09/01/18 1654 09/01/18 1659 09/01/18 1700 09/01/18 1835 09/02/18 1331 09/03/18 0459  NA 132* 133* 133* 137 138 137  --  132* 131*  K 4.7 5.0 5.0 5.7* 5.6* 5.6*  --  5.3* 5.3*  CL 104 104 103  --   --   --   --  102 101  CO2 20* 21* 20*  --   --   --   --  21* 21*  GLUCOSE 140* 136* 121*   --   --   --   --  186* 182*  BUN 59* 60* 67*  --   --   --   --  63* 64*  CREATININE 1.58* 1.62* 1.75*  --   --   --  1.97* 1.88* 1.97*  CALCIUM 8.7* 8.7* 8.5*  --   --   --   --  8.9 8.9  MG  --  2.2  --   --   --   --   --   --   --   PHOS  --  4.9*  --   --   --   --   --   --   --     Liver Function Tests: Recent Labs  Lab 08/30/18 1737 08/31/18 0320  AST 103* 98*  ALT 84* 84*  ALKPHOS 107 96  BILITOT 8.8* 8.0*  PROT 10.1* 9.9*  ALBUMIN 3.1* 3.0*   No results for input(s): LIPASE, AMYLASE in the last 168 hours. No results for input(s): AMMONIA in the last 168 hours.  CBC: Recent Labs  Lab 08/30/18 1737 08/31/18 0320 09/01/18 0924 09/01/18 1654 09/01/18 1659 09/01/18 1700 09/01/18 1835 09/02/18 1331  WBC 19.2* 18.5* 16.8*  --   --   --  18.3* 15.8*  NEUTROABS 11.3*  --  10.2*  --   --   --   --   --  HGB 5.8* 5.9* 6.1* 8.2* 7.8* 7.8* 6.1* 6.0*  HCT 16.9* 16.6* 17.5* 24.0* 23.0* 23.0* 17.2* 17.0*  MCV 116.6* 116.9* 115.9*  --   --   --  113.2* 112.6*  PLT 250 255 247  --   --   --  256 259    Cardiac Enzymes: Recent Labs  Lab 08/30/18 1737 08/31/18 0320 08/31/18 0801 08/31/18 1312  TROPONINI 0.04* 0.03* 0.03* 0.03*    BNP: BNP (last 3 results) Recent Labs    09/01/18 1835 09/02/18 1331 09/03/18 0459  BNP 405.1* 444.9* 467.0*    ProBNP (last 3 results) No results for input(s): PROBNP in the last 8760 hours.    Other results:  Imaging: Dg Chest 2 View  Result Date: 09/01/2018 CLINICAL DATA:  Heart failure, chronic shortness of breath. History of CHF, CVA, cardiomegaly and hypertension. EXAM: CHEST - 2 VIEW COMPARISON:  Chest x-rays dated 08/30/2018, 07/09/2018 and 12/01/2017. FINDINGS: Marked cardiomegaly, stable. Mild central pulmonary vascular congestion without overt alveolar pulmonary edema. No confluent opacity to suggest a developing pneumonia. No pleural effusion or pneumothorax seen. RIGHT chest wall Port-A-Cath appears adequately  positioned with tip at the level of the mid SVC. No acute or suspicious osseous finding. IMPRESSION: Mild CHF/volume overload. No overt alveolar pulmonary edema. No evidence of pneumonia. Electronically Signed   By: Bary RichardStan  Maynard M.D.   On: 09/01/2018 12:14   Nm Pulmonary Perfusion  Result Date: 09/01/2018 CLINICAL DATA:  Shortness of breath. EXAM: NUCLEAR MEDICINE PERFUSION LUNG SCAN TECHNIQUE: Perfusion images were obtained in multiple projections after intravenous injection of radiopharmaceutical. Ventilation scans intentionally deferred if perfusion scan and chest x-ray adequate for interpretation during COVID 19 epidemic. RADIOPHARMACEUTICALS:  1.4 mCi Tc-112m MAA IV COMPARISON:  Same day chest x-ray. FINDINGS: No segmental perfusion defects identified in either lung. IMPRESSION: Normal lung perfusion study. Electronically Signed   By: Kennith CenterEric  Mansell M.D.   On: 09/01/2018 12:16   [place] Abdomen Complete  Result Date: 09/02/2018 CLINICAL DATA:  Possible cirrhosis and ascites, abdominal distension. Previous cholecystectomy. Sickle cell disease. EXAM: ABDOMEN ULTRASOUND COMPLETE COMPARISON:  CT abdomen 12/26/2016 FINDINGS: Gallbladder: Previous cholecystectomy. Common bile duct: Diameter: 3.3 mm. Liver: Mildly enlarged measuring 21.2 cm in greatest dimension. No focal lesion identified. Within normal limits in parenchymal echogenicity. Portal vein is patent on color Doppler imaging with normal direction of blood flow towards the liver. IVC: No abnormality visualized. Pancreas: Not visualized. Spleen: Not visualized. Right Kidney: Length: 11.9 cm. Echogenicity within normal limits. No mass or hydronephrosis visualized. Left Kidney: Length: 11.4 cm. Echogenicity within normal limits. No mass or hydronephrosis visualized. Abdominal aorta: No aneurysm visualized. Other findings: No ascites. IMPRESSION: No acute findings. Mild hepatomegaly.  No ascites. Previous cholecystectomy. Pancreas and spleen not visualized.  Electronically Signed   By: Elberta Fortisaniel  Boyle M.D.   On: 09/02/2018 16:11   [place] Ekg Site Rite  Result Date: 09/02/2018 If Site Rite image not attached, placement could not be confirmed due to current cardiac rhythm.     Medications:     Scheduled Medications: . enoxaparin (LOVENOX) injection  30 mg Subcutaneous QHS  . folic acid  1 mg Oral Daily  . magnesium oxide  400 mg Oral Daily  . metolazone  5 mg Oral BID  . sodium chloride flush  3 mL Intravenous Q12H     Infusions: . furosemide (LASIX) infusion 20 mg/hr (09/03/18 0009)     PRN Medications:  ondansetron **OR** ondansetron (ZOFRAN) IV, sodium chloride flush   Assessment/Plan:  1. 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  - My issue here is RV failure (PaPI < 1.0) in setting of longstanding PAH but her RV function looks ok on echo and Fick cardiac output very high on cath so may also have a component of high-output failure.  - May also have possible peripheral shunting falsely elevating Fick so will get echo with bubble - continue lasix gtt at 20 and metolazone. May have to cut back a bit if creatinine continues to rise - add diamox 250 bid - Continue UNNA boots - will d/w primary team regarding possible transfusion to help alleviate some high output physiology  2. 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 AKI - suspect due to renal congestion. hoepfully will improve with decongestion  4. Hyperkalemia - due to AKI and potassium supplementation +/- hemolysis - KCL stopped - can give Lokelma as needed  5. Sickle cell anemia - per priamry team. No evidence of crisis currently  6. Morbid obesity - needs weight loss  7. Severe OSA - continue CPAP   Length of Stay: 3   Glori Bickers MD 09/03/2018, 9:24 AM  Advanced Heart Failure Team Pager [phone] (M-F; 7a - 4p)  Please contact [facility] for night-coverage after hours (4p -7a ) and weekends on [email or web address]

## 2018-09-03 NOTE — Progress Notes (Signed)
RT removed patient's glasses and placed them on bedside table. RT applied CPAP to patient. Patient is tolerating well at this time. RT will monitor as needed.

## 2018-09-04 ENCOUNTER — Encounter (HOSPITAL_COMMUNITY): Payer: Self-pay | Admitting: Cardiology

## 2018-09-04 LAB — BASIC METABOLIC PANEL
Anion gap: 9 (ref 5–15)
BUN: 60 mg/dL — ABNORMAL HIGH (ref 6–20)
CO2: 24 mmol/L (ref 22–32)
Calcium: 9 mg/dL (ref 8.9–10.3)
Chloride: 98 mmol/L (ref 98–111)
Creatinine, Ser: 1.64 mg/dL — ABNORMAL HIGH (ref 0.44–1.00)
GFR calc Af Amer: 48 mL/min — ABNORMAL LOW (ref >60)
GFR calc non Af Amer: 41 mL/min — ABNORMAL LOW (ref >60)
Glucose, Bld: 209 mg/dL — ABNORMAL HIGH (ref 70–99)
Potassium: 4.6 mmol/L (ref 3.5–5.1)
Sodium: 131 mmol/L — ABNORMAL LOW (ref 135–145)

## 2018-09-04 LAB — CBC
HCT: 23.5 % — ABNORMAL LOW (ref 36.0–46.0)
Hemoglobin: 8.1 g/dL — ABNORMAL LOW (ref 12.0–15.0)
MCH: 36.2 pg — ABNORMAL HIGH (ref 26.0–34.0)
MCHC: 34.5 g/dL (ref 30.0–36.0)
MCV: 104.9 fL — ABNORMAL HIGH (ref 80.0–100.0)
Platelets: 233 10*3/uL (ref 150–400)
RBC: 2.24 MIL/uL — ABNORMAL LOW (ref 3.87–5.11)
RDW: 24.4 % — ABNORMAL HIGH (ref 11.5–15.5)
WBC: 16.5 10*3/uL — ABNORMAL HIGH (ref 4.0–10.5)
nRBC: 5.8 % — ABNORMAL HIGH (ref 0.0–0.2)

## 2018-09-04 LAB — COOXEMETRY PANEL
Carboxyhemoglobin: 4.1 % — ABNORMAL HIGH (ref 0.5–1.5)
Methemoglobin: 1.3 % (ref 0.0–1.5)
O2 Saturation: 75.5 %
Total hemoglobin: 8.4 g/dL — ABNORMAL LOW (ref 12.0–16.0)

## 2018-09-04 LAB — BRAIN NATRIURETIC PEPTIDE: B Natriuretic Peptide: 492.7 pg/mL — ABNORMAL HIGH (ref 0.0–100.0)

## 2018-09-04 MED ORDER — ENOXAPARIN SODIUM 40 MG/0.4ML ~~LOC~~ SOLN
40.0000 mg | Freq: Every day | SUBCUTANEOUS | Status: DC
  Administered 2018-09-04 – 2018-09-07 (×4): 40 mg via SUBCUTANEOUS
  Filled 2018-09-04 (×4): qty 0.4

## 2018-09-04 NOTE — Progress Notes (Signed)
Advanced Heart Failure Rounding Note   Subjective:    Remains on lasix 20/hr and metolazone 5 bid. Down another 8 pounds. Also got 2 units RBCs overnight  Breathing better. Less bloated. Edema improving. Creatinine down to 1.6    Objective:   Weight Range:  Vital Signs:   Temp:  [98.2 F (36.8 C)-99.1 F (37.3 C)] 99.1 F (37.3 C) (06/22 0814) Pulse Rate:  [99-109] 99 (06/22 0814) Resp:  [16-20] 18 (06/22 0814) BP: (108-134)/(52-71) 108/54 (06/22 0814) SpO2:  [84 %-100 %] 84 % (06/22 0814) Weight:  [105.1 kg] 105.1 kg (06/22 0444) Last BM Date: 09/02/18  Weight change: Filed Weights   09/02/18 0500 09/03/18 0515 09/04/18 0444  Weight: 111.1 kg 108.6 kg 105.1 kg    Intake/Output:   Intake/Output Summary (Last 24 hours) at 09/04/2018 0834 Last data filed at 09/04/2018 0817 Gross per 24 hour  Intake 2228.66 ml  Output 7350 ml  Net -5121.34 ml     Physical Exam: General:  Sitting up on side of bed. No resp difficulty HEENT: normal Neck: supple. JVP to ear Carotids 2+ bilat; no bruits. No lymphadenopathy or thryomegaly appreciated. Cor: PMI nondisplaced. Tachy regular + 2/6 TR  + RV lift Lungs: clear Abdomen: obese soft, nontender, nondistended. No hepatosplenomegaly. No bruits or masses. Good bowel sounds. Extremities: no cyanosis, clubbing, rash, 3+ diffuse edema Neuro: alert & orientedx3, cranial nerves grossly intact. moves all 4 extremities w/o difficulty. Affect pleasant   Telemetry: Sinus tach 95-105 Personally reviewed   Labs: Basic Metabolic Panel: Recent Labs  Lab 08/31/18 0320 09/01/18 0924  09/01/18 1659 09/01/18 1700 09/01/18 1835 09/02/18 1331 09/03/18 0459 09/04/18 0623  NA 133* 133*   < > 138 137  --  132* 131* 131*  K 5.0 5.0   < > 5.6* 5.6*  --  5.3* 5.3* 4.6  CL 104 103  --   --   --   --  102 101 98  CO2 21* 20*  --   --   --   --  21* 21* 24  GLUCOSE 136* 121*  --   --   --   --  186* 182* 209*  BUN 60* 67*  --   --   --   --   63* 64* 60*  CREATININE 1.62* 1.75*  --   --   --  1.97* 1.88* 1.97* 1.64*  CALCIUM 8.7* 8.5*  --   --   --   --  8.9 8.9 9.0  MG 2.2  --   --   --   --   --   --   --   --   PHOS 4.9*  --   --   --   --   --   --   --   --    < > = values in this interval not displayed.    Liver Function Tests: Recent Labs  Lab 08/30/18 1737 08/31/18 0320  AST 103* 98*  ALT 84* 84*  ALKPHOS 107 96  BILITOT 8.8* 8.0*  PROT 10.1* 9.9*  ALBUMIN 3.1* 3.0*   No results for input(s): LIPASE, AMYLASE in the last 168 hours. No results for input(s): AMMONIA in the last 168 hours.  CBC: Recent Labs  Lab 08/30/18 1737 08/31/18 0320 09/01/18 0924  09/01/18 1659 09/01/18 1700 09/01/18 1835 09/02/18 1331 09/04/18 0623  WBC 19.2* 18.5* 16.8*  --   --   --  18.3* 15.8* 16.5*  NEUTROABS  11.3*  --  10.2*  --   --   --   --   --   --   HGB 5.8* 5.9* 6.1*   < > 7.8* 7.8* 6.1* 6.0* 8.1*  HCT 16.9* 16.6* 17.5*   < > 23.0* 23.0* 17.2* 17.0* 23.5*  MCV 116.6* 116.9* 115.9*  --   --   --  113.2* 112.6* 104.9*  PLT 250 255 247  --   --   --  256 259 233   < > = values in this interval not displayed.    Cardiac Enzymes: Recent Labs  Lab 08/30/18 1737 08/31/18 0320 08/31/18 0801 08/31/18 1312  TROPONINI 0.04* 0.03* 0.03* 0.03*    BNP: BNP (last 3 results) Recent Labs    09/02/18 1331 09/03/18 0459 09/04/18 0624  BNP 444.9* 467.0* 492.7*    ProBNP (last 3 results) No results for input(s): PROBNP in the last 8760 hours.    Other results:  Imaging: US Abdomen Complete  Result Date: 09/02/2018 CLINICAL DATA:  Possible cirrhosis and ascites, abdominal distension. Previous cholecystectomy. Sickle cell disease. EXAM: ABDOMEN ULTRASOUND COMPLETE COMPARISON:  CT abdomen 12/26/2016 FINDINGS: Gallbladder: Previous cholecystectomy. Common bile duct: Diameter: 3.3 mm. Liver: Mildly enlarged measuring 21.2 cm in greatest dimension. No focal lesion identified. Within normal limits in parenchymal  echogenicity. Portal vein is patent on color Doppler imaging with normal direction of blood flow towards the liver. IVC: No abnormality visualized. Pancreas: Not visualized. Spleen: Not visualized. Right Kidney: Length: 11.9 cm. Echogenicity within normal limits. No mass or hydronephrosis visualized. Left Kidney: Length: 11.4 cm. Echogenicity within normal limits. No mass or hydronephrosis visualized. Abdominal aorta: No aneurysm visualized. Other findings: No ascites. IMPRESSION: No acute findings. Mild hepatomegaly.  No ascites. Previous cholecystectomy. Pancreas and spleen not visualized. Electronically Signed   By: Marin Olp M.D.   On: 09/02/2018 16:11     Medications:     Scheduled Medications: . sodium chloride   Intravenous Once  . acetaZOLAMIDE  250 mg Oral BID  . enoxaparin (LOVENOX) injection  30 mg Subcutaneous QHS  . folic acid  1 mg Oral Daily  . magnesium oxide  400 mg Oral Daily  . metolazone  5 mg Oral BID  . sodium chloride flush  3 mL Intravenous Q12H    Infusions: . furosemide (LASIX) infusion 20 mg/hr (09/04/18 0525)    PRN Medications: ondansetron **OR** ondansetron (ZOFRAN) IV, sodium chloride flush   Assessment/Plan:   1. 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  - Main issue here is RV failure (PaPI < 1.0) in setting of longstanding PAH but her RV function looks ok on echo and Fick cardiac output very high on cath so may also have a component of high-output failure.  - May also have possible peripheral shunting falsely elevating Fick so will get echo with bubble - Volume status now finally improving. Continue lasix gtt at 20 and metolazone and diamox 250 bid - Co-ox 76% - Continue UNNA boots  2. PAH - moderate by RHC - multifactorial due to WHO Group II, III, V and high-output physiology  - VQ negative - PFTS with DLCO  36% c/w severe pulmonary arteriopathy  3. AKI - suspect due to renal congestion. Improving with decongestion  4. Hyperkalemia - due to AKI and potassium supplementation +/- hemolysis - resolved. K 4.6  5. Sickle  cell anemia - per priamry team. No evidence of crisis currently - received 2u RBCs on 6/21  6. Morbid obesity - needs weight loss  7. Severe OSA - continue CPAP  Length of Stay: 4   Arvilla Meresaniel Sanad Fearnow MD 09/04/2018, 8:34 AM  Advanced Heart Failure Team Pager [phone] (M-F; 7a - 4p)  Please contact [facility] for night-coverage after hours (4p -7a ) and weekends on [email or web address]

## 2018-09-04 NOTE — Progress Notes (Signed)
2nd unit of PRBC completed at 0400.  Pt tolerated well. Will draw all labs at 0600. Will continue to monitor. Jessie Foot, RN

## 2018-09-04 NOTE — Progress Notes (Signed)
Subjective: Pamela Hudson, a 33 year old female with a medical history significant for sickle cell disease, pulmonary hypertension, right-sided heart failure, severe sleep apnea, and anemia of chronic disease was admitted for acute on chronic right sided heart failure.  Patient states that she feels better today.  Lower extremity edema and exertional dyspnea has improved.  Patient continues to be diuresed per cardiology.  Patient is down another 8 pounds today following diuresis.  Patient received 2 units of PRBCs overnight.  Hemoglobin 8.2 on today.  Also, creatinine improving.  Patient denies headache, chest pain, shortness of breath, dysuria, nausea, vomiting, or diarrhea.  Objective:  Vital signs in last 24 hours:  Vitals:   09/04/18 0442 09/04/18 0444 09/04/18 0814 09/04/18 1130  BP: (!) 120/52  (!) 108/54 132/62  Pulse: (!) 104  99 100  Resp:   18 18  Temp: 98.3 F (36.8 C)  99.1 F (37.3 C) 98.2 F (36.8 C)  TempSrc: Oral  Oral Oral  SpO2: 100%  (!) 84% (!) 89%  Weight:  105.1 kg    Height:        Intake/Output from previous day:   Intake/Output Summary (Last 24 hours) at 09/04/2018 1610 Last data filed at 09/04/2018 1430 Gross per 24 hour  Intake 2460.66 ml  Output 9400 ml  Net -6939.34 ml   Physical Exam Constitutional:      General: She is not in acute distress. Cardiovascular:     Rate and Rhythm: Normal rate and regular rhythm.     Pulses: Normal pulses.     Heart sounds: Murmur present.     Comments: Bilateral lower extremity compression wraps in place. Pulmonary:     Effort: Pulmonary effort is normal.     Breath sounds: Normal breath sounds.  Abdominal:     General: Abdomen is flat. Bowel sounds are normal.  Skin:    General: Skin is warm.  Neurological:     General: No focal deficit present.     Mental Status: She is oriented to person, place, and time. Mental status is at baseline.  Psychiatric:        Mood and Affect: Mood normal.         Behavior: Behavior normal.        Thought Content: Thought content normal.        Judgment: Judgment normal.      Lab Results:  Basic Metabolic Panel:    Component Value Date/Time   NA 131 (L) 09/04/2018 0623   K 4.6 09/04/2018 0623   CL 98 09/04/2018 0623   CO2 24 09/04/2018 0623   BUN 60 (H) 09/04/2018 0623   CREATININE 1.64 (H) 09/04/2018 0623   GLUCOSE 209 (H) 09/04/2018 0623   CALCIUM 9.0 09/04/2018 0623   CALCIUM 8.4 (L) 03/25/2018 0457   CBC:    Component Value Date/Time   WBC 16.5 (H) 09/04/2018 0623   HGB 8.1 (L) 09/04/2018 0623   HCT 23.5 (L) 09/04/2018 0623   PLT 233 09/04/2018 0623   MCV 104.9 (H) 09/04/2018 0623   NEUTROABS 10.2 (H) 09/01/2018 0924   LYMPHSABS 3.5 09/01/2018 0924   MONOABS 2.3 (H) 09/01/2018 0924   EOSABS 0.3 09/01/2018 0924   BASOSABS 0.1 09/01/2018 0924    Recent Results (from the past 240 hour(s))  SARS Coronavirus 2 (Hosp order,Performed [facility] via [employer] ID)     Status: None   Collection Time: 08/30/18  6:19 PM   Specimen: Dry Nasal Swab (Abbott ID Now)  Result Value Ref Range Status   SARS Coronavirus 2 (Abbott ID Now) NEGATIVE NEGATIVE Final    Comment: (NOTE) Interpretive Result Comment(s): COVID 19 Positive SARS CoV 2 target nucleic acids are DETECTED. The SARS CoV 2 RNA is generally detectable in upper and lower respiratory specimens during the acute phase of infection.  Positive results are indicative of active infection with SARS CoV 2.  Clinical correlation with patient history and other diagnostic information is necessary to determine patient infection status.  Positive results do not rule out bacterial infection or coinfection with other viruses. The expected result is Negative. COVID 19 Negative SARS CoV 2 target nucleic acids are NOT DETECTED. The SARS CoV 2 RNA is generally detectable in upper and lower respiratory specimens during the acute phase of infection.  Negative results do not preclude SARS  CoV 2 infection, do not rule out coinfections with other pathogens, and should not be used as the sole basis for treatment or other patient management decisions.  Negative results must be combined with clinical  observations, patient history, and epidemiological information. The expected result is Negative. Invalid Presence or absence of SARS CoV 2 nucleic acids cannot be determined. Repeat testing was performed on the submitted specimen and repeated Invalid results were obtained.  If clinically indicated, additional testing on a new specimen with an alternate test methodology [id number]) is advised.  The SARS CoV 2 RNA is generally detectable in upper and lower respiratory specimens during the acute phase of infection. The expected result is Negative. Fact Sheet for Patients:  http://www.graves-ford.org/https://www.fda.gov/media/136524/download Fact Sheet for Healthcare Providers: EnviroConcern.sihttps://www.fda.gov/media/136523/download This test is not yet approved or cleared by the [place] FDA and has been authorized for detection and/or diagnosis of SARS CoV 2 by FDA under an Emergency Use Authorization (EUA).  This EUA will remain in effect (meaning this test can be used) for the duration of the COVID19 d eclaration under Section 564(b)(1) of the Act, 21 U.S.C. section 2231234348360bbb 3(b)(1), unless the authorization is terminated or revoked sooner. Performed at [facility] [place], [address] [place], [place] [address]   SARS Coronavirus 2 (CEPHEID- Performed in [facility] hospital lab), Hosp Order     Status: None   Collection Time: 08/31/18 12:13 AM   Specimen: Nasopharyngeal Swab  Result Value Ref Range Status   SARS Coronavirus 2 NEGATIVE NEGATIVE Final    Comment: (NOTE) If result is NEGATIVE SARS-CoV-2 target nucleic acids are NOT DETECTED. The SARS-CoV-2 RNA is generally detectable in upper and lower  respiratory specimens during the acute phase of infection. The lowest  concentration of SARS-CoV-2  viral copies this assay can detect is 250  copies / mL. A negative result does not preclude SARS-CoV-2 infection  and should not be used as the sole basis for treatment or other  patient management decisions.  A negative result may occur with  improper specimen collection / handling, submission of specimen other  than nasopharyngeal swab, presence of viral mutation(s) within the  areas targeted by this assay, and inadequate number of viral copies  (<250 copies / mL). A negative result must be combined with clinical  observations, patient history, and epidemiological information. If result is POSITIVE SARS-CoV-2 target nucleic acids are DETECTED. The SARS-CoV-2 RNA is generally detectable in upper and lower  respiratory specimens dur ing the acute phase of infection.  Positive  results are indicative of active infection with SARS-CoV-2.  Clinical  correlation with patient history and other diagnostic information is  necessary  to determine patient infection status.  Positive results do  not rule out bacterial infection or co-infection with other viruses. If result is PRESUMPTIVE POSTIVE SARS-CoV-2 nucleic acids MAY BE PRESENT.   A presumptive positive result was obtained on the submitted specimen  and confirmed on repeat testing.  While 2019 novel coronavirus  (SARS-CoV-2) nucleic acids may be present in the submitted sample  additional confirmatory testing may be necessary for epidemiological  and / or clinical management purposes  to differentiate between  SARS-CoV-2 and other Sarbecovirus currently known to infect humans.  If clinically indicated additional testing with an alternate test  methodology [phone]) is advised. The SARS-CoV-2 RNA is generally  detectable in upper and lower respiratory sp ecimens during the acute  phase of infection. The expected result is Negative. Fact Sheet for Patients:  StrictlyIdeas.no Fact Sheet for Healthcare  Providers: BankingDealers.co.za This test is not yet approved or cleared by the [place] FDA and has been authorized for detection and/or diagnosis of SARS-CoV-2 by FDA under an Emergency Use Authorization (EUA).  This EUA will remain in effect (meaning this test can be used) for the duration of the COVID-19 declaration under Section 564(b)(1) of the Act, 21 U.S.C. section 360bbb-3(b)(1), unless the authorization is terminated or revoked sooner. Performed at [facility], [place] [address] [place], [place] [address]     Studies/Results: No results found.  Medications: Scheduled Meds: . sodium chloride   Intravenous Once  . acetaZOLAMIDE  250 mg Oral BID  . enoxaparin (LOVENOX) injection  40 mg Subcutaneous QHS  . folic acid  1 mg Oral Daily  . magnesium oxide  400 mg Oral Daily  . metolazone  5 mg Oral BID  . sodium chloride flush  3 mL Intravenous Q12H   Continuous Infusions: . furosemide (LASIX) infusion 20 mg/hr (09/04/18 0525)   PRN Meds:.ondansetron **OR** ondansetron (ZOFRAN) IV, sodium chloride flush  Consultants:  Cardiology   Assessment/Plan: Active Problems:   Sickle cell anemia (HCC)   Elevated bilirubin   Hyponatremia   Leukocytosis   AKI (acute kidney injury) ([place])   Dyspnea   Essential hypertension   Anemia   Acute on chronic right-sided heart failure (HCC)   Elevated troponin   Other ascites  Right heart failure:  Continue with diuresis Defer to cardiology  Sickle cell disease: Hemoglobin improved to 8.2 following 2 units of PRBCs. Continue to monitor CBC closely. No evidence of sickle cell crisis.  Continue folic acid for bone marrow support.  Hyperkalemia: Resolved.  Potassium 4.6. BMP in a.m.  Morbid obesity: Continue heart healthy diet  Severe OSA: Continue CPAP  Hypertension: Stable.  Continue to monitor closely  Hypothyroidism: Stable.  Continue levothyroxine    Code Status:  Full Code Family Communication: N/A Disposition Plan: Not yet ready for discharge.  Anticipate discharge on 09/05/2018  Donia Pounds  APRN, MSN, FNP-C [facility] [place], [place] [address] [phone]  If 5PM-7AM, please contact night-coverage.  09/04/2018, 4:10 PM  LOS: 4 days

## 2018-09-04 NOTE — Progress Notes (Signed)
RT placed patient on CPAP for the night. Patient tolerating well at this time. No respiratory distress noted.

## 2018-09-05 LAB — CBC
HCT: 26.1 % — ABNORMAL LOW (ref 36.0–46.0)
Hemoglobin: 9.2 g/dL — ABNORMAL LOW (ref 12.0–15.0)
MCH: 36.4 pg — ABNORMAL HIGH (ref 26.0–34.0)
MCHC: 35.2 g/dL (ref 30.0–36.0)
MCV: 103.2 fL — ABNORMAL HIGH (ref 80.0–100.0)
Platelets: 243 10*3/uL (ref 150–400)
RBC: 2.53 MIL/uL — ABNORMAL LOW (ref 3.87–5.11)
WBC: 15.5 10*3/uL — ABNORMAL HIGH (ref 4.0–10.5)
nRBC: 3.8 % — ABNORMAL HIGH (ref 0.0–0.2)

## 2018-09-05 LAB — BPAM RBC
Blood Product Expiration Date: 202007172359
Blood Product Expiration Date: 202007172359
ISSUE DATE / TIME: 202006211820
ISSUE DATE / TIME: 202006220015
Unit Type and Rh: 5100
Unit Type and Rh: 5100

## 2018-09-05 LAB — MAGNESIUM: Magnesium: 2 mg/dL (ref 1.7–2.4)

## 2018-09-05 LAB — PATHOLOGIST SMEAR REVIEW: Path Review: 6222020

## 2018-09-05 LAB — TYPE AND SCREEN
ABO/RH(D): B POS
Antibody Screen: NEGATIVE
Unit division: 0
Unit division: 0

## 2018-09-05 LAB — BASIC METABOLIC PANEL
Anion gap: 9 (ref 5–15)
BUN: 59 mg/dL — ABNORMAL HIGH (ref 6–20)
CO2: 28 mmol/L (ref 22–32)
Calcium: 9.3 mg/dL (ref 8.9–10.3)
Chloride: 94 mmol/L — ABNORMAL LOW (ref 98–111)
Creatinine, Ser: 1.9 mg/dL — ABNORMAL HIGH (ref 0.44–1.00)
GFR calc Af Amer: 40 mL/min — ABNORMAL LOW (ref >60)
GFR calc non Af Amer: 35 mL/min — ABNORMAL LOW (ref >60)
Glucose, Bld: 182 mg/dL — ABNORMAL HIGH (ref 70–99)
Potassium: 4.3 mmol/L (ref 3.5–5.1)
Sodium: 131 mmol/L — ABNORMAL LOW (ref 135–145)

## 2018-09-05 MED ORDER — METOLAZONE 5 MG PO TABS: 5.0000 mg | ORAL_TABLET | Freq: Every day | ORAL | Status: DC

## 2018-09-05 NOTE — Progress Notes (Signed)
Subjective:  Pamela Hudson, a 33 year old female with a medical history significant for sickle cell disease, pulmonary hypertension, right-sided heart failure, severe sleep apnea, and anemia of chronic disease was admitted for acute on chronic right heart failure.  Patient states that she feels well today.  Patient continues to be diuresed per cardiology.  Patient is down a total of 29 pounds.  Patient is status post 2 units of PRBCs.  Hemoglobin 9.2 on today.  WBCs trending down.  No signs of infection or inflammation. Patient afebrile and maintaining oxygen saturation at 100% on RA.  Patient denies headache, dizziness, chest pain, shortness of breath, dysuria, nausea, vomiting, or diarrhea. Objective:  Vital signs in last 24 hours:  Vitals:   09/04/18 2357 09/05/18 0011 09/05/18 0422 09/05/18 1227  BP:  (!) 115/59 (!) 116/42 122/67  Pulse: (!) 116 (!) 115 (!) 105 98  Resp: 18   18  Temp:  98.2 F (36.8 C) 97.8 F (36.6 C) 98 F (36.7 C)  TempSrc:  Oral Oral Oral  SpO2: 99% 94% 100% 100%  Weight:   97.6 kg   Height:        Intake/Output from previous day:   Intake/Output Summary (Last 24 hours) at 09/05/2018 1657 Last data filed at 09/05/2018 1630 Gross per 24 hour  Intake 1223.47 ml  Output 1610910850 ml  Net -9626.53 ml   Physical Exam Constitutional:      Appearance: She is obese.  HENT:     Mouth/Throat:     Mouth: Mucous membranes are moist.  Eyes:     Pupils: Pupils are equal, round, and reactive to light.  Cardiovascular:     Rate and Rhythm: Normal rate and regular rhythm.     Heart sounds: Murmur present.  Abdominal:     General: Abdomen is flat. Bowel sounds are normal.  Skin:    General: Skin is warm and dry.  Neurological:     General: No focal deficit present.     Mental Status: She is alert. Mental status is at baseline.  Psychiatric:        Mood and Affect: Mood normal.        Behavior: Behavior normal.        Thought Content: Thought content  normal.        Judgment: Judgment normal.      Lab Results:  Basic Metabolic Panel:    Component Value Date/Time   NA 131 (L) 09/05/2018 0528   K 4.3 09/05/2018 0528   CL 94 (L) 09/05/2018 0528   CO2 28 09/05/2018 0528   BUN 59 (H) 09/05/2018 0528   CREATININE 1.90 (H) 09/05/2018 0528   GLUCOSE 182 (H) 09/05/2018 0528   CALCIUM 9.3 09/05/2018 0528   CALCIUM 8.4 (L) 03/25/2018 0457   CBC:    Component Value Date/Time   WBC 15.5 (H) 09/05/2018 1115   HGB 9.2 (L) 09/05/2018 1115   HCT 26.1 (L) 09/05/2018 1115   PLT 243 09/05/2018 1115   MCV 103.2 (H) 09/05/2018 1115   NEUTROABS 10.2 (H) 09/01/2018 0924   LYMPHSABS 3.5 09/01/2018 0924   MONOABS 2.3 (H) 09/01/2018 0924   EOSABS 0.3 09/01/2018 0924   BASOSABS 0.1 09/01/2018 0924    Recent Results (from the past 240 hour(s))  SARS Coronavirus 2 (Hosp order,Performed in [facility] lab via [employer] ID)     Status: None   Collection Time: 08/30/18  6:19 PM   Specimen: Dry Nasal Swab (Abbott ID Now)  Result Value  Ref Range Status   SARS Coronavirus 2 (Abbott ID Now) NEGATIVE NEGATIVE Final    Comment: (NOTE) Interpretive Result Comment(s): COVID 19 Positive SARS CoV 2 target nucleic acids are DETECTED. The SARS CoV 2 RNA is generally detectable in upper and lower respiratory specimens during the acute phase of infection.  Positive results are indicative of active infection with SARS CoV 2.  Clinical correlation with patient history and other diagnostic information is necessary to determine patient infection status.  Positive results do not rule out bacterial infection or coinfection with other viruses. The expected result is Negative. COVID 19 Negative SARS CoV 2 target nucleic acids are NOT DETECTED. The SARS CoV 2 RNA is generally detectable in upper and lower respiratory specimens during the acute phase of infection.  Negative results do not preclude SARS CoV 2 infection, do not rule out coinfections with other  pathogens, and should not be used as the sole basis for treatment or other patient management decisions.  Negative results must be combined with clinical  observations, patient history, and epidemiological information. The expected result is Negative. Invalid Presence or absence of SARS CoV 2 nucleic acids cannot be determined. Repeat testing was performed on the submitted specimen and repeated Invalid results were obtained.  If clinically indicated, additional testing on a new specimen with an alternate test methodology [phone]([id number]) is advised.  The SARS CoV 2 RNA is generally detectable in upper and lower respiratory specimens during the acute phase of infection. The expected result is Negative. Fact Sheet for Patients:  http://www.graves-[place].org/https://www.fda.gov/media/136524/download Fact Sheet for Healthcare Providers: EnviroConcern.sihttps://www.fda.gov/media/136523/download This test is not yet approved or cleared by the [place] FDA and has been authorized for detection and/or diagnosis of SARS CoV 2 by FDA under an Emergency Use Authorization (EUA).  This EUA will remain in effect (meaning this test can be used) for the duration of the COVID19 d eclaration under Section 564(b)(1) of the Act, 21 U.S.C. section [id number] 3(b)(1), unless the authorization is terminated or revoked sooner. Performed at [facility] [place], [address] [place], [place] [address]   SARS Coronavirus 2 (CEPHEID- Performed in [facility] hospital lab), Hosp Order     Status: None   Collection Time: 08/31/18 12:13 AM   Specimen: Nasopharyngeal Swab  Result Value Ref Range Status   SARS Coronavirus 2 NEGATIVE NEGATIVE Final    Comment: (NOTE) If result is NEGATIVE SARS-CoV-2 target nucleic acids are NOT DETECTED. The SARS-CoV-2 RNA is generally detectable in upper and lower  respiratory specimens during the acute phase of infection. The lowest  concentration of SARS-CoV-2 viral copies this assay can detect is 250  copies / mL.  A negative result does not preclude SARS-CoV-2 infection  and should not be used as the sole basis for treatment or other  patient management decisions.  A negative result may occur with  improper specimen collection / handling, submission of specimen other  than nasopharyngeal swab, presence of viral mutation(s) within the  areas targeted by this assay, and inadequate number of viral copies  (<250 copies / mL). A negative result must be combined with clinical  observations, patient history, and epidemiological information. If result is POSITIVE SARS-CoV-2 target nucleic acids are DETECTED. The SARS-CoV-2 RNA is generally detectable in upper and lower  respiratory specimens dur ing the acute phase of infection.  Positive  results are indicative of active infection with SARS-CoV-2.  Clinical  correlation with patient history and other diagnostic information is  necessary to determine  patient infection status.  Positive results do  not rule out bacterial infection or co-infection with other viruses. If result is PRESUMPTIVE POSTIVE SARS-CoV-2 nucleic acids MAY BE PRESENT.   A presumptive positive result was obtained on the submitted specimen  and confirmed on repeat testing.  While 2019 novel coronavirus  (SARS-CoV-2) nucleic acids may be present in the submitted sample  additional confirmatory testing may be necessary for epidemiological  and / or clinical management purposes  to differentiate between  SARS-CoV-2 and other Sarbecovirus currently known to infect humans.  If clinically indicated additional testing with an alternate test  methodology [phone]) is advised. The SARS-CoV-2 RNA is generally  detectable in upper and lower respiratory sp ecimens during the acute  phase of infection. The expected result is Negative. Fact Sheet for Patients:  StrictlyIdeas.no Fact Sheet for Healthcare Providers: BankingDealers.co.za This test is not  yet approved or cleared by the [place] FDA and has been authorized for detection and/or diagnosis of SARS-CoV-2 by FDA under an Emergency Use Authorization (EUA).  This EUA will remain in effect (meaning this test can be used) for the duration of the COVID-19 declaration under Section 564(b)(1) of the Act, 21 U.S.C. section 360bbb-3(b)(1), unless the authorization is terminated or revoked sooner. Performed at [facility], [place] [address], [place], [place] [address]     Studies/Results: No results found.  Medications: Scheduled Meds: . sodium chloride   Intravenous Once  . acetaZOLAMIDE  250 mg Oral BID  . enoxaparin (LOVENOX) injection  40 mg Subcutaneous QHS  . folic acid  1 mg Oral Daily  . magnesium oxide  400 mg Oral Daily  . [START ON 09/06/2018] metolazone  5 mg Oral Daily  . sodium chloride flush  3 mL Intravenous Q12H   Continuous Infusions: . furosemide (LASIX) infusion 20 mg/hr (09/05/18 0621)   PRN Meds:.ondansetron **OR** ondansetron (ZOFRAN) IV, sodium chloride flush  Consultants:  Cardiology   Assessment/Plan: Active Problems:   Sickle cell anemia (HCC)   Elevated bilirubin   Hyponatremia   Leukocytosis   AKI (acute kidney injury) ([place])   Dyspnea   Essential hypertension   Anemia   Acute on chronic right-sided heart failure (HCC)   Elevated troponin   Other ascites    Right heart failure: Continue with diuresis per cardiology.  Appreciate cardiology input and management  Sickle cell disease: Patient status post 2 units PRBCs.  Hemoglobin is improved to 9.2. Continue to monitor CBC closely. No evidence of sickle cell crisis. Patient advised to ambulate  Hypokalemia: Resolved.  Potassium 4.3 on today. Continue to follow closely.  Severe OSA: Continue CPAP at bedtime  Hypertension: Stable.  Continue to monitor closely.  Hypothyroidism: Stable.  Continue levothyroxine  Code Status: Full Code Family  Communication: N/A Disposition Plan: Not yet ready for discharge  [place], MSN, FNP-C [facility] [address] [place], [place] [address] [phone]  If 5PM-7AM, please contact night-coverage.  09/05/2018, 4:57 PM  LOS: 5 days

## 2018-09-05 NOTE — Progress Notes (Signed)
Advanced Heart Failure Rounding Note   Subjective:    Remains on lasix 20/hr and metolazone 5 bid. Diuresing very well  Down another 16 pounds.  (29 pounds total)   Continues to feel better. Less SOB. No orthopnea or PND. Bloating improved. Creatinine back up slightly to 1.9. Co-ox 76%   Objective:   Weight Range:  Vital Signs:   Temp:  [97.8 F (36.6 C)-98.7 F (37.1 C)] 97.8 F (36.6 C) (06/23 0422) Pulse Rate:  [100-116] 105 (06/23 0422) Resp:  [18] 18 (06/22 2357) BP: (114-132)/(42-70) 116/42 (06/23 0422) SpO2:  [89 %-100 %] 100 % (06/23 0422) Weight:  [97.6 kg] 97.6 kg (06/23 0422) Last BM Date: 09/02/18  Weight change: Filed Weights   09/03/18 0515 09/04/18 0444 09/05/18 0422  Weight: 108.6 kg 105.1 kg 97.6 kg    Intake/Output:   Intake/Output Summary (Last 24 hours) at 09/05/2018 1049 Last data filed at 09/05/2018 0847 Gross per 24 hour  Intake 1223.47 ml  Output 5366411150 ml  Net -9926.53 ml     Physical Exam: General:  Sitting up on side of bed. No resp difficulty General:  Well appearing. No resp difficulty HEENT: normal Neck: supple. JVP to ear Carotids 2+ bilat; no bruits. No lymphadenopathy or thryomegaly appreciated. Cor: PMI nondisplaced.tachy regular + RV lift 2/6 TR Lungs: clear Abdomen: obese soft, nontender, + distended. No hepatosplenomegaly. No bruits or masses. Good bowel sounds. Extremities: no cyanosis, clubbing, rash, 3+ diffuse edema + UNNA  Neuro: alert & orientedx3, cranial nerves grossly intact. moves all 4 extremities w/o difficulty. Affect pleasant    Telemetry: Sinus tach 100-105 Personally reviewed   Labs: Basic Metabolic Panel: Recent Labs  Lab 08/31/18 0320 09/01/18 0924  09/01/18 1700 09/01/18 1835 09/02/18 1331 09/03/18 0459 09/04/18 0623 09/05/18 0528  NA 133* 133*   < > 137  --  132* 131* 131* 131*  K 5.0 5.0   < > 5.6*  --  5.3* 5.3* 4.6 4.3  CL 104 103  --   --   --  102 101 98 94*  CO2 21* 20*  --   --    --  21* 21* 24 28  GLUCOSE 136* 121*  --   --   --  186* 182* 209* 182*  BUN 60* 67*  --   --   --  63* 64* 60* 59*  CREATININE 1.62* 1.75*  --   --  1.97* 1.88* 1.97* 1.64* 1.90*  CALCIUM 8.7* 8.5*  --   --   --  8.9 8.9 9.0 9.3  MG 2.2  --   --   --   --   --   --   --  2.0  PHOS 4.9*  --   --   --   --   --   --   --   --    < > = values in this interval not displayed.    Liver Function Tests: Recent Labs  Lab 08/30/18 1737 08/31/18 0320  AST 103* 98*  ALT 84* 84*  ALKPHOS 107 96  BILITOT 8.8* 8.0*  PROT 10.1* 9.9*  ALBUMIN 3.1* 3.0*   No results for input(s): LIPASE, AMYLASE in the last 168 hours. No results for input(s): AMMONIA in the last 168 hours.  CBC: Recent Labs  Lab 08/30/18 1737 08/31/18 0320 09/01/18 0924  09/01/18 1659 09/01/18 1700 09/01/18 1835 09/02/18 1331 09/04/18 0623  WBC 19.2* 18.5* 16.8*  --   --   --  18.3* 15.8* 16.5*  NEUTROABS 11.3*  --  10.2*  --   --   --   --   --   --   HGB 5.8* 5.9* 6.1*   < > 7.8* 7.8* 6.1* 6.0* 8.1*  HCT 16.9* 16.6* 17.5*   < > 23.0* 23.0* 17.2* 17.0* 23.5*  MCV 116.6* 116.9* 115.9*  --   --   --  113.2* 112.6* 104.9*  PLT 250 255 247  --   --   --  256 259 233   < > = values in this interval not displayed.    Cardiac Enzymes: Recent Labs  Lab 08/30/18 1737 08/31/18 0320 08/31/18 0801 08/31/18 1312  TROPONINI 0.04* 0.03* 0.03* 0.03*    BNP: BNP (last 3 results) Recent Labs    09/02/18 1331 09/03/18 0459 09/04/18 0624  BNP 444.9* 467.0* 492.7*    ProBNP (last 3 results) No results for input(s): PROBNP in the last 8760 hours.    Other results:  Imaging: No results found.   Medications:     Scheduled Medications: . sodium chloride   Intravenous Once  . acetaZOLAMIDE  250 mg Oral BID  . enoxaparin (LOVENOX) injection  40 mg Subcutaneous QHS  . folic acid  1 mg Oral Daily  . magnesium oxide  400 mg Oral Daily  . metolazone  5 mg Oral BID  . sodium chloride flush  3 mL Intravenous Q12H     Infusions: . furosemide (LASIX) infusion 20 mg/hr (09/05/18 0621)    PRN Medications: ondansetron **OR** ondansetron (ZOFRAN) IV, sodium chloride flush   Assessment/Plan:   1. 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  - Main issue here is RV failure (PaPI < 1.0) in setting of longstanding PAH but her RV function looks ok on echo and Fick cardiac output very high on cath so may also have a component of high-output failure.  - May also have possible peripheral shunting falsely elevating Fick so will get echo with bubble - Volume status improving greatly but still markedly volume overloaded. Continue lasix gtt at 20. Will decrease metolazone to daily. Continue diamox 250 bid - Continue UNNA boots  2. 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 AKI - suspect due to renal congestion but need to watch out for overly rapid diuresis. Cut back metolazone today  4. Hyperkalemia - due to AKI and potassium supplementation +/- hemolysis - resolved. K 43  5. Sickle cell anemia - per priamry team. No evidence of crisis currently - received 2u RBCs on 6/21  6. Morbid obesity - needs weight loss  7. Severe OSA - continue CPAP  Length of Stay: 5   Glori Bickers MD 09/05/2018, 10:49 AM  Advanced Heart Failure Team Pager [phone] (M-F; 7a - 4p)  Please contact [facility] for night-coverage after hours (4p -7a ) and weekends on [email or web address]

## 2018-09-05 NOTE — Progress Notes (Signed)
Patient refused to have RT place on CPAP. Stated she would have the RN do it later.

## 2018-09-06 DIAGNOSIS — R0601 Orthopnea: Secondary | ICD-10-CM

## 2018-09-06 LAB — BASIC METABOLIC PANEL
Anion gap: 13 (ref 5–15)
BUN: 63 mg/dL — ABNORMAL HIGH (ref 6–20)
CO2: 29 mmol/L (ref 22–32)
Calcium: 9.4 mg/dL (ref 8.9–10.3)
Chloride: 88 mmol/L — ABNORMAL LOW (ref 98–111)
Creatinine, Ser: 2.02 mg/dL — ABNORMAL HIGH (ref 0.44–1.00)
GFR calc Af Amer: 37 mL/min — ABNORMAL LOW (ref >60)
GFR calc non Af Amer: 32 mL/min — ABNORMAL LOW (ref >60)
Glucose, Bld: 226 mg/dL — ABNORMAL HIGH (ref 70–99)
Potassium: 3.9 mmol/L (ref 3.5–5.1)
Sodium: 130 mmol/L — ABNORMAL LOW (ref 135–145)

## 2018-09-06 LAB — COOXEMETRY PANEL
Carboxyhemoglobin: 5.6 % (ref 0.5–1.5)
Methemoglobin: 1.9 % — ABNORMAL HIGH (ref 0.0–1.5)
O2 Saturation: 85.5 %
Total hemoglobin: 9.1 g/dL — ABNORMAL LOW (ref 12.0–16.0)

## 2018-09-06 MED ORDER — TRAMADOL HCL 50 MG PO TABS
50.0000 mg | ORAL_TABLET | Freq: Once | ORAL | Status: AC
  Administered 2018-09-06: 05:00:00 50 mg via ORAL
  Filled 2018-09-06: qty 1

## 2018-09-06 NOTE — Progress Notes (Addendum)
Pt called asking for something for pain. She states her R ankle area is aching and she is not able to rest. Pt has UNNA boot in place but sock removed per pt request for feeling tight. Pt requested Tylenol but had no PRN pain med ordered. Inadvertently paged Cardiology Provider on call and verbal order for Tylenol 1000 mg Q6 hr PRN received. However-pt has allergy to Percocet and states she is not sure if her allergy is to the Oxycodone or Acetaminophen. Pt states she typically takes PO Dilaudid for her Sickle Cell Pain but this is not as severe. Pt agreeable to elevate extremity and apply heat pack. Will continue to monitor. Jessie Foot, RN

## 2018-09-06 NOTE — Progress Notes (Addendum)
Pt continues to complain of RLE "sore and achy", states heat helped some but would like Ibuprofen or Aleeve. Tylene Fantasia NP w/TRH paged. Awaiting orders. Will continue to monitor. Jessie Foot, RN

## 2018-09-06 NOTE — Progress Notes (Signed)
Notified by Respiratory Carboxyhemoglobin 5.6. Specimen redrawn to verify. Jessie Foot, RN

## 2018-09-06 NOTE — Progress Notes (Signed)
RT offered to place pt on CPAP dream station and pt stated the RN will help/assist her with placement when she is ready. Pt on auto titrate max 20 min 6 with no O2 bled into the system. Pt respiratory status stable at this time. RT will continue to monitor.

## 2018-09-06 NOTE — Progress Notes (Signed)
Advanced Heart Failure Rounding Note   Subjective:    Remains on lasix 20/hr and metolazone 5 daily and diamox 250 bid.   Weight down another 24 pounds overnight (54 pounds overall)  Breathing much better. No orthopnea or PND. Still with some edema and bloating. No dizziness. Creatinine up some  Objective:   Weight Range:  Vital Signs:   Temp:  [97.9 F (36.6 C)-99.1 F (37.3 C)] 99.1 F (37.3 C) (06/24 0453) Pulse Rate:  [98-113] 113 (06/24 0453) Resp:  [18] 18 (06/24 0453) BP: (111-122)/(56-76) 111/56 (06/24 0453) SpO2:  [96 %-100 %] 96 % (06/24 0453) Weight:  [87 kg] 87 kg (06/24 0453) Last BM Date: 09/05/18  Weight change: Filed Weights   09/04/18 0444 09/05/18 0422 09/06/18 0453  Weight: 105.1 kg 97.6 kg 87 kg    Intake/Output:   Intake/Output Summary (Last 24 hours) at 09/06/2018 0938 Last data filed at 09/06/2018 0913 Gross per 24 hour  Intake 360 ml  Output 8600 ml  Net -8240 ml     Physical Exam: General:  Sitting up on side of bed No respiratory difficulty HEENT: normal Neck: supple. JVP to jaw. Carotids 2+ bilat; no bruits. No lymphadenopathy or thryomegaly appreciated. Cor: PMI nondisplaced. Regular tachy + RV lift 2/6 TR Lungs: clear Abdomen: obese soft, nontender, nondistended. No hepatosplenomegaly. No bruits or masses. Good bowel sounds. Extremities: no cyanosis, clubbing, rash, 2+ edema + UNNA boots Neuro: alert & orientedx3, cranial nerves grossly intact. moves all 4 extremities w/o difficulty. Affect pleasant   Telemetry: Sinus tach 100-110 Personally reviewed   Labs: Basic Metabolic Panel: Recent Labs  Lab 08/31/18 0320  09/02/18 1331 09/03/18 0459 09/04/18 0623 09/05/18 0528 09/06/18 0529  NA 133*   < > 132* 131* 131* 131* 130*  K 5.0   < > 5.3* 5.3* 4.6 4.3 3.9  CL 104   < > 102 101 98 94* 88*  CO2 21*   < > 21* 21* 24 28 29   GLUCOSE 136*   < > 186* 182* 209* 182* 226*  BUN 60*   < > 63* 64* 60* 59* 63*  CREATININE  1.62*   < > 1.88* 1.97* 1.64* 1.90* 2.02*  CALCIUM 8.7*   < > 8.9 8.9 9.0 9.3 9.4  MG 2.2  --   --   --   --  2.0  --   PHOS 4.9*  --   --   --   --   --   --    < > = values in this interval not displayed.    Liver Function Tests: Recent Labs  Lab 08/30/18 1737 08/31/18 0320  AST 103* 98*  ALT 84* 84*  ALKPHOS 107 96  BILITOT 8.8* 8.0*  PROT 10.1* 9.9*  ALBUMIN 3.1* 3.0*   No results for input(s): LIPASE, AMYLASE in the last 168 hours. No results for input(s): AMMONIA in the last 168 hours.  CBC: Recent Labs  Lab 08/30/18 1737  09/01/18 0924  09/01/18 1700 09/01/18 1835 09/02/18 1331 09/04/18 0623 09/05/18 1115  WBC 19.2*   < > 16.8*  --   --  18.3* 15.8* 16.5* 15.5*  NEUTROABS 11.3*  --  10.2*  --   --   --   --   --   --   HGB 5.8*   < > 6.1*   < > 7.8* 6.1* 6.0* 8.1* 9.2*  HCT 16.9*   < > 17.5*   < > 23.0* 17.2* 17.0*  23.5* 26.1*  MCV 116.6*   < > 115.9*  --   --  113.2* 112.6* 104.9* 103.2*  PLT 250   < > 247  --   --  256 259 233 243   < > = values in this interval not displayed.    Cardiac Enzymes: Recent Labs  Lab 08/30/18 1737 08/31/18 0320 08/31/18 0801 08/31/18 1312  TROPONINI 0.04* 0.03* 0.03* 0.03*    BNP: BNP (last 3 results) Recent Labs    09/02/18 1331 09/03/18 0459 09/04/18 0624  BNP 444.9* 467.0* 492.7*    ProBNP (last 3 results) No results for input(s): PROBNP in the last 8760 hours.    Other results:  Imaging: No results found.   Medications:     Scheduled Medications: . sodium chloride   Intravenous Once  . acetaZOLAMIDE  250 mg Oral BID  . enoxaparin (LOVENOX) injection  40 mg Subcutaneous QHS  . folic acid  1 mg Oral Daily  . magnesium oxide  400 mg Oral Daily  . metolazone  5 mg Oral Daily  . sodium chloride flush  3 mL Intravenous Q12H    Infusions: . furosemide (LASIX) infusion 20 mg/hr (09/05/18 1835)    PRN Medications: ondansetron **OR** ondansetron (ZOFRAN) IV, sodium chloride flush    Assessment/Plan:   1. 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  - Main issue here is RV failure (PaPI < 1.0) in setting of longstanding PAH but her RV function looks ok on echo and Fick cardiac output very high on cath so may also have a component of high-output failure.  - May also have possible peripheral shunting falsely elevating Fick so can consider echo with bubble - Volume status improving greatly but still volume overloaded - She is diuresing very briskly. Creatinine up. HCT is hemoconcentrated. Will stop metoalzone and slow lasix gtt down to 15 to allow cap refill rate to catch up. Continue diamox 250 bid - Continue UNNA boots  2. 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 AKI - suspect due to renal congestion but need to watch out for overly rapid diuresis. See plan as above  4. Hyperkalemia - due to AKI and potassium supplementation +/- hemolysis - resolved.  5. Sickle cell anemia - per priamry team. No evidence of crisis currently - received 2u RBCs on 6/21  6. Morbid obesity - needs weight loss  7. Severe OSA - continue CPAP  Length of Stay: 6   Arvilla Meresaniel Adira Limburg MD 09/06/2018, 9:38 AM  Advanced Heart Failure Team Pager [phone] (M-F; 7a - 4p)  Please contact [facility] for night-coverage after hours (4p -7a ) and weekends on [email or web address]

## 2018-09-06 NOTE — Progress Notes (Addendum)
Subjective: Pamela Hudson, a 33 year old female with a medical history significant for sickle cell disease, pulmonary hypertension, right hear failure, severe sleep apnea, and anemia of chronic disease was admitted for acute on chronic right heart failure.  Patient is down 54 pounds overall following rapid diuresis.  She states that she feels well.  She denies shortness of breath, chest pain, dizziness, dysuria, nausea, vomiting, or diarrhea.  Patient afebrile and maintaining oxygen saturation at 100% on RA.   Objective:  Vital signs in last 24 hours:  Vitals:   09/05/18 1715 09/05/18 2057 09/06/18 0445 09/06/18 0453  BP: 116/76 121/60  (!) 111/56  Pulse: 100 100  (!) 113  Resp:  18  18  Temp: 97.9 F (36.6 C) 98.3 F (36.8 C)  99.1 F (37.3 C)  TempSrc: Oral Oral Oral Oral  SpO2: 100% 100%  96%  Weight:    87 kg  Height:        Intake/Output from previous day:   Intake/Output Summary (Last 24 hours) at 09/06/2018 1242 Last data filed at 09/06/2018 1217 Gross per 24 hour  Intake 840 ml  Output 8100 ml  Net -7260 ml    Physical Exam HENT:     Head: Normocephalic.     Mouth/Throat:     Mouth: Mucous membranes are moist.     Pharynx: Oropharynx is clear.  Eyes:     Pupils: Pupils are equal, round, and reactive to light.  Cardiovascular:     Rate and Rhythm: Normal rate and regular rhythm.     Heart sounds: Murmur present.  Pulmonary:     Effort: Pulmonary effort is normal.     Breath sounds: Normal breath sounds.  Abdominal:     General: Bowel sounds are normal.  Musculoskeletal: Normal range of motion.  Skin:    General: Skin is warm.  Neurological:     General: No focal deficit present.     Mental Status: She is oriented to person, place, and time. Mental status is at baseline.  Psychiatric:        Mood and Affect: Mood normal.        Behavior: Behavior normal.        Thought Content: Thought content normal.        Judgment: Judgment normal.     Lab  Results:  Basic Metabolic Panel:    Component Value Date/Time   NA 130 (L) 09/06/2018 0529   K 3.9 09/06/2018 0529   CL 88 (L) 09/06/2018 0529   CO2 29 09/06/2018 0529   BUN 63 (H) 09/06/2018 0529   CREATININE 2.02 (H) 09/06/2018 0529   GLUCOSE 226 (H) 09/06/2018 0529   CALCIUM 9.4 09/06/2018 0529   CALCIUM 8.4 (L) 03/25/2018 0457   CBC:    Component Value Date/Time   WBC 15.5 (H) 09/05/2018 1115   HGB 9.2 (L) 09/05/2018 1115   HCT 26.1 (L) 09/05/2018 1115   PLT 243 09/05/2018 1115   MCV 103.2 (H) 09/05/2018 1115   NEUTROABS 10.2 (H) 09/01/2018 0924   LYMPHSABS 3.5 09/01/2018 0924   MONOABS 2.3 (H) 09/01/2018 0924   EOSABS 0.3 09/01/2018 0924   BASOSABS 0.1 09/01/2018 0924    Recent Results (from the past 240 hour(s))  SARS Coronavirus 2 (Hosp order,Performed in [facility] lab via [employer] ID)     Status: None   Collection Time: 08/30/18  6:19 PM   Specimen: Dry Nasal Swab ([employer] ID Now)  Result Value Ref Range Status  SARS Coronavirus 2 (A[employer] ID Now) NEGATIVE NEGATIVE Final    Comment: (NOTE) Interpretive Result Comment(s): COVID 19 Positive SARS CoV 2 target nucleic acids are DETECTED. The SARS CoV 2 RNA is generally detectable in upper and lower respiratory specimens during the acute phase of infection.  Positive results are indicative of active infection with SARS CoV 2.  Clinical correlation with patient history and other diagnostic information is necessary to determine patient infection status.  Positive results do not rule out bacterial infection or coinfection with other viruses. The expected result is Negative. COVID 19 Negative SARS CoV 2 target nucleic acids are NOT DETECTED. The SARS CoV 2 RNA is generally detectable in upper and lower respiratory specimens during the acute phase of infection.  Negative results do not preclude SARS CoV 2 infection, do not rule out coinfections with other pathogens, and should not be used as the sole basis for  treatment or other patient management decisions.  Negative results must be combined with clinical  observations, patient history, and epidemiological information. The expected result is Negative. Invalid Presence or absence of SARS CoV 2 nucleic acids cannot be determined. Repeat testing was performed on the submitted specimen and repeated Invalid results were obtained.  If clinically indicated, additional testing on a new specimen with an alternate test methodology [phone]([id number]) is advised.  The SARS CoV 2 RNA is generally detectable in upper and lower respiratory specimens during the acute phase of infection. The expected result is Negative. Fact Sheet for Patients:  http://www.graves-ford.org/https://www.fda.gov/media/136524/download Fact Sheet for Healthcare Providers: EnviroConcern.sihttps://www.fda.gov/media/136523/download This test is not yet approved or cleared by the [place] FDA and has been authorized for detection and/or diagnosis of SARS CoV 2 by FDA under an Emergency Use Authorization (EUA).  This EUA will remain in effect (meaning this test can be used) for the duration of the COVID19 d eclaration under Section 564(b)(1) of the Act, 21 U.S.C. section [phone] 3(b)(1), unless the authorization is terminated or revoked sooner. Performed at [facility] [place], [address] [place], [place] [address]   SARS Coronavirus 2 (CEPHEID- Performed in [facility] hospital lab), Hosp Order     Status: None   Collection Time: 08/31/18 12:13 AM   Specimen: Nasopharyngeal Swab  Result Value Ref Range Status   SARS Coronavirus 2 NEGATIVE NEGATIVE Final    Comment: (NOTE) If result is NEGATIVE SARS-CoV-2 target nucleic acids are NOT DETECTED. The SARS-CoV-2 RNA is generally detectable in upper and lower  respiratory specimens during the acute phase of infection. The lowest  concentration of SARS-CoV-2 viral copies this assay can detect is 250  copies / mL. A negative result does not preclude SARS-CoV-2 infection   and should not be used as the sole basis for treatment or other  patient management decisions.  A negative result may occur with  improper specimen collection / handling, submission of specimen other  than nasopharyngeal swab, presence of viral mutation(s) within the  areas targeted by this assay, and inadequate number of viral copies  (<250 copies / mL). A negative result must be combined with clinical  observations, patient history, and epidemiological information. If result is POSITIVE SARS-CoV-2 target nucleic acids are DETECTED. The SARS-CoV-2 RNA is generally detectable in upper and lower  respiratory specimens dur ing the acute phase of infection.  Positive  results are indicative of active infection with SARS-CoV-2.  Clinical  correlation with patient history and other diagnostic information is  necessary to determine patient infection status.  Positive  results do  not rule out bacterial infection or co-infection with other viruses. If result is PRESUMPTIVE POSTIVE SARS-CoV-2 nucleic acids MAY BE PRESENT.   A presumptive positive result was obtained on the submitted specimen  and confirmed on repeat testing.  While 2019 novel coronavirus  (SARS-CoV-2) nucleic acids may be present in the submitted sample  additional confirmatory testing may be necessary for epidemiological  and / or clinical management purposes  to differentiate between  SARS-CoV-2 and other Sarbecovirus currently known to infect humans.  If clinically indicated additional testing with an alternate test  methodology [phone]([id number]) is advised. The SARS-CoV-2 RNA is generally  detectable in upper and lower respiratory sp ecimens during the acute  phase of infection. The expected result is Negative. Fact Sheet for Patients:  BoilerBrush.com.cyhttps://www.fda.gov/media/136312/download Fact Sheet for Healthcare Providers: https://pope.com/https://www.fda.gov/media/136313/download This test is not yet approved or cleared by the [place] FDA  and has been authorized for detection and/or diagnosis of SARS-CoV-2 by FDA under an Emergency Use Authorization (EUA).  This EUA will remain in effect (meaning this test can be used) for the duration of the COVID-19 declaration under Section 564(b)(1) of the Act, 21 U.S.C. section 360bbb-3(b)(1), unless the authorization is terminated or revoked sooner. Performed at [facility], [address] [place], [place] [address]     Studies/Results: No results found.  Medications: Scheduled Meds: . sodium chloride   Intravenous Once  . acetaZOLAMIDE  250 mg Oral BID  . enoxaparin (LOVENOX) injection  40 mg Subcutaneous QHS  . folic acid  1 mg Oral Daily  . magnesium oxide  400 mg Oral Daily  . sodium chloride flush  3 mL Intravenous Q12H   Continuous Infusions: . furosemide (LASIX) infusion 15 mg/hr (09/06/18 1015)   PRN Meds:.ondansetron **OR** ondansetron (ZOFRAN) IV, sodium chloride flush  Consultants:  Cardiology   Assessment/Plan: Active Problems:   Sickle cell anemia (HCC)   Elevated bilirubin   Hyponatremia   Leukocytosis   AKI (acute kidney injury) (HCC)   Dyspnea   Essential hypertension   Anemia   Acute on chronic right-sided heart failure (HCC)   Elevated troponin   Other ascites   Right heart failure: Patient continues to diurese per cardiology.  Creatinine elevated on today. Metolazone discontinued and furosemide GTT down to 15 Diamox 250 mg twice daily Defer to cardiology for treatment plan.  Appreciate cardiology's input and management.  Acute kidney injury: Suspected to be due to renal congestion.  Creatinine elevated 2.0 today, most likely in response to rapid diuresis.  Medications adjusted per cardiology  Hypokalemia: Resolved.  Continue to monitor.  Sickle cell disease: No evidence of acute sickle cell crisis. Hemoglobin stable.  Continue to monitor CBC closely.  Repeat in a.m. Severe OSA: Continue CPAP at  bedtime  Hypertension: Stable.  Continue to monitor closely  Hypothyroidism: Stable.  Continue levothyroxine    Code Status: Full Code Family Communication: N/A Disposition Plan: Not yet ready for discharge    Nolon NationsLachina Moore Bannon Giammarco  APRN, MSN, FNP-C [facility] [address]  [place], [place] [address] [phone]  If 5PM-7AM, please contact night-coverage.  09/06/2018, 12:42 PM  LOS: 6 days

## 2018-09-07 LAB — BASIC METABOLIC PANEL
Anion gap: 12 (ref 5–15)
BUN: 72 mg/dL — ABNORMAL HIGH (ref 6–20)
CO2: 31 mmol/L (ref 22–32)
Calcium: 9.1 mg/dL (ref 8.9–10.3)
Chloride: 88 mmol/L — ABNORMAL LOW (ref 98–111)
Creatinine, Ser: 1.91 mg/dL — ABNORMAL HIGH (ref 0.44–1.00)
GFR calc Af Amer: 40 mL/min — ABNORMAL LOW (ref >60)
GFR calc non Af Amer: 34 mL/min — ABNORMAL LOW (ref >60)
Glucose, Bld: 147 mg/dL — ABNORMAL HIGH (ref 70–99)
Potassium: 3.7 mmol/L (ref 3.5–5.1)
Sodium: 131 mmol/L — ABNORMAL LOW (ref 135–145)

## 2018-09-07 LAB — COOXEMETRY PANEL
Carboxyhemoglobin: 5.4 % (ref 0.5–1.5)
Methemoglobin: 1.6 % — ABNORMAL HIGH (ref 0.0–1.5)
O2 Saturation: 79.6 %
Total hemoglobin: 9.4 g/dL — ABNORMAL LOW (ref 12.0–16.0)

## 2018-09-07 NOTE — Care Management (Signed)
Patient from home with family, independent PTA.  Continues to diurese. No HH PT or DME needs. Has CPAP at home.  Will ned f/u in HF Clinic and Paramedicine at d/c  PCP Pamela Jews, PA-C Medicaid coverage.

## 2018-09-07 NOTE — Progress Notes (Addendum)
Advanced Heart Failure Rounding Note   Subjective:    Lasix gtt turned down to 15/hr and metolazone stopped yesterday due to AKI in setting of rapid diuresis. Remains on diamox 250 bid.   Weight down another 13 pounds overnight (67 pounds overall). Now 178. Previous baseline about 173. BMET pending. Co-ox 80%  Breathing much better. No orthopnea or PND. Bloating improved. Anxious to go home.   Objective:   Weight Range:  Vital Signs:   Temp:  [98.6 F (37 C)-98.8 F (37.1 C)] 98.8 F (37.1 C) (06/25 0613) Pulse Rate:  [101-109] 101 (06/25 0613) Resp:  [18] 18 (06/25 0613) BP: (119-123)/(61-62) 119/61 (06/25 0613) SpO2:  [100 %] 100 % (06/25 40980613) Weight:  [80.7 kg] 80.7 kg (06/25 0611) Last BM Date: 09/05/18  Weight change: Filed Weights   09/05/18 0422 09/06/18 0453 09/07/18 0611  Weight: 97.6 kg 87 kg 80.7 kg    Intake/Output:   Intake/Output Summary (Last 24 hours) at 09/07/2018 11910652 Last data filed at 09/07/2018 0500 Gross per 24 hour  Intake 960 ml  Output 8700 ml  Net -7740 ml     Physical Exam: General:  Sitting in chair No respiratory difficulty HEENT: normal x scleral icterus Neck: supple. JVP 10. Carotids 2+ bilat; no bruits. No lymphadenopathy or thryomegaly appreciated. Cor: PMI nondisplaced. Regular tachy +RV lift 2/6 TR Lungs: clear Abdomen: soft, nontender, nondistended. No hepatosplenomegaly. No bruits or masses. Good bowel sounds. Extremities: no cyanosis, clubbing, rash, 1-2+ edema into thighs Neuro: alert & orientedx3, cranial nerves grossly intact. moves all 4 extremities w/o difficulty. Affect pleasant   Telemetry: Sinus tach 100-105 Personally reviewed   Labs: Basic Metabolic Panel: Recent Labs  Lab 09/02/18 1331 09/03/18 0459 09/04/18 0623 09/05/18 0528 09/06/18 0529  NA 132* 131* 131* 131* 130*  K 5.3* 5.3* 4.6 4.3 3.9  CL 102 101 98 94* 88*  CO2 21* 21* 24 28 29   GLUCOSE 186* 182* 209* 182* 226*  BUN 63* 64* 60* 59*  63*  CREATININE 1.88* 1.97* 1.64* 1.90* 2.02*  CALCIUM 8.9 8.9 9.0 9.3 9.4  MG  --   --   --  2.0  --     Liver Function Tests: No results for input(s): AST, ALT, ALKPHOS, BILITOT, PROT, ALBUMIN in the last 168 hours. No results for input(s): LIPASE, AMYLASE in the last 168 hours. No results for input(s): AMMONIA in the last 168 hours.  CBC: Recent Labs  Lab 09/01/18 0924  09/01/18 1700 09/01/18 1835 09/02/18 1331 09/04/18 0623 09/05/18 1115  WBC 16.8*  --   --  18.3* 15.8* 16.5* 15.5*  NEUTROABS 10.2*  --   --   --   --   --   --   HGB 6.1*   < > 7.8* 6.1* 6.0* 8.1* 9.2*  HCT 17.5*   < > 23.0* 17.2* 17.0* 23.5* 26.1*  MCV 115.9*  --   --  113.2* 112.6* 104.9* 103.2*  PLT 247  --   --  256 259 233 243   < > = values in this interval not displayed.    Cardiac Enzymes: Recent Labs  Lab 08/31/18 0801 08/31/18 1312  TROPONINI 0.03* 0.03*    BNP: BNP (last 3 results) Recent Labs    09/02/18 1331 09/03/18 0459 09/04/18 0624  BNP 444.9* 467.0* 492.7*    ProBNP (last 3 results) No results for input(s): PROBNP in the last 8760 hours.    Other results:  Imaging: No results found.  Medications:     Scheduled Medications: . sodium chloride   Intravenous Once  . acetaZOLAMIDE  250 mg Oral BID  . enoxaparin (LOVENOX) injection  40 mg Subcutaneous QHS  . folic acid  1 mg Oral Daily  . magnesium oxide  400 mg Oral Daily  . sodium chloride flush  3 mL Intravenous Q12H    Infusions: . furosemide (LASIX) infusion 15 mg/hr (09/06/18 1015)    PRN Medications: ondansetron **OR** ondansetron (ZOFRAN) IV, sodium chloride flush   Assessment/Plan:   1. 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  - Main issue here is RV failure (PaPI < 1.0) in setting of longstanding PAH but her RV function looks ok on echo and Fick cardiac output  very high on cath so may also have a component of high-output failure.  - May also have possible peripheral shunting falsely elevating Fick so can consider echo with bubble - Volume status improving greatly on lasix gtt and diamox. Weight down to 179 and lowest previous baseline I see is 173 - Creatinine improved with slowing of lasix. Continue diuresis at least one more day - Will need to switch to toresmide 40 bid at d/c (was on lasix 40 bid on admit) - Will ned f/u in HF Clinic and Paramedicine at d/c - Continue UNNA boots  2. 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 AKI - suspect due to renal congestion but need to watch out for overly rapid diuresis. See plan as above  4. Hyperkalemia - due to AKI and potassium supplementation +/- hemolysis - resolved.  5. Sickle cell anemia - per priamry team. No evidence of crisis currently - received 2u RBCs on 6/21  6. Morbid obesity - needs weight loss  7. Severe OSA - continue CPAP. Has new machine at home  8. 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

## 2018-09-07 NOTE — Progress Notes (Signed)
Subjective: Pamela Hudson, a 33 year old female with a medical history significant for sickle cell disease, pulmonary hypertension, right heart failure, severe sleep apnea, and anemia of chronic disease was admitted for acute on chronic right heart failure.  Patient has been continued on rapid diuresis per cardiology.  Patient's weight is down 67 pounds overall.  Patient states that she is doing well and has been able to ambulate around room without assistance. Patient continues to wear CPAP at bedtime without complication. Patient is afebrile and maintaining oxygen saturation at 100% on RA. Patient denies headache, dizziness, chest pain, shortness of breath, abdominal pain, nausea, vomiting, or diarrhea. Objective:  Vital signs in last 24 hours:  Vitals:   09/06/18 0453 09/06/18 2018 09/07/18 0611 09/07/18 0613  BP: (!) 111/56 123/62  119/61  Pulse: (!) 113 (!) 109  (!) 101  Resp: 18 18  18   Temp: 99.1 F (37.3 C) 98.6 F (37 C)  98.8 F (37.1 C)  TempSrc: Oral Oral  Oral  SpO2: 96% 100%  100%  Weight: 87 kg  80.7 kg   Height:        Intake/Output from previous day:   Intake/Output Summary (Last 24 hours) at 09/07/2018 1304 Last data filed at 09/07/2018 0500 Gross per 24 hour  Intake 240 ml  Output 5900 ml  Net -5660 ml   Physical Exam Constitutional:      Appearance: She is obese.  HENT:     Head: Normocephalic.     Mouth/Throat:     Mouth: Mucous membranes are moist.     Pharynx: Oropharynx is clear.  Eyes:     General: Scleral icterus present.  Cardiovascular:     Rate and Rhythm: Normal rate and regular rhythm.     Pulses: Normal pulses.     Heart sounds: Murmur present.  Pulmonary:     Effort: Pulmonary effort is normal.     Breath sounds: Normal breath sounds.  Abdominal:     General: Abdomen is flat. Bowel sounds are normal.  Skin:    General: Skin is warm and dry.  Neurological:     General: No focal deficit present.     Mental Status: Mental status  is at baseline.     Lab Results:  Basic Metabolic Panel:    Component Value Date/Time   NA 131 (L) 09/07/2018 0623   K 3.7 09/07/2018 0623   CL 88 (L) 09/07/2018 0623   CO2 31 09/07/2018 0623   BUN 72 (H) 09/07/2018 0623   CREATININE 1.91 (H) 09/07/2018 0623   GLUCOSE 147 (H) 09/07/2018 0623   CALCIUM 9.1 09/07/2018 0623   CALCIUM 8.4 (L) 03/25/2018 0457   CBC:    Component Value Date/Time   WBC 15.5 (H) 09/05/2018 1115   HGB 9.2 (L) 09/05/2018 1115   HCT 26.1 (L) 09/05/2018 1115   PLT 243 09/05/2018 1115   MCV 103.2 (H) 09/05/2018 1115   NEUTROABS 10.2 (H) 09/01/2018 0924   LYMPHSABS 3.5 09/01/2018 0924   MONOABS 2.3 (H) 09/01/2018 0924   EOSABS 0.3 09/01/2018 0924   BASOSABS 0.1 09/01/2018 0924    Recent Results (from the past 240 hour(s))  SARS Coronavirus 2 (Hosp order,Performed in [facility] lab via [employer] ID)     Status: None   Collection Time: 08/30/18  6:19 PM   Specimen: Dry Nasal Swab ([employer] ID Now)  Result Value Ref Range Status   SARS Coronavirus 2 ([employer] ID Now) NEGATIVE NEGATIVE Final  Comment: (NOTE) Interpretive Result Comment(s): COVID 19 Positive SARS CoV 2 target nucleic acids are DETECTED. The SARS CoV 2 RNA is generally detectable in upper and lower respiratory specimens during the acute phase of infection.  Positive results are indicative of active infection with SARS CoV 2.  Clinical correlation with patient history and other diagnostic information is necessary to determine patient infection status.  Positive results do not rule out bacterial infection or coinfection with other viruses. The expected result is Negative. COVID 19 Negative SARS CoV 2 target nucleic acids are NOT DETECTED. The SARS CoV 2 RNA is generally detectable in upper and lower respiratory specimens during the acute phase of infection.  Negative results do not preclude SARS CoV 2 infection, do not rule out coinfections with other pathogens, and should not be used  as the sole basis for treatment or other patient management decisions.  Negative results must be combined with clinical  observations, patient history, and epidemiological information. The expected result is Negative. Invalid Presence or absence of SARS CoV 2 nucleic acids cannot be determined. Repeat testing was performed on the submitted specimen and repeated Invalid results were obtained.  If clinically indicated, additional testing on a new specimen with an alternate test methodology [phone]([id number]) is advised.  The SARS CoV 2 RNA is generally detectable in upper and lower respiratory specimens during the acute phase of infection. The expected result is Negative. Fact Sheet for Patients:  http://www.graves-ford.org/https://www.fda.gov/media/136524/download Fact Sheet for Healthcare Providers: EnviroConcern.sihttps://www.fda.gov/media/136523/download This test is not yet approved or cleared by the [place] FDA and has been authorized for detection and/or diagnosis of SARS CoV 2 by FDA under an Emergency Use Authorization (EUA).  This EUA will remain in effect (meaning this test can be used) for the duration of the COVID19 d eclaration under Section 564(b)(1) of the Act, 21 U.S.C. section [id number] 3(b)(1), unless the authorization is terminated or revoked sooner. Performed at [facility] [place], [address] [place], [place] [address]   SARS Coronavirus 2 (CEPHEID- Performed in [facility] hospital lab), Hosp Order     Status: None   Collection Time: 08/31/18 12:13 AM   Specimen: Nasopharyngeal Swab  Result Value Ref Range Status   SARS Coronavirus 2 NEGATIVE NEGATIVE Final    Comment: (NOTE) If result is NEGATIVE SARS-CoV-2 target nucleic acids are NOT DETECTED. The SARS-CoV-2 RNA is generally detectable in upper and lower  respiratory specimens during the acute phase of infection. The lowest  concentration of SARS-CoV-2 viral copies this assay can detect is 250  copies / mL. A negative result does not  preclude SARS-CoV-2 infection  and should not be used as the sole basis for treatment or other  patient management decisions.  A negative result may occur with  improper specimen collection / handling, submission of specimen other  than nasopharyngeal swab, presence of viral mutation(s) within the  areas targeted by this assay, and inadequate number of viral copies  (<250 copies / mL). A negative result must be combined with clinical  observations, patient history, and epidemiological information. If result is POSITIVE SARS-CoV-2 target nucleic acids are DETECTED. The SARS-CoV-2 RNA is generally detectable in upper and lower  respiratory specimens dur ing the acute phase of infection.  Positive  results are indicative of active infection with SARS-CoV-2.  Clinical  correlation with patient history and other diagnostic information is  necessary to determine patient infection status.  Positive results do  not rule out bacterial infection or co-infection with other  viruses. If result is PRESUMPTIVE POSTIVE SARS-CoV-2 nucleic acids MAY BE PRESENT.   A presumptive positive result was obtained on the submitted specimen  and confirmed on repeat testing.  While 2019 novel coronavirus  (SARS-CoV-2) nucleic acids may be present in the submitted sample  additional confirmatory testing may be necessary for epidemiological  and / or clinical management purposes  to differentiate between  SARS-CoV-2 and other Sarbecovirus currently known to infect humans.  If clinically indicated additional testing with an alternate test  methodology [phone]) is advised. The SARS-CoV-2 RNA is generally  detectable in upper and lower respiratory sp ecimens during the acute  phase of infection. The expected result is Negative. Fact Sheet for Patients:  StrictlyIdeas.no Fact Sheet for Healthcare Providers: BankingDealers.co.za This test is not yet approved or cleared by  the [place] FDA and has been authorized for detection and/or diagnosis of SARS-CoV-2 by FDA under an Emergency Use Authorization (EUA).  This EUA will remain in effect (meaning this test can be used) for the duration of the COVID-19 declaration under Section 564(b)(1) of the Act, 21 U.S.C. section 360bbb-3(b)(1), unless the authorization is terminated or revoked sooner. Performed at [facility], [place] [address] [place], [place] [address]     Studies/Results: No results found.  Medications: Scheduled Meds: . sodium chloride   Intravenous Once  . acetaZOLAMIDE  250 mg Oral BID  . enoxaparin (LOVENOX) injection  40 mg Subcutaneous QHS  . folic acid  1 mg Oral Daily  . magnesium oxide  400 mg Oral Daily  . sodium chloride flush  3 mL Intravenous Q12H   Continuous Infusions: . furosemide (LASIX) infusion 15 mg/hr (09/06/18 1015)   PRN Meds:.ondansetron **OR** ondansetron (ZOFRAN) IV, sodium chloride flush  Consultants:  Cardiology  Procedures:  S/P right heart cath  Assessment/Plan: Active Problems:   Sickle cell anemia (HCC)   Elevated bilirubin   Hyponatremia   Leukocytosis   AKI (acute kidney injury) ([place])   Dyspnea   Essential hypertension   Anemia   Acute on chronic right-sided heart failure (HCC)   Elevated troponin   Other ascites  Right heart failure: Patient continues diuresis per cardiology.  Medications were adjusted on 09/06/2018 due to elevation in creatinine.  Furosemide drip continued  Also, Diamox 250 mg twice daily. Patient's discharge plan is to transition to torsemide 40 mg twice daily at discharge.  Patient will also follow-up in heart failure clinic in [place]. Defer to cardiology for treatment plan   Acute kidney injury: Creatinine improved with decrease in furosemide. Continue to monitor closely BMP in a.m.  Hyper kalemia: Resolved.  Continue to monitor.  Sickle cell disease: No evidence of acute  sickle cell crisis. Hemoglobin stable.  Continue to monitor CBC closely Repeat in a.m.  Severe OSA: Continue CPAP at bedtime  Hypertension: Stable.  Continue to monitor closely  Hypothyroidism: Stable.  Continue levothyroxine  Code Status: Full Code Family Communication: N/A Disposition Plan: Not yet ready for discharge.  Discharge planned for 09/08/2018  Donia Pounds  APRN, MSN, FNP-C [facility] [place],  [address] [phone]   If 5PM-7AM, please contact night-coverage.  09/07/2018, 1:04 PM  LOS: 7 days

## 2018-09-08 ENCOUNTER — Telehealth (HOSPITAL_COMMUNITY): Payer: Self-pay

## 2018-09-08 DIAGNOSIS — D72829 Elevated white blood cell count, unspecified: Secondary | ICD-10-CM

## 2018-09-08 DIAGNOSIS — E871 Hypo-osmolality and hyponatremia: Secondary | ICD-10-CM

## 2018-09-08 LAB — MAGNESIUM: Magnesium: 2.2 mg/dL (ref 1.7–2.4)

## 2018-09-08 LAB — BASIC METABOLIC PANEL
Anion gap: 13 (ref 5–15)
BUN: 75 mg/dL — ABNORMAL HIGH (ref 6–20)
CO2: 31 mmol/L (ref 22–32)
Calcium: 9.5 mg/dL (ref 8.9–10.3)
Chloride: 87 mmol/L — ABNORMAL LOW (ref 98–111)
Creatinine, Ser: 1.9 mg/dL — ABNORMAL HIGH (ref 0.44–1.00)
GFR calc Af Amer: 40 mL/min — ABNORMAL LOW (ref >60)
GFR calc non Af Amer: 35 mL/min — ABNORMAL LOW (ref >60)
Glucose, Bld: 127 mg/dL — ABNORMAL HIGH (ref 70–99)
Potassium: 3.3 mmol/L — ABNORMAL LOW (ref 3.5–5.1)
Sodium: 131 mmol/L — ABNORMAL LOW (ref 135–145)

## 2018-09-08 LAB — IRON AND TIBC
Iron: 157 ug/dL (ref 28–170)
Saturation Ratios: 98 % — ABNORMAL HIGH (ref 10.4–31.8)
TIBC: 161 ug/dL — ABNORMAL LOW (ref 250–450)
UIBC: 4 ug/dL

## 2018-09-08 LAB — COOXEMETRY PANEL
Carboxyhemoglobin: 5.1 % (ref 0.5–1.5)
Methemoglobin: 1.9 % — ABNORMAL HIGH (ref 0.0–1.5)
O2 Saturation: 73.4 %
Total hemoglobin: 9.2 g/dL — ABNORMAL LOW (ref 12.0–16.0)

## 2018-09-08 LAB — CREATININE, SERUM
Creatinine, Ser: 1.82 mg/dL — ABNORMAL HIGH (ref 0.44–1.00)
GFR calc Af Amer: 42 mL/min — ABNORMAL LOW (ref >60)
GFR calc non Af Amer: 36 mL/min — ABNORMAL LOW (ref >60)

## 2018-09-08 MED ORDER — TORSEMIDE 20 MG PO TABS
40.0000 mg | ORAL_TABLET | Freq: Two times a day (BID) | ORAL | Status: DC
  Filled 2018-09-08: qty 2

## 2018-09-08 NOTE — Discharge Instructions (Signed)
Follow-up with cardiology as scheduled. We will transition to torsemide as opposed to Lasix per cardiology. Follow-up with me at [facility] in 2 weeks for post hospital care  Continue diet per cardiology.  Avoid sodium.  Limit fluid intake.  Avoid elevated temperatures to prevent dehydration.   Heart Failure Eating Plan Heart failure, also called congestive heart failure, occurs when your heart does not pump blood well enough to meet your body's needs for oxygen-rich blood. Heart failure is a long-term (chronic) condition. Living with heart failure can be challenging. However, following your health care provider's instructions about a healthy lifestyle and working with a diet and nutrition specialist (dietitian) to choose the right foods may help to improve your symptoms. What are tips for following this plan? General guidelines  Do not eat more than 2,300 mg of salt (sodium) a day. The amount of sodium that is recommended for you may be lower, depending on your condition.  Maintain a healthy body weight as directed. Ask your health care provider what a healthy weight is for you. ? Check your weight every day. ? Work with your health care provider and dietitian to make a plan that is right for you to lose weight or maintain your current weight.  Limit how much fluid you drink. Ask your health care provider or dietitian how much fluid you can have each day.  Limit or avoid alcohol as told by your health care provider or dietitian. Reading food labels  Check food labels for the amount of sodium per serving. Choose foods that have less than 140 mg (milligrams) of sodium in each serving.  Check food labels for the number of calories per serving. This is important if you need to limit your daily calorie intake to lose weight.  Check food labels for the serving size. If you eat more than one serving, you will be eating more sodium and calories than what is listed on the  label.  Look for foods that are labeled as "sodium-free," "very low sodium," or "low sodium." ? Foods labeled as "reduced sodium" or "lightly salted" may still have more sodium than what is recommended for you. Cooking  Avoid adding salt when cooking. Ask your health care provider or dietitian before using salt substitutes.  Season food with salt-free seasonings, spices, or herbs. Check the label of seasoning mixes to make sure they do not contain salt.  Cook with heart-healthy oils, such as olive, canola, soybean, or sunflower oil.  Do not fry foods. Cook foods using low-fat methods, such as baking, boiling, grilling, and broiling.  Limit unhealthy fats when cooking by: ? Removing the skin from poultry, such as chicken. ? Removing all visible fats from meats. ? Skimming the fat off from stews, soups, and gravies before serving them. Meal planning   Limit your intake of: ? Processed, canned, or pre-packaged foods. ? Foods that are high in trans fat, such as fried foods. ? Sweets, desserts, sugary drinks, and other foods with added sugar. ? Full-fat dairy products, such as whole milk.  Eat a balanced diet that includes: ? 4-5 servings of fruit each day and 4-5 servings of vegetables each day. At each meal, try to fill half of your plate with fruits and vegetables. ? Up to 6-8 servings of whole grains each day. ? Up to 2 servings of lean meat, poultry, or fish each day. One serving of meat is equal to 3 oz. This is about the same size as a  deck of cards. ? 2 servings of low-fat dairy each day. ? Heart-healthy fats. Healthy fats called omega-3 fatty acids are found in foods such as flaxseed and cold-water fish like sardines, salmon, and mackerel.  Aim to eat 25-35 g (grams) of fiber a day. Foods that are high in fiber include apples, broccoli, carrots, beans, peas, and whole grains.  Do not add salt or condiments that contain salt (such as soy sauce) to foods before eating.  When  eating at a restaurant, ask that your food be prepared with less salt or no salt, if possible.  Try to eat 2 or more vegetarian meals each week.  Eat more home-cooked food and eat less restaurant, buffet, and fast food. Recommended foods The items listed may not be a complete list. Talk with your dietitian about what dietary choices are best for you. Grains Bread with less than 80 mg of sodium per slice. Whole-wheat pasta, quinoa, and brown rice. Oats and oatmeal. Barley. [place]. Grits and cream of wheat. Whole-grain and whole-wheat cold cereal. Vegetables All fresh vegetables. Vegetables that are frozen without sauce or added salt. Low-sodium or sodium-free canned vegetables. Fruits All fresh, frozen, and canned fruits. Dried fruits, such as raisins, prunes, and cranberries. Meats and other protein foods Lean cuts of meat. Skinless chicken and [place]. Fish with high omega-3 fatty acids, such as salmon, sardines, and other cold-water fishes. Eggs. Dried beans, peas, and edamame. Unsalted nuts and nut butters. Dairy Low-fat or nonfat (skim) milk and dried milk. Rice milk, soy milk, and almond milk. Low-fat or nonfat yogurt. Small amounts of reduced-sodium block cheese. Low-sodium cottage cheese. Fats and oils Olive, canola, soybean, flaxseed, or sunflower oil. Avocado. Sweets and desserts Apple sauce. Granola bars. Sugar-free pudding and gelatin. Frozen fruit bars. Seasoning and other foods Fresh and dried herbs. Lemon or lime juice. Vinegar. Low-sodium ketchup. Salt-free marinades, salad dressings, sauces, and seasonings. Foods to avoid The items listed may not be a complete list. Talk with your dietitian about what dietary choices are best for you. Grains Bread with more than 80 mg of sodium per slice. Hot or cold cereal with more than 140 mg sodium per serving. Salted pretzels and crackers. Pre-packaged breadcrumbs. Bagels, croissants, and biscuits. Vegetables Canned vegetables. Frozen  vegetables with sauce or seasonings. Creamed vegetables. [place] fries. Onion rings. Pickled vegetables and sauerkraut. Fruits Fruits that are dried with sodium-containing preservatives. Meats and other protein foods Ribs and chicken wings. Bacon, ham, pepperoni, bologna, salami, and packaged luncheon meats. Hot dogs, bratwurst, and sausage. Canned meat. Smoked meat and fish. Salted nuts and seeds. Dairy Whole milk, half-and-half, and cream. Buttermilk. Processed cheese, cheese spreads, and cheese curds. Regular cottage cheese. Feta cheese. Shredded cheese. String cheese. Fats and oils Butter, lard, shortening, ghee, and bacon fat. Canned and packaged gravies. Seasoning and other foods Onion salt, garlic salt, table salt, and sea salt. Marinades. Regular salad dressings. Relishes, pickles, and olives. Meat flavorings and tenderizers, and bouillon cubes. Horseradish, ketchup, and mustard. Worcestershire sauce. Teriyaki sauce, soy sauce (including reduced sodium). Hot sauce and Tabasco sauce. Steak sauce, fish sauce, oyster sauce, and cocktail sauce. Taco seasonings. Barbecue sauce. Tartar sauce. Summary  A heart failure eating plan includes changes that limit your intake of sodium and unhealthy fat, and it may help you lose weight or maintain a healthy weight. Your health care provider may also recommend limiting how much fluid you drink.  Most people with heart failure should eat no more than 2,300 mg of salt (  sodium) a day. The amount of sodium that is recommended for you may be lower, depending on your condition.  Contact your health care provider or dietitian before making any major changes to your diet. This information is not intended to replace advice given to you by your health care provider. Make sure you discuss any questions you have with your health care provider. Document Released: 07/16/2016 Document Revised: 07/16/2016 Document Reviewed: 07/16/2016 Elsevier Interactive Patient  Education  2019 Elsevier Inc.  Heart Failure Action Plan A heart failure action plan helps you understand what to do when you have symptoms of heart failure. Follow the plan that was created by you and your health care provider. Review your plan each time you visit your health care provider. Red zone These signs and symptoms mean you should get medical help right away:  You have trouble breathing when resting.  You have a dry cough that is getting worse.  You have swelling or pain in your legs or abdomen that is getting worse.  You suddenly gain more than 2-3 lb (0.9-1.4 kg) in a day, or more than 5 lb (2.3 kg) in one week. This amount may be more or less depending on your condition.  You have trouble staying awake or you feel confused.  You have chest pain.  You do not have an appetite.  You pass out. If you experience any of these symptoms:  Call your local emergency services (911 in the [place] right away or seek help at the emergency department of the nearest hospital. Yellow zone These signs and symptoms mean your condition may be getting worse and you should make some changes:  You have trouble breathing when you are active or you need to sleep with extra pillows.  You have swelling in your legs or abdomen.  You gain 2-3 lb (0.9-1.4 kg) in one day, or 5 lb (2.3 kg) in one week. This amount may be more or less depending on your condition.  You get tired easily.  You have trouble sleeping.  You have a dry cough. If you experience any of these symptoms:  Contact your health care provider within the next day.  Your health care provider may adjust your medicines. Green zone These signs mean you are doing well and can continue what you are doing:  You do not have shortness of breath.  You have very little swelling or no new swelling.  Your weight is stable (no gain or loss).  You have a normal activity level.  You do not have chest pain or any other new  symptoms. Follow these instructions at home:  Take over-the-counter and prescription medicines only as told by your health care provider.  Weigh yourself daily. Your target weight is __________ lb (__________ kg). ? Call your health care provider if you gain more than __________ lb (__________ kg) in a day, or more than __________ lb (__________ kg) in one week.  Eat a heart-healthy diet. Work with a diet and nutrition specialist (dietitian) to create an eating plan that is best for you.  Keep all follow-up visits as told by your health care provider. This is important. Where to find more information  American Heart Association: www.heart.org Summary  Follow the action plan that was created by you and your health care provider.  Get help right away if you have any symptoms in the Red zone. This information is not intended to replace advice given to you by your health care provider. Make sure you  discuss any questions you have with your health care provider. Document Released: 04/10/2016 Document Revised: 11/03/2016 Document Reviewed: 04/10/2016 Elsevier Interactive Patient Education  2019 [employer]

## 2018-09-08 NOTE — Progress Notes (Signed)
RT placed pt on CPAP dream station for the night on auto titrate no O2 bled in. Pt respiratory status stable at this time. RT will continue to monitor.

## 2018-09-08 NOTE — Telephone Encounter (Signed)
I called Pamela Hudson to schedule CHP visit for next week.  She agrees to meet Thursday around 10 am.

## 2018-09-08 NOTE — Progress Notes (Addendum)
Advanced Heart Failure Rounding Note   Subjective:   Diuresing with lasix drip and diamox. Weight down another 8 pounds. Overall down 75 pounds!  Feels much better. Wants to go home today. Denies SOB    Objective:   Weight Range:  Vital Signs:   Temp:  [98.3 F (36.8 C)-98.5 F (36.9 C)] 98.3 F (36.8 C) (06/26 0500) Pulse Rate:  [91-108] 98 (06/26 0500) Resp:  [18-20] 18 (06/26 0500) BP: (108-121)/(59-68) 108/68 (06/26 0500) SpO2:  [94 %-99 %] 98 % (06/26 0500) FiO2 (%):  [21 %] 21 % (06/26 0143) Weight:  [77.4 kg] 77.4 kg (06/26 0500) Last BM Date: 09/05/18  Weight change: Filed Weights   09/06/18 0453 09/07/18 0611 09/08/18 0500  Weight: 87 kg 80.7 kg 77.4 kg    Intake/Output:   Intake/Output Summary (Last 24 hours) at 09/08/2018 1213 Last data filed at 09/08/2018 0930 Gross per 24 hour  Intake 1080 ml  Output 5300 ml  Net -4220 ml     Physical Exam: General:  No resp difficulty. Sitting in the chair HEENT: normal Neck: supple. 10-11. Carotids 2+ bilat; no bruits. No lymphadenopathy or thryomegaly appreciated. Cor: PMI nondisplaced. Regular rate & rhythm. No rubs, gallops or murmurs. RV lift  Lungs: clear Abdomen: soft, nontender, nondistended. No hepatosplenomegaly. No bruits or masses. Good bowel sounds. Extremities: no cyanosis, clubbing, rash, R and LLE unna boots. Still with some edema.  Neuro: alert & orientedx3, cranial nerves grossly intact. moves all 4 extremities w/o difficulty. Affect pleasant   Telemetry: ST 100s   Labs: Basic Metabolic Panel: Recent Labs  Lab 09/03/18 0459 09/04/18 0623 09/05/18 0528 09/06/18 0529 09/07/18 0623 09/08/18 0349  NA 131* 131* 131* 130* 131*  --   K 5.3* 4.6 4.3 3.9 3.7  --   CL 101 98 94* 88* 88*  --   CO2 21* 24 28 29 31   --   GLUCOSE 182* 209* 182* 226* 147*  --   BUN 64* 60* 59* 63* 72*  --   CREATININE 1.97* 1.64* 1.90* 2.02* 1.91* 1.82*  CALCIUM 8.9 9.0 9.3 9.4 9.1  --   MG  --   --  2.0  --    --   --     Liver Function Tests: No results for input(s): AST, ALT, ALKPHOS, BILITOT, PROT, ALBUMIN in the last 168 hours. No results for input(s): LIPASE, AMYLASE in the last 168 hours. No results for input(s): AMMONIA in the last 168 hours.  CBC: Recent Labs  Lab 09/01/18 1700 09/01/18 1835 09/02/18 1331 09/04/18 0623 09/05/18 1115  WBC  --  18.3* 15.8* 16.5* 15.5*  HGB 7.8* 6.1* 6.0* 8.1* 9.2*  HCT 23.0* 17.2* 17.0* 23.5* 26.1*  MCV  --  113.2* 112.6* 104.9* 103.2*  PLT  --  256 259 233 243    Cardiac Enzymes: No results for input(s): CKTOTAL, CKMB, CKMBINDEX, TROPONINI in the last 168 hours.  BNP: BNP (last 3 results) Recent Labs    09/02/18 1331 09/03/18 0459 09/04/18 0624  BNP 444.9* 467.0* 492.7*    ProBNP (last 3 results) No results for input(s): PROBNP in the last 8760 hours.    Other results:  Imaging: No results found.   Medications:     Scheduled Medications: . sodium chloride   Intravenous Once  . acetaZOLAMIDE  250 mg Oral BID  . enoxaparin (LOVENOX) injection  40 mg Subcutaneous QHS  . folic acid  1 mg Oral Daily  . magnesium oxide  400 mg Oral Daily  . sodium chloride flush  3 mL Intravenous Q12H    Infusions: . furosemide (LASIX) infusion 15 mg/hr (09/07/18 1627)    PRN Medications: ondansetron **OR** ondansetron (ZOFRAN) IV, sodium chloride flush   Assessment/Plan:   1. 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  - Main issue here is RV failure (PaPI < 1.0) in setting of longstanding PAH but her RV function looks ok on echo and Fick cardiac output very high on cath so may also have a component of high-output failure.  - May also have possible peripheral shunting falsely elevating Fick so can consider echo with bubble - Volume status improving greatly on lasix gtt and diamox. Weight now down to  170 pounds. She wants to go home today.  - Stop lasix drip and diamox. Start torsemide 40 mg twice a day.   Creatinine trending down 2>1.9>1.8 -Remove unna boots and place ted hose.   2. 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 AKI - suspect due to renal congestion but need to watch out for overly rapid diuresis. See plan as above - Renal function improving.   4. Hyperkalemia - due to AKI and potassium supplementation +/- hemolysis - Check potassium now.   5. Sickle cell anemia - per priamry team. No evidence of crisis currently - received 2u RBCs on 6/21 - Iron sat low.   6. Morbid obesity - needs weight loss  7. Severe OSA - continue CPAP. Has new machine at home  8. 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  D/C on torsemide 40 mg twice a day.    Length of Stay: [place] NP_C  09/08/2018, 12:13 PM  Advanced Heart Failure Team Pager [phone] (M-F; [place])  Please contact [facility] for night-coverage after hours (4p -7a ) and weekends on [email or web address]  Patient seen with NP, agree with the above note.  She has moderate pulmonary hypertension with severe TR and significant RV failureby PaPI.  There also is likely a component of high output HF.  She has diuresed very well.  Creatinine is fairly stable today.  She still has volume overload with JVD and 1+ ankle edema.  However, she is quite insistent about going home today.  Therefore, we will transition her to torsemide 40 mg bid for home (she was on 40 mg daily prior to admission) and will get close followup in CHF clinic.   Loralie Champagne 09/08/2018 2:43 PM

## 2018-09-08 NOTE — Progress Notes (Signed)
Patient about to be discharged. Clarifying if Magnesium level still needs to be done, per Cammie Sickle NP there is no need to draw.

## 2018-09-08 NOTE — Discharge Summary (Signed)
Physician Discharge Summary  Cristina GongDevon Dougher ZOX:[id number] DOB: July 10, 1985 DOA: 08/30/2018  PCP: Judd LienWebb, Meghan H, PA-C  Admit date: 08/30/2018  Discharge date: 09/08/2018  Discharge Diagnoses:  Active Problems:   Sickle cell anemia (HCC)   Elevated bilirubin   Hyponatremia   Leukocytosis   AKI (acute kidney injury) (HCC)   Dyspnea   Essential hypertension   Anemia   Acute on chronic right-sided heart failure (HCC)   Elevated troponin   Other ascites   Discharge Condition: Stable  Disposition:  Follow-up Information    [facility]. Go on 09/21/2018.   Specialty: Cardiology Why: 10:30 AM, parking code [phone] Contact information: [address] [id number] [address]c [address] [id number] [phone]       Irving CopasWebb, Meghan H, PA-C Follow up.   Specialty: Physician Assistant Contact information: [address] [place] [id number] [phone]        Massie MaroonHollis, Zyia Kaneko M, FNP Follow up.   Specialty: Family Medicine Contact information: [address] [place] [address] [phone]          Pt is discharged home in good condition and is to follow up with Judd LienWebb, Meghan H, PA-C this week to have labs evaluated. Amilyah Galer is instructed to increase activity slowly and balance with rest for the next few days, and use prescribed medication to complete treatment of pain  Diet: Regular Wt Readings from Last 3 Encounters:  09/08/18 77.4 kg  07/12/18 95.3 kg  03/25/18 77.5 kg    History of present illness:  [employer]on Jarrett,   a 33 year old female with a medical history significant for sickle cell disease, hypothyroidism, pulmonary hypertension resulting in right-sided heart failure, and severe sleep apnea presented to ER with worsening lower extremity and exertional dyspnea.  Patient was last admitted in April 2020 with this problem.  She had a similar presentation of shortness of breath and  lower extremity edema.  At that time, patient was switched from furosemide to torsemide.  Patient states that she has been taking home medications without interruption and denied excessive salt use.  Patient also denies chest pains today.  Patient has had increased weight gain over the past several months.  She also endorses shortness of breath and lower extremity swelling, right leg is somewhat worse than left.,  Patient also endorses abdominal girth and distention.  In December 2019, 2D echocardiogram showed preserved EF, abnormal septal wall motion defect and severe tricuspid regurgitation.  Also, moderate pulmonary hypertension.  [facility] showed trace mitral regurgitation, severe tricuspid regurgitation, mild concentric left ventricular hypertrophy, and EF estimated at 65 to 70%.  Patient has been [facility].  Patient also has a history of sickle cell disease and is [facility].  Patient's baseline hemoglobin is typically around 6.  She is not complaining of any pain on today.   ER course: Blood pressure 119/68, pulse 104, temperature 99.2, respirations 18.  Weight 110.7 kg.  Patient's oxygen saturation is 100% on RA. Hospitalist notified for admission for fluid overload in the setting of tricuspid regurgitation.  Patient admitted to telemetry. Hospital Course:  Patient admitted for acute on chronic right-sided heart failure.  Cardiology was consulted.  Patient transition [facility] following right heart cath.  Patient underwent echocardiogram on 08/30/2018.  Left ventricle had normal systolic function with an ejection fraction of 60-65%.  The cavity size was  normal.  There was mildly increased left ventricular wall thickness.  Left ventricular diastolic Doppler parameters are indeterminate.  No evidence of left ventricular regional wall motion abnormalities.  The right ventricle has normal systolic  function.  The cavity was mildly enlarged.  There is no increase in right ventricular wall thickness.  Left atrial size is mildly dilated.  Right atrial size is mildly dilated.  Trivial pericardial effusion is present. Patient was admitted for rapid diuresis.  Her symptoms were exacerbated by severe anemia.  Nadir of 5.8.  Patient was transfused 2 units of PRBCs.  Hemoglobin increased to 8-9.  Hemoglobin 9.2 prior to discharge.  Patient was treated with IV furosemide and Diamox.  Weight decreased to 170 pounds, patient lost a total of 75 pounds.  She is transition to torsemide 40 mg twice daily.  Patient is scheduled to follow-up in the heart failure clinic as scheduled.  She expressed understanding.  Patient will continue daily weights and sodium restriction.  Provided written information. Patient also has a history of pulmonary hypertension.  Moderate by RHC.  Patient is scheduled to see pulmonary [facility]. Anemia of chronic disease: .  Hemoglobin reached a nadir of 5.8.  Patient was transfused 2 units of PRBCs.  Patient is to follow-up with primary care provider and 1-2 weeks to repeat CBC and CMP.  Acute kidney injury: Patient experienced acute kidney injury as a result of rapid diuresis.  Creatinine prior to discharge was 1.8, which improved following the decrease and IV furosemide.  Patient will also follow-up in cardiology in 1 week as discussed.  Sickle cell disease: Patient did not complain of sickle cell pain throughout admission.  Pain is controlled by home medications.  Patient was gotten sickle cell crisis.  Patient advised to continue folic acid for bone marrow support  Patient was discharged home today in a hemodynamically stable condition.   Discharge Exam: Vitals:   09/07/18 2025 09/08/18 0500  BP: (!) 119/59 108/68  Pulse: (!) 108 98  Resp: 20 18  Temp: 98.5 F (36.9 C) 98.3 F (36.8 C)  SpO2: 99% 98%   Vitals:   09/07/18 0613 09/07/18  1514 09/07/18 2025 09/08/18 0500  BP: 119/61 121/66 (!) 119/59 108/68  Pulse: (!) 101 91 (!) 108 98  Resp: 18  20 18   Temp: 98.8 F (37.1 C) 98.4 F (36.9 C) 98.5 F (36.9 C) 98.3 F (36.8 C)  TempSrc: Oral Oral Oral Oral  SpO2: 100% 94% 99% 98%  Weight:    77.4 kg  Height:        General appearance : Awake, alert, not in any distress. Speech Clear. Not toxic looking HEENT: Atraumatic and Normocephalic, pupils equally reactive to light and accomodation Neck: Supple, no JVD. No cervical lymphadenopathy.  Chest: Good air entry bilaterally, no added sounds  CVS: S1 S2 regular, no murmurs.  Abdomen: Bowel sounds present, Non tender and not distended with no gaurding, rigidity or rebound. Extremities: B/L Lower Ext shows no edema, both legs are warm to touch Neurology: Awake alert, and oriented X 3, CN II-XII intact, Non focal Skin: No Rash  Discharge Instructions  Discharge Instructions    Discharge patient   Complete by: As directed    Discharge disposition: 01-Home or Self Care   Discharge patient date: 09/08/2018     Allergies as of 09/08/2018      Reactions   Percocet [oxycodone-acetaminophen] Swelling   Lip swelling      Medication List  TAKE these medications   folic acid 1 MG tablet Commonly known as: FOLVITE Take 1 mg by mouth daily.   furosemide 20 MG tablet Commonly known as: LASIX Take 2 tablets (40 mg total) by mouth 2 (two) times daily.   hydroxyurea 500 MG capsule Commonly known as: HYDREA Take 1,000 mg by mouth daily.   IMPLANON Shirley Inject 1 each into the skin once. Implanted spring 2019   Jadenu 360 MG Tabs Generic drug: Deferasirox Take 1,080 mg by mouth daily.   magnesium oxide 400 MG tablet Commonly known as: MAG-OX Take 400 mg by mouth daily.   metoprolol succinate 25 MG 24 hr tablet Commonly known as: Toprol XL Take 0.5 tablets (12.5 mg total) by mouth daily.   torsemide 20 MG tablet Commonly known as: DEMADEX Take 40 mg by mouth  daily.   Vitamin D (Ergocalciferol) 1.25 MG (50000 UT) Caps capsule Commonly known as: DRISDOL Take 100,000 Units by mouth every Sunday. 2 capsules       The results of significant diagnostics from this hospitalization (including imaging, microbiology, ancillary and laboratory) are listed below for reference.    Significant Diagnostic Studies: Dg Chest 2 View  Result Date: 09/01/2018 CLINICAL DATA:  Heart failure, chronic shortness of breath. History of CHF, CVA, cardiomegaly and hypertension. EXAM: CHEST - 2 VIEW COMPARISON:  Chest x-rays dated 08/30/2018, 07/09/2018 and 12/01/2017. FINDINGS: Marked cardiomegaly, stable. Mild central pulmonary vascular congestion without overt alveolar pulmonary edema. No confluent opacity to suggest a developing pneumonia. No pleural effusion or pneumothorax seen. RIGHT chest wall Port-A-Cath appears adequately positioned with tip at the level of the mid SVC. No acute or suspicious osseous finding. IMPRESSION: Mild CHF/volume overload. No overt alveolar pulmonary edema. No evidence of pneumonia. Electronically Signed   By: Bary Richard M.D.   On: 09/01/2018 12:14   Nm Pulmonary Perfusion  Result Date: 09/01/2018 CLINICAL DATA:  Shortness of breath. EXAM: NUCLEAR MEDICINE PERFUSION LUNG SCAN TECHNIQUE: Perfusion images were obtained in multiple projections after intravenous injection of radiopharmaceutical. Ventilation scans intentionally deferred if perfusion scan and chest x-ray adequate for interpretation during COVID 19 epidemic. RADIOPHARMACEUTICALS:  1.4 mCi Tc-57m MAA IV COMPARISON:  Same day chest x-ray. FINDINGS: No segmental perfusion defects identified in either lung. IMPRESSION: Normal lung perfusion study. Electronically Signed   By: Kennith Center M.D.   On: 09/01/2018 12:16   US Abdomen Complete  Result Date: 09/02/2018 CLINICAL DATA:  Possible cirrhosis and ascites, abdominal distension. Previous cholecystectomy. Sickle cell disease. EXAM:  ABDOMEN ULTRASOUND COMPLETE COMPARISON:  CT abdomen 12/26/2016 FINDINGS: Gallbladder: Previous cholecystectomy. Common bile duct: Diameter: 3.3 mm. Liver: Mildly enlarged measuring 21.2 cm in greatest dimension. No focal lesion identified. Within normal limits in parenchymal echogenicity. Portal vein is patent on color Doppler imaging with normal direction of blood flow towards the liver. IVC: No abnormality visualized. Pancreas: Not visualized. Spleen: Not visualized. Right Kidney: Length: 11.9 cm. Echogenicity within normal limits. No mass or hydronephrosis visualized. Left Kidney: Length: 11.4 cm. Echogenicity within normal limits. No mass or hydronephrosis visualized. Abdominal aorta: No aneurysm visualized. Other findings: No ascites. IMPRESSION: No acute findings. Mild hepatomegaly.  No ascites. Previous cholecystectomy. Pancreas and spleen not visualized. Electronically Signed   By: Elberta Fortis M.D.   On: 09/02/2018 16:11   Dg Chest Port 1 View  Result Date: 08/30/2018 CLINICAL DATA:  SOB EXAM: PORTABLE CHEST 1 VIEW COMPARISON:  08/08/2018 FINDINGS: There is no focal consolidation. There is no pleural effusion or pneumothorax. There is  mild bilateral interstitial prominence. There is stable cardiomegaly. There is a right-sided Port-A-Cath in satisfactory position. There is no acute osseous abnormality. IMPRESSION: Cardiomegaly with mild pulmonary vascular congestion. Electronically Signed   By: Elige KoHetal  Patel   On: 08/30/2018 19:00   Vas Koreas Lower Extremity Venous (dvt)  Result Date: 08/31/2018  Lower Venous Study Indications: Swelling.  Risk Factors: CHF. Limitations: Body habitus and swelling. Comparison Study: Prior negative study from 02/17/18 is available. Performing Technologist: Sherren Kernsandace Kanady RVS  Examination Guidelines: A complete evaluation includes B-mode imaging, spectral Doppler, color Doppler, and power Doppler as needed of all accessible portions of each vessel. Bilateral testing is  considered an integral part of a complete examination. Limited examinations for reoccurring indications may be performed as noted.  +---------+---------------+---------+-----------+----------+--------------+ RIGHT    CompressibilityPhasicitySpontaneityPropertiesSummary        +---------+---------------+---------+-----------+----------+--------------+ CFV                                                   pulsatile      +---------+---------------+---------+-----------+----------+--------------+ FV Prox                                               pulsatile      +---------+---------------+---------+-----------+----------+--------------+ FV Mid                                                pulsatile      +---------+---------------+---------+-----------+----------+--------------+ FV Distal                                             pulsatile      +---------+---------------+---------+-----------+----------+--------------+ PFV                                                   Not visualized +---------+---------------+---------+-----------+----------+--------------+ POP      Full                                         pulsatile      +---------+---------------+---------+-----------+----------+--------------+ PTV                                                   Not visualized +---------+---------------+---------+-----------+----------+--------------+ PERO                                                  Not visualized +---------+---------------+---------+-----------+----------+--------------+   Right Technical Findings: Not visualized segments include profunda, peroneal, posterior tibial veins.  +---------+---------------+---------+-----------+----------+--------------+ LEFT  CompressibilityPhasicitySpontaneityPropertiesSummary        +---------+---------------+---------+-----------+----------+--------------+ CFV                                                    pulsatile      +---------+---------------+---------+-----------+----------+--------------+ FV Prox                                               pulsatile      +---------+---------------+---------+-----------+----------+--------------+ FV Mid                                                pulsatile      +---------+---------------+---------+-----------+----------+--------------+ FV Distal                                             pulsatile      +---------+---------------+---------+-----------+----------+--------------+ PFV                                                   Not visualized +---------+---------------+---------+-----------+----------+--------------+ POP                                                   pulsatile      +---------+---------------+---------+-----------+----------+--------------+ PTV                                                   Not visualized +---------+---------------+---------+-----------+----------+--------------+ PERO                                                  Not visualized +---------+---------------+---------+-----------+----------+--------------+     Summary: Right: There is no evidence of deep vein thrombosis in the lower extremity. However, portions of this examination were limited- see technologist comments above. Left: There is no evidence of deep vein thrombosis in the lower extremity. However, portions of this examination were limited- see technologist comments above.  *See table(s) above for measurements and observations. Electronically signed by Servando Snare MD on 08/31/2018 at 4:10:52 PM.    Final    [place] Ekg Site Rite  Result Date: 09/02/2018 If Site Rite image not attached, placement could not be confirmed due to current cardiac rhythm.   Microbiology: Recent Results (from the past 240 hour(s))  SARS Coronavirus 2 (Hosp order,Performed in [facility] lab via Abbott ID)     Status: None    Collection Time: 08/30/18  6:19 PM   Specimen: Dry Nasal Swab (Abbott ID  Now)  Result Value Ref Range Status   SARS Coronavirus 2 (Abbott ID Now) NEGATIVE NEGATIVE Final    Comment: (NOTE) Interpretive Result Comment(s): COVID 19 Positive SARS CoV 2 target nucleic acids are DETECTED. The SARS CoV 2 RNA is generally detectable in upper and lower respiratory specimens during the acute phase of infection.  Positive results are indicative of active infection with SARS CoV 2.  Clinical correlation with patient history and other diagnostic information is necessary to determine patient infection status.  Positive results do not rule out bacterial infection or coinfection with other viruses. The expected result is Negative. COVID 19 Negative SARS CoV 2 target nucleic acids are NOT DETECTED. The SARS CoV 2 RNA is generally detectable in upper and lower respiratory specimens during the acute phase of infection.  Negative results do not preclude SARS CoV 2 infection, do not rule out coinfections with other pathogens, and should not be used as the sole basis for treatment or other patient management decisions.  Negative results must be combined with clinical  observations, patient history, and epidemiological information. The expected result is Negative. Invalid Presence or absence of SARS CoV 2 nucleic acids cannot be determined. Repeat testing was performed on the submitted specimen and repeated Invalid results were obtained.  If clinically indicated, additional testing on a new specimen with an alternate test methodology [phone]) is advised.  The SARS CoV 2 RNA is generally detectable in upper and lower respiratory specimens during the acute phase of infection. The expected result is Negative. Fact Sheet for Patients:  http://www.graves-ford.org/ Fact Sheet for Healthcare Providers: EnviroConcern.si This test is not yet approved or cleared by the  [place] FDA and has been authorized for detection and/or diagnosis of SARS CoV 2 by FDA under an Emergency Use Authorization (EUA).  This EUA will remain in effect (meaning this test can be used) for the duration of the COVID19 d eclaration under Section 564(b)(1) of the Act, 21 U.S.C. section [phone] 3(b)(1), unless the authorization is terminated or revoked sooner. Performed at [facility], [address] [place], [place] [address]   SARS Coronavirus 2 (CEPHEID- Performed in [facility] hospital lab), Hosp Order     Status: None   Collection Time: 08/31/18 12:13 AM   Specimen: Nasopharyngeal Swab  Result Value Ref Range Status   SARS Coronavirus 2 NEGATIVE NEGATIVE Final    Comment: (NOTE) If result is NEGATIVE SARS-CoV-2 target nucleic acids are NOT DETECTED. The SARS-CoV-2 RNA is generally detectable in upper and lower  respiratory specimens during the acute phase of infection. The lowest  concentration of SARS-CoV-2 viral copies this assay can detect is 250  copies / mL. A negative result does not preclude SARS-CoV-2 infection  and should not be used as the sole basis for treatment or other  patient management decisions.  A negative result may occur with  improper specimen collection / handling, submission of specimen other  than nasopharyngeal swab, presence of viral mutation(s) within the  areas targeted by this assay, and inadequate number of viral copies  (<250 copies / mL). A negative result must be combined with clinical  observations, patient history, and epidemiological information. If result is POSITIVE SARS-CoV-2 target nucleic acids are DETECTED. The SARS-CoV-2 RNA is generally detectable in upper and lower  respiratory specimens dur ing the acute phase of infection.  Positive  results are indicative of active infection with SARS-CoV-2.  Clinical  correlation with patient history and other diagnostic information is  necessary to determine patient  infection status.  Positive results do  not rule out bacterial infection or co-infection with other viruses. If result is PRESUMPTIVE POSTIVE SARS-CoV-2 nucleic acids MAY BE PRESENT.   A presumptive positive result was obtained on the submitted specimen  and confirmed on repeat testing.  While 2019 novel coronavirus  (SARS-CoV-2) nucleic acids may be present in the submitted sample  additional confirmatory testing may be necessary for epidemiological  and / or clinical management purposes  to differentiate between  SARS-CoV-2 and other Sarbecovirus currently known to infect humans.  If clinically indicated additional testing with an alternate test  methodology [phone]) is advised. The SARS-CoV-2 RNA is generally  detectable in upper and lower respiratory sp ecimens during the acute  phase of infection. The expected result is Negative. Fact Sheet for Patients:  BoilerBrush.com.cy Fact Sheet for Healthcare Providers: https://pope.com/ This test is not yet approved or cleared by the [place] FDA and has been authorized for detection and/or diagnosis of SARS-CoV-2 by FDA under an Emergency Use Authorization (EUA).  This EUA will remain in effect (meaning this test can be used) for the duration of the COVID-19 declaration under Section 564(b)(1) of the Act, 21 U.S.C. section 360bbb-3(b)(1), unless the authorization is terminated or revoked sooner. Performed at [facility], [address], [place], [place] [address]      Labs: Basic Metabolic Panel: Recent Labs  Lab 09/03/18 0459 09/04/18 1478 09/05/18 0528 09/06/18 0529 09/07/18 0623 09/08/18 0349  NA 131* 131* 131* 130* 131*  --   K 5.3* 4.6 4.3 3.9 3.7  --   CL 101 98 94* 88* 88*  --   CO2 21* --   GLUCOSE 182* 209* 182* 226* 147*  --   BUN 64* 60* 59* 63* 72*  --   CREATININE 1.97* 1.64* 1.90* 2.02* 1.91* 1.82*  CALCIUM 8.9 9.0 9.3 9.4  9.1  --   MG  --   --  2.0  --   --   --    Liver Function Tests: No results for input(s): AST, ALT, ALKPHOS, BILITOT, PROT, ALBUMIN in the last 168 hours. No results for input(s): LIPASE, AMYLASE in the last 168 hours. No results for input(s): AMMONIA in the last 168 hours. CBC: Recent Labs  Lab 09/01/18 1700 09/01/18 1835 09/02/18 1331 09/04/18 0623 09/05/18 1115  WBC  --  18.3* 15.8* 16.5* 15.5*  HGB 7.8* 6.1* 6.0* 8.1* 9.2*  HCT 23.0* 17.2* 17.0* 23.5* 26.1*  MCV  --  113.2* 112.6* 104.9* 103.2*  PLT  --  256 259 233 243   Cardiac Enzymes: No results for input(s): CKTOTAL, CKMB, CKMBINDEX, TROPONINI in the last 168 hours. BNP: Invalid input(s): POCBNP CBG: No results for input(s): GLUCAP in the last 168 hours.  Time coordinating discharge: 50 minutes  Signed: Nolon Nations  APRN, MSN, FNP-C Patient Care [facility] [address] [place], [place] [address] [phone] [facility] 09/08/2018, 1:31 PM   THIS NOTE WAS PREPARED USING DRAGON SOFTWARE.

## 2018-09-11 ENCOUNTER — Telehealth (HOSPITAL_COMMUNITY): Payer: Self-pay | Admitting: Cardiology

## 2018-09-11 MED ORDER — POTASSIUM CHLORIDE CRYS ER 20 MEQ PO TBCR: 20.0000 meq | EXTENDED_RELEASE_TABLET | Freq: Every day | ORAL | 3 refills | Status: DC

## 2018-09-11 NOTE — Telephone Encounter (Signed)
-----   Message from Conrad Rockcastle, NP sent at 09/10/2018  6:47 PM EDT ----- Please call K low. Start Kdur 20 meq daily.

## 2018-09-11 NOTE — Telephone Encounter (Signed)
Notes recorded by Kerry Dory, CMA on 09/11/2018 at 10:53 AM EDT  Pt aware and voiced understanding  ------   Notes recorded by Conrad Clearwater, NP on 09/10/2018 at 6:47 PM EDT  Please call K low. Start Kdur 20 meq daily.

## 2018-09-14 ENCOUNTER — Other Ambulatory Visit (HOSPITAL_COMMUNITY): Payer: Self-pay

## 2018-09-14 NOTE — Progress Notes (Signed)
Paramedicine Encounter    Patient ID: Pamela Hudson, female    DOB: 1986/01/14, 33 y.o.   MRN: [id number]    Patient Care Team: Joella Prince as PCP - General (Physician Assistant)  Patient Active Problem List   Diagnosis Date Noted  . Other ascites   . Elevated troponin 08/31/2018  . Acute on chronic right-sided heart failure ([place]) 08/30/2018  . Acute exacerbation of CHF (congestive heart failure) ([place]) 07/09/2018  . Anemia in other chronic diseases classified elsewhere 07/09/2018  . Hyperkalemia 03/25/2018  . Acute respiratory failure with hypoxia ([place]) 02/17/2018  . Essential hypertension 02/17/2018  . Anemia 02/17/2018  . Dyspnea 02/16/2018  . Gastroenteritis 05/28/2017  . Dehydration 05/28/2017  . AKI (acute kidney injury) ([place]) 05/28/2017  . Sickle cell anemia with crisis ([place]) 05/28/2017  . Hemorrhagic ovarian cyst 12/26/2016  . Hemorrhagic cyst of ovary 12/26/2016  . Pneumonia 11/26/2015  . Sickle cell anemia ([place]) 11/26/2015  . Elevated bilirubin 11/26/2015  . Serum calcium elevated 11/26/2015  . Hyponatremia 11/26/2015  . Leukocytosis 11/26/2015  . CVA (cerebral infarction) 02/13/2012    Current Outpatient Medications:  Marland Kitchen  Deferasirox (JADENU) 360 MG TABS, Take 1,080 mg by mouth daily. , Disp: , Rfl:  .  Etonogestrel (IMPLANON [place]), Inject 1 each into the skin once. Implanted spring 2019, Disp: , Rfl:  .  folic acid (FOLVITE) 1 MG tablet, Take 1 mg by mouth daily., Disp: , Rfl:  .  hydroxyurea (HYDREA) 500 MG capsule, Take 1,000 mg by mouth daily. , Disp: , Rfl:  .  magnesium oxide (MAG-OX) 400 MG tablet, Take 400 mg by mouth daily., Disp: , Rfl: 5 .  metoprolol succinate (TOPROL XL) 25 MG 24 hr tablet, Take 0.5 tablets (12.5 mg total) by mouth daily., Disp: 30 tablet, Rfl: 1 .  Vitamin D, Ergocalciferol, (DRISDOL) 1.25 MG (50000 UT) CAPS capsule, Take 100,000 Units by mouth every Sunday. 2 capsules, Disp: , Rfl: 11 .  furosemide (LASIX) 20 MG tablet, Take 2  tablets (40 mg total) by mouth 2 (two) times daily. (Patient not taking: Reported on 08/30/2018), Disp: 60 tablet, Rfl: 0 .  potassium chloride SA (K-DUR) 20 MEQ tablet, Take 1 tablet (20 mEq total) by mouth daily., Disp: 90 tablet, Rfl: 3 .  torsemide (DEMADEX) 20 MG tablet, Take 40 mg by mouth daily., Disp: , Rfl:  Allergies  Allergen Reactions  . Percocet [Oxycodone-Acetaminophen] Swelling    Lip swelling     Social History   Socioeconomic History  . Marital status: Single    Spouse name: Not on file  . Number of children: Not on file  . Years of education: Not on file  . Highest education level: Not on file  Occupational History  . Not on file  Social Needs  . Financial resource strain: Not on file  . Food insecurity    Worry: Not on file    Inability: Not on file  . Transportation needs    Medical: Not on file    Non-medical: Not on file  Tobacco Use  . Smoking status: Former Research scientist (life sciences)  . Smokeless tobacco: Never Used  Substance and Sexual Activity  . Alcohol use: Yes    Comment: occ  . Drug use: No  . Sexual activity: Yes    Birth control/protection: Implant  Lifestyle  . Physical activity    Days per week: Not on file    Minutes per session: Not on file  . Stress: Not on file  Relationships  . Social Musicianconnections    Talks on phone: Not on file    Gets together: Not on file    Attends religious service: Not on file    Active member of club or organization: Not on file    Attends meetings of clubs or organizations: Not on file    Relationship status: Not on file  . Intimate partner violence    Fear of current or ex partner: Not on file    Emotionally abused: Not on file    Physically abused: Not on file    Forced sexual activity: Not on file  Other Topics Concern  . Not on file  Social History Narrative  . Not on file    Physical Exam      Future Appointments  Date Time Provider Department Center  09/21/2018 10:30 AM [facility] PA/NP [facility] None      BP 98/60   Pulse 88   Temp 98 F (36.7 C)   Wt 161 lb (73 kg)   SpO2 98%   BMI 32.52 kg/m   Weight yesterday-160.8  INITIAL VISIT  ATF pt CAO x4 sitting on the couch with her 31 yr old son.  [facility]. I went over the details of the program and the role that we have with the [facility] heart failure team.  She informed me that CHF is new to her and she's trying to understand how to eliminate fluid retention. We discussed food and fluids, how to keep track of both.  She's taken all of her medications today. I went over her medications with her and put them in a pill box. She denies sob, chest pain and dizziness.  We talked about the zones of heart failure and when to call the [facility] failure clinic. The blood pressure recorded is normal for her per pt.   [facility] but she would like to stay with Dr. Leonard SchwartzB. She said that she feels comfortable with him because he listened to her and she feels better. Pt lives with her boyfriend and son. She works as a Advertising copywriterhousekeeper for now; she returns to work next week.  Her schedule is put out weekly; I will call her on tuesdays to schedule CHP visit.    She has both furosemide and torsemide on her medication chart.  I spoke with Victorino DikeJennifer @ [facility] clinic to verify which one should she take.  Candise BowensJen called back; Dr. B would like her to take torsemide. She's currently out of the potassium (hasn't picked it up yet). She understands how to take the potassium.  [facility]. Reather [facility]Kinsler [facility]. Emergency contact: Valinda Hoartuwannia albea [phone]   Medication ordered: potassium  Roston Grunewald, EMT Paramedic [phone] 09/14/2018    ACTION: Home visit completed

## 2018-09-19 ENCOUNTER — Telehealth (HOSPITAL_COMMUNITY): Payer: Self-pay

## 2018-09-19 NOTE — Telephone Encounter (Signed)
I called Pamela Hudson to remind her of her upcoming appointment with Dr. Missy Sabins Wednesday. I text the address, time and gate code.

## 2018-09-21 ENCOUNTER — Other Ambulatory Visit (HOSPITAL_COMMUNITY): Payer: Self-pay

## 2018-09-21 ENCOUNTER — Other Ambulatory Visit: Payer: Self-pay

## 2018-09-21 ENCOUNTER — Ambulatory Visit (HOSPITAL_COMMUNITY)
Admit: 2018-09-21 | Discharge: 2018-09-21 | Disposition: A | Discharge status: Home / Self Care | Payer: Medicaid Other | Source: Ambulatory Visit | Attending: Cardiology | Admitting: Cardiology

## 2018-09-21 VITALS — BP 122/62 | HR 88 | Wt 162.6 lb

## 2018-09-21 DIAGNOSIS — N183 Chronic kidney disease, stage 3 unspecified: Secondary | ICD-10-CM

## 2018-09-21 DIAGNOSIS — G8929 Other chronic pain: Secondary | ICD-10-CM | POA: Diagnosis not present

## 2018-09-21 DIAGNOSIS — I2721 Secondary pulmonary arterial hypertension: Secondary | ICD-10-CM

## 2018-09-21 DIAGNOSIS — Z9989 Dependence on other enabling machines and devices: Secondary | ICD-10-CM

## 2018-09-21 DIAGNOSIS — Z833 Family history of diabetes mellitus: Secondary | ICD-10-CM | POA: Diagnosis not present

## 2018-09-21 DIAGNOSIS — Z8249 Family history of ischemic heart disease and other diseases of the circulatory system: Secondary | ICD-10-CM | POA: Diagnosis not present

## 2018-09-21 DIAGNOSIS — E282 Polycystic ovarian syndrome: Secondary | ICD-10-CM | POA: Insufficient documentation

## 2018-09-21 DIAGNOSIS — Z87442 Personal history of urinary calculi: Secondary | ICD-10-CM | POA: Diagnosis not present

## 2018-09-21 DIAGNOSIS — Z8673 Personal history of transient ischemic attack (TIA), and cerebral infarction without residual deficits: Secondary | ICD-10-CM | POA: Diagnosis not present

## 2018-09-21 DIAGNOSIS — I130 Hypertensive heart and chronic kidney disease with heart failure and stage 1 through stage 4 chronic kidney disease, or unspecified chronic kidney disease: Secondary | ICD-10-CM | POA: Diagnosis not present

## 2018-09-21 DIAGNOSIS — I5032 Chronic diastolic (congestive) heart failure: Secondary | ICD-10-CM | POA: Insufficient documentation

## 2018-09-21 DIAGNOSIS — Z87891 Personal history of nicotine dependence: Secondary | ICD-10-CM | POA: Insufficient documentation

## 2018-09-21 DIAGNOSIS — G4733 Obstructive sleep apnea (adult) (pediatric): Secondary | ICD-10-CM | POA: Insufficient documentation

## 2018-09-21 DIAGNOSIS — D571 Sickle-cell disease without crisis: Secondary | ICD-10-CM | POA: Insufficient documentation

## 2018-09-21 DIAGNOSIS — Z79899 Other long term (current) drug therapy: Secondary | ICD-10-CM | POA: Diagnosis not present

## 2018-09-21 LAB — BASIC METABOLIC PANEL
Anion gap: 11 (ref 5–15)
BUN: 70 mg/dL — ABNORMAL HIGH (ref 6–20)
CO2: 21 mmol/L — ABNORMAL LOW (ref 22–32)
Calcium: 9.2 mg/dL (ref 8.9–10.3)
Chloride: 102 mmol/L (ref 98–111)
Creatinine, Ser: 1.67 mg/dL — ABNORMAL HIGH (ref 0.44–1.00)
GFR calc Af Amer: 47 mL/min — ABNORMAL LOW (ref >60)
GFR calc non Af Amer: 40 mL/min — ABNORMAL LOW (ref >60)
Glucose, Bld: 172 mg/dL — ABNORMAL HIGH (ref 70–99)
Potassium: 4.6 mmol/L (ref 3.5–5.1)
Sodium: 134 mmol/L — ABNORMAL LOW (ref 135–145)

## 2018-09-21 MED ORDER — TORSEMIDE 20 MG PO TABS: 40.0000 mg | ORAL_TABLET | Freq: Two times a day (BID) | ORAL | 6 refills | Status: DC

## 2018-09-21 NOTE — Patient Instructions (Signed)
It was great to see you today! No medication changes are needed at this time.  Labs today We will only contact you if something comes back abnormal or we need to make some changes. Otherwise no news is good news!  Your physician recommends that you schedule a follow-up appointment in: 3 months with Dr Haroldine Laws -if we have not reached out to you by Oct. 15th, please give our office a call to schedule a follow up  Do the following things EVERYDAY: 1) Weigh yourself in the morning before breakfast. Write it down and keep it in a log. 2) Take your medicines as prescribed 3) Eat low salt foods-Limit salt (sodium) to 2000 mg per day.  4) Stay as active as you can everyday 5) Limit all fluids for the day to less than 2 liters At the [facility], you and your health needs are our priority. As part of our continuing mission to provide you with exceptional heart care, we have created designated Provider Care Teams. These Care Teams include your primary Cardiologist (physician) and Advanced Practice Providers (APPs- Physician Assistants and Nurse Practitioners) who all work together to provide you with the care you need, when you need it.   You may see any of the following providers on your designated Care Team at your next follow up: Marland Kitchen Dr Glori Bickers . Dr Loralie Champagne . Darrick Grinder, NP

## 2018-09-21 NOTE — Progress Notes (Signed)
PCP: Pamela Hudson  Primary HF Cardiologist: Dr Pamela Hudson  Pulmonary: Dr Pamela Hudson   HPI: Pamela AlimentDevon [name] a 33 y.o.femalewith morbid obesity, sickle cell disease with related hemochromatosis, severe obstructive sleep apnea by sleep study on 08/14/2018, hypertension and diastolic/RV failure admitted on 6/18 with worsening dyspnea and volume overload.  She was recently admitted in April 2020 with a similar presentation with a weight gain from 173 to 223 over 2 months. She was diuresed and discharge weight was 210 pounds. She subsequently followed up with Pulmonary as well as [facility] [place].   She was volume overloaded at that visit with weight at 220. Lasix switched to torsemide  Echo 07/07/18 Showed LVEF 65-70% with mild LVH. Septum flat. Severe TR with moderate to severe PAH . Hgb 6.4 at the time RHC discussed but not felt necessary.   Also saw Pulmory and was found to have restrictive lung disease with ratio 88% FEV1 60% FVC 58% DLCO 60%. As well as severe OSA with AHI 61/ CT chest was unremarkable except for enlarged pulmonary arteries   [facility] [place] [facility] with worsening volume overload and [facility]  Denied missing medicines but does not appear complaint with dietary restrictions. Weight on admission was 245 pounds. Creatinine up 1.12 (07/12/18) - > 1.58 08/30/18.  She did not respond well to IV diuretics and creatinine rose to 1.75 so Cardiology was consulted. She was seen by Dr. Jacinto Hudson and taken for RHC. HF Team consulted and she was diuresed with lasix drip + diamox > 70 pounds. She was transitioned to torsemide 40 mg twice daily.   Today she presents for post hospital follow up. Overall feeling much better. Denies SOB/PND/Orthopnea. Able to walk 1 mile in 25 minutes. Appetite ok. No fever or chills. She has been using CPAP again. Weight at home has been stable. 159-161 pounds. Taking all medications.    Cardia  Testing   ECHO 08/31/2018 EF 60-65% RV normal , RA/LA mildly dilated, mod -severe TR  RHC on 09/01/18 RA 38 (with large v-waves) RV 50/31 PA 55/26 (39) PCWP 24 Ao 93% PA sat 66% RA sat 68% Fick 12.9/6.4 PVR = 1.16 WU PAPi = 0.76       ROS: All systems negative except as listed in HPI, PMH and Problem List.  SH:  Social History   Socioeconomic History  . Marital status: Single    Spouse name: Not on file  . Number of children: Not on file  . Years of education: Not on file  . Highest education level: Not on file  Occupational History  . Not on file  Social Needs  . Financial resource strain: Not on file  . Food insecurity    Worry: Not on file    Inability: Not on file  . Transportation needs    Medical: Not on file    Non-medical: Not on file  Tobacco Use  . Smoking status: Former Games developermoker  . Smokeless tobacco: Never Used  Substance and Sexual Activity  . Alcohol use: Yes    Comment: occ  . Drug use: No  . Sexual activity: Yes    Birth control/protection: Implant  Lifestyle  . Physical activity    Days per week: Not on file    Minutes per session: Not on file  . Stress: Not on file  Relationships  . Social connections    Talks on phone: Not on file  Gets together: Not on file    Attends religious service: Not on file    Active member of club or organization: Not on file    Attends meetings of clubs or organizations: Not on file    Relationship status: Not on file  . Intimate partner violence    Fear of current or ex partner: Not on file    Emotionally abused: Not on file    Physically abused: Not on file    Forced sexual activity: Not on file  Other Topics Concern  . Not on file  Social History Narrative  . Not on file    FH:  Family History  Problem Relation Age of Onset  . Diabetes Mellitus II Father   . CAD Father   . Diabetes Mellitus II Paternal Grandmother     Past Medical History:  Diagnosis Date  . Cardiomegaly   . CHF  (congestive heart failure) (HCC)   . Chronic pain   . CVA (cerebral infarction) 02/2002  . Hypertension   . Liver disease    ?iron  . PCOS (polycystic ovarian syndrome)   . Renal stone   . Sickle cell anemia (HCC)     Current Outpatient Medications  Medication Sig Dispense Refill  . Deferasirox (JADENU) 360 MG TABS Take 1,080 mg by mouth daily.     . Etonogestrel (IMPLANON [place]) Inject 1 each into the skin once. Implanted spring 2019    . folic acid (FOLVITE) 1 MG tablet Take 1 mg by mouth daily.    . hydroxyurea (HYDREA) 500 MG capsule Take 1,000 mg by mouth daily.     . magnesium oxide (MAG-OX) 400 MG tablet Take 400 mg by mouth daily.  5  . metoprolol succinate (TOPROL XL) 25 MG 24 hr tablet Take 0.5 tablets (12.5 mg total) by mouth daily. 30 tablet 1  . potassium chloride SA (K-DUR) 20 MEQ tablet Take 1 tablet (20 mEq total) by mouth daily. 90 tablet 3  . torsemide (DEMADEX) 20 MG tablet Take 40 mg by mouth 2 (two) times daily.    . Vitamin D, Ergocalciferol, (DRISDOL) 1.25 MG (50000 UT) CAPS capsule Take 100,000 Units by mouth every Sunday. 2 capsules  11   No current facility-administered medications for this encounter.     Vitals:   09/21/18 1107  BP: 122/62  Pulse: 88  SpO2: 100%  Weight: 73.8 kg (162 lb 9.6 oz)   Wt Readings from Last 3 Encounters:  09/21/18 72.6 kg (160 lb)  09/21/18 73.8 kg (162 lb 9.6 oz)  09/14/18 73 kg (161 lb)   PHYSICAL EXAM:  General:  Well appearing. No resp difficulty HEENT: normal Neck: supple. JVP 6-7. Carotids 2+ bilaterally; no bruits. No lymphadenopathy or thryomegaly appreciated. Cor: PMI normal. Regular rate & rhythm. No rubs, gallops. 2/6 TR  Lungs: clear Abdomen: soft, nontender, nondistended. No hepatosplenomegaly. No bruits or masses. Good bowel sounds. Extremities: no cyanosis, clubbing, rash, edema Neuro: alert & orientedx3, cranial nerves grossly intact. Moves all 4 extremities w/o difficulty. Affect pleasant.       ASSESSMENT & PLAN: 1. Chronic diastolic HF/RV failure -- She has moderate PAH with markedly elevated RA pressures in setting of severe TR.  - Main issue here is RV failure (PaPI <1.0) in setting of longstanding PAH but her RV function looks ok on echo and Fick cardiac output very high on cath so may also have a component of high-output failure.  - May also have possible peripheral shunting falsely elevating  Fick so can consider echo with bubble - NYHA II. Functional improvement. Continue torsemide 40 mg twice a day.  - Check BMET today.   2. PAH - moderate by RHC - multifactorial due to WHO Group II, III, V and high-output physiology  - VQ negative - PFTS with DLCO 36% c/w severe pulmonary arteriopathy - Continue CPAP  3. CKD Stage III Creatinine baseline 1.6-1.8  Check BMET now.  4.. Sickle cell anemia 5. Severe OSA - Encouraged to use CPAP every night.   Follow up in 3 months with Dr Haroldine Laws. Continue HF Paramedicine. Check BMET now.  Amy Clegg NP-C  3:52 PM

## 2018-09-21 NOTE — Progress Notes (Signed)
Paramedicine Encounter   Patient ID: Pamela Hudson , female,   DOB: October 23, 1985,33 y.o.,  MRN: [id number]   Met patient in clinic today with provider Amy.   She said that she has decreased the fried foods to once a week. [name] said that she's eating more baked foods.  She's monitoring how fluid intake also.  She denies sob, no chest pain and no dizziness today. She's using the CPAP but she would like to change the mask from nasal to full mask. She asked about salmon and eating foods high in potassium.  She can eat it just not every day.       Amy: Lung sounds clear; continue using CPAP; continue taking her meds; continue exercising.  No medication change.  Lab work done today.    Time spent with patient: [place] 09/21/2018   ACTION: Home visit completed

## 2018-09-27 ENCOUNTER — Other Ambulatory Visit (HOSPITAL_COMMUNITY): Payer: Self-pay

## 2018-09-27 NOTE — Progress Notes (Signed)
Paramedicine Encounter    Patient ID: Pamela Hudson, female    DOB: Jul 27, 1985, 33 y.o.   MRN: [id number]    Patient Care Team: Barrington EllisonWebb, Meghan H, PA-C as PCP - General (Physician Assistant)  Patient Active Problem List   Diagnosis Date Noted  . Other ascites   . Elevated troponin 08/31/2018  . Acute on chronic right-sided heart failure (HCC) 08/30/2018  . Acute exacerbation of CHF (congestive heart failure) (HCC) 07/09/2018  . Anemia in other chronic diseases classified elsewhere 07/09/2018  . Hyperkalemia 03/25/2018  . Acute respiratory failure with hypoxia (HCC) 02/17/2018  . Essential hypertension 02/17/2018  . Anemia 02/17/2018  . Dyspnea 02/16/2018  . Gastroenteritis 05/28/2017  . Dehydration 05/28/2017  . AKI (acute kidney injury) (HCC) 05/28/2017  . Sickle cell anemia with crisis (HCC) 05/28/2017  . Hemorrhagic ovarian cyst 12/26/2016  . Hemorrhagic cyst of ovary 12/26/2016  . Pneumonia 11/26/2015  . Sickle cell anemia (HCC) 11/26/2015  . Elevated bilirubin 11/26/2015  . Serum calcium elevated 11/26/2015  . Hyponatremia 11/26/2015  . Leukocytosis 11/26/2015  . CVA (cerebral infarction) 02/13/2012    Current Outpatient Medications:  Marland Kitchen.  Deferasirox (JADENU) 360 MG TABS, Take 1,080 mg by mouth daily. , Disp: , Rfl:  .  folic acid (FOLVITE) 1 MG tablet, Take 1 mg by mouth daily., Disp: , Rfl:  .  magnesium oxide (MAG-OX) 400 MG tablet, Take 400 mg by mouth daily., Disp: , Rfl: 5 .  metoprolol succinate (TOPROL XL) 25 MG 24 hr tablet, Take 0.5 tablets (12.5 mg total) by mouth daily., Disp: 30 tablet, Rfl: 1 .  potassium chloride SA (K-DUR) 20 MEQ tablet, Take 1 tablet (20 mEq total) by mouth daily., Disp: 90 tablet, Rfl: 3 .  torsemide (DEMADEX) 20 MG tablet, Take 2 tablets (40 mg total) by mouth 2 (two) times daily., Disp: 120 tablet, Rfl: 6 .  Etonogestrel (IMPLANON [place]), Inject 1 each into the skin once. Implanted spring 2019, Disp: , Rfl:  .  hydroxyurea (HYDREA) 500 MG  capsule, Take 1,000 mg by mouth daily. , Disp: , Rfl:  .  Vitamin D, Ergocalciferol, (DRISDOL) 1.25 MG (50000 UT) CAPS capsule, Take 100,000 Units by mouth every Sunday. 2 capsules, Disp: , Rfl: 11 Allergies  Allergen Reactions  . Percocet [Oxycodone-Acetaminophen] Swelling    Lip swelling     Social History   Socioeconomic History  . Marital status: Single    Spouse name: Not on file  . Number of children: Not on file  . Years of education: Not on file  . Highest education level: Not on file  Occupational History  . Not on file  Social Needs  . Financial resource strain: Not on file  . Food insecurity    Worry: Not on file    Inability: Not on file  . Transportation needs    Medical: Not on file    Non-medical: Not on file  Tobacco Use  . Smoking status: Former Games developermoker  . Smokeless tobacco: Never Used  Substance and Sexual Activity  . Alcohol use: Yes    Comment: occ  . Drug use: No  . Sexual activity: Yes    Birth control/protection: Implant  Lifestyle  . Physical activity    Days per week: Not on file    Minutes per session: Not on file  . Stress: Not on file  Relationships  . Social Musicianconnections    Talks on phone: Not on file    Gets together: Not on file  Attends religious service: Not on file    Active member of club or organization: Not on file    Attends meetings of clubs or organizations: Not on file    Relationship status: Not on file  . Intimate partner violence    Fear of current or ex partner: Not on file    Emotionally abused: Not on file    Physically abused: Not on file    Forced sexual activity: Not on file  Other Topics Concern  . Not on file  Social History Narrative  . Not on file    Physical Exam Pulmonary:     Effort: No respiratory distress.     Breath sounds: No wheezing or rales.  Abdominal:     General: There is no distension.  Musculoskeletal:     Right lower leg: No edema.     Left lower leg: No edema.  Skin:     General: Skin is warm and dry.         No future appointments.   BP 110/64 (BP Location: Left Arm, Patient Position: Sitting, Cuff Size: Normal)   Pulse 88   Temp 98.5 F (36.9 C)   Resp 15   Wt 164 lb 9.6 oz (74.7 kg)   SpO2 98%   BMI 33.25 kg/m   Weight yesterday-161 Last visit weight-160  ATF pt CAO x4 CAO x4 cleaning around the house.  She said that she feels great;  She walked almost 2 miles yesterday around [employer]  [name] has taken all of her medications this week and refilled her on pill box prior to my arrival.  She denies increase in sob; no chest pain and no dizziness.  She has an upcoming appointment with her pcp later this week.  rx bottles and pill box verified.     Medication ordered: none  Marquee Fuchs, EMT Paramedic [phone] 09/27/2018    ACTION: Home visit completed

## 2018-10-20 ENCOUNTER — Telehealth (HOSPITAL_COMMUNITY): Payer: Self-pay

## 2018-10-20 NOTE — Telephone Encounter (Signed)
I called Pamela Hudson to set a CHP visit for today.  I left a message on her voice mail.

## 2018-10-24 ENCOUNTER — Telehealth (HOSPITAL_COMMUNITY): Payer: Self-pay

## 2018-10-24 NOTE — Telephone Encounter (Signed)
I called Mrs. Privott to set an appointment for [facility] visit this week. We agreed on Friday.

## 2018-10-27 ENCOUNTER — Other Ambulatory Visit (HOSPITAL_COMMUNITY): Payer: Self-pay

## 2018-10-27 NOTE — Progress Notes (Signed)
Paramedicine Encounter    Patient ID: Pamela Hudson, female    DOB: [birth year], 33 y.o.   MRN: [id number]    Patient Care Team: Barrington EllisonWebb, Meghan H, PA-C as PCP - General (Physician Assistant)  Patient Active Problem List   Diagnosis Date Noted  . Other ascites   . Elevated troponin 08/31/2018  . Acute on chronic right-sided heart failure (HCC) 08/30/2018  . Acute exacerbation of CHF (congestive heart failure) (HCC) 07/09/2018  . Anemia in other chronic diseases classified elsewhere 07/09/2018  . Hyperkalemia 03/25/2018  . Acute respiratory failure with hypoxia (HCC) 02/17/2018  . Essential hypertension 02/17/2018  . Anemia 02/17/2018  . Dyspnea 02/16/2018  . Gastroenteritis 05/28/2017  . Dehydration 05/28/2017  . AKI (acute kidney injury) (HCC) 05/28/2017  . Sickle cell anemia with crisis (HCC) 05/28/2017  . Hemorrhagic ovarian cyst 12/26/2016  . Hemorrhagic cyst of ovary 12/26/2016  . Pneumonia 11/26/2015  . Sickle cell anemia (HCC) 11/26/2015  . Elevated bilirubin 11/26/2015  . Serum calcium elevated 11/26/2015  . Hyponatremia 11/26/2015  . Leukocytosis 11/26/2015  . CVA (cerebral infarction) 02/13/2012    Current Outpatient Medications:  Marland Kitchen.  Deferasirox (JADENU) 360 MG TABS, Take 1,080 mg by mouth daily. , Disp: , Rfl:  .  Etonogestrel (IMPLANON [place]), Inject 1 each into the skin once. Implanted spring 2019, Disp: , Rfl:  .  folic acid (FOLVITE) 1 MG tablet, Take 1 mg by mouth daily., Disp: , Rfl:  .  hydroxyurea (HYDREA) 500 MG capsule, Take 1,000 mg by mouth daily. , Disp: , Rfl:  .  magnesium oxide (MAG-OX) 400 MG tablet, Take 400 mg by mouth daily., Disp: , Rfl: 5 .  metoprolol succinate (TOPROL XL) 25 MG 24 hr tablet, Take 0.5 tablets (12.5 mg total) by mouth daily., Disp: 30 tablet, Rfl: 1 .  potassium chloride SA (K-DUR) 20 MEQ tablet, Take 1 tablet (20 mEq total) by mouth daily., Disp: 90 tablet, Rfl: 3 .  torsemide (DEMADEX) 20 MG tablet, Take 2 tablets (40 mg total)  by mouth 2 (two) times daily., Disp: 120 tablet, Rfl: 6 .  Vitamin D, Ergocalciferol, (DRISDOL) 1.25 MG (50000 UT) CAPS capsule, Take 100,000 Units by mouth every Sunday. 2 capsules, Disp: , Rfl: 11 Allergies  Allergen Reactions  . Percocet [Oxycodone-Acetaminophen] Swelling    Lip swelling     Social History   Socioeconomic History  . Marital status: Single    Spouse name: Not on file  . Number of children: Not on file  . Years of education: Not on file  . Highest education level: Not on file  Occupational History  . Not on file  Social Needs  . Financial resource strain: Not on file  . Food insecurity    Worry: Not on file    Inability: Not on file  . Transportation needs    Medical: Not on file    Non-medical: Not on file  Tobacco Use  . Smoking status: Former Games developermoker  . Smokeless tobacco: Never Used  Substance and Sexual Activity  . Alcohol use: Yes    Comment: occ  . Drug use: No  . Sexual activity: Yes    Birth control/protection: Implant  Lifestyle  . Physical activity    Days per week: Not on file    Minutes per session: Not on file  . Stress: Not on file  Relationships  . Social Musicianconnections    Talks on phone: Not on file    Gets together: Not on file  Attends religious service: Not on file    Active member of club or organization: Not on file    Attends meetings of clubs or organizations: Not on file    Relationship status: Not on file  . Intimate partner violence    Fear of current or ex partner: Not on file    Emotionally abused: Not on file    Physically abused: Not on file    Forced sexual activity: Not on file  Other Topics Concern  . Not on file  Social History Narrative  . Not on file    Physical Exam Pulmonary:     Effort: Pulmonary effort is normal. No respiratory distress.  Abdominal:     General: There is no distension.  Musculoskeletal:     Right lower leg: No edema.     Left lower leg: No edema.         No future  appointments.   BP 102/64 (BP Location: Left Arm, Patient Position: Sitting, Cuff Size: Normal)   Pulse 90   Temp 98.4 F (36.9 C)   Wt 169 lb 12.8 oz (77 kg)   SpO2 98%   BMI 34.30 kg/m   Weight yesterday-168 Last visit weight-164 (7/15)  ATF pt CAO x4, she just came in from walking (exercise). She said that she feels great. She's attended all of her recent appointments, including follow ups.  Mrs. Pardoe fills her own pill box, she has no issues with affording her medications.  She understands how and when to take her medications. She denies sob, chest pain and dizziness.  She's back working two days a week and doesn't have any issues with breathing or chest pain while or after work.   Due to Mrs. Zurn progress and understanding of her disease, CHP is no longer needed. I told her that she can call the [facility] or myself.    Bessy Reaney, EMT Paramedic [phone] 10/27/2018    ACTION: Home visit completed

## 2019-01-31 ENCOUNTER — Other Ambulatory Visit: Payer: Self-pay

## 2019-01-31 ENCOUNTER — Encounter (HOSPITAL_BASED_OUTPATIENT_CLINIC_OR_DEPARTMENT_OTHER): Payer: Self-pay

## 2019-01-31 ENCOUNTER — Emergency Department (HOSPITAL_BASED_OUTPATIENT_CLINIC_OR_DEPARTMENT_OTHER): Payer: Medicaid Other

## 2019-01-31 ENCOUNTER — Emergency Department (HOSPITAL_BASED_OUTPATIENT_CLINIC_OR_DEPARTMENT_OTHER)
Admission: EM | Admit: 2019-01-31 | Discharge: 2019-01-31 | Disposition: A | Discharge status: Home / Self Care | Payer: Medicaid Other | Attending: Emergency Medicine | Admitting: Emergency Medicine

## 2019-01-31 DIAGNOSIS — I110 Hypertensive heart disease with heart failure: Secondary | ICD-10-CM | POA: Insufficient documentation

## 2019-01-31 DIAGNOSIS — Z885 Allergy status to narcotic agent status: Secondary | ICD-10-CM | POA: Insufficient documentation

## 2019-01-31 DIAGNOSIS — M10071 Idiopathic gout, right ankle and foot: Secondary | ICD-10-CM | POA: Insufficient documentation

## 2019-01-31 DIAGNOSIS — Z87891 Personal history of nicotine dependence: Secondary | ICD-10-CM | POA: Diagnosis not present

## 2019-01-31 DIAGNOSIS — M25571 Pain in right ankle and joints of right foot: Secondary | ICD-10-CM | POA: Diagnosis present

## 2019-01-31 DIAGNOSIS — Z79899 Other long term (current) drug therapy: Secondary | ICD-10-CM | POA: Diagnosis not present

## 2019-01-31 DIAGNOSIS — Z8673 Personal history of transient ischemic attack (TIA), and cerebral infarction without residual deficits: Secondary | ICD-10-CM | POA: Insufficient documentation

## 2019-01-31 DIAGNOSIS — I50813 Acute on chronic right heart failure: Secondary | ICD-10-CM | POA: Insufficient documentation

## 2019-01-31 DIAGNOSIS — M109 Gout, unspecified: Secondary | ICD-10-CM

## 2019-01-31 MED ORDER — COLCHICINE 0.6 MG PO TABS: 0.6000 mg | ORAL_TABLET | Freq: Every day | ORAL | 0 refills | Status: DC

## 2019-01-31 NOTE — Discharge Instructions (Signed)
Your xray did show some swelling and an old fracture but no other findings. Your symptoms may be related to gout. Please take colchicine as prescribed. You may also take Tylenol with this.   Follow up with your orthopedist regarding your gout flare.   Return to the ED for any worsening symptoms including worsening pain/swelling, redness to the joint, fevers > 100.4, chills.

## 2019-01-31 NOTE — ED Notes (Signed)
All Triage up to this point done by Raynelle Bring RN

## 2019-01-31 NOTE — ED Triage Notes (Signed)
Pt present with ankle pain and swelling to right ankle that started on Sunday. Pt reports alternating ice and heat with no relief and using Epson salt soaks. Denies injury.

## 2019-01-31 NOTE — ED Provider Notes (Signed)
[facility] EMERGENCY DEPARTMENT Provider Note   CSN: [id number] Arrival date & time: 01/31/19  1702     History   Chief Complaint Chief Complaint  Patient presents with  . Ankle Pain    HPI Pamela Hudson is a 33 y.o. female with PMHx sickle cell anemia, HTN, CHF, CVA not currently anticoagulated, who presents to the ED today complaining of gradual onset, constant, achy/throbbing, 8/10, atraumatic, right ankle pain x 4 days.  Patient cannot think of anything including twisting her ankle or increased activity prior to her ankle pain beginning.  She reports that she was sitting on the bed when she noticed her ankle began hurting.  She does report increased swelling as well.  She states that her ankle feels warm to the touch.  Patient has tried alternating ice and heat without relief as well as Epsom salts.  Patient takes Gwenlyn Found at home for her sickle cell and states that this has helped with the pain although when she tries to ambulate she has difficulty.  No calf pain or swelling.  No history of DVT/PE.  No recent prolonged travel or immobilization.  No exogenous hormone use.  No active malignancy.  No hemoptysis.  Denies fever, chills, weakness, numbness, any other associated symptoms.       Past Medical History:  Diagnosis Date  . Cardiomegaly   . CHF (congestive heart failure) (HCC)   . Chronic pain   . CVA (cerebral infarction) 02/2002  . Hypertension   . Liver disease    ?iron  . PCOS (polycystic ovarian syndrome)   . Renal stone   . Sickle cell anemia [facility])     Patient Active Problem List   Diagnosis Date Noted  . Other ascites   . Elevated troponin 08/31/2018  . Acute on chronic right-sided heart failure (HCC) 08/30/2018  . Acute exacerbation of CHF (congestive heart failure) (HCC) 07/09/2018  . Anemia in other chronic diseases classified elsewhere 07/09/2018  . Hyperkalemia 03/25/2018  . Acute respiratory failure with hypoxia (HCC) 02/17/2018  . Essential  hypertension 02/17/2018  . Anemia 02/17/2018  . Dyspnea 02/16/2018  . Gastroenteritis 05/28/2017  . Dehydration 05/28/2017  . AKI (acute kidney injury) (HCC) 05/28/2017  . Sickle cell anemia with crisis (HCC) 05/28/2017  . Hemorrhagic ovarian cyst 12/26/2016  . Hemorrhagic cyst of ovary 12/26/2016  . Pneumonia 11/26/2015  . Sickle cell anemia (HCC) 11/26/2015  . Elevated bilirubin 11/26/2015  . Serum calcium elevated 11/26/2015  . Hyponatremia 11/26/2015  . Leukocytosis 11/26/2015  . CVA (cerebral infarction) 02/13/2012    Past Surgical History:  Procedure Laterality Date  . CHOLECYSTECTOMY    . LIVER BIOPSY  2007  . PARATHYROIDECTOMY    . PORTACATH PLACEMENT    . RIGHT HEART CATH N/A 09/01/2018   Procedure: RIGHT HEART CATH;  Surgeon: Yates Decamp, MD;  Location: [facility] INVASIVE CV LAB;  Service: Cardiovascular;  Laterality: N/A;     OB History   No obstetric history on file.      Home Medications    Prior to Admission medications   Medication Sig Start Date End Date Taking? Authorizing Provider  colchicine 0.6 MG tablet Take 1 tablet (0.6 mg total) by mouth daily. Take 2 tablets by mouth on day 1 and then 1 tablet 1 hour later. Then take 1 tablet twice daily starting on day 2. 01/31/19   Tanda Rockers, PA-C  Deferasirox (JADENU) 360 MG TABS Take 1,080 mg by mouth daily.     [provider]  Etonogestrel (IMPLANON [place]) Inject 1 each into the skin once. Implanted spring 2019    [provider]  folic acid (FOLVITE) 1 MG tablet Take 1 mg by mouth daily. 11/21/15   [provider]  hydroxyurea (HYDREA) 500 MG capsule Take 1,000 mg by mouth daily.     [provider]  magnesium oxide (MAG-OX) 400 MG tablet Take 400 mg by mouth daily. 01/26/18   [provider]  metoprolol succinate (TOPROL XL) 25 MG 24 hr tablet Take 0.5 tablets (12.5 mg total) by mouth daily. 11/29/15   Tresa Garter, MD  potassium chloride SA (K-DUR) 20 MEQ tablet  Take 1 tablet (20 mEq total) by mouth daily. 09/11/18   Clegg, Amy D, NP  torsemide (DEMADEX) 20 MG tablet Take 2 tablets (40 mg total) by mouth 2 (two) times daily. 09/21/18   Clegg, Amy D, NP  Vitamin D, Ergocalciferol, (DRISDOL) 1.25 MG (50000 UT) CAPS capsule Take 100,000 Units by mouth every Sunday. 2 capsules 01/26/18   [provider]    Family History Family History  Problem Relation Age of Onset  . Diabetes Mellitus II Father   . CAD Father   . Diabetes Mellitus II Paternal Grandmother     Social History Social History   Tobacco Use  . Smoking status: Former Research scientist (life sciences)  . Smokeless tobacco: Never Used  Substance Use Topics  . Alcohol use: Yes    Comment: occ  . Drug use: No     Allergies   Percocet [oxycodone-acetaminophen]   Review of Systems Review of Systems  Constitutional: Negative for chills and fever.  Musculoskeletal: Positive for arthralgias and joint swelling.  Skin: Negative for color change.     Physical Exam Updated Vital Signs BP (!) 124/57 (BP Location: Right Arm)   Pulse 80   Temp 99.3 F (37.4 C) (Oral)   Resp 16   Ht 4\' 11"  (1.499 m)   Wt 73.5 kg   SpO2 100%   BMI 32.72 kg/m   Physical Exam Vitals signs and nursing note reviewed.  Constitutional:      Appearance: She is not ill-appearing.  HENT:     Head: Normocephalic and atraumatic.  Eyes:     Conjunctiva/sclera: Conjunctivae normal.  Cardiovascular:     Rate and Rhythm: Normal rate and regular rhythm.     Pulses: Normal pulses.  Pulmonary:     Effort: Pulmonary effort is normal.     Breath sounds: Normal breath sounds. No wheezing, rhonchi or rales.  Musculoskeletal:     Comments: Increased swelling to lateral aspect of right ankle with TTP to lateral malleolus; mild increase in warmth; no erythema; no tenderness to medial malleolus; no tenderness to foot including 5th metatarsal; ROM limited due to pain/swelling; able to dorsiflex and plantarflex with passive ROM;  strength and sensation intact throughout; 2+ DP and PT pulse  Skin:    General: Skin is warm and dry.     Coloration: Skin is not jaundiced.  Neurological:     Mental Status: She is alert.      ED Treatments / Results  Labs (all labs ordered are listed, but only abnormal results are displayed) Labs Reviewed - No data to display  EKG None  Radiology No results found.  Procedures Procedures (including critical care time)  Medications Ordered in ED Medications - No data to display   Initial Impression / Assessment and Plan / ED Course  I have reviewed the triage vital signs  and the nursing notes.  Pertinent labs & imaging results that were available during my care of the patient were reviewed by me and considered in my medical decision making (see chart for details).    33 year old female with history of sickle 52cell anemia, gout coming to the ED today with complaint of atraumatic right ankle pain.  Patient reports she had a history of gout in her left knee which was her first flareup.  She took colchicine with good relief.  States that this feels similar.  She states that she has pain with bearing weight.  Again cannot think of any injury to the ankle.  On exam ankle is slightly swollen compared to left side with increase in warmth, no erythema.  Patient afebrile in the ED without tachycardia or tachypnea.  I doubt septic joint today.  Will obtain x-ray at this time although findings most likely consistent with gout.  Will treat with colchicine and have patient follow-up outpatient with her orthopedist.   X-ray with old avulsion fracture to lateral malleolus.  Patient does report that in 2008 she fractured this ankle.  Unable to see this in our records.  She also notes swelling no obvious acute findings.  Will treat as gout today and advised patient to follow-up with her orthopedist.  Strict return precautions have been discussed including worsening pain, worsening swelling, fevers,  chills, redness to the joint.  Patient is in agreement with plan at this time and stable for discharge home.  This note was prepared using Dragon voice recognition software and may include unintentional dictation errors due to the inherent limitations of voice recognition software.       Final Clinical Impressions(s) / ED Diagnoses   Final diagnoses:  Acute right ankle pain  Acute gout of right ankle, unspecified cause    ED Discharge Orders         Ordered    colchicine 0.6 MG tablet  Daily     01/31/19 2114           Tanda RockersVenter, Maloree Uplinger, PA-C 01/31/19 2115    Vanetta MuldersZackowski, Scott, MD 02/04/19 [phone]

## 2019-08-20 ENCOUNTER — Other Ambulatory Visit: Payer: Self-pay

## 2019-08-20 ENCOUNTER — Emergency Department (HOSPITAL_BASED_OUTPATIENT_CLINIC_OR_DEPARTMENT_OTHER): Payer: Medicaid Other

## 2019-08-20 ENCOUNTER — Encounter (HOSPITAL_BASED_OUTPATIENT_CLINIC_OR_DEPARTMENT_OTHER): Payer: Self-pay | Admitting: *Deleted

## 2019-08-20 ENCOUNTER — Emergency Department (HOSPITAL_BASED_OUTPATIENT_CLINIC_OR_DEPARTMENT_OTHER)
Admission: EM | Admit: 2019-08-20 | Discharge: 2019-08-20 | Disposition: A | Discharge status: Home / Self Care | Payer: Medicaid Other | Attending: Emergency Medicine | Admitting: Emergency Medicine

## 2019-08-20 DIAGNOSIS — Z9861 Coronary angioplasty status: Secondary | ICD-10-CM | POA: Diagnosis not present

## 2019-08-20 DIAGNOSIS — M79672 Pain in left foot: Secondary | ICD-10-CM | POA: Diagnosis not present

## 2019-08-20 DIAGNOSIS — Z87891 Personal history of nicotine dependence: Secondary | ICD-10-CM | POA: Insufficient documentation

## 2019-08-20 DIAGNOSIS — I110 Hypertensive heart disease with heart failure: Secondary | ICD-10-CM | POA: Insufficient documentation

## 2019-08-20 DIAGNOSIS — I5031 Acute diastolic (congestive) heart failure: Secondary | ICD-10-CM | POA: Diagnosis not present

## 2019-08-20 DIAGNOSIS — Z79899 Other long term (current) drug therapy: Secondary | ICD-10-CM | POA: Insufficient documentation

## 2019-08-20 MED ORDER — HYDROCODONE-ACETAMINOPHEN 5-325 MG PO TABS
1.0000 | ORAL_TABLET | Freq: Once | ORAL | Status: AC
  Administered 2019-08-20: 1 via ORAL
  Filled 2019-08-20: qty 1

## 2019-08-20 MED ORDER — PREDNISONE 10 MG PO TABS: 40.0000 mg | ORAL_TABLET | Freq: Every day | ORAL | 0 refills | Status: AC

## 2019-08-20 MED ORDER — METHYLPREDNISOLONE SODIUM SUCC 125 MG IJ SOLR
125.0000 mg | Freq: Once | INTRAMUSCULAR | Status: AC
  Administered 2019-08-20: 125 mg via INTRAMUSCULAR
  Filled 2019-08-20: qty 2

## 2019-08-20 NOTE — ED Provider Notes (Signed)
[facility] EMERGENCY DEPARTMENT Provider Note   CSN: [id number] Arrival date & time: 08/20/19  1833     History Chief Complaint  Patient presents with  . Foot Pain    sickle cell    Pamela Hudson is a 34 y.o. female.  Patient is a 34 year old female with past medical history of CHF, CVA, sickle cell anemia presenting to the emergency department for atraumatic left foot pain.  This is been going on for the last couple days.  She reports that she does not usually get any foot pain with her sickle cell.  She reports the pain is worse with walking on it.  She has tried one of her Dilaudid hills at home which has not helped.  She reports swelling to the bottom of her foot.  Denies injury or trauma.        Past Medical History:  Diagnosis Date  . Cardiomegaly   . CHF (congestive heart failure) (HCC)   . Chronic pain   . CVA (cerebral infarction) 02/2002  . Hypertension   . Liver disease    ?iron  . PCOS (polycystic ovarian syndrome)   . Renal stone   . Sickle cell anemia [facility])     Patient Active Problem List   Diagnosis Date Noted  . Other ascites   . Elevated troponin 08/31/2018  . Acute on chronic right-sided heart failure (HCC) 08/30/2018  . Acute exacerbation of CHF (congestive heart failure) (HCC) 07/09/2018  . Anemia in other chronic diseases classified elsewhere 07/09/2018  . Hyperkalemia 03/25/2018  . Acute respiratory failure with hypoxia (HCC) 02/17/2018  . Essential hypertension 02/17/2018  . Anemia 02/17/2018  . Dyspnea 02/16/2018  . Gastroenteritis 05/28/2017  . Dehydration 05/28/2017  . AKI (acute kidney injury) (HCC) 05/28/2017  . Sickle cell anemia with crisis (HCC) 05/28/2017  . Hemorrhagic ovarian cyst 12/26/2016  . Hemorrhagic cyst of ovary 12/26/2016  . Pneumonia 11/26/2015  . Sickle cell anemia (HCC) 11/26/2015  . Elevated bilirubin 11/26/2015  . Serum calcium elevated 11/26/2015  . Hyponatremia 11/26/2015  . Leukocytosis  11/26/2015  . CVA (cerebral infarction) 02/13/2012    Past Surgical History:  Procedure Laterality Date  . CHOLECYSTECTOMY    . LIVER BIOPSY  2007  . PARATHYROIDECTOMY    . PORTACATH PLACEMENT    . RIGHT HEART CATH N/A 09/01/2018   Procedure: RIGHT HEART CATH;  Surgeon: Yates Decamp, MD;  Location: [facility] INVASIVE CV LAB;  Service: Cardiovascular;  Laterality: N/A;     OB History   No obstetric history on file.     Family History  Problem Relation Age of Onset  . Diabetes Mellitus II Father   . CAD Father   . Diabetes Mellitus II Paternal Grandmother     Social History   Tobacco Use  . Smoking status: Former Games developer  . Smokeless tobacco: Never Used  Substance Use Topics  . Alcohol use: Yes    Comment: occ  . Drug use: No    Home Medications Prior to Admission medications   Medication Sig Start Date End Date Taking? Authorizing Provider  colchicine 0.6 MG tablet Take 1 tablet (0.6 mg total) by mouth daily. Take 2 tablets by mouth on day 1 and then 1 tablet 1 hour later. Then take 1 tablet twice daily starting on day 2. 01/31/19   Tanda Rockers, PA-C  Deferasirox (JADENU) 360 MG TABS Take 1,080 mg by mouth daily.     [provider]  Etonogestrel (IMPLANON [place]) Inject 1  each into the skin once. Implanted spring 2019    [provider]  folic acid (FOLVITE) 1 MG tablet Take 1 mg by mouth daily. 11/21/15   [provider]  hydroxyurea (HYDREA) 500 MG capsule Take 1,000 mg by mouth daily.     [provider]  magnesium oxide (MAG-OX) 400 MG tablet Take 400 mg by mouth daily. 01/26/18   [provider]  metoprolol succinate (TOPROL XL) 25 MG 24 hr tablet Take 0.5 tablets (12.5 mg total) by mouth daily. 11/29/15   Tresa Garter, MD  potassium chloride SA (K-DUR) 20 MEQ tablet Take 1 tablet (20 mEq total) by mouth daily. 09/11/18   Clegg, Amy D, NP  torsemide (DEMADEX) 20 MG tablet Take 2 tablets (40 mg total) by mouth 2 (two) times  daily. 09/21/18   Clegg, Amy D, NP  Vitamin D, Ergocalciferol, (DRISDOL) 1.25 MG (50000 UT) CAPS capsule Take 100,000 Units by mouth every Sunday. 2 capsules 01/26/18   [provider]    Allergies    Percocet [oxycodone-acetaminophen]  Review of Systems   Review of Systems  Constitutional: Negative for chills and fever.  Respiratory: Negative for shortness of breath.   Cardiovascular: Negative for chest pain.  Musculoskeletal: Positive for arthralgias and gait problem.  Skin: Negative for rash and wound.  All other systems reviewed and are negative.   Physical Exam Updated Vital Signs BP 135/80 (BP Location: Left Arm)   Pulse (!) 104   Temp 98.1 F (36.7 C)   SpO2 100%   Physical Exam Vitals and nursing note reviewed.  Constitutional:      General: She is not in acute distress.    Appearance: Normal appearance. She is not ill-appearing, toxic-appearing or diaphoretic.  HENT:     Head: Normocephalic.  Eyes:     Conjunctiva/sclera: Conjunctivae normal.  Pulmonary:     Effort: Pulmonary effort is normal.  Musculoskeletal:     Comments: Normal ankle.  Normal Achilles tendon.  She does have pain and swelling to the plantar surface of her foot, worse on the lateral aspect.  There are no skin changes.  Her sensation and distal pulses are normal.  Skin:    General: Skin is dry.     Capillary Refill: Capillary refill takes less than 2 seconds.     Coloration: Skin is not jaundiced.     Findings: No bruising, erythema, lesion or rash.  Neurological:     Mental Status: She is alert.     Sensory: No sensory deficit.  Psychiatric:        Mood and Affect: Mood normal.     ED Results / Procedures / Treatments   Labs (all labs ordered are listed, but only abnormal results are displayed) Labs Reviewed - No data to display  EKG None  Radiology DG Foot Complete Left  Result Date: 08/20/2019 CLINICAL DATA:  left foot pain for 1 week EXAM: LEFT FOOT - COMPLETE 3+ VIEW  COMPARISON:  None. FINDINGS: There is no evidence of fracture or dislocation. There is no evidence of arthropathy or other focal bone abnormality. Soft tissues are unremarkable. IMPRESSION: Negative. Electronically Signed   By: Ulyses Jarred M.D.   On: 08/20/2019 19:40    Procedures Procedures (including critical care time)  Medications Ordered in ED Medications  methylPREDNISolone sodium succinate (SOLU-MEDROL) 125 mg/2 mL injection 125 mg (has no administration in time range)  HYDROcodone-acetaminophen (NORCO/VICODIN) 5-325 MG per tablet 1 tablet (has no administration in time range)  ED Course  I have reviewed the triage vital signs and the nursing notes.  Pertinent labs & imaging results that were available during my care of the patient were reviewed by me and considered in my medical decision making (see chart for details).  Clinical Course as of Aug 20 2043  Mon Aug 20, 2019  2044 Patient with atraumatic left plantar foot pain.  X-ray is normal.  No signs of infection or abscess.  She may have plantar fasciitis.  She was offered crutches but reports that she has some at home she can use.  We will give her a shot of a steroid and give her a prescription for steroids.  She already takes Dilaudid at home which she can use as needed.  Will refer to podiatry if the symptoms continue   [KM]    Clinical Course User Index [KM] Jeral Pinch   MDM Rules/Calculators/A&P                      Based on review of vitals, medical screening exam, lab work and/or imaging, there does not appear to be an acute, emergent etiology for the patient's symptoms. Counseled pt on good return precautions and encouraged both PCP and ED follow-up as needed.  Prior to discharge, I also discussed incidental imaging findings with patient in detail and advised appropriate, recommended follow-up in detail.  Clinical Impression: 1. Left foot pain     Disposition: Discharge  Prior to providing a  prescription for a controlled substance, I independently reviewed the patient's recent prescription history on the [place] Controlled Substance Reporting System. The patient had no recent or regular prescriptions and was deemed appropriate for a brief, less than 3 day prescription of narcotic for acute analgesia.  This note was prepared with assistance of Conservation officer, historic buildings. Occasional wrong-word or sound-a-like substitutions may have occurred due to the inherent limitations of voice recognition software.  Final Clinical Impression(s) / ED Diagnoses Final diagnoses:  Left foot pain    Rx / DC Orders ED Discharge Orders    None       Jeral Pinch 08/20/19 2046    Pollyann Savoy, MD 08/20/19 2120

## 2019-08-20 NOTE — ED Triage Notes (Addendum)
Pt c/o left foot pain x 1 day ,  unrelieved by dilaudid tabs sent here from [facility] for eval

## 2019-08-20 NOTE — Discharge Instructions (Signed)
You are seen today for foot pain.  Your x-ray was normal.  You may have inflammation in the soft tissues in your foot.  We have given you some steroid medication.  Continues to take Dilaudid as needed and follow-up with podiatry.  He can use crutches for ambulation if needed.  Please stay out of work for the next couple of days to rest her foot

## 2019-08-30 ENCOUNTER — Inpatient Hospital Stay (HOSPITAL_BASED_OUTPATIENT_CLINIC_OR_DEPARTMENT_OTHER)
Admission: EM | Admit: 2019-08-30 | Discharge: 2019-09-04 | DRG: 638 | Disposition: A | Discharge status: Home / Self Care | Payer: Medicaid Other | Attending: Internal Medicine | Admitting: Internal Medicine

## 2019-08-30 ENCOUNTER — Emergency Department (HOSPITAL_BASED_OUTPATIENT_CLINIC_OR_DEPARTMENT_OTHER): Payer: Medicaid Other

## 2019-08-30 ENCOUNTER — Other Ambulatory Visit: Payer: Self-pay

## 2019-08-30 ENCOUNTER — Encounter (HOSPITAL_BASED_OUTPATIENT_CLINIC_OR_DEPARTMENT_OTHER): Payer: Self-pay | Admitting: Emergency Medicine

## 2019-08-30 ENCOUNTER — Inpatient Hospital Stay (HOSPITAL_COMMUNITY): Payer: Medicaid Other

## 2019-08-30 DIAGNOSIS — Z20822 Contact with and (suspected) exposure to covid-19: Secondary | ICD-10-CM | POA: Diagnosis present

## 2019-08-30 DIAGNOSIS — Z888 Allergy status to other drugs, medicaments and biological substances status: Secondary | ICD-10-CM

## 2019-08-30 DIAGNOSIS — E1122 Type 2 diabetes mellitus with diabetic chronic kidney disease: Secondary | ICD-10-CM | POA: Diagnosis present

## 2019-08-30 DIAGNOSIS — E0800 Diabetes mellitus due to underlying condition with hyperosmolarity without nonketotic hyperglycemic-hyperosmolar coma (NKHHC): Secondary | ICD-10-CM

## 2019-08-30 DIAGNOSIS — E861 Hypovolemia: Secondary | ICD-10-CM | POA: Diagnosis present

## 2019-08-30 DIAGNOSIS — Z8249 Family history of ischemic heart disease and other diseases of the circulatory system: Secondary | ICD-10-CM

## 2019-08-30 DIAGNOSIS — K769 Liver disease, unspecified: Secondary | ICD-10-CM | POA: Diagnosis present

## 2019-08-30 DIAGNOSIS — B370 Candidal stomatitis: Secondary | ICD-10-CM | POA: Diagnosis not present

## 2019-08-30 DIAGNOSIS — E1100 Type 2 diabetes mellitus with hyperosmolarity without nonketotic hyperglycemic-hyperosmolar coma (NKHHC): Secondary | ICD-10-CM | POA: Diagnosis present

## 2019-08-30 DIAGNOSIS — I272 Pulmonary hypertension, unspecified: Secondary | ICD-10-CM

## 2019-08-30 DIAGNOSIS — I50812 Chronic right heart failure: Secondary | ICD-10-CM | POA: Diagnosis present

## 2019-08-30 DIAGNOSIS — E282 Polycystic ovarian syndrome: Secondary | ICD-10-CM | POA: Diagnosis present

## 2019-08-30 DIAGNOSIS — I2729 Other secondary pulmonary hypertension: Secondary | ICD-10-CM | POA: Diagnosis present

## 2019-08-30 DIAGNOSIS — R531 Weakness: Secondary | ICD-10-CM | POA: Diagnosis present

## 2019-08-30 DIAGNOSIS — D571 Sickle-cell disease without crisis: Secondary | ICD-10-CM | POA: Diagnosis present

## 2019-08-30 DIAGNOSIS — E860 Dehydration: Secondary | ICD-10-CM | POA: Diagnosis present

## 2019-08-30 DIAGNOSIS — E1165 Type 2 diabetes mellitus with hyperglycemia: Secondary | ICD-10-CM | POA: Diagnosis not present

## 2019-08-30 DIAGNOSIS — Z8673 Personal history of transient ischemic attack (TIA), and cerebral infarction without residual deficits: Secondary | ICD-10-CM | POA: Diagnosis not present

## 2019-08-30 DIAGNOSIS — I130 Hypertensive heart and chronic kidney disease with heart failure and stage 1 through stage 4 chronic kidney disease, or unspecified chronic kidney disease: Secondary | ICD-10-CM | POA: Diagnosis present

## 2019-08-30 DIAGNOSIS — D631 Anemia in chronic kidney disease: Secondary | ICD-10-CM | POA: Diagnosis present

## 2019-08-30 DIAGNOSIS — E875 Hyperkalemia: Secondary | ICD-10-CM | POA: Diagnosis present

## 2019-08-30 DIAGNOSIS — G8929 Other chronic pain: Secondary | ICD-10-CM | POA: Diagnosis present

## 2019-08-30 DIAGNOSIS — Z833 Family history of diabetes mellitus: Secondary | ICD-10-CM | POA: Diagnosis not present

## 2019-08-30 DIAGNOSIS — E785 Hyperlipidemia, unspecified: Secondary | ICD-10-CM | POA: Diagnosis present

## 2019-08-30 DIAGNOSIS — R7401 Elevation of levels of liver transaminase levels: Secondary | ICD-10-CM

## 2019-08-30 DIAGNOSIS — N183 Chronic kidney disease, stage 3 unspecified: Secondary | ICD-10-CM | POA: Diagnosis present

## 2019-08-30 DIAGNOSIS — I4891 Unspecified atrial fibrillation: Secondary | ICD-10-CM | POA: Diagnosis present

## 2019-08-30 DIAGNOSIS — Z9049 Acquired absence of other specified parts of digestive tract: Secondary | ICD-10-CM | POA: Diagnosis not present

## 2019-08-30 DIAGNOSIS — R748 Abnormal levels of other serum enzymes: Secondary | ICD-10-CM | POA: Diagnosis present

## 2019-08-30 DIAGNOSIS — Z885 Allergy status to narcotic agent status: Secondary | ICD-10-CM

## 2019-08-30 DIAGNOSIS — I10 Essential (primary) hypertension: Secondary | ICD-10-CM | POA: Diagnosis present

## 2019-08-30 DIAGNOSIS — Z87891 Personal history of nicotine dependence: Secondary | ICD-10-CM

## 2019-08-30 DIAGNOSIS — N189 Chronic kidney disease, unspecified: Secondary | ICD-10-CM

## 2019-08-30 LAB — COMPREHENSIVE METABOLIC PANEL
ALT: 92 U/L — ABNORMAL HIGH (ref 0–44)
AST: 50 U/L — ABNORMAL HIGH (ref 15–41)
Albumin: 3.4 g/dL — ABNORMAL LOW (ref 3.5–5.0)
Alkaline Phosphatase: 181 U/L — ABNORMAL HIGH (ref 38–126)
Anion gap: 14 (ref 5–15)
BUN: 66 mg/dL — ABNORMAL HIGH (ref 6–20)
CO2: 24 mmol/L (ref 22–32)
Calcium: 8.4 mg/dL — ABNORMAL LOW (ref 8.9–10.3)
Chloride: 86 mmol/L — ABNORMAL LOW (ref 98–111)
Creatinine, Ser: 1.87 mg/dL — ABNORMAL HIGH (ref 0.44–1.00)
GFR calc Af Amer: 41 mL/min — ABNORMAL LOW (ref >60)
GFR calc non Af Amer: 35 mL/min — ABNORMAL LOW (ref >60)
Glucose, Bld: 1185 mg/dL (ref 70–99)
Potassium: 5.8 mmol/L — ABNORMAL HIGH (ref 3.5–5.1)
Sodium: 124 mmol/L — ABNORMAL LOW (ref 135–145)
Total Bilirubin: 9.5 mg/dL — ABNORMAL HIGH (ref 0.3–1.2)
Total Protein: 9.7 g/dL — ABNORMAL HIGH (ref 6.5–8.1)

## 2019-08-30 LAB — BASIC METABOLIC PANEL
Anion gap: 10 (ref 5–15)
BUN: 56 mg/dL — ABNORMAL HIGH (ref 6–20)
CO2: 26 mmol/L (ref 22–32)
Calcium: 9.4 mg/dL (ref 8.9–10.3)
Chloride: 103 mmol/L (ref 98–111)
Creatinine, Ser: 1.79 mg/dL — ABNORMAL HIGH (ref 0.44–1.00)
GFR calc Af Amer: 43 mL/min — ABNORMAL LOW (ref >60)
GFR calc non Af Amer: 37 mL/min — ABNORMAL LOW (ref >60)
Glucose, Bld: 321 mg/dL — ABNORMAL HIGH (ref 70–99)
Potassium: 5.4 mmol/L — ABNORMAL HIGH (ref 3.5–5.1)
Sodium: 139 mmol/L (ref 135–145)

## 2019-08-30 LAB — URINALYSIS, ROUTINE W REFLEX MICROSCOPIC
Bilirubin Urine: NEGATIVE
Glucose, UA: >=500 mg/dL — AB
Hgb urine dipstick: NEGATIVE
Ketones, ur: NEGATIVE mg/dL
Leukocytes,Ua: NEGATIVE
Nitrite: NEGATIVE
Protein, ur: NEGATIVE mg/dL
Specific Gravity, Urine: <1.005 — ABNORMAL LOW (ref 1.005–1.030)
pH: 5.5 (ref 5.0–8.0)

## 2019-08-30 LAB — CBG MONITORING, ED
Glucose-Capillary: >600 mg/dL (ref 70–99)
Glucose-Capillary: >600 mg/dL (ref 70–99)
Glucose-Capillary: >600 mg/dL (ref 70–99)
Glucose-Capillary: >600 mg/dL (ref 70–99)
Glucose-Capillary: >600 mg/dL (ref 70–99)
Glucose-Capillary: 589 mg/dL (ref 70–99)
Glucose-Capillary: >600 mg/dL (ref 70–99)
Glucose-Capillary: 505 mg/dL (ref 70–99)

## 2019-08-30 LAB — RETICULOCYTES
Immature Retic Fract: 13.2 % (ref 2.3–15.9)
RBC.: 1.99 MIL/uL — ABNORMAL LOW (ref 3.87–5.11)
Retic Count, Absolute: 101.1 10*3/uL (ref 19.0–186.0)
Retic Ct Pct: 5.1 % — ABNORMAL HIGH (ref 0.4–3.1)

## 2019-08-30 LAB — PREGNANCY, URINE: Preg Test, Ur: NEGATIVE

## 2019-08-30 LAB — CBC WITH DIFFERENTIAL/PLATELET
Abs Immature Granulocytes: 0 10*3/uL (ref 0.00–0.07)
Basophils Absolute: 0 10*3/uL (ref 0.0–0.1)
Basophils Relative: 0 %
Eosinophils Absolute: 0.3 10*3/uL (ref 0.0–0.5)
Eosinophils Relative: 1 %
HCT: 20.8 % — ABNORMAL LOW (ref 36.0–46.0)
Hemoglobin: 7.2 g/dL — ABNORMAL LOW (ref 12.0–15.0)
Lymphocytes Relative: 6 %
Lymphs Abs: 1.8 10*3/uL (ref 0.7–4.0)
MCH: 39.1 pg — ABNORMAL HIGH (ref 26.0–34.0)
MCHC: 34.6 g/dL (ref 30.0–36.0)
MCV: 113 fL — ABNORMAL HIGH (ref 80.0–100.0)
Monocytes Absolute: 2.4 10*3/uL — ABNORMAL HIGH (ref 0.1–1.0)
Monocytes Relative: 8 %
Neutro Abs: 25.2 10*3/uL — ABNORMAL HIGH (ref 1.7–7.7)
Neutrophils Relative %: 85 %
Platelets: 314 10*3/uL (ref 150–400)
RBC: 1.84 MIL/uL — ABNORMAL LOW (ref 3.87–5.11)
RDW: 18.1 % — ABNORMAL HIGH (ref 11.5–15.5)
WBC: 29.7 10*3/uL — ABNORMAL HIGH (ref 4.0–10.5)
nRBC: 0.2 % (ref 0.0–0.2)

## 2019-08-30 LAB — SARS CORONAVIRUS 2 BY RT PCR (HOSPITAL ORDER, PERFORMED IN ~~LOC~~ HOSPITAL LAB): SARS Coronavirus 2: NEGATIVE

## 2019-08-30 LAB — URINALYSIS, MICROSCOPIC (REFLEX)

## 2019-08-30 LAB — IRON AND TIBC
Iron: 163 ug/dL (ref 28–170)
Saturation Ratios: 114 % — ABNORMAL HIGH (ref 10.4–31.8)
TIBC: 143 ug/dL — ABNORMAL LOW (ref 250–450)

## 2019-08-30 LAB — OSMOLALITY: Osmolality: 364 mOsm/kg (ref 275–295)

## 2019-08-30 LAB — LIPASE, BLOOD: Lipase: 219 U/L — ABNORMAL HIGH (ref 11–51)

## 2019-08-30 LAB — MONONUCLEOSIS SCREEN: Mono Screen: NEGATIVE

## 2019-08-30 LAB — GLUCOSE, CAPILLARY
Glucose-Capillary: 308 mg/dL — ABNORMAL HIGH (ref 70–99)
Glucose-Capillary: 298 mg/dL — ABNORMAL HIGH (ref 70–99)
Glucose-Capillary: 309 mg/dL — ABNORMAL HIGH (ref 70–99)
Glucose-Capillary: 277 mg/dL — ABNORMAL HIGH (ref 70–99)

## 2019-08-30 LAB — VITAMIN B12: Vitamin B-12: 1287 pg/mL — ABNORMAL HIGH (ref 180–914)

## 2019-08-30 LAB — GROUP A STREP BY PCR: Group A Strep by PCR: NOT DETECTED

## 2019-08-30 LAB — FERRITIN: Ferritin: >7500 ng/mL — ABNORMAL HIGH (ref 11–307)

## 2019-08-30 LAB — FOLATE: Folate: 35.8 ng/mL (ref >5.9)

## 2019-08-30 MED ORDER — HEPARIN SODIUM (PORCINE) 5000 UNIT/ML IJ SOLN
5000.0000 [IU] | Freq: Three times a day (TID) | INTRAMUSCULAR | Status: DC
  Administered 2019-08-30 – 2019-09-04 (×15): 5000 [IU] via SUBCUTANEOUS
  Filled 2019-08-30 (×15): qty 1

## 2019-08-30 MED ORDER — DEXTROSE 50 % IV SOLN: 0.0000 mL | INTRAVENOUS | Status: DC | PRN

## 2019-08-30 MED ORDER — SODIUM CHLORIDE 0.9 % IV BOLUS
1000.0000 mL | Freq: Once | INTRAVENOUS | Status: AC
  Administered 2019-08-30: 1000 mL via INTRAVENOUS

## 2019-08-30 MED ORDER — CHLORHEXIDINE GLUCONATE CLOTH 2 % EX PADS
6.0000 | MEDICATED_PAD | Freq: Every day | CUTANEOUS | Status: DC
  Administered 2019-08-31 – 2019-09-04 (×5): 6 via TOPICAL

## 2019-08-30 MED ORDER — DEXTROSE-NACL 5-0.45 % IV SOLN: INTRAVENOUS | Status: DC

## 2019-08-30 MED ORDER — LACTATED RINGERS IV BOLUS
1000.0000 mL | Freq: Once | INTRAVENOUS | Status: AC
  Administered 2019-08-30: 1000 mL via INTRAVENOUS

## 2019-08-30 MED ORDER — SODIUM CHLORIDE 0.9 % IV SOLN: Freq: Once | INTRAVENOUS | Status: DC

## 2019-08-30 MED ORDER — SODIUM CHLORIDE 0.9 % IV SOLN: INTRAVENOUS | Status: DC

## 2019-08-30 MED ORDER — SODIUM ZIRCONIUM CYCLOSILICATE 10 G PO PACK
10.0000 g | PACK | Freq: Once | ORAL | Status: AC
  Administered 2019-08-31: 10 g via ORAL
  Filled 2019-08-30: qty 1

## 2019-08-30 MED ORDER — INSULIN REGULAR(HUMAN) IN NACL 100-0.9 UT/100ML-% IV SOLN
INTRAVENOUS | Status: DC
  Administered 2019-08-30: 11 [IU]/h via INTRAVENOUS
  Filled 2019-08-30: qty 100

## 2019-08-30 MED ORDER — SODIUM CHLORIDE 0.9% FLUSH
10.0000 mL | INTRAVENOUS | Status: DC | PRN
  Administered 2019-09-04: 10 mL

## 2019-08-30 MED ORDER — SODIUM CHLORIDE 0.9 % IV SOLN
INTRAVENOUS | Status: DC | PRN
  Administered 2019-08-30: 500 mL via INTRAVENOUS

## 2019-08-30 MED ORDER — HYDROXYUREA 500 MG PO CAPS
1000.0000 mg | ORAL_CAPSULE | Freq: Every day | ORAL | Status: DC
  Administered 2019-08-31 – 2019-09-04 (×5): 1000 mg via ORAL
  Filled 2019-08-30 (×5): qty 2

## 2019-08-30 MED ORDER — FOLIC ACID 1 MG PO TABS
1.0000 mg | ORAL_TABLET | Freq: Every day | ORAL | Status: DC
  Administered 2019-08-31 – 2019-09-04 (×5): 1 mg via ORAL
  Filled 2019-08-30 (×5): qty 1

## 2019-08-30 MED ORDER — INSULIN REGULAR(HUMAN) IN NACL 100-0.9 UT/100ML-% IV SOLN
INTRAVENOUS | Status: DC
  Administered 2019-08-30: 10 [IU]/h via INTRAVENOUS
  Filled 2019-08-30 (×2): qty 100

## 2019-08-30 NOTE — H&P (Signed)
History and Physical    Pamela Hudson QPR:[id number] DOB: [birth year] DOA: 08/30/2019  PCP: Curt Jews, PA-C  Patient coming from: [place] [facility]  I have personally briefly reviewed patient's old medical records in [facility] [place]  Chief Complaint: Hyperglycemia  HPI: Pamela Hudson is a 34 y.o. female with medical history significant for pulmonary hypertension with chronic right-sided heart failure, CKD stage III, sickle cell disease with anemia, and hypertension who presented to [facility] ED for evaluation of generalized weakness and polydipsia.  History is limited from patient due to excessive somnolence therefore majority of history is obtained from EDP and chart review.  Patient reportedly presented to [place] ED for evaluation of generalized weakness and polydipsia.  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  She has been having polyuria without dysuria.  She reported generalized abdominal pain without nausea or vomiting.  At time of evaluation upon transfer to [facility] patient is excessively somnolent and unable to provide further history.  [facility] ED Course:  Initial vitals showed BP 113/51, pulse 109, RR 18, temp 97.9 Fahrenheit, SPO2 100% on room air.  Labs are notable for serum glucose 1185, sodium 124 (150 when corrected for hyperglycemia), potassium 5.8, chloride 86, bicarb 24, BUN 66, creatinine 1.87, AST 50, ALT 92, alk phos 181, total bilirubin 9.5, WBC 29.7, hemoglobin 7.2, hematocrit 20.8, MCV 113, platelets 314,000, lipase 219, serum osmolality 364.  SARS-CoV-2 PCR is negative.  Urinalysis shows >500 glucose, negative ketones, negative nitrites, negative leukocytes, 0-5 RBC/hpf, 0-5 WBC/hpf, few bacteria on microscopy.  Urine pregnancy test was negative.  Mono screen is negative.  Group A strep PCR is negative.  Portable chest x-ray shows right  Port-A-Cath with tip at the cavoatrial junction, no focal consolidation, edema, or effusion.  Patient was given 1 L LR, 1 L NS, and started on insulin infusion.  The hospitalist service was consulted to transfer for admission and further management.  Review of Systems:  Unable to obtain full review of systems due to excessive somnolence.  Past Medical History:  Diagnosis Date   Cardiomegaly    CHF (congestive heart failure) (HCC)    Chronic pain    CVA (cerebral infarction) 02/2002   Hypertension    Liver disease    ?iron   PCOS (polycystic ovarian syndrome)    Renal stone    Sickle cell anemia ([place])     Past Surgical History:  Procedure Laterality Date   CHOLECYSTECTOMY     LIVER BIOPSY  2007   PARATHYROIDECTOMY     PORTACATH PLACEMENT     RIGHT HEART CATH N/A 09/01/2018   Procedure: RIGHT HEART CATH;  Surgeon: Adrian Prows, MD;  Location: [facility];  Service: Cardiovascular;  Laterality: N/A;    Social History:  reports that she has quit smoking. She has never used smokeless tobacco. She reports current alcohol use. She reports that she does not use drugs.  Allergies  Allergen Reactions   Percocet [Oxycodone-Acetaminophen] Swelling    Lip swelling    Family History  Problem Relation Age of Onset   Diabetes Mellitus II Father    CAD Father    Diabetes Mellitus II Paternal Grandmother      Prior to Admission medications   Medication Sig Start Date End Date Taking? Authorizing Provider  colchicine 0.6 MG tablet Take 1 tablet (0.6 mg total) by mouth  daily. Take 2 tablets by mouth on day 1 and then 1 tablet 1 hour later. Then take 1 tablet twice daily starting on day 2. 01/31/19   Eustaquio Maize, PA-C  Deferasirox (JADENU) 360 MG TABS Take 1,080 mg by mouth daily.     [provider]  Etonogestrel (IMPLANON [place]) Inject 1 each into the skin once. Implanted spring 2019    [provider]  folic acid (FOLVITE) 1 MG tablet Take 1  mg by mouth daily. 11/21/15   [provider]  hydroxyurea (HYDREA) 500 MG capsule Take 1,000 mg by mouth daily.     [provider]  magnesium oxide (MAG-OX) 400 MG tablet Take 400 mg by mouth daily. 01/26/18   [provider]  metoprolol succinate (TOPROL XL) 25 MG 24 hr tablet Take 0.5 tablets (12.5 mg total) by mouth daily. 11/29/15   Tresa Garter, MD  potassium chloride SA (K-DUR) 20 MEQ tablet Take 1 tablet (20 mEq total) by mouth daily. 09/11/18   Clegg, Amy D, NP  torsemide (DEMADEX) 20 MG tablet Take 2 tablets (40 mg total) by mouth 2 (two) times daily. 09/21/18   Clegg, Amy D, NP  Vitamin D, Ergocalciferol, (DRISDOL) 1.25 MG (50000 UT) CAPS capsule Take 100,000 Units by mouth every Sunday. 2 capsules 01/26/18   [provider]    Physical Exam: Vitals:   08/30/19 1836 08/30/19 1838 08/30/19 1839 08/30/19 1949  BP:    109/74  Pulse: 88 88 88 91  Resp: _0 Temp:    98.3 F (36.8 C)  TempSrc:    Oral  SpO2: 100% 100% 100% 97%  Weight:      Height:       Limited due to cooperation/excessive somnolence. Constitutional: Resting in bed in the right lateral decubitus position, NAD, calm, appears comfortable Eyes: PERRL, lids and conjunctivae normal ENMT: Mucous membranes are dry. Posterior pharynx clear of any exudate or lesions. Neck: normal, supple, no masses. Respiratory: clear to auscultation bilaterally, no wheezing, no crackles. Normal respiratory effort. No accessory muscle use.  Cardiovascular: Regular rate and rhythm, no murmurs / rubs / gallops. No extremity edema. 2+ pedal pulses. Abdomen: no obvious tenderness to palpation, no masses palpated. Bowel sounds positive.  Musculoskeletal: no clubbing / cyanosis. No joint deformity upper and lower extremities.  No contractures. Skin: no rashes, lesions, ulcers. No induration Neurologic: Somnolent, sensation appears intact.  Intermittently moving all extremities  spontaneously. Psychiatric: Very somnolent, intermittently opens eyes and answers questions with one-word sentences but otherwise not interactive or providing further history.  Labs on Admission: I have personally reviewed following labs and imaging studies  CBC: Recent Labs  Lab 08/30/19 1134  WBC 29.7*  NEUTROABS 25.2*  HGB 7.2*  HCT 20.8*  MCV 113.0*  PLT 950   Basic Metabolic Panel: Recent Labs  Lab 08/30/19 1134  NA 124*  K 5.8*  CL 86*  CO2 24  GLUCOSE 1,185*  BUN 66*  CREATININE 1.87*  CALCIUM 8.4*   GFR: Estimated Creatinine Clearance: 37.6 mL/min (A) (by C-G formula based on SCr of 1.87 mg/dL (H)). Liver Function Tests: Recent Labs  Lab 08/30/19 1134  AST 50*  ALT 92*  ALKPHOS 181*  BILITOT 9.5*  PROT 9.7*  ALBUMIN 3.4*   Recent Labs  Lab 08/30/19 1134  LIPASE 219*   No results for input(s): AMMONIA in the last 168 hours. Coagulation Profile: No results for input(s): INR, PROTIME in the last 168 hours. Cardiac  Enzymes: No results for input(s): CKTOTAL, CKMB, CKMBINDEX, TROPONINI in the last 168 hours. BNP (last 3 results) No results for input(s): PROBNP in the last 8760 hours. HbA1C: No results for input(s): HGBA1C in the last 72 hours. CBG: Recent Labs  Lab 08/30/19 1525 08/30/19 1614 08/30/19 1649 08/30/19 1725 08/30/19 1832  GLUCAP >600* >600* >600* 589* 505*   Lipid Profile: No results for input(s): CHOL, HDL, LDLCALC, TRIG, CHOLHDL, LDLDIRECT in the last 72 hours. Thyroid Function Tests: No results for input(s): TSH, T4TOTAL, FREET4, T3FREE, THYROIDAB in the last 72 hours. Anemia Panel: No results for input(s): VITAMINB12, FOLATE, FERRITIN, TIBC, IRON, RETICCTPCT in the last 72 hours. Urine analysis:    Component Value Date/Time   COLORURINE YELLOW 08/30/2019 1117   APPEARANCEUR CLEAR 08/30/2019 1117   LABSPEC <1.005 (L) 08/30/2019 1117   PHURINE 5.5 08/30/2019 1117   GLUCOSEU >=500 (A) 08/30/2019 1117   HGBUR NEGATIVE  08/30/2019 1117   BILIRUBINUR NEGATIVE 08/30/2019 1117   KETONESUR NEGATIVE 08/30/2019 1117   PROTEINUR NEGATIVE 08/30/2019 1117   UROBILINOGEN 0.2 05/15/2013 2130   NITRITE NEGATIVE 08/30/2019 1117   LEUKOCYTESUR NEGATIVE 08/30/2019 1117    Radiological Exams on Admission: DG Chest Portable 1 View  Result Date: 08/30/2019 CLINICAL DATA:  Shortness of breath and cough. Generalized weakness. EXAM: PORTABLE CHEST 1 VIEW COMPARISON:  09/01/2018 FINDINGS: There is a right chest wall port a catheter with tip at the cavoatrial junction. Cardiac enlargement. Pulmonary vascular congestion. No airspace consolidation. No pleural effusion noted. IMPRESSION: Cardiac enlargement and pulmonary vascular congestion. Unchanged from previous exam. Electronically Signed   By: Kerby Moors M.D.   On: 08/30/2019 12:01    EKG: Independently reviewed. Sinus rhythm, early repolarization changes V2 with slight peaking T waves.  Prior EKG showed sinus tachycardia with low voltage throughout.  Assessment/Plan Active Problems:   Sickle cell anemia (HCC)   Essential hypertension   Hyperkalemia   Hyperosmolar hyperglycemic state (HHS) (HCC)   Chronic right-sided heart failure (HCC)   Pulmonary hypertension (HCC)   CKD (chronic kidney disease) stage 3, GFR 30-59 ml/min  Pamela Hudson is a 34 y.o. female with medical history significant for pulmonary hypertension with chronic right-sided heart failure, CKD stage III, sickle cell disease with anemia, and hypertension who is admitted with severe hyperglycemia/HHS.  Severe hyperglycemia/HHS: Without prior known history of diabetes. -Continue insulin infusion per protocol -Continue IV NS 75 mL/hr transition to D5-1/2 NS at same rate when CBG below 250 -Repeat BMP now and monitor q4h overnight -Keep n.p.o. -Check A1c  Hyperkalemia: K 5.8 on ED arrival.  Anticipate improvement with IV insulin and fluids.  Repeat BMP now and if still elevated will treat.  Hold home  potassium supplement and Toprol-XL.  Transaminitis/elevated lipase: T bili 9.5, lipase 219 with mild elevations in AST/ALT/alk phos.  Patient reported some abdominal pain to nursing.  Prior abdominal ultrasound showed previous cholecystectomy. -Obtain RUQ ultrasound -Continue to monitor labs  Pulmonary hypertension with chronic right-sided heart failure: Echocardiogram 06/15/2019 showed EF 55-60%, mildly dilated RV and mild pulmonary hypertension.  Patient currently appears euvolemic to mildly hypovolemic. -Monitor strict I/O's and daily weights -Holding home torsemide for now  CKD stage III: Chronic and appears relatively stable.  Continue to monitor.  Hypertension: Holding home Toprol-XL and Lasix due to soft blood pressure.  Sickle cell disease: Continue hydroxyurea.  Anemia of chronic disease: Hemoglobin 7.4 on admission.  Previous documentation of such as baseline hemoglobin around 6.8 and history of iron overload from chronic  transfusions. -Check anemia panel -Continue to monitor, transfuse as needed  DVT prophylaxis: Subcutaneous heparin Code Status: Full code Family Communication: None available on admission Disposition Plan: From home and likely discharge home pending adequate control of hyperglycemia Consults called: None Admission status:  Status is: Inpatient  Remains inpatient appropriate because:Persistent severe electrolyte disturbances, Altered mental status and IV treatments appropriate due to intensity of illness or inability to take PO  Dispo: The patient is from: Home              Anticipated d/c is to: Home              Anticipated d/c date is: 2 days              Patient currently is not medically stable to d/c.   Zada Finders MD [facility]  If 7PM-7AM, please contact night-coverage [email or web address]  08/30/2019, 8:08 PM

## 2019-08-30 NOTE — ED Notes (Signed)
Insulin drip remains at 11

## 2019-08-30 NOTE — Progress Notes (Signed)
Pt arrived on the unit from [facility]. Pt AOx4. Admissions paged.

## 2019-08-30 NOTE — ED Notes (Signed)
Pt had been incontient of urine, pt cleaned up bed changed and BMET drawn

## 2019-08-30 NOTE — ED Triage Notes (Signed)
Generalized weakness, increased thirst for several days. Hx of sickle cell. Concerned that her hgb may be low.

## 2019-08-30 NOTE — ED Notes (Signed)
Pt signed consent to transfere

## 2019-08-30 NOTE — ED Notes (Signed)
Insulin tool left at 11 cc/hour per endo tool

## 2019-08-30 NOTE — ED Notes (Signed)
Pt given water and she used bedpan to pee

## 2019-08-30 NOTE — ED Notes (Signed)
PA into speak to pts brother endo tool started insulin drip at 11

## 2019-08-30 NOTE — ED Provider Notes (Signed)
[facility] EMERGENCY DEPARTMENT Provider Note   CSN: [id number] Arrival date & time: 08/30/19  1055     History Chief Complaint  Patient presents with   Weakness   Hyperglycemia    Pamela Hudson is a 34 y.o. female.  HPI  Patient is a 34 year old female with past medical history of CHF with most recent EF within normal limits approximately 1 year ago, history of right-sided HF with some pulmonary hypertension, hypertension, chronic pain, sickle cell anemia, liver disease.   Patient presents today with complaints of generalized weakness, increased thirst for several days, history of sickle cell disease that has required transfusions in the past.  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  She states that she has been peeing quite frequently but denies any burning/dysuria.  She states that she has had intermittent general abdominal pain but no nausea or vomiting.  She denies any chest pain or shortness of breath cough lightheadedness or dizziness.  She states she feels generally fatigued though.   She has taken nothing for her symptoms prior to arrival.  She states that she has been drinking water but has had very little appetite.   She also endorses sore throat denies any cough or chest pain.    Past Medical History:  Diagnosis Date   Cardiomegaly    CHF (congestive heart failure) (HCC)    Chronic pain    CVA (cerebral infarction) 02/2002   Hypertension    Liver disease    ?iron   PCOS (polycystic ovarian syndrome)    Renal stone    Sickle cell anemia (HCC)     Patient Active Problem List   Diagnosis Date Noted   Diabetes mellitus due to underlying condition with hyperosmolarity without coma, without long-term current use of insulin ([place])    Hyperosmolar hyperglycemic state (HHS) ([place])  08/30/2019   Chronic right-sided heart failure (HCC)    Pulmonary hypertension (HCC)    CKD (chronic kidney disease) stage 3, GFR 30-59 ml/min    Other ascites    Transaminitis 08/31/2018   Acute on chronic right-sided heart failure ([place]) 08/30/2018   Acute exacerbation of CHF (congestive heart failure) ([place]) 07/09/2018   Anemia in other chronic diseases classified elsewhere 07/09/2018   Hyperkalemia 03/25/2018   Acute respiratory failure with hypoxia ([place]) 02/17/2018   Essential hypertension 02/17/2018   Anemia 02/17/2018   Dyspnea 02/16/2018   Gastroenteritis 05/28/2017   Dehydration 05/28/2017   AKI (acute kidney injury) ([place]) 05/28/2017   Sickle cell anemia with crisis ([place]) 05/28/2017   Hemorrhagic ovarian cyst 12/26/2016   Hemorrhagic cyst of ovary 12/26/2016   Pneumonia 11/26/2015   Sickle cell anemia (HCC) 11/26/2015   Elevated bilirubin 11/26/2015   Serum calcium elevated 11/26/2015   Hyponatremia 11/26/2015   Leukocytosis 11/26/2015   CVA (cerebral infarction) 02/13/2012    Past Surgical History:  Procedure Laterality Date   CHOLECYSTECTOMY     LIVER BIOPSY  2007   PARATHYROIDECTOMY     PORTACATH PLACEMENT     RIGHT HEART CATH N/A 09/01/2018   Procedure: RIGHT HEART CATH;  Surgeon: Adrian Prows, MD;  Location: [facility];  Service: Cardiovascular;  Laterality: N/A;     OB History   No obstetric history on file.     Family History  Problem Relation Age of Onset  Diabetes Mellitus II Father    CAD Father    Diabetes Mellitus II Paternal Grandmother     Social History   Tobacco Use   Smoking status: Former Smoker   Smokeless tobacco: Never Used  Scientific laboratory technician Use: Never used  Substance Use Topics   Alcohol use: Yes    Comment: occ   Drug use: No    Home Medications Prior to Admission medications   Medication Sig Start Date End Date Taking? Authorizing Provider  cyclobenzaprine (FLEXERIL) 10 MG  tablet Take 10 mg by mouth 3 (three) times daily as needed for muscle spasms.   Yes [provider]  Deferasirox (JADENU) 360 MG TABS Take 1,080 mg by mouth at bedtime.    Yes [provider]  Etonogestrel (IMPLANON [place]) Inject 1 each into the skin once. Implanted spring 2019   Yes [provider]  folic acid (FOLVITE) 1 MG tablet Take 1 mg by mouth daily. 11/21/15  Yes [provider]  hydroxyurea (HYDREA) 500 MG capsule Take 1,000 mg by mouth daily.    Yes [provider]  magnesium oxide (MAG-OX) 400 MG tablet Take 800 mg by mouth daily.  01/26/18  Yes [provider]  metoprolol succinate (TOPROL XL) 25 MG 24 hr tablet Take 0.5 tablets (12.5 mg total) by mouth daily. 11/29/15  Yes Jegede, Marlena Clipper, MD  potassium chloride SA (K-DUR) 20 MEQ tablet Take 1 tablet (20 mEq total) by mouth daily. 09/11/18  Yes Clegg, Amy D, NP  torsemide (DEMADEX) 20 MG tablet Take 2 tablets (40 mg total) by mouth 2 (two) times daily. 09/21/18  Yes Clegg, Amy D, NP  Vitamin D, Ergocalciferol, (DRISDOL) 1.25 MG (50000 UT) CAPS capsule Take 100,000 Units by mouth every Sunday.  01/26/18  Yes [provider]  OXBRYTA 500 MG TABS tablet Take 3 tablets by mouth at bedtime.  08/28/19   [provider]    Allergies    Oxycodone-acetaminophen and Prednisone  Review of Systems   Review of Systems  Constitutional: Positive for fatigue. Negative for chills and fever.  HENT: Negative for congestion.   Eyes: Negative for pain.  Respiratory: Negative for cough and shortness of breath.   Cardiovascular: Negative for chest pain and leg swelling.  Gastrointestinal: Positive for abdominal pain. Negative for diarrhea, nausea and vomiting.  Genitourinary: Negative for dysuria.  Musculoskeletal: Negative for myalgias.  Skin: Negative for rash.  Neurological: Positive for weakness. Negative for dizziness and headaches.    Physical Exam Updated Vital Signs BP  (!) 97/56    Pulse 95    Temp 98.8 F (37.1 C) (Oral)    Resp 18    Ht '4\' 11"'  (1.499 m)    Wt 64.6 kg Comment: scale c   SpO2 100%    BMI 28.78 kg/m   Physical Exam Vitals and nursing note reviewed.  Constitutional:      General: She is not in acute distress.    Comments: Patient is pleasant 34 year old female appears somewhat older than stated age.  In no acute distress.  No increased work of breathing.  Does appear somewhat fatigued.  HENT:     Head: Normocephalic and atraumatic.     Nose: Nose normal.     Mouth/Throat:     Mouth: Mucous membranes are dry.  Eyes:     General: No scleral icterus. Cardiovascular:     Rate and Rhythm: Normal rate and regular rhythm.     Pulses: Normal  pulses.     Heart sounds: Normal heart sounds.  Pulmonary:     Effort: Pulmonary effort is normal. No respiratory distress.     Breath sounds: Normal breath sounds. No wheezing.     Comments: Right-sided chest with port in place. Abdominal:     Palpations: Abdomen is soft.     Tenderness: There is no abdominal tenderness. There is no right CVA tenderness, left CVA tenderness, guarding or rebound.     Comments: No significant tenderness in the abdomen with light and deep palpation.  No CVA tenderness.  No guarding or rebound.  Musculoskeletal:     Cervical back: Normal range of motion.     Right lower leg: No edema.     Left lower leg: No edema.  Skin:    General: Skin is warm and dry.     Capillary Refill: Capillary refill takes less than 2 seconds.  Neurological:     Mental Status: She is alert. Mental status is at baseline.     Comments: AOx3, able answer questions properly follow commands.  Moves all 4 extremities.  Strength 5-/5 in all 4 extremities. Cranial nerves intact  Psychiatric:        Mood and Affect: Mood normal.        Behavior: Behavior normal.     ED Results / Procedures / Treatments   Labs (all labs ordered are listed, but only abnormal results are displayed) Labs Reviewed   URINALYSIS, ROUTINE W REFLEX MICROSCOPIC - Abnormal; Notable for the following components:      Result Value   Specific Gravity, Urine <1.005 (*)    Glucose, UA >=500 (*)    All other components within normal limits  COMPREHENSIVE METABOLIC PANEL - Abnormal; Notable for the following components:   Sodium 124 (*)    Potassium 5.8 (*)    Chloride 86 (*)    Glucose, Bld 1,185 (*)    BUN 66 (*)    Creatinine, Ser 1.87 (*)    Calcium 8.4 (*)    Total Protein 9.7 (*)    Albumin 3.4 (*)    AST 50 (*)    ALT 92 (*)    Alkaline Phosphatase 181 (*)    Total Bilirubin 9.5 (*)    GFR calc non Af Amer 35 (*)    GFR calc Af Amer 41 (*)    All other components within normal limits  LIPASE, BLOOD - Abnormal; Notable for the following components:   Lipase 219 (*)    All other components within normal limits  CBC WITH DIFFERENTIAL/PLATELET - Abnormal; Notable for the following components:   WBC 29.7 (*)    RBC 1.84 (*)    Hemoglobin 7.2 (*)    HCT 20.8 (*)    MCV 113.0 (*)    MCH 39.1 (*)    RDW 18.1 (*)    Neutro Abs 25.2 (*)    Monocytes Absolute 2.4 (*)    All other components within normal limits  URINALYSIS, MICROSCOPIC (REFLEX) - Abnormal; Notable for the following components:   Bacteria, UA FEW (*)    All other components within normal limits  OSMOLALITY - Abnormal; Notable for the following components:   Osmolality 364 (*)    All other components within normal limits  GLUCOSE, CAPILLARY - Abnormal; Notable for the following components:   Glucose-Capillary 308 (*)    All other components within normal limits  BASIC METABOLIC PANEL - Abnormal; Notable for the following components:   Potassium  5.4 (*)    Glucose, Bld 321 (*)    BUN 56 (*)    Creatinine, Ser 1.79 (*)    GFR calc non Af Amer 37 (*)    GFR calc Af Amer 43 (*)    All other components within normal limits  BASIC METABOLIC PANEL - Abnormal; Notable for the following components:   Glucose, Bld 195 (*)    BUN 54  (*)    Creatinine, Ser 1.63 (*)    GFR calc non Af Amer 41 (*)    GFR calc Af Amer 48 (*)    All other components within normal limits  BASIC METABOLIC PANEL - Abnormal; Notable for the following components:   Glucose, Bld 200 (*)    BUN 55 (*)    Creatinine, Ser 1.77 (*)    GFR calc non Af Amer 37 (*)    GFR calc Af Amer 43 (*)    All other components within normal limits  BASIC METABOLIC PANEL - Abnormal; Notable for the following components:   Glucose, Bld 228 (*)    BUN 53 (*)    Creatinine, Ser 1.61 (*)    GFR calc non Af Amer 42 (*)    GFR calc Af Amer 49 (*)    All other components within normal limits  HEMOGLOBIN A1C - Abnormal; Notable for the following components:   Hgb A1c MFr Bld 7.5 (*)    All other components within normal limits  VITAMIN B12 - Abnormal; Notable for the following components:   Vitamin B-12 1,287 (*)    All other components within normal limits  IRON AND TIBC - Abnormal; Notable for the following components:   TIBC 143 (*)    Saturation Ratios 114 (*)    All other components within normal limits  FERRITIN - Abnormal; Notable for the following components:   Ferritin >7,500 (*)    All other components within normal limits  RETICULOCYTES - Abnormal; Notable for the following components:   Retic Ct Pct 5.1 (*)    RBC. 1.99 (*)    All other components within normal limits  LIPID PANEL - Abnormal; Notable for the following components:   HDL 16 (*)    LDL Cholesterol 135 (*)    All other components within normal limits  CBC - Abnormal; Notable for the following components:   WBC 32.0 (*)    RBC 1.83 (*)    Hemoglobin 7.1 (*)    HCT 19.5 (*)    MCV 106.6 (*)    MCH 38.8 (*)    MCHC 36.4 (*)    RDW 16.2 (*)    nRBC 0.3 (*)    All other components within normal limits  HEPATIC FUNCTION PANEL - Abnormal; Notable for the following components:   Total Protein 9.2 (*)    Albumin 3.0 (*)    AST 51 (*)    ALT 78 (*)    Alkaline Phosphatase 136 (*)     Total Bilirubin 7.5 (*)    Bilirubin, Direct 2.2 (*)    Indirect Bilirubin 5.3 (*)    All other components within normal limits  GLUCOSE, CAPILLARY - Abnormal; Notable for the following components:   Glucose-Capillary 298 (*)    All other components within normal limits  GLUCOSE, CAPILLARY - Abnormal; Notable for the following components:   Glucose-Capillary 277 (*)    All other components within normal limits  GLUCOSE, CAPILLARY - Abnormal; Notable for the following components:   Glucose-Capillary 309 (*)  All other components within normal limits  BASIC METABOLIC PANEL - Abnormal; Notable for the following components:   Sodium 134 (*)    Glucose, Bld 417 (*)    BUN 54 (*)    Creatinine, Ser 1.67 (*)    Calcium 8.7 (*)    GFR calc non Af Amer 40 (*)    GFR calc Af Amer 46 (*)    All other components within normal limits  GLUCOSE, CAPILLARY - Abnormal; Notable for the following components:   Glucose-Capillary 191 (*)    All other components within normal limits  GLUCOSE, CAPILLARY - Abnormal; Notable for the following components:   Glucose-Capillary 149 (*)    All other components within normal limits  GLUCOSE, CAPILLARY - Abnormal; Notable for the following components:   Glucose-Capillary 162 (*)    All other components within normal limits  GLUCOSE, CAPILLARY - Abnormal; Notable for the following components:   Glucose-Capillary 159 (*)    All other components within normal limits  GLUCOSE, CAPILLARY - Abnormal; Notable for the following components:   Glucose-Capillary 168 (*)    All other components within normal limits  GLUCOSE, CAPILLARY - Abnormal; Notable for the following components:   Glucose-Capillary 179 (*)    All other components within normal limits  GLUCOSE, CAPILLARY - Abnormal; Notable for the following components:   Glucose-Capillary 180 (*)    All other components within normal limits  HEMOGLOBIN A1C - Abnormal; Notable for the following components:     Hgb A1c MFr Bld 7.8 (*)    All other components within normal limits  GLUCOSE, CAPILLARY - Abnormal; Notable for the following components:   Glucose-Capillary 198 (*)    All other components within normal limits  GLUCOSE, CAPILLARY - Abnormal; Notable for the following components:   Glucose-Capillary 405 (*)    All other components within normal limits  GLUCOSE, CAPILLARY - Abnormal; Notable for the following components:   Glucose-Capillary 353 (*)    All other components within normal limits  GLUCOSE, CAPILLARY - Abnormal; Notable for the following components:   Glucose-Capillary 151 (*)    All other components within normal limits  GLUCOSE, CAPILLARY - Abnormal; Notable for the following components:   Glucose-Capillary 245 (*)    All other components within normal limits  GLUCOSE, CAPILLARY - Abnormal; Notable for the following components:   Glucose-Capillary 261 (*)    All other components within normal limits  GLUCOSE, CAPILLARY - Abnormal; Notable for the following components:   Glucose-Capillary 163 (*)    All other components within normal limits  GLUCOSE, CAPILLARY - Abnormal; Notable for the following components:   Glucose-Capillary 291 (*)    All other components within normal limits  CBC - Abnormal; Notable for the following components:   WBC 26.9 (*)    RBC 1.71 (*)    Hemoglobin 6.6 (*)    HCT 18.3 (*)    MCV 107.0 (*)    MCH 38.6 (*)    MCHC 36.1 (*)    RDW 16.5 (*)    nRBC 1.0 (*)    All other components within normal limits  COMPREHENSIVE METABOLIC PANEL - Abnormal; Notable for the following components:   Sodium 133 (*)    Glucose, Bld 187 (*)    BUN 35 (*)    Creatinine, Ser 1.40 (*)    Total Protein 8.4 (*)    Albumin 2.8 (*)    AST 77 (*)    ALT 79 (*)  Total Bilirubin 11.5 (*)    GFR calc non Af Amer 50 (*)    GFR calc Af Amer 57 (*)    All other components within normal limits  GLUCOSE, CAPILLARY - Abnormal; Notable for the following  components:   Glucose-Capillary 114 (*)    All other components within normal limits  GLUCOSE, CAPILLARY - Abnormal; Notable for the following components:   Glucose-Capillary 233 (*)    All other components within normal limits  DIC (DISSEMINATED INTRAVASCULAR COAGULATION) PANEL - Abnormal; Notable for the following components:   Prothrombin Time 16.0 (*)    INR 1.3 (*)    aPTT 65 (*)    D-Dimer, Quant 2.64 (*)    All other components within normal limits  ANTI-SMOOTH MUSCLE ANTIBODY, IGG - Abnormal; Notable for the following components:   F-Actin IgG 26 (*)    All other components within normal limits  GLUCOSE, CAPILLARY - Abnormal; Notable for the following components:   Glucose-Capillary 238 (*)    All other components within normal limits  GLUCOSE, CAPILLARY - Abnormal; Notable for the following components:   Glucose-Capillary 171 (*)    All other components within normal limits  CBC WITH DIFFERENTIAL/PLATELET - Abnormal; Notable for the following components:   WBC 24.7 (*)    RBC 1.96 (*)    Hemoglobin 7.2 (*)    HCT 20.2 (*)    MCV 103.1 (*)    MCH 36.7 (*)    RDW 18.2 (*)    nRBC 0.8 (*)    Neutro Abs 17.8 (*)    Lymphs Abs 4.1 (*)    Monocytes Absolute 2.0 (*)    Abs Immature Granulocytes 0.22 (*)    All other components within normal limits  GLUCOSE, CAPILLARY - Abnormal; Notable for the following components:   Glucose-Capillary 164 (*)    All other components within normal limits  GLUCOSE, CAPILLARY - Abnormal; Notable for the following components:   Glucose-Capillary 201 (*)    All other components within normal limits  GLUCOSE, CAPILLARY - Abnormal; Notable for the following components:   Glucose-Capillary 176 (*)    All other components within normal limits  GLUCOSE, CAPILLARY - Abnormal; Notable for the following components:   Glucose-Capillary 168 (*)    All other components within normal limits  CBG MONITORING, ED - Abnormal; Notable for the following  components:   Glucose-Capillary >600 (*)    All other components within normal limits  CBG MONITORING, ED - Abnormal; Notable for the following components:   Glucose-Capillary >600 (*)    All other components within normal limits  CBG MONITORING, ED - Abnormal; Notable for the following components:   Glucose-Capillary >600 (*)    All other components within normal limits  CBG MONITORING, ED - Abnormal; Notable for the following components:   Glucose-Capillary >600 (*)    All other components within normal limits  CBG MONITORING, ED - Abnormal; Notable for the following components:   Glucose-Capillary >600 (*)    All other components within normal limits  CBG MONITORING, ED - Abnormal; Notable for the following components:   Glucose-Capillary >600 (*)    All other components within normal limits  CBG MONITORING, ED - Abnormal; Notable for the following components:   Glucose-Capillary 589 (*)    All other components within normal limits  CBG MONITORING, ED - Abnormal; Notable for the following components:   Glucose-Capillary 505 (*)    All other components within normal limits  GROUP A STREP  BY PCR  SARS CORONAVIRUS 2 BY RT PCR (HOSPITAL ORDER, PERFORMED IN [facility])  PREGNANCY, URINE  MONONUCLEOSIS SCREEN  FOLATE  GLUCOSE, CAPILLARY  HEPATITIS PANEL, ACUTE  ANA  MITOCHONDRIAL ANTIBODIES  TSH  OCCULT BLOOD X 1 CARD TO LAB, STOOL  ALPHA-1-ANTITRYPSIN  CERULOPLASMIN  COMPREHENSIVE METABOLIC PANEL  TYPE AND SCREEN  PREPARE RBC (CROSSMATCH)  PREPARE RBC (CROSSMATCH)    EKG EKG Interpretation  Date/Time:  Thursday August 30 2019 12:11:33 EDT Ventricular Rate:  79 PR Interval:    QRS Duration: 117 QT Interval:  375 QTC Calculation: 430 R Axis:   34 Text Interpretation: Sinus rhythm Consider right atrial enlargement Nonspecific intraventricular conduction delay ST elev, probable normal early repol pattern Confirmed by Elnora Morrison [phone]) on 08/31/2019  6:07:31 PM   Radiology No results found.  Procedures .Critical Care Performed by: Tedd Sias, PA Authorized by: Tedd Sias, PA   Critical care provider statement:    Critical care time (minutes):  45   Critical care time was exclusive of:  Separately billable procedures and treating other patients and teaching time   Critical care was necessary to treat or prevent imminent or life-threatening deterioration of the following conditions:  Metabolic crisis (HHS; metabolic crisis)   Critical care was time spent personally by me on the following activities:  Discussions with consultants, evaluation of patient's response to treatment, examination of patient, ordering and performing treatments and interventions, ordering and review of laboratory studies, ordering and review of radiographic studies, pulse oximetry, re-evaluation of patient's condition, obtaining history from patient or surrogate and review of old charts   I assumed direction of critical care for this patient from another provider in my specialty: no     (including critical care time)  Medications Ordered in ED Medications  0.9 %  sodium chloride infusion (500 mLs Intravenous New Bag/Given 08/30/19 1421)  0.9 %  sodium chloride infusion (has no administration in time range)  heparin injection 5,000 Units (5,000 Units Subcutaneous Given 09/03/19 0643)  dextrose 50 % solution 0-50 mL (has no administration in time range)  hydroxyurea (HYDREA) capsule 1,000 mg (1,000 mg Oral Given 7/67/20 9470)  folic acid (FOLVITE) tablet 1 mg (1 mg Oral Given 09/03/19 0953)  sodium chloride flush (NS) 0.9 % injection 10-40 mL (has no administration in time range)  Chlorhexidine Gluconate Cloth 2 % PADS 6 each (6 each Topical Given 09/03/19 0957)  ondansetron (ZOFRAN) injection 4 mg (4 mg Intravenous Given 08/31/19 2156)  insulin aspart (novoLOG) injection 5 Units (5 Units Subcutaneous Given 09/03/19 1213)  insulin aspart (novoLOG)  injection 0-20 Units (4 Units Subcutaneous Given 09/03/19 1212)  insulin starter kit- pen needles (English) 1 kit (has no administration in time range)  diltiazem (CARDIZEM) 1 mg/mL load via infusion 10 mg (has no administration in time range)  digoxin (LANOXIN) tablet 0.125 mg (0.125 mg Oral Given 09/03/19 0950)  nystatin (MYCOSTATIN) 100000 UNIT/ML suspension 500,000 Units (500,000 Units Oral Given 09/03/19 0950)  insulin glargine (LANTUS) injection 30 Units (30 Units Subcutaneous Given 09/02/19 2227)  acetaminophen (TYLENOL) tablet 500 mg (500 mg Oral Given 09/02/19 1819)  0.9 %  sodium chloride infusion (Manually program via Guardrails IV Fluids) (has no administration in time range)  0.9 %  sodium chloride infusion (Manually program via Guardrails IV Fluids) (has no administration in time range)  ferumoxytol (FERAHEME) 510 mg in sodium chloride 0.9 % 100 mL IVPB (has no administration in time range)  sodium chloride 0.9 %  bolus 1,000 mL (0 mLs Intravenous Stopped 08/30/19 1310)  lactated ringers bolus 1,000 mL (0 mLs Intravenous Stopped 08/30/19 1310)  sodium zirconium cyclosilicate (LOKELMA) packet 10 g (10 g Oral Given 08/31/19 0009)  insulin glargine (LANTUS) injection 8 Units (8 Units Subcutaneous Given 08/31/19 1009)  diltiazem (CARDIZEM) injection 10 mg (10 mg Intravenous Given 08/31/19 1035)  living well with diabetes book MISC ( Does not apply Given 08/31/19 1715)  sodium chloride 0.9 % bolus 500 mL (0 mLs Intravenous Stopping Infusion hung by another clincian 09/01/19 0803)  digoxin (LANOXIN) 0.25 MG/ML injection 0.25 mg (0.25 mg Intravenous Given 09/01/19 1300)  diphenhydrAMINE (BENADRYL) capsule 25 mg (25 mg Oral Given 09/02/19 2228)    ED Course  I have reviewed the triage vital signs and the nursing notes.  Pertinent labs & imaging results that were available during my care of the patient were reviewed by me and considered in my medical decision making (see chart for details).  Patient  is a 34 year old female with past medical history detailed below presented today for generalized weakness, thirst, increased urination, fatigue.  Physical exam was relatively unremarkable apart from dry oral mucosa.  She is found to have a elevated blood sugar of 1185 no prior history of diabetes but did recently finish steroid medications for her left ankle although she states she is not sure which steroid medication she states that she took her last dose on Monday. Patient has nonketotic/nonacidotic elevated blood sugar consistent with honk/HHS.  She has elevated BUN and creatinine although not significantly elevated from her baseline.  Potassium is elevated at 5.8 will begin patient on insulin drip admit to hospital service.  She does have leukocytosis with WBC of 30 this may be secondary to physiologic stress VS steroid use.  Doubt acute infection and she has no localizing symptoms.  She is otherwise well-appearing.  Covid test negative, mono strep, negative, lipase elevated perhaps secondary to dehydration.  Hemoglobin at 7.2.  Is negative for ketones.  Patient is not in DKA.  She is in HHS.  Given patient has a history of pulmonary vascular congestion/pulmonary hypertension will be cautious with fluids.  I discussed this case with my attending physician who cosigned this note including patient's presenting symptoms, physical exam, and planned diagnostics and interventions. Attending physician stated agreement with plan or made changes to plan which were implemented.   Attending physician assessed patient at bedside.  Patient transfused with RBCs and started on insulin drip.  Clinical Course as of Sep 02 1408  Thu Aug 30, 2019  1325 Discussed with Neil Crouch of [facility]. She is available for consult from hospitalists if needed.    [WF]  1338 Discussed with Nicole Kindred of [facility] hospitalists who will admit. Insulin driip started per endotool. Pt updated on all labs/results/interventions and  need for hospitalization.    [WF]    Clinical Course User Index [WF] Tedd Sias, PA   MDM Rules/Calculators/A&P                          Patient here to hospital for new onset diabetes, fatigue, anemia  Final Clinical Impression(s) / ED Diagnoses Final diagnoses:  Transaminitis  Diabetes mellitus due to underlying condition with hyperosmolarity without coma, without long-term current use of insulin ([place])    Rx / DC Orders ED Discharge Orders         Ordered    Amb Referral to Nutrition and Diabetic E  Discontinue  Reprint     08/31/19 [place], Kristina Bertone S, [place] 09/03/19 1414    Gareth Morgan, MD 09/05/19 2042

## 2019-08-31 DIAGNOSIS — E0800 Diabetes mellitus due to underlying condition with hyperosmolarity without nonketotic hyperglycemic-hyperosmolar coma (NKHHC): Secondary | ICD-10-CM

## 2019-08-31 DIAGNOSIS — I50812 Chronic right heart failure: Secondary | ICD-10-CM

## 2019-08-31 DIAGNOSIS — I10 Essential (primary) hypertension: Secondary | ICD-10-CM

## 2019-08-31 DIAGNOSIS — R7401 Elevation of levels of liver transaminase levels: Secondary | ICD-10-CM

## 2019-08-31 LAB — BASIC METABOLIC PANEL
Anion gap: 10 (ref 5–15)
Anion gap: 9 (ref 5–15)
Anion gap: 9 (ref 5–15)
Anion gap: 7 (ref 5–15)
BUN: 54 mg/dL — ABNORMAL HIGH (ref 6–20)
BUN: 55 mg/dL — ABNORMAL HIGH (ref 6–20)
BUN: 53 mg/dL — ABNORMAL HIGH (ref 6–20)
BUN: 54 mg/dL — ABNORMAL HIGH (ref 6–20)
CO2: 27 mmol/L (ref 22–32)
CO2: 28 mmol/L (ref 22–32)
CO2: 27 mmol/L (ref 22–32)
CO2: 26 mmol/L (ref 22–32)
Calcium: 9 mg/dL (ref 8.9–10.3)
Calcium: 9.1 mg/dL (ref 8.9–10.3)
Calcium: 8.9 mg/dL (ref 8.9–10.3)
Calcium: 8.7 mg/dL — ABNORMAL LOW (ref 8.9–10.3)
Chloride: 103 mmol/L (ref 98–111)
Chloride: 104 mmol/L (ref 98–111)
Chloride: 104 mmol/L (ref 98–111)
Chloride: 101 mmol/L (ref 98–111)
Creatinine, Ser: 1.63 mg/dL — ABNORMAL HIGH (ref 0.44–1.00)
Creatinine, Ser: 1.77 mg/dL — ABNORMAL HIGH (ref 0.44–1.00)
Creatinine, Ser: 1.61 mg/dL — ABNORMAL HIGH (ref 0.44–1.00)
Creatinine, Ser: 1.67 mg/dL — ABNORMAL HIGH (ref 0.44–1.00)
GFR calc Af Amer: 48 mL/min — ABNORMAL LOW (ref >60)
GFR calc Af Amer: 43 mL/min — ABNORMAL LOW (ref >60)
GFR calc Af Amer: 49 mL/min — ABNORMAL LOW (ref >60)
GFR calc Af Amer: 46 mL/min — ABNORMAL LOW (ref >60)
GFR calc non Af Amer: 41 mL/min — ABNORMAL LOW (ref >60)
GFR calc non Af Amer: 37 mL/min — ABNORMAL LOW (ref >60)
GFR calc non Af Amer: 42 mL/min — ABNORMAL LOW (ref >60)
GFR calc non Af Amer: 40 mL/min — ABNORMAL LOW (ref >60)
Glucose, Bld: 195 mg/dL — ABNORMAL HIGH (ref 70–99)
Glucose, Bld: 200 mg/dL — ABNORMAL HIGH (ref 70–99)
Glucose, Bld: 228 mg/dL — ABNORMAL HIGH (ref 70–99)
Glucose, Bld: 417 mg/dL — ABNORMAL HIGH (ref 70–99)
Potassium: 4.8 mmol/L (ref 3.5–5.1)
Potassium: 4.4 mmol/L (ref 3.5–5.1)
Potassium: 4.2 mmol/L (ref 3.5–5.1)
Potassium: 4.4 mmol/L (ref 3.5–5.1)
Sodium: 140 mmol/L (ref 135–145)
Sodium: 141 mmol/L (ref 135–145)
Sodium: 140 mmol/L (ref 135–145)
Sodium: 134 mmol/L — ABNORMAL LOW (ref 135–145)

## 2019-08-31 LAB — HEPATIC FUNCTION PANEL
ALT: 78 U/L — ABNORMAL HIGH (ref 0–44)
AST: 51 U/L — ABNORMAL HIGH (ref 15–41)
Albumin: 3 g/dL — ABNORMAL LOW (ref 3.5–5.0)
Alkaline Phosphatase: 136 U/L — ABNORMAL HIGH (ref 38–126)
Bilirubin, Direct: 2.2 mg/dL — ABNORMAL HIGH (ref 0.0–0.2)
Indirect Bilirubin: 5.3 mg/dL — ABNORMAL HIGH (ref 0.3–0.9)
Total Bilirubin: 7.5 mg/dL — ABNORMAL HIGH (ref 0.3–1.2)
Total Protein: 9.2 g/dL — ABNORMAL HIGH (ref 6.5–8.1)

## 2019-08-31 LAB — GLUCOSE, CAPILLARY
Glucose-Capillary: 149 mg/dL — ABNORMAL HIGH (ref 70–99)
Glucose-Capillary: 151 mg/dL — ABNORMAL HIGH (ref 70–99)
Glucose-Capillary: 159 mg/dL — ABNORMAL HIGH (ref 70–99)
Glucose-Capillary: 162 mg/dL — ABNORMAL HIGH (ref 70–99)
Glucose-Capillary: 168 mg/dL — ABNORMAL HIGH (ref 70–99)
Glucose-Capillary: 179 mg/dL — ABNORMAL HIGH (ref 70–99)
Glucose-Capillary: 180 mg/dL — ABNORMAL HIGH (ref 70–99)
Glucose-Capillary: 191 mg/dL — ABNORMAL HIGH (ref 70–99)
Glucose-Capillary: 198 mg/dL — ABNORMAL HIGH (ref 70–99)
Glucose-Capillary: 245 mg/dL — ABNORMAL HIGH (ref 70–99)
Glucose-Capillary: 353 mg/dL — ABNORMAL HIGH (ref 70–99)
Glucose-Capillary: 405 mg/dL — ABNORMAL HIGH (ref 70–99)

## 2019-08-31 LAB — CBC
HCT: 19.5 % — ABNORMAL LOW (ref 36.0–46.0)
Hemoglobin: 7.1 g/dL — ABNORMAL LOW (ref 12.0–15.0)
MCH: 38.8 pg — ABNORMAL HIGH (ref 26.0–34.0)
MCHC: 36.4 g/dL — ABNORMAL HIGH (ref 30.0–36.0)
MCV: 106.6 fL — ABNORMAL HIGH (ref 80.0–100.0)
Platelets: 268 10*3/uL (ref 150–400)
RBC: 1.83 MIL/uL — ABNORMAL LOW (ref 3.87–5.11)
RDW: 16.2 % — ABNORMAL HIGH (ref 11.5–15.5)
WBC: 32 10*3/uL — ABNORMAL HIGH (ref 4.0–10.5)
nRBC: 0.3 % — ABNORMAL HIGH (ref 0.0–0.2)

## 2019-08-31 LAB — LIPID PANEL
Cholesterol: 170 mg/dL (ref 0–200)
HDL: 16 mg/dL — ABNORMAL LOW (ref >40)
LDL Cholesterol: 135 mg/dL — ABNORMAL HIGH (ref 0–99)
Total CHOL/HDL Ratio: 10.6 RATIO
Triglycerides: 96 mg/dL (ref <150)
VLDL: 19 mg/dL (ref 0–40)

## 2019-08-31 LAB — HEMOGLOBIN A1C
Hgb A1c MFr Bld: 7.5 % — ABNORMAL HIGH (ref 4.8–5.6)
Mean Plasma Glucose: 169 mg/dL

## 2019-08-31 MED ORDER — SODIUM CHLORIDE 0.9 % IV BOLUS
500.0000 mL | Freq: Once | INTRAVENOUS | Status: AC
  Administered 2019-08-31: 500 mL via INTRAVENOUS

## 2019-08-31 MED ORDER — ONDANSETRON HCL 4 MG/2ML IJ SOLN
4.0000 mg | Freq: Four times a day (QID) | INTRAMUSCULAR | Status: DC | PRN
  Administered 2019-08-31 (×3): 4 mg via INTRAVENOUS
  Filled 2019-08-31 (×3): qty 2

## 2019-08-31 MED ORDER — INSULIN STARTER KIT- PEN NEEDLES (ENGLISH)
1.0000 | Freq: Once | Status: DC
  Filled 2019-08-31: qty 1

## 2019-08-31 MED ORDER — INSULIN ASPART 100 UNIT/ML ~~LOC~~ SOLN: 0.0000 [IU] | Freq: Three times a day (TID) | SUBCUTANEOUS | Status: DC

## 2019-08-31 MED ORDER — DILTIAZEM HCL-DEXTROSE 125-5 MG/125ML-% IV SOLN (PREMIX)
5.0000 mg/h | INTRAVENOUS | Status: DC
  Administered 2019-08-31: 5 mg/h via INTRAVENOUS
  Administered 2019-09-01: 15 mg/h via INTRAVENOUS
  Administered 2019-09-01 (×2): 10 mg/h via INTRAVENOUS
  Filled 2019-08-31 (×4): qty 125

## 2019-08-31 MED ORDER — INSULIN ASPART 100 UNIT/ML ~~LOC~~ SOLN: 5.0000 [IU] | Freq: Three times a day (TID) | SUBCUTANEOUS | Status: DC

## 2019-08-31 MED ORDER — DILTIAZEM HCL 25 MG/5ML IV SOLN
10.0000 mg | INTRAVENOUS | Status: AC
  Administered 2019-08-31: 10 mg via INTRAVENOUS
  Filled 2019-08-31: qty 5

## 2019-08-31 MED ORDER — INSULIN GLARGINE 100 UNIT/ML ~~LOC~~ SOLN
8.0000 [IU] | SUBCUTANEOUS | Status: AC
  Administered 2019-08-31: 8 [IU] via SUBCUTANEOUS
  Filled 2019-08-31: qty 0.08

## 2019-08-31 MED ORDER — INSULIN ASPART 100 UNIT/ML ~~LOC~~ SOLN
5.0000 [IU] | Freq: Three times a day (TID) | SUBCUTANEOUS | Status: DC
  Administered 2019-08-31 – 2019-09-04 (×12): 5 [IU] via SUBCUTANEOUS

## 2019-08-31 MED ORDER — DILTIAZEM LOAD VIA INFUSION
10.0000 mg | Freq: Once | INTRAVENOUS | Status: DC | PRN
  Filled 2019-08-31: qty 10

## 2019-08-31 MED ORDER — LIVING WELL WITH DIABETES BOOK
Freq: Once | Status: AC
  Filled 2019-08-31: qty 1

## 2019-08-31 MED ORDER — INSULIN GLARGINE 100 UNIT/ML ~~LOC~~ SOLN
20.0000 [IU] | Freq: Every day | SUBCUTANEOUS | Status: DC
  Administered 2019-08-31: 20 [IU] via SUBCUTANEOUS
  Filled 2019-08-31 (×2): qty 0.2

## 2019-08-31 MED ORDER — INSULIN ASPART 100 UNIT/ML ~~LOC~~ SOLN
0.0000 [IU] | Freq: Three times a day (TID) | SUBCUTANEOUS | Status: DC
  Administered 2019-08-31: 20 [IU] via SUBCUTANEOUS
  Administered 2019-08-31: 4 [IU] via SUBCUTANEOUS
  Administered 2019-09-01 (×3): 11 [IU] via SUBCUTANEOUS
  Administered 2019-09-02 (×2): 7 [IU] via SUBCUTANEOUS
  Administered 2019-09-02 – 2019-09-03 (×4): 4 [IU] via SUBCUTANEOUS
  Administered 2019-09-04: 7 [IU] via SUBCUTANEOUS

## 2019-08-31 NOTE — Progress Notes (Signed)
Noted that patient's CBGs have been elevated since IV insulin was stopped. CBG was 353 mg/dl at 6314 and then lab glucose of 417 mg/dl.   Recommend Lantus 20 units at HS starting tonight and continuing Novolog RESISTANT correction scale TID and Novolog 5 units TID.   Will continue to monitor blood sugars while in the hospital.   Smith Mince RN BSN CDE Diabetes Coordinator Pager: [phone]  8am-5pm

## 2019-08-31 NOTE — Progress Notes (Signed)
Brief Nutrition Education Notes   RD consulted for nutrition education regarding diabetes.   No results found for: HGBA1C PTA DM medications are none.   Labs reviewed: CBGS: 353 (inpatient orders for glycemic control are 0-20 units insulin aspart TID with meals, 5 units insulin aspart TID with meals, and IV insulin drip).   Attempted to speak with pt x 3 over phone, however, line busy at all attempts.   RD provided "Carbohydrate Counting for People with Diabetes" handout from the Academy of Nutrition and Dietetics. Attached to AVS/ discharge summary. As this is a new diagnosis, RD will also refer to outpatient diabetes education through [facility]'s Nutrition and Diabetes Education Services.   Current diet order is full liquid, patient is consuming approximately n/a% of meals at this time. Labs and medications reviewed. No further nutrition interventions warranted at this time. RD contact information provided. If additional nutrition issues arise, please re-consult RD.  Levada Schilling, RD, LDN, CDCES Registered Dietitian II Certified Diabetes Care and Education Specialist Please refer to [facility] for RD and/or RD on-call/weekend/after hours pager

## 2019-08-31 NOTE — Discharge Instructions (Signed)

## 2019-08-31 NOTE — Progress Notes (Signed)
Patient monitor alarming HR 170's. Patient lying in bed. CZM titrated to 10mg /hr. Night sift RN in with RN to complete EKG. Patient has no complaints of discomfort or chest patient at this time.

## 2019-08-31 NOTE — Progress Notes (Signed)
Pt CBG ranging in between 140-180 x4. On-call provider made aware. Ordered to keep pt on endotool. Will relay to day team

## 2019-08-31 NOTE — Progress Notes (Signed)
Pt HR sustaining in the 170s at rest. Pt asymptomatic. Dayshift RN titrated Cardizem gtt to 10mg /hr. EKG done. Provider paged.

## 2019-08-31 NOTE — Progress Notes (Signed)
RN in to give Lantus see MAR. Once Lantus administered patient HR >180-200.  EKG completed, blood glucose checked, vitals taken. Patient stating that she can feel her heart rate racing and is tearful at this time.   1015- RR RN notified and MD Ashley Royalty paged. RR RN  At bedside.   Patient B/p and HR continue to be monitored and another EKG done and showed A Fib with RVR.  1028: RR RN spoke with MD and new orders received. AD Michele Rockers, RN at bedside.  RR RN administered 10 of CZM. Patient heart rate down to 160's. BP continued to be monitored.   1050: Patient HR back at 180. RR RN spoke with MD Ashley Royalty and new orders received to start CZM drip.   RN will continue to monitor.

## 2019-08-31 NOTE — Significant Event (Addendum)
Rapid Response Event Note  Overview: Cardiac - HR 200s  Initial Focused Assessment: Staff called with concerns of patient's HR being in the 190s. Staff had obtained an EKG - the EKG read SVT however on the monitor it appeared that the patient was in AF. HR was irregular - fluctuated between 160-190s. BP stable - 130/94 (106), 100% on 3L [place], RR normal - denies CP/denies SOB, patient was awake and oriented. Skin warm and dry. Not in acute distress.   We attempted vagal maneuvers, carotid massage, and blowing into a straw for good measure.   I was able to talk to the [facility] MD - orders receivded for Cardizem 10 mg IV x 1. We administered this dose over a few minutes and HR improved into the 130s but did not sustain there and increased back to the 180s. I was able to call to the [facility] MD again, received orders for Cardizem Infusion to be started - without a bolus  Interventions: -- Vagal Maneuvers -- Carotid Massage -- Cardizem 10 mg IV Push -- Initiate Cardizem Infusion  Plan of Care: -- Monitor VS - pay attention to HR and BP while infusion is on -- Rest Per MD  Event Summary:  Call Time 1015 Arrival Time 1016 End Time 1100  Pamela Hudson R

## 2019-08-31 NOTE — Progress Notes (Signed)
   08/31/19 1025  Assess: MEWS Score  BP (!) 132/91  Pulse Rate (!) 184  ECG Heart Rate (!) 185  Resp 20  SpO2 99 %  Assess: MEWS Score  MEWS Temp 0  MEWS Systolic 0  MEWS Pulse 3  MEWS RR 0  MEWS LOC 0  MEWS Score 3  MEWS Score Color Yellow  Assess: if the MEWS score is Yellow or Red  Were vital signs taken at a resting state? Yes  Focused Assessment Documented focused assessment  Early Detection of Sepsis Score *See Row Information* High  MEWS guidelines implemented *See Row Information* Yes  Treat  MEWS Interventions Escalated (See documentation below);Administered prn meds/treatments  Take Vital Signs  Increase Vital Sign Frequency  Yellow: Q 2hr X 2 then Q 4hr X 2, if remains yellow, continue Q 4hrs  Escalate  MEWS: Escalate Yellow: discuss with charge nurse/RN and consider discussing with provider and RRT  Notify: Charge Nurse/RN  Name of Charge Nurse/RN Notified Aimee to [place], RN  Date Charge Nurse/RN Notified 08/31/19  Time Charge Nurse/RN Notified 1037  Notify: Provider  Provider Name/Title Dr. Ashley Royalty  Date Provider Notified 08/31/19  Time Provider Notified 1015  Notification Type Page  Notification Reason Change in status  Response See new orders  Date of Provider Response 08/31/19  Time of Provider Response 1020  Notify: Rapid Response  Name of Rapid Response RN Notified [place]  Date Rapid Response Notified 08/31/19  Time Rapid Response Notified 1000  Document  Patient Outcome Stabilized after interventions (HR improving after Cardizem IV push; RR at bedside with RN)  Progress note created (see row info) Yes

## 2019-08-31 NOTE — Progress Notes (Signed)
   08/31/19 1950  Assess: MEWS Score  Temp 98.4 F (36.9 C)  BP (!) 94/58  Pulse Rate (!) 170  ECG Heart Rate (!) 171  Resp 15  Level of Consciousness Alert  SpO2 100 %  O2 Device Nasal Cannula  O2 Flow Rate (L/min) 1 L/min  Assess: MEWS Score  MEWS Temp 0  MEWS Systolic 1  MEWS Pulse 3  MEWS RR 0  MEWS LOC 0  MEWS Score 4  MEWS Score Color Red  Assess: if the MEWS score is Yellow or Red  Were vital signs taken at a resting state? Yes  Focused Assessment Documented focused assessment  Early Detection of Sepsis Score *See Row Information* Low  MEWS guidelines implemented *See Row Information* Yes  Treat  MEWS Interventions Escalated (See documentation below)  Take Vital Signs  Increase Vital Sign Frequency  Red: Q 1hr X 4 then Q 4hr X 4, if remains red, continue Q 4hrs  Escalate  MEWS: Escalate Red: discuss with charge nurse/RN and provider, consider discussing with RRT  Notify: Charge Nurse/RN  Name of Charge Nurse/RN Notified [place] RN  Date Charge Nurse/RN Notified 08/31/19  Time Charge Nurse/RN Notified 1930  Notify: Provider  Provider Name/Title Cros[place] MD  Date Provider Notified 08/31/19  Time Provider Notified 1940  Notification Type Page  Notification Reason Change in status  Response See new orders  Date of Provider Response 08/31/19  Time of Provider Response 1951  Notify: Rapid Response  Name of Rapid Response RN Notified Theodoro Grist RN  Date Rapid Response Notified 08/31/19  Time Rapid Response Notified 1957  Document  Patient Outcome Stabilized after interventions  Progress note created (see row info) Yes   Red MEWS protocol initiated. Spoke with Consulting civil engineer, RR and MD, ordered bolus of NS and Cardizem bolus PRN. NS bolus given. Pt HR 95 at this time. Pt stable in the bed. No complains at this time.

## 2019-08-31 NOTE — Progress Notes (Signed)
Spoke with patient about the new onset of diabetes.  States that her father and grandmother have diabetes and she has helped give insulin to her grandmother in the past. States that she took some steroids for 5 days for her right foot swelling about 2 weeks ago and then started having excessive thirst and urination.   Reviewed general diabetes care, normal blood sugar (since her blood sugar was 1185 mg/dl on admission), HgbA1C that is in process, and checking blood sugars at home with meter. Demonstrated insulin pen, reviewed steps, and patient stated that she used these for her grandmother. Will order insulin pen starter kit with insulin pen needles and written directions for using the pen. Encouraged patient to practice giving her own injections while in the hospital.  Ordered Living Well with Diabetes booklet, given information on "the plate method" and information on diabetes care plan. Ordered consult for dietician to see her and to give her ideas for meal planning.   Will continue to monitor blood sugars while in the hospital. Will need home blood glucose meter prescription (order # [id number]) at discharge. Patient would like to use insulin pens if patient is discharged on insulin.   Harvel Ricks RN BSN CDE Diabetes Coordinator Pager: [phone]  8am-5pm

## 2019-08-31 NOTE — Progress Notes (Signed)
PROGRESS NOTE    Pamela Hudson  BLT:[id number] DOB: November 16, 1985 DOA: 08/30/2019 PCP: Curt Jews, PA-C   Brief Narrative: HPI per Dr. Wyline Copas is a 34 y.o. female with medical history significant for pulmonary hypertension with chronic right-sided heart failure, CKD stage III, sickle cell disease with anemia, and hypertension who presented to [place] ED for evaluation of generalized weakness and polydipsia.  History is limited from patient due to excessive somnolence therefore majority of history is obtained from EDP and chart review.  Patient reportedly presented to [place] ED for evaluation of generalized weakness and polydipsia.  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  She has been having polyuria without dysuria.  She reported generalized abdominal pain without nausea or vomiting.  At time of evaluation upon transfer to [facility] patient is excessively somnolent and unable to provide further history.  [facility] [facility] ED Course:  Initial vitals showed BP 113/51, pulse 109, RR 18, temp 97.9 Fahrenheit, SPO2 100% on room air.  Labs are notable for serum glucose 1185, sodium 124 (150 when corrected for hyperglycemia), potassium 5.8, chloride 86, bicarb 24, BUN 66, creatinine 1.87, AST 50, ALT 92, alk phos 181, total bilirubin 9.5, WBC 29.7, hemoglobin 7.2, hematocrit 20.8, MCV 113, platelets 314,000, lipase 219, serum osmolality 364.  SARS-CoV-2 PCR is negative.  Urinalysis shows >500 glucose, negative ketones, negative nitrites, negative leukocytes, 0-5 RBC/hpf, 0-5 WBC/hpf, few bacteria on microscopy.  Urine pregnancy test was negative.  Mono screen is negative.  Group A strep PCR is negative.  Portable chest x-ray shows right Port-A-Cath with tip at the cavoatrial junction, no focal consolidation, edema, or effusion.  Patient was given 1 L LR, 1 L NS, and started on  insulin infusion.  The hospitalist service was consulted to transfer for admission and further management.  Assessment & Plan:   Active Problems:   Sickle cell anemia (HCC)   Essential hypertension   Hyperkalemia   Hyperosmolar hyperglycemic state (HHS) (HCC)   Chronic right-sided heart failure (HCC)   Pulmonary hypertension (HCC)   CKD (chronic kidney disease) stage 3, GFR 30-59 ml/min   #1 new onset diabetes admitted with hyperglycemia-patient was initially started on insulin drip which has been DC'd and started Lantus with resistant sliding scale insulin and NovoLog prior to meals.  Blood sugar better and stabilizing.  Appreciate diabetic coordinator and dietitian's input. hbglobin A1c is still pending  #2 A. fib RVR started on Cardizem continue the drip. Check TSH  #3 history of pulmonary hypertension and chronic right-sided heart failure with ejection fraction 55 to 60% and mild pulmonary hypertension.  Patient will follow up with [facility] cardiology as an outpatient.  She is on torsemide at home which is on hold now.  #4 CKD stage III stable continue to monitor  #5 history of essential hypertension patient is on Lasix and Toprol at home it has been on hold due to soft blood pressure.  #6 history of sickle cell disease on Hydrea  #7 anemia of chronic disease hemoglobin 7.1 we will transfuse 1 unit of packed RBC  #8 elevated LFTs total bilirubin 7.5 AST 51 ALT 78 alkaline phosphatase 136 Albumin 3.0. RUQ US-s/p chole no focal lesion.  #9 hyperlipidemia LDL 135 triglycerides 96 hold statin   #10 elevated lipase started soft diet  #11 leukocytosis UA negative for UTI chest x-ray shows pulmonary vascular congestion.afebrile will  monitor    Estimated body mass index is 27.87 kg/m as calculated from the following:   Height as of this encounter: '4\' 11"'  (1.499 m).   Weight as of this encounter: 62.6 kg.  DVT prophylaxis: Heparin  code Status: Full code Family  Communication: Discussed with patient Disposition Plan:  Status is: Inpatient  Dispo: The patient is from:home              Anticipated d/c is to:hom e              Anticipated d/c date is: 1 to 2 days              Patient currently is not medically stable to d/c.   Consultants:  none  Procedures: Antimicrobials  Subjective:   Objective: Vitals:   08/31/19 1345 08/31/19 1400 08/31/19 1430 08/31/19 1500  BP: 114/69 113/68  112/65  Pulse: 97 95 97 99  Resp: 14 (!) 23 19 (!) 21  Temp: 98.2 F (36.8 C)   98.3 F (36.8 C)  TempSrc:    Oral  SpO2: 98% 100% 100% 99%  Weight:      Height:        Intake/Output Summary (Last 24 hours) at 08/31/2019 1627 Last data filed at 08/31/2019 1227 Gross per 24 hour  Intake 940.78 ml  Output 325 ml  Net 615.78 ml   Filed Weights   08/30/19 1103 08/31/19 0514  Weight: 73 kg 62.6 kg    Examination:  General exam: Appears calm and comfortable  Respiratory system: Clear to auscultation. Respiratory effort normal. Cardiovascular system: S1 & S2 heard, RRR. No JVD, murmurs, rubs, gallops or clicks. No pedal edema. Gastrointestinal system: Abdomen is nondistended, soft and nontender. No organomegaly or masses felt. Normal bowel sounds heard. Central nervous system: Alert and oriented. No focal neurological deficits. Extremities: Symmetric 5 x 5 power. Skin: No rashes, lesions or ulcers Psychiatry: Judgement and insight appear normal. Mood & affect appropriate.     Data Reviewed: I have personally reviewed following labs and imaging studies  CBC: Recent Labs  Lab 08/30/19 1134 08/31/19 0527  WBC 29.7* 32.0*  NEUTROABS 25.2*  --   HGB 7.2* 7.1*  HCT 20.8* 19.5*  MCV 113.0* 106.6*  PLT 314 352   Basic Metabolic Panel: Recent Labs  Lab 08/30/19 2041 08/31/19 0219 08/31/19 0527 08/31/19 1048 08/31/19 1352  NA 139 140 141 140 134*  K 5.4* 4.8 4.4 4.2 4.4  CL 103 103 104 104 101  CO2 '26 27 28 27 26  ' GLUCOSE 321* 195*  200* 228* 417*  BUN 56* 54* 55* 53* 54*  CREATININE 1.79* 1.63* 1.77* 1.61* 1.67*  CALCIUM 9.4 9.0 9.1 8.9 8.7*   GFR: Estimated Creatinine Clearance: 38.9 mL/min (A) (by C-G formula based on SCr of 1.67 mg/dL (H)). Liver Function Tests: Recent Labs  Lab 08/30/19 1134 08/31/19 0527  AST 50* 51*  ALT 92* 78*  ALKPHOS 181* 136*  BILITOT 9.5* 7.5*  PROT 9.7* 9.2*  ALBUMIN 3.4* 3.0*   Recent Labs  Lab 08/30/19 1134  LIPASE 219*   No results for input(s): AMMONIA in the last 168 hours. Coagulation Profile: No results for input(s): INR, PROTIME in the last 168 hours. Cardiac Enzymes: No results for input(s): CKTOTAL, CKMB, CKMBINDEX, TROPONINI in the last 168 hours. BNP (last 3 results) No results for input(s): PROBNP in the last 8760 hours. HbA1C: No results for input(s): HGBA1C in the last 72 hours. CBG: Recent Labs  Lab  08/31/19 0507 08/31/19 0655 08/31/19 0910 08/31/19 1016 08/31/19 1247  GLUCAP 168* 179* 180* 198* 353*   Lipid Profile: Recent Labs    08/31/19 0527  CHOL 170  HDL 16*  LDLCALC 135*  TRIG 96  CHOLHDL 10.6   Thyroid Function Tests: No results for input(s): TSH, T4TOTAL, FREET4, T3FREE, THYROIDAB in the last 72 hours. Anemia Panel: Recent Labs    08/30/19 2041  VITAMINB12 1,287*  FOLATE 35.8  FERRITIN >7,500*  TIBC 143*  IRON 163  RETICCTPCT 5.1*   Sepsis Labs: No results for input(s): PROCALCITON, LATICACIDVEN in the last 168 hours.  Recent Results (from the past 240 hour(s))  Group A Strep by PCR     Status: None   Collection Time: 08/30/19 11:34 AM   Specimen: Throat; Sterile Swab  Result Value Ref Range Status   Group A Strep by PCR NOT DETECTED NOT DETECTED Final    Comment: Performed at [facility], [address] [place], [place] [address]  SARS Coronavirus 2 by RT PCR (hospital order, performed in [facility] hospital lab) Nasopharyngeal Nasopharyngeal Swab     Status: None   Collection Time: 08/30/19  1:34  PM   Specimen: Nasopharyngeal Swab  Result Value Ref Range Status   SARS Coronavirus 2 NEGATIVE NEGATIVE Final    Comment: (NOTE) SARS-CoV-2 target nucleic acids are NOT DETECTED.  The SARS-CoV-2 RNA is generally detectable in upper and lower respiratory specimens during the acute phase of infection. The lowest concentration of SARS-CoV-2 viral copies this assay can detect is 250 copies / mL. A negative result does not preclude SARS-CoV-2 infection and should not be used as the sole basis for treatment or other patient management decisions.  A negative result may occur with improper specimen collection / handling, submission of specimen other than nasopharyngeal swab, presence of viral mutation(s) within the areas targeted by this assay, and inadequate number of viral copies (<250 copies / mL). A negative result must be combined with clinical observations, patient history, and epidemiological information.  Fact Sheet for Patients:   StrictlyIdeas.no  Fact Sheet for Healthcare Providers: BankingDealers.co.za  This test is not yet approved or  cleared by the [place] FDA and has been authorized for detection and/or diagnosis of SARS-CoV-2 by FDA under an Emergency Use Authorization (EUA).  This EUA will remain in effect (meaning this test can be used) for the duration of the COVID-19 declaration under Section 564(b)(1) of the Act, 21 U.S.C. section 360bbb-3(b)(1), unless the authorization is terminated or revoked sooner.  Performed at [facility], [address], [place], [place] [address]          Radiology Studies: DG Chest Portable 1 View  Result Date: 08/30/2019 CLINICAL DATA:  Shortness of breath and cough. Generalized weakness. EXAM: PORTABLE CHEST 1 VIEW COMPARISON:  09/01/2018 FINDINGS: There is a right chest wall port a catheter with tip at the cavoatrial junction. Cardiac enlargement. Pulmonary  vascular congestion. No airspace consolidation. No pleural effusion noted. IMPRESSION: Cardiac enlargement and pulmonary vascular congestion. Unchanged from previous exam. Electronically Signed   By: Kerby Moors M.D.   On: 08/30/2019 12:01   US Abdomen Limited RUQ  Result Date: 08/31/2019 CLINICAL DATA:  Right upper quadrant pain EXAM: ULTRASOUND ABDOMEN LIMITED RIGHT UPPER QUADRANT COMPARISON:  None. FINDINGS: Gallbladder: The patient is status post cholecystectomy. Common bile duct: Diameter: 4 mm Liver: No focal lesion identified. There is mild hepatomegaly. Within normal limits in parenchymal echogenicity. Portal  vein is patent on color Doppler imaging with normal direction of blood flow towards the liver. Other: None. IMPRESSION: Mild hepatomegaly.  No focal hepatic lesion. Status post cholecystectomy Electronically Signed   By: Prudencio Pair M.D.   On: 08/31/2019 00:16        Scheduled Meds: . Chlorhexidine Gluconate Cloth  6 each Topical Daily  . folic acid  1 mg Oral Daily  . heparin  5,000 Units Subcutaneous Q8H  . hydroxyurea  1,000 mg Oral Daily  . insulin aspart  0-20 Units Subcutaneous TID WC  . insulin aspart  5 Units Subcutaneous TID WC  . insulin starter kit- pen needles  1 kit Other Once  . living well with diabetes book   Does not apply Once   Continuous Infusions: . sodium chloride 500 mL (08/30/19 1421)  . sodium chloride    . sodium chloride 75 mL/hr at 08/30/19 2104  . dextrose 5 % and 0.45% NaCl 75 mL/hr at 08/31/19 0020  . diltiazem (CARDIZEM) infusion 5 mg/hr (08/31/19 1116)  . insulin 3.2 Units/hr (08/31/19 0657)     LOS: 1 day     Georgette Shell, MD 08/31/2019, 4:27 PM

## 2019-08-31 NOTE — Progress Notes (Signed)
Patient noted to be in NSR. EKG completed and MD notified. CZM gtt at 5mg /hr.   RN will continue to monitor

## 2019-08-31 NOTE — Plan of Care (Signed)

## 2019-08-31 NOTE — Progress Notes (Signed)
Pt vomited yellow liquid. MD made aware, ordered zofran prn.

## 2019-09-01 LAB — HEMOGLOBIN A1C
Hgb A1c MFr Bld: 7.8 % — ABNORMAL HIGH (ref 4.8–5.6)
Mean Plasma Glucose: 177 mg/dL

## 2019-09-01 LAB — GLUCOSE, CAPILLARY
Glucose-Capillary: 261 mg/dL — ABNORMAL HIGH (ref 70–99)
Glucose-Capillary: 163 mg/dL — ABNORMAL HIGH (ref 70–99)
Glucose-Capillary: 97 mg/dL (ref 70–99)
Glucose-Capillary: 291 mg/dL — ABNORMAL HIGH (ref 70–99)
Glucose-Capillary: 114 mg/dL — ABNORMAL HIGH (ref 70–99)

## 2019-09-01 MED ORDER — INSULIN GLARGINE 100 UNIT/ML ~~LOC~~ SOLN
30.0000 [IU] | Freq: Every day | SUBCUTANEOUS | Status: DC
  Administered 2019-09-01 – 2019-09-03 (×3): 30 [IU] via SUBCUTANEOUS
  Filled 2019-09-01 (×4): qty 0.3

## 2019-09-01 MED ORDER — FUROSEMIDE 10 MG/ML IJ SOLN
20.0000 mg | Freq: Every day | INTRAMUSCULAR | Status: DC
  Administered 2019-09-01 – 2019-09-03 (×3): 20 mg via INTRAVENOUS
  Filled 2019-09-01 (×3): qty 2

## 2019-09-01 MED ORDER — DIGOXIN 0.25 MG/ML IJ SOLN
0.2500 mg | Freq: Once | INTRAMUSCULAR | Status: AC
  Administered 2019-09-01: 0.25 mg via INTRAVENOUS
  Filled 2019-09-01: qty 2

## 2019-09-01 MED ORDER — NYSTATIN 100000 UNIT/ML MT SUSP
5.0000 mL | Freq: Four times a day (QID) | OROMUCOSAL | Status: DC
  Administered 2019-09-01 – 2019-09-04 (×12): 500000 [IU] via ORAL
  Filled 2019-09-01 (×11): qty 5

## 2019-09-01 MED ORDER — DIGOXIN 125 MCG PO TABS
0.1250 mg | ORAL_TABLET | Freq: Every day | ORAL | Status: DC
  Administered 2019-09-01 – 2019-09-04 (×4): 0.125 mg via ORAL
  Filled 2019-09-01 (×4): qty 1

## 2019-09-01 MED ORDER — ACETAMINOPHEN 500 MG PO TABS
500.0000 mg | ORAL_TABLET | Freq: Four times a day (QID) | ORAL | Status: DC | PRN
  Administered 2019-09-01 – 2019-09-03 (×5): 500 mg via ORAL
  Filled 2019-09-01 (×5): qty 1

## 2019-09-01 NOTE — Progress Notes (Signed)
Inpatient Diabetes Program Recommendations  AACE/ADA: New Consensus Statement on Inpatient Glycemic Control (2015)  Target Ranges:  Prepandial:   less than 140 mg/dL      Peak postprandial:   less than 180 mg/dL (1-2 hours)      Critically ill patients:  140 - 180 mg/dL   Lab Results  Component Value Date   GLUCAP 261 (H) 09/01/2019   HGBA1C 7.8 (H) 08/31/2019    Review of Glycemic Control Results for RUSS[place], SCHEIDERER (MRN [id number]) as of 09/01/2019 09:56  Ref. Range 08/31/2019 12:47 08/31/2019 16:39 08/31/2019 21:24 09/01/2019 06:30  Glucose-Capillary Latest Ref Range: 70 - 99 mg/dL 430 (H) 148 (H) 403 (H) 261 (H)   Diabetes history: New onset Outpatient Diabetes medications: none Current orders for Inpatient glycemic control: Novolog 5 units TID, Novolog 0-20 units TID, Lantus 20 units QHS  Inpatient Diabetes Program Recommendations:    Consider increasing Lantus to 30 units QHS.   Noted patient has received two doses of correction this AM, placing patient at risk for hypoglycemic event. Reached out to RN by secure chat to inform and encouraged to obtain CBG and reach out to MD for further orders.    Thanks, Lujean Rave, MSN, RNC-OB Diabetes Coordinator [phone] (8a-5p)

## 2019-09-01 NOTE — Evaluation (Signed)
Physical Therapy Evaluation Patient Details Name: Pamela Hudson MRN: [id number] DOB: 08-01-1985 Today's Date: 09/01/2019   History of Present Illness  Pt is a 34 y/o female admitted secondary to excessive somnolence. Pt found to have new onset diabetes admitted with hyperglycemia and new onset a-fib. PMH including but not limited to pulmonary hypertension with chronic right-sided heart failure, CKD stage III, sickle cell disease with anemia, and hypertension.     Clinical Impression  Pt presented supine in bed with HOB elevated, awake and willing to participate in therapy session. Prior to admission, pt reported that she was independent with all functional mobility and ADLs. Pt lives in an apartment with three flights of stairs to enter, no elevator. At the time of evaluation, pt overall moving well; however, her HR fluctuated throughout from the low 90's to as high as 170 bpm. RN aware. Pt asymptomatic throughout. PT will continue to follow pt acutely to progress mobility as tolerated per PT POC.    Follow Up Recommendations No PT follow up    Equipment Recommendations  None recommended by PT    Recommendations for Other Services       Precautions / Restrictions Precautions Precautions: Fall Restrictions Weight Bearing Restrictions: No      Mobility  Bed Mobility Overal bed mobility: Independent                Transfers Overall transfer level: Independent Equipment used: None                Ambulation/Gait Ambulation/Gait assistance: Min Emergency planning/management officer (Feet): 200 Feet Assistive device: IV Pole Gait Pattern/deviations: Step-through pattern;Decreased stride length Gait velocity: decreased   General Gait Details: pt with slow, steady gait, pushing IV pole with one UE but not needing for support  Stairs            Wheelchair Mobility    Modified Rankin (Stroke Patients Only)       Balance Overall balance assessment: Needs  assistance Sitting-balance support: Feet supported Sitting balance-Leahy Scale: Good     Standing balance support: During functional activity;No upper extremity supported Standing balance-Leahy Scale: Fair                               Pertinent Vitals/Pain Pain Assessment: No/denies pain    Home Living Family/patient expects to be discharged to:: Private residence Living Arrangements: Spouse/significant other Available Help at Discharge: Friend(s) Type of Home: Apartment Home Access: Stairs to enter   [employer] of Steps: 3 flights Home Layout: One level Home Equipment: None      Prior Function Level of Independence: Independent               Hand Dominance        Extremity/Trunk Assessment   Upper Extremity Assessment Upper Extremity Assessment: Overall WFL for tasks assessed    Lower Extremity Assessment Lower Extremity Assessment: Overall WFL for tasks assessed       Communication   Communication: No difficulties  Cognition Arousal/Alertness: Awake/alert Behavior During Therapy: WFL for tasks assessed/performed Overall Cognitive Status: Within Functional Limits for tasks assessed                                        General Comments      Exercises     Assessment/Plan    PT Assessment  Patient needs continued PT services  PT Problem List Decreased activity tolerance;Cardiopulmonary status limiting activity       PT Treatment Interventions DME instruction;Gait training;Stair training;Functional mobility training;Therapeutic activities;Balance training;Therapeutic exercise;Neuromuscular re-education;Patient/family education    PT Goals (Current goals can be found in the Care Plan section)  Acute Rehab PT Goals Patient Stated Goal: to go home and feel better PT Goal Formulation: With patient Time For Goal Achievement: 09/15/19 Potential to Achieve Goals: Good    Frequency Min 3X/week   Barriers  to discharge        Co-evaluation               AM-PAC PT "6 Clicks" Mobility  Outcome Measure Help needed turning from your back to your side while in a flat bed without using bedrails?: None Help needed moving from lying on your back to sitting on the side of a flat bed without using bedrails?: None Help needed moving to and from a bed to a chair (including a wheelchair)?: None Help needed standing up from a chair using your arms (e.g., wheelchair or bedside chair)?: None Help needed to walk in hospital room?: None Help needed climbing 3-5 steps with a railing? : A Little 6 Click Score: 23    End of Session   Activity Tolerance: Patient tolerated treatment well Patient left: in bed;with call bell/phone within reach Nurse Communication: Mobility status PT Visit Diagnosis: Other abnormalities of gait and mobility (R26.89);Muscle weakness (generalized) (M62.81)    Time: 6389-3734 PT Time Calculation (min) (ACUTE ONLY): 21 min   Charges:   PT Evaluation $PT Eval Moderate Complexity: 1 Mod          Eduard Clos, PT, DPT  Acute Rehabilitation Services Pager [phone] Office [place] 09/01/2019, 4:17 PM

## 2019-09-01 NOTE — Progress Notes (Signed)
PROGRESS NOTE    Pamela Hudson  ZGY:[id number] DOB: 05-19-1985 DOA: 08/30/2019 PCP: Curt Jews, PA-C    Brief Narrative: HPI per Dr. Clois Dupes [name] a 34 y.o.femalewith medical history significant forpulmonary hypertension with chronic right-sided heart failure, CKD stage III, sickle cell disease with anemia,andhypertensionwho presented to [facility] ED for evaluation of generalized weakness and polydipsia. History is limited from patient due to excessive somnolence therefore majority of history is obtained from EDP and chart review.  Patient reportedly presented to [facility] ED for evaluation of generalized weakness and polydipsia. 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 She has been having polyuria without dysuria. She reported generalized abdominal pain without nausea or vomiting. At time of evaluation upon transfer to [facility] patient is excessively somnolent and unable to provide further history.  [facility] HPED Course: Initial vitals showed BP 113/51, pulse 109, RR 18, temp 97.9 Fahrenheit, SPO2 100% on room air.  Labs are notable for serum glucose 1185, sodium 124 (150 when corrected for hyperglycemia), potassium 5.8, chloride 86, bicarb 24, BUN 66, creatinine 1.87, AST 50, ALT 92, alk phos 181, total bilirubin 9.5, WBC 29.7, hemoglobin 7.2, hematocrit 20.8, MCV 113, platelets 314,000, lipase 219, serum osmolality 364.  SARS-CoV-2 PCR is negative. Urinalysis shows >500 glucose, negative ketones, negative nitrites, negative leukocytes, 0-5 RBC/hpf, 0-5 WBC/hpf, few bacteria on microscopy. Urine pregnancy test was negative. Mono screen is negative. Group A strep PCR is negative.  Portable chest x-ray shows right Port-A-Cath with tip at the cavoatrial junction, no focal consolidation, edema, or effusion. Patient was given 1 L LR, 1 L NS, and started on  insulin infusion. The hospitalist service was consulted to transfer for admission and further management.   Assessment & Plan:   Active Problems:   Sickle cell anemia (HCC)   Essential hypertension   Hyperkalemia   Transaminitis   Hyperosmolar hyperglycemic state (HHS) (HCC)   Chronic right-sided heart failure (HCC)   Pulmonary hypertension (HCC)   CKD (chronic kidney disease) stage 3, GFR 30-59 ml/min   Diabetes mellitus due to underlying condition with hyperosmolarity without coma, without long-term current use of insulin ([place])     #1 new onset diabetes admitted with hyperglycemia-patient was initially started on insulin drip which has been DC'd and started Lantus with resistant sliding scale insulin and NovoLog prior to meals.  Blood sugar better and stabilizing.  Appreciate diabetic coordinator and dietitian's input. hbglobin A1c7.5 CBG (last 3)  Recent Labs    09/01/19 0630 09/01/19 1025 09/01/19 1122  GLUCAP 261* 163* 97     #2 A. fib RVR started on Cardizem continue the drip. Check TSH Discussed with cardiology recommended to add digoxin continue Cardizem drip try to taper  #3 history of pulmonary hypertension and chronic right-sided heart failure with ejection fraction 55 to 60% and mild pulmonary hypertension.  Patient will follow up with [facility] cardiology as an outpatient.  She is on torsemide at home which was on hold .  Chest x-ray from yesterday still shows pulmonary vascular congestion will restart diuretics.  #4 CKD stage III stable continue to monitor  #5 history of essential hypertension - bp 110/72 starting Lasix today monitor closely.  #6 history of sickle cell disease on Hydrea  #7 anemia of chronic disease hemoglobin 7.1 follow-up labs in a.m.    #8 elevated LFTs total bilirubin 7.5 AST 51 ALT 78 alkaline phosphatase 136  Albumin 3.0. RUQ US-s/p chole no focal lesion.  #9 hyperlipidemia LDL 135 triglycerides 96 hold statin   #10  elevated lipase started soft diet  #11 leukocytosis UA negative for UTI chest x-ray shows pulmonary vascular congestion.afebrile will  monitor  #2 oral thrush nystatin.  Estimated body mass index is 28.42 kg/m as calculated from the following:   Height as of this encounter: '4\' 11"'  (1.499 m).   Weight as of this encounter: 63.8 kg.   Subjective:  Patient sitting up in bedside commode awake alert anxious to eat some food.  Denies nausea vomiting abdominal pain or diarrhea. Objective: Vitals:   09/01/19 0352 09/01/19 0400 09/01/19 0809 09/01/19 1100  BP: 104/62 110/72    Pulse: 96   91  Resp: 17 17    Temp: 98.3 F (36.8 C)  98.4 F (36.9 C) 98.4 F (36.9 C)  TempSrc: Oral  Oral Oral  SpO2: 100%     Weight:      Height:        Intake/Output Summary (Last 24 hours) at 09/01/2019 1408 Last data filed at 09/01/2019 0848 Gross per 24 hour  Intake 1320.3 ml  Output 1350 ml  Net -29.7 ml   Filed Weights   08/30/19 1103 08/31/19 0514 09/01/19 0350  Weight: 73 kg 62.6 kg 63.8 kg    Examination:  General exam: Appears calm and comfortable  Respiratory system: rales to auscultation. Respiratory effort normal. Cardiovascular system: S1 & S2 heard, RRR. No JVD, murmurs, rubs, gallops or clicks. No pedal edema. Gastrointestinal system: Abdomen is nondistended, soft and nontender. No organomegaly or masses felt. Normal bowel sounds heard. Central nervous system: Alert and oriented. No focal neurological deficits. Extremities: Symmetric 5 x 5 power. Skin: No rashes, lesions or ulcers Psychiatry: Judgement and insight appear normal. Mood & affect appropriate.     Data Reviewed: I have personally reviewed following labs and imaging studies  CBC: Recent Labs  Lab 08/30/19 1134 08/31/19 0527  WBC 29.7* 32.0*  NEUTROABS 25.2*  --   HGB 7.2* 7.1*  HCT 20.8* 19.5*  MCV 113.0* 106.6*  PLT 314 794   Basic Metabolic Panel: Recent Labs  Lab 08/30/19 2041 08/31/19 0219  08/31/19 0527 08/31/19 1048 08/31/19 1352  NA 139 140 141 140 134*  K 5.4* 4.8 4.4 4.2 4.4  CL 103 103 104 104 101  CO2 '26 27 28 27 26  ' GLUCOSE 321* 195* 200* 228* 417*  BUN 56* 54* 55* 53* 54*  CREATININE 1.79* 1.63* 1.77* 1.61* 1.67*  CALCIUM 9.4 9.0 9.1 8.9 8.7*   GFR: Estimated Creatinine Clearance: 39.2 mL/min (A) (by C-G formula based on SCr of 1.67 mg/dL (H)). Liver Function Tests: Recent Labs  Lab 08/30/19 1134 08/31/19 0527  AST 50* 51*  ALT 92* 78*  ALKPHOS 181* 136*  BILITOT 9.5* 7.5*  PROT 9.7* 9.2*  ALBUMIN 3.4* 3.0*   Recent Labs  Lab 08/30/19 1134  LIPASE 219*   No results for input(s): AMMONIA in the last 168 hours. Coagulation Profile: No results for input(s): INR, PROTIME in the last 168 hours. Cardiac Enzymes: No results for input(s): CKTOTAL, CKMB, CKMBINDEX, TROPONINI in the last 168 hours. BNP (last 3 results) No results for input(s): PROBNP in the last 8760 hours. HbA1C: Recent Labs    08/30/19 2041 08/31/19 1352  HGBA1C 7.5* 7.8*   CBG: Recent Labs  Lab 08/31/19 1639 08/31/19 2124 09/01/19 0630 09/01/19 1025 09/01/19 1122  GLUCAP 151* 245* 261* 163* 97   Lipid  Profile: Recent Labs    08/31/19 0527  CHOL 170  HDL 16*  LDLCALC 135*  TRIG 96  CHOLHDL 10.6   Thyroid Function Tests: No results for input(s): TSH, T4TOTAL, FREET4, T3FREE, THYROIDAB in the last 72 hours. Anemia Panel: Recent Labs    08/30/19 2041  VITAMINB12 1,287*  FOLATE 35.8  FERRITIN >7,500*  TIBC 143*  IRON 163  RETICCTPCT 5.1*   Sepsis Labs: No results for input(s): PROCALCITON, LATICACIDVEN in the last 168 hours.  Recent Results (from the past 240 hour(s))  Group A Strep by PCR     Status: None   Collection Time: 08/30/19 11:34 AM   Specimen: Throat; Sterile Swab  Result Value Ref Range Status   Group A Strep by PCR NOT DETECTED NOT DETECTED Final    Comment: Performed at [facility], [address] [place], [place] [address]    SARS Coronavirus 2 by RT PCR (hospital order, performed in [facility] hospital lab) Nasopharyngeal Nasopharyngeal Swab     Status: None   Collection Time: 08/30/19  1:34 PM   Specimen: Nasopharyngeal Swab  Result Value Ref Range Status   SARS Coronavirus 2 NEGATIVE NEGATIVE Final    Comment: (NOTE) SARS-CoV-2 target nucleic acids are NOT DETECTED.  The SARS-CoV-2 RNA is generally detectable in upper and lower respiratory specimens during the acute phase of infection. The lowest concentration of SARS-CoV-2 viral copies this assay can detect is 250 copies / mL. A negative result does not preclude SARS-CoV-2 infection and should not be used as the sole basis for treatment or other patient management decisions.  A negative result may occur with improper specimen collection / handling, submission of specimen other than nasopharyngeal swab, presence of viral mutation(s) within the areas targeted by this assay, and inadequate number of viral copies (<250 copies / mL). A negative result must be combined with clinical observations, patient history, and epidemiological information.  Fact Sheet for Patients:   StrictlyIdeas.no  Fact Sheet for Healthcare Providers: BankingDealers.co.za  This test is not yet approved or  cleared by the [place] FDA and has been authorized for detection and/or diagnosis of SARS-CoV-2 by FDA under an Emergency Use Authorization (EUA).  This EUA will remain in effect (meaning this test can be used) for the duration of the COVID-19 declaration under Section 564(b)(1) of the Act, 21 U.S.C. section 360bbb-3(b)(1), unless the authorization is terminated or revoked sooner.  Performed at [facility], [address], [place], [place] [address]          Radiology Studies: US Abdomen Limited RUQ  Result Date: 08/31/2019 CLINICAL DATA:  Right upper quadrant pain EXAM: ULTRASOUND ABDOMEN LIMITED  RIGHT UPPER QUADRANT COMPARISON:  None. FINDINGS: Gallbladder: The patient is status post cholecystectomy. Common bile duct: Diameter: 4 mm Liver: No focal lesion identified. There is mild hepatomegaly. Within normal limits in parenchymal echogenicity. Portal vein is patent on color Doppler imaging with normal direction of blood flow towards the liver. Other: None. IMPRESSION: Mild hepatomegaly.  No focal hepatic lesion. Status post cholecystectomy Electronically Signed   By: Prudencio Pair M.D.   On: 08/31/2019 00:16        Scheduled Meds: . Chlorhexidine Gluconate Cloth  6 each Topical Daily  . digoxin  0.125 mg Oral Daily  . folic acid  1 mg Oral Daily  . heparin  5,000 Units Subcutaneous Q8H  . hydroxyurea  1,000 mg Oral Daily  . insulin aspart  0-20 Units  Subcutaneous TID WC  . insulin aspart  5 Units Subcutaneous TID WC  . insulin glargine  20 Units Subcutaneous QHS  . insulin starter kit- pen needles  1 kit Other Once   Continuous Infusions: . sodium chloride 500 mL (08/30/19 1421)  . sodium chloride    . sodium chloride 75 mL/hr at 08/30/19 2104  . dextrose 5 % and 0.45% NaCl 75 mL/hr at 08/31/19 0020  . diltiazem (CARDIZEM) infusion 15 mg/hr (09/01/19 1344)     LOS: 2 days      Georgette Shell, MD 09/01/2019, 2:08 PM

## 2019-09-02 LAB — CBC
HCT: 18.3 % — ABNORMAL LOW (ref 36.0–46.0)
Hemoglobin: 6.6 g/dL — CL (ref 12.0–15.0)
MCH: 38.6 pg — ABNORMAL HIGH (ref 26.0–34.0)
MCHC: 36.1 g/dL — ABNORMAL HIGH (ref 30.0–36.0)
MCV: 107 fL — ABNORMAL HIGH (ref 80.0–100.0)
Platelets: 226 10*3/uL (ref 150–400)
RBC: 1.71 MIL/uL — ABNORMAL LOW (ref 3.87–5.11)
RDW: 16.5 % — ABNORMAL HIGH (ref 11.5–15.5)
WBC: 26.9 10*3/uL — ABNORMAL HIGH (ref 4.0–10.5)
nRBC: 1 % — ABNORMAL HIGH (ref 0.0–0.2)

## 2019-09-02 LAB — HEPATITIS PANEL, ACUTE
HCV Ab: NONREACTIVE
Hep A IgM: NONREACTIVE
Hep B C IgM: NONREACTIVE
Hepatitis B Surface Ag: NONREACTIVE

## 2019-09-02 LAB — GLUCOSE, CAPILLARY
Glucose-Capillary: 233 mg/dL — ABNORMAL HIGH (ref 70–99)
Glucose-Capillary: 238 mg/dL — ABNORMAL HIGH (ref 70–99)
Glucose-Capillary: 171 mg/dL — ABNORMAL HIGH (ref 70–99)
Glucose-Capillary: 164 mg/dL — ABNORMAL HIGH (ref 70–99)

## 2019-09-02 LAB — DIC (DISSEMINATED INTRAVASCULAR COAGULATION)PANEL
D-Dimer, Quant: 2.64 ug/mL-FEU — ABNORMAL HIGH (ref 0.00–0.50)
Fibrinogen: 351 mg/dL (ref 210–475)
INR: 1.3 — ABNORMAL HIGH (ref 0.8–1.2)
Platelets: 199 10*3/uL (ref 150–400)
Prothrombin Time: 16 seconds — ABNORMAL HIGH (ref 11.4–15.2)
Smear Review: NONE SEEN
aPTT: 65 seconds — ABNORMAL HIGH (ref 24–36)

## 2019-09-02 LAB — COMPREHENSIVE METABOLIC PANEL
ALT: 79 U/L — ABNORMAL HIGH (ref 0–44)
AST: 77 U/L — ABNORMAL HIGH (ref 15–41)
Albumin: 2.8 g/dL — ABNORMAL LOW (ref 3.5–5.0)
Alkaline Phosphatase: 116 U/L (ref 38–126)
Anion gap: 7 (ref 5–15)
BUN: 35 mg/dL — ABNORMAL HIGH (ref 6–20)
CO2: 26 mmol/L (ref 22–32)
Calcium: 8.9 mg/dL (ref 8.9–10.3)
Chloride: 100 mmol/L (ref 98–111)
Creatinine, Ser: 1.4 mg/dL — ABNORMAL HIGH (ref 0.44–1.00)
GFR calc Af Amer: 57 mL/min — ABNORMAL LOW (ref >60)
GFR calc non Af Amer: 50 mL/min — ABNORMAL LOW (ref >60)
Glucose, Bld: 187 mg/dL — ABNORMAL HIGH (ref 70–99)
Potassium: 4.3 mmol/L (ref 3.5–5.1)
Sodium: 133 mmol/L — ABNORMAL LOW (ref 135–145)
Total Bilirubin: 11.5 mg/dL — ABNORMAL HIGH (ref 0.3–1.2)
Total Protein: 8.4 g/dL — ABNORMAL HIGH (ref 6.5–8.1)

## 2019-09-02 LAB — PREPARE RBC (CROSSMATCH)

## 2019-09-02 MED ORDER — SODIUM CHLORIDE 0.9% IV SOLUTION: Freq: Once | INTRAVENOUS | Status: DC

## 2019-09-02 MED ORDER — DIPHENHYDRAMINE HCL 25 MG PO CAPS
25.0000 mg | ORAL_CAPSULE | Freq: Once | ORAL | Status: AC
  Administered 2019-09-02: 25 mg via ORAL
  Filled 2019-09-02: qty 1

## 2019-09-02 NOTE — Progress Notes (Signed)
MD instruct ti titrate cardizem down to off. Patient off cardiezm drip HR sustain in 90s.. will continue to monitor the patient.

## 2019-09-02 NOTE — Plan of Care (Signed)
  Problem: Health Behavior/Discharge Planning: Goal: Ability to manage health-related needs will improve Outcome: Progressing   Problem: Clinical Measurements: Goal: Ability to maintain clinical measurements within normal limits will improve Outcome: Progressing   

## 2019-09-02 NOTE — Progress Notes (Addendum)
PROGRESS NOTE    Pamela Hudson  NFA:[id number] DOB: [birth year] DOA: 08/30/2019 PCP: Curt Jews, PA-C  Brief Narrative: HPI per Dr. Clois Dupes [name] a 34 y.o.femalewith medical history significant forpulmonary hypertension with chronic right-sided heart failure, CKD stage III, sickle cell disease with anemia,andhypertensionwho presented to [facility] for evaluation of generalized weakness and polydipsia. History is limited from patient due to excessive somnolence therefore majority of history is obtained from EDP and chart review.  Patient reportedly presented to [place] ED for evaluation of generalized weakness and polydipsia. 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 She has been having polyuria without dysuria. She reported generalized abdominal pain without nausea or vomiting. At time of evaluation upon transfer to [facility] patient is excessively somnolent and unable to provide further history.  [facility] HPED Course: Initial vitals showed BP 113/51, pulse 109, RR 18, temp 97.9 Fahrenheit, SPO2 100% on room air.  Labs are notable for serum glucose 1185, sodium 124 (150 when corrected for hyperglycemia), potassium 5.8, chloride 86, bicarb 24, BUN 66, creatinine 1.87, AST 50, ALT 92, alk phos 181, total bilirubin 9.5, WBC 29.7, hemoglobin 7.2, hematocrit 20.8, MCV 113, platelets 314,000, lipase 219, serum osmolality 364.  SARS-CoV-2 PCR is negative. Urinalysis shows >500 glucose, negative ketones, negative nitrites, negative leukocytes, 0-5 RBC/hpf, 0-5 WBC/hpf, few bacteria on microscopy. Urine pregnancy test was negative. Mono screen is negative. Group A strep PCR is negative.  Portable chest x-ray shows right Port-A-Cath with tip at the cavoatrial junction, no focal consolidation, edema, or effusion. Patient was given 1 L LR, 1 L NS, and started on  insulin infusion. The hospitalist service was consulted to transfer for admission and further management  Assessment & Plan:   Active Problems:   Sickle cell anemia (HCC)   Essential hypertension   Hyperkalemia   Transaminitis   Hyperosmolar hyperglycemic state (HHS) (HCC)   Chronic right-sided heart failure (HCC)   Pulmonary hypertension (HCC)   CKD (chronic kidney disease) stage 3, GFR 30-59 ml/min   Diabetes mellitus due to underlying condition with hyperosmolarity without coma, without long-term current use of insulin ([place])   #1 new onset diabetes admitted with hyperglycemia-patient was initially started on insulin drip which has been DC'd and started Lantus with resistant sliding scale insulin and NovoLog prior to meals. Blood sugar better and stabilizing. Appreciate diabetic coordinator and dietitian's input. hbglobin A1c7.5 CBG (last 3)  Recent Labs    09/01/19 1637 09/01/19 2102 09/02/19 0614  GLUCAP 291* 114* 233*    #2 A. fib RVR started on Cardizem drip Taper and DC the Cardizem drip.  Check TSH pending.  Started on digoxin yesterday.  #3 history of pulmonary hypertension and chronic right-sided heart failure with ejection fraction 55 to 60% and mild pulmonary hypertension. Patient will follow up with [facility] cardiology as an outpatient. She is on torsemide at home which was on hold .  Chest x-ray from yesterday still shows pulmonary vascular congestion will restart diuretics.  #4 CKD stage III stable continue to monitor  #5 history of essential hypertension - bp 109/58 on Cardizem monitor closely.  #6 history of sickle cell disease on Hydrea  #7 anemia of chronic disease hemoglobin 6.8 transfuse 1 unit of packed RBC.  Check FOBT.    #8 elevated LFTs-elevated total bilirubin AST ALT and alkaline phosphatase.  Rule out DIC though it is less likely.  Check  ANA AMA ASMA alpha-1 antitrypsin ceruloplasmin and hepatitis panel and DIC panel.   RUQ US-s/p chole  no focal lesion.  #9 hyperlipidemia LDL 135 triglycerides 96hold statindue to elevated lfts  #10 elevated lipasestarted soft diet  #11 leukocytosis could still likely be secondary to hyperosmolar nonketotic state.  UA negative for UTI chest x-ray shows pulmonary vascular congestion.afebrile will monitor.  Improving without antibiotics.  #2 oral thrush nystatin.   Estimated body mass index is 28.7 kg/m as calculated from the following:   Height as of this encounter: _0  (1.499 m).   Weight as of this encounter: 64.5 kg.   DVT prophylaxis: Heparin  code Status: Full code Family Communication: Discussed with patient Disposition Plan:  Status is: Inpatient  Dispo: The patient is from:home  Anticipated d/c is to:hom e  Anticipated d/c date is: 1 to 2 days  Patient currently is not medically stable to d/c.   Consultants:  none  Procedures: None Antimicrobials none Subjective: Patient sitting up in bed no new complaints overnight remains on Cardizem drip denies any chest pain or palpitations nausea vomiting abdominal pain diarrhea. Objective: Vitals:   09/02/19 0400 09/02/19 0500 09/02/19 0539 09/02/19 0600  BP:   (!) 109/58   Pulse:   94   Resp: (!) 21 (!) 21 17 (!) 23  Temp:   99.5 F (37.5 C)   TempSrc:   Oral   SpO2:   91%   Weight:      Height:        Intake/Output Summary (Last 24 hours) at 09/02/2019 1053 Last data filed at 09/02/2019 0700 Gross per 24 hour  Intake 766.13 ml  Output 1650 ml  Net -883.87 ml   Filed Weights   08/31/19 0514 09/01/19 0350 09/02/19 0049  Weight: 62.6 kg 63.8 kg 64.5 kg    Examination:  General exam: Appears calm and comfortable  Respiratory system: Clear to auscultation. Respiratory effort normal.  Rales at the bases Cardiovascular system: S1 & S2 heard, RRR. No JVD, murmurs, rubs, gallops or clicks. No pedal edema. Gastrointestinal system: Abdomen is nondistended, soft and  nontender. No organomegaly or masses felt. Normal bowel sounds heard.  Umbilical hernia Central nervous system: Alert and oriented. No focal neurological deficits. Extremities: Symmetric 5 x 5 power. Skin: No rashes, lesions or ulcers Psychiatry: Judgement and insight appear normal. Mood & affect appropriate.     Data Reviewed: I have personally reviewed following labs and imaging studies  CBC: Recent Labs  Lab 08/30/19 1134 08/31/19 0527 09/02/19 0702  WBC 29.7* 32.0* 26.9*  NEUTROABS 25.2*  --   --   HGB 7.2* 7.1* 6.6*  HCT 20.8* 19.5* 18.3*  MCV 113.0* 106.6* 107.0*  PLT 314 268 741   Basic Metabolic Panel: Recent Labs  Lab 08/31/19 0219 08/31/19 0527 08/31/19 1048 08/31/19 1352 09/02/19 0702  NA 140 141 140 134* 133*  K 4.8 4.4 4.2 4.4 4.3  CL 103 104 104 101 100  CO2 _1 GLUCOSE 195* 200* 228* 417* 187*  BUN 54* 55* 53* 54* 35*  CREATININE 1.63* 1.77* 1.61* 1.67* 1.40*  CALCIUM 9.0 9.1 8.9 8.7* 8.9   GFR: Estimated Creatinine Clearance: 47.1 mL/min (A) (by C-G formula based on SCr of 1.4 mg/dL (H)). Liver Function Tests: Recent Labs  Lab 08/30/19 1134 08/31/19 0527 09/02/19 0702  AST 50* 51* 77*  ALT 92* 78* 79*  ALKPHOS 181* 136* 116  BILITOT 9.5* 7.5* 11.5*  PROT 9.7* 9.2* 8.4*  ALBUMIN 3.4* 3.0* 2.8*   Recent Labs  Lab 08/30/19 1134  LIPASE 219*   No results for input(s): AMMONIA in the last 168 hours. Coagulation Profile: No results for input(s): INR, PROTIME in the last 168 hours. Cardiac Enzymes: No results for input(s): CKTOTAL, CKMB, CKMBINDEX, TROPONINI in the last 168 hours. BNP (last 3 results) No results for input(s): PROBNP in the last 8760 hours. HbA1C: Recent Labs    08/30/19 2041 08/31/19 1352  HGBA1C 7.5* 7.8*   CBG: Recent Labs  Lab 09/01/19 1025 09/01/19 1122 09/01/19 1637 09/01/19 2102 09/02/19 0614  GLUCAP 163* 97 291* 114* 233*   Lipid Profile: Recent Labs    08/31/19 0527  CHOL 170  HDL  16*  LDLCALC 135*  TRIG 96  CHOLHDL 10.6   Thyroid Function Tests: No results for input(s): TSH, T4TOTAL, FREET4, T3FREE, THYROIDAB in the last 72 hours. Anemia Panel: Recent Labs    08/30/19 2041  VITAMINB12 1,287*  FOLATE 35.8  FERRITIN >7,500*  TIBC 143*  IRON 163  RETICCTPCT 5.1*   Sepsis Labs: No results for input(s): PROCALCITON, LATICACIDVEN in the last 168 hours.  Recent Results (from the past 240 hour(s))  Group A Strep by PCR     Status: None   Collection Time: 08/30/19 11:34 AM   Specimen: Throat; Sterile Swab  Result Value Ref Range Status   Group A Strep by PCR NOT DETECTED NOT DETECTED Final    Comment: Performed at [facility], [address] [place], [place] [address]  SARS Coronavirus 2 by RT PCR (hospital order, performed in [facility] hospital lab) Nasopharyngeal Nasopharyngeal Swab     Status: None   Collection Time: 08/30/19  1:34 PM   Specimen: Nasopharyngeal Swab  Result Value Ref Range Status   SARS Coronavirus 2 NEGATIVE NEGATIVE Final    Comment: (NOTE) SARS-CoV-2 target nucleic acids are NOT DETECTED.  The SARS-CoV-2 RNA is generally detectable in upper and lower respiratory specimens during the acute phase of infection. The lowest concentration of SARS-CoV-2 viral copies this assay can detect is 250 copies / mL. A negative result does not preclude SARS-CoV-2 infection and should not be used as the sole basis for treatment or other patient management decisions.  A negative result may occur with improper specimen collection / handling, submission of specimen other than nasopharyngeal swab, presence of viral mutation(s) within the areas targeted by this assay, and inadequate number of viral copies (<250 copies / mL). A negative result must be combined with clinical observations, patient history, and epidemiological information.  Fact Sheet for Patients:   StrictlyIdeas.no  Fact Sheet for Healthcare  Providers: BankingDealers.co.za  This test is not yet approved or  cleared by the [place] FDA and has been authorized for detection and/or diagnosis of SARS-CoV-2 by FDA under an Emergency Use Authorization (EUA).  This EUA will remain in effect (meaning this test can be used) for the duration of the COVID-19 declaration under Section 564(b)(1) of the Act, 21 U.S.C. section 360bbb-3(b)(1), unless the authorization is terminated or revoked sooner.  Performed at [facility], [address], [place],  [address]          Radiology Studies: No results found.      Scheduled Meds: . sodium chloride   Intravenous Once  . Chlorhexidine Gluconate Cloth  6 each Topical Daily  . digoxin  0.125 mg Oral Daily  . folic acid  1 mg Oral Daily  . furosemide  20 mg Intravenous Daily  . heparin  5,000 Units Subcutaneous Q8H  . hydroxyurea  1,000 mg Oral Daily  . insulin aspart  0-20 Units Subcutaneous TID WC  . insulin aspart  5 Units Subcutaneous TID WC  . insulin glargine  30 Units Subcutaneous QHS  . insulin starter kit- pen needles  1 kit Other Once  . nystatin  5 mL Oral QID   Continuous Infusions: . sodium chloride 500 mL (08/30/19 1421)  . sodium chloride    . diltiazem (CARDIZEM) infusion 10 mg/hr (09/01/19 1432)     LOS: 3 days     Georgette Shell, MD  09/02/2019, 10:53 AM

## 2019-09-02 NOTE — Progress Notes (Signed)
Asked Dr. Joneen Roach if patient needs Heparin SQ since Hgb is 6.6. She ordered to continue to give the heparin as scheduled. Will administer and continue to monitor.

## 2019-09-02 NOTE — Progress Notes (Signed)
Attempted to draw am labs but was unsuccessful. Did not get any blood return. Informed Public relations account executive.

## 2019-09-03 LAB — CBC WITH DIFFERENTIAL/PLATELET
Abs Immature Granulocytes: 0.22 10*3/uL — ABNORMAL HIGH (ref 0.00–0.07)
Basophils Absolute: 0.1 10*3/uL (ref 0.0–0.1)
Basophils Relative: 1 %
Eosinophils Absolute: 0.4 10*3/uL (ref 0.0–0.5)
Eosinophils Relative: 2 %
HCT: 20.2 % — ABNORMAL LOW (ref 36.0–46.0)
Hemoglobin: 7.2 g/dL — ABNORMAL LOW (ref 12.0–15.0)
Immature Granulocytes: 1 %
Lymphocytes Relative: 17 %
Lymphs Abs: 4.1 10*3/uL — ABNORMAL HIGH (ref 0.7–4.0)
MCH: 36.7 pg — ABNORMAL HIGH (ref 26.0–34.0)
MCHC: 35.6 g/dL (ref 30.0–36.0)
MCV: 103.1 fL — ABNORMAL HIGH (ref 80.0–100.0)
Monocytes Absolute: 2 10*3/uL — ABNORMAL HIGH (ref 0.1–1.0)
Monocytes Relative: 8 %
Neutro Abs: 17.8 10*3/uL — ABNORMAL HIGH (ref 1.7–7.7)
Neutrophils Relative %: 71 %
Platelets: 166 10*3/uL (ref 150–400)
RBC: 1.96 MIL/uL — ABNORMAL LOW (ref 3.87–5.11)
RDW: 18.2 % — ABNORMAL HIGH (ref 11.5–15.5)
WBC: 24.7 10*3/uL — ABNORMAL HIGH (ref 4.0–10.5)
nRBC: 0.8 % — ABNORMAL HIGH (ref 0.0–0.2)

## 2019-09-03 LAB — TSH: TSH: 3.651 u[IU]/mL (ref 0.350–4.500)

## 2019-09-03 LAB — GLUCOSE, CAPILLARY
Glucose-Capillary: 201 mg/dL — ABNORMAL HIGH (ref 70–99)
Glucose-Capillary: 176 mg/dL — ABNORMAL HIGH (ref 70–99)
Glucose-Capillary: 168 mg/dL — ABNORMAL HIGH (ref 70–99)
Glucose-Capillary: 161 mg/dL — ABNORMAL HIGH (ref 70–99)
Glucose-Capillary: 104 mg/dL — ABNORMAL HIGH (ref 70–99)

## 2019-09-03 LAB — COMPREHENSIVE METABOLIC PANEL
ALT: 75 U/L — ABNORMAL HIGH (ref 0–44)
AST: 76 U/L — ABNORMAL HIGH (ref 15–41)
Albumin: 2.9 g/dL — ABNORMAL LOW (ref 3.5–5.0)
Alkaline Phosphatase: 147 U/L — ABNORMAL HIGH (ref 38–126)
Anion gap: 9 (ref 5–15)
BUN: 25 mg/dL — ABNORMAL HIGH (ref 6–20)
CO2: 24 mmol/L (ref 22–32)
Calcium: 9 mg/dL (ref 8.9–10.3)
Chloride: 101 mmol/L (ref 98–111)
Creatinine, Ser: 1.31 mg/dL — ABNORMAL HIGH (ref 0.44–1.00)
GFR calc Af Amer: >60 mL/min (ref >60)
GFR calc non Af Amer: 54 mL/min — ABNORMAL LOW (ref >60)
Glucose, Bld: 173 mg/dL — ABNORMAL HIGH (ref 70–99)
Potassium: 5 mmol/L (ref 3.5–5.1)
Sodium: 134 mmol/L — ABNORMAL LOW (ref 135–145)
Total Bilirubin: 12.4 mg/dL — ABNORMAL HIGH (ref 0.3–1.2)
Total Protein: 9.2 g/dL — ABNORMAL HIGH (ref 6.5–8.1)

## 2019-09-03 LAB — PREPARE RBC (CROSSMATCH)

## 2019-09-03 LAB — ANTI-SMOOTH MUSCLE ANTIBODY, IGG: F-Actin IgG: 26 Units — ABNORMAL HIGH (ref 0–19)

## 2019-09-03 LAB — ANA: Anti Nuclear Antibody (ANA): NEGATIVE

## 2019-09-03 LAB — ALPHA-1-ANTITRYPSIN: A-1 Antitrypsin, Ser: 162 mg/dL (ref 100–188)

## 2019-09-03 LAB — CERULOPLASMIN: Ceruloplasmin: 24 mg/dL (ref 19.0–39.0)

## 2019-09-03 LAB — MITOCHONDRIAL ANTIBODIES: Mitochondrial M2 Ab, IgG: <20 Units (ref 0.0–20.0)

## 2019-09-03 MED ORDER — DIPHENHYDRAMINE HCL 25 MG PO CAPS
25.0000 mg | ORAL_CAPSULE | Freq: Four times a day (QID) | ORAL | Status: DC | PRN
  Administered 2019-09-03: 25 mg via ORAL
  Filled 2019-09-03: qty 1

## 2019-09-03 MED ORDER — MAGNESIUM OXIDE 400 (241.3 MG) MG PO TABS
800.0000 mg | ORAL_TABLET | Freq: Every day | ORAL | Status: DC
  Administered 2019-09-03 – 2019-09-04 (×2): 800 mg via ORAL
  Filled 2019-09-03 (×4): qty 2

## 2019-09-03 MED ORDER — SODIUM CHLORIDE 0.9% IV SOLUTION: Freq: Once | INTRAVENOUS | Status: AC

## 2019-09-03 MED ORDER — VITAMIN D (ERGOCALCIFEROL) 1.25 MG (50000 UNIT) PO CAPS: 100000.0000 [IU] | ORAL_CAPSULE | ORAL | Status: DC

## 2019-09-03 MED ORDER — SODIUM CHLORIDE 0.9 % IV SOLN
510.0000 mg | Freq: Once | INTRAVENOUS | Status: DC
  Filled 2019-09-03: qty 17

## 2019-09-03 MED ORDER — LIP MEDEX EX OINT
TOPICAL_OINTMENT | CUTANEOUS | Status: DC | PRN
  Filled 2019-09-03: qty 7

## 2019-09-03 MED ORDER — DEFERASIROX 360 MG PO TABS
1080.0000 mg | ORAL_TABLET | Freq: Every day | ORAL | Status: DC
  Administered 2019-09-03: 1080 mg via ORAL
  Filled 2019-09-03 (×2): qty 3

## 2019-09-03 NOTE — Progress Notes (Signed)
Inpatient Diabetes Program Recommendations  AACE/ADA: New Consensus Statement on Inpatient Glycemic Control (2015)  Target Ranges:  Prepandial:   less than 140 mg/dL      Peak postprandial:   less than 180 mg/dL (1-2 hours)      Critically ill patients:  140 - 180 mg/dL   Lab Results  Component Value Date   GLUCAP 176 (H) 09/03/2019   HGBA1C 7.8 (H) 08/31/2019    Review of Glycemic Control Results for Pamela Hudson, Pamela Hudson (MRN [id number]) as of 09/03/2019 09:18  Ref. Range 09/02/2019 16:48 09/02/2019 21:07 09/03/2019 06:11 09/03/2019 07:54  Glucose-Capillary Latest Ref Range: 70 - 99 mg/dL 643 (H) 837 (H) 793 (H) 176 (H)   Diabetes history: New onset Outpatient Diabetes medications: none Current orders for Inpatient glycemic control: Novolog 5 units TID, Novolog 0-20 units TID, Lantus 20 units QHS  Inpatient Diabetes Program Recommendations:    Consider increasing Lantus to 36 units QHS.    Thanks, Lujean Rave, MSN, RNC-OB Diabetes Coordinator [phone] (8a-5p)

## 2019-09-03 NOTE — Progress Notes (Signed)
PROGRESS NOTE    Pamela Hudson  RFF:[id number] DOB: 1985-08-18 DOA: 08/30/2019 PCP: Curt Jews, PA-C  Brief Narrative: HPI per Dr. Clois Dupes Hudson a 34 y.o.femalewith medical history significant forpulmonary hypertension with chronic right-sided heart failure, CKD stage III, sickle cell disease with anemia,andhypertensionwho presented to [facility] ED for evaluation of generalized weakness and polydipsia. History is limited from patient due to excessive somnolence therefore majority of history is obtained from EDP and chart review.  Patient reportedly presented to [place] ED for evaluation of generalized weakness and polydipsia. 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 She has been having polyuria without dysuria. She reported generalized abdominal pain without nausea or vomiting. At time of evaluation upon transfer to [facility] patient is excessively somnolent and unable to provide further history.  [facility] HPED Course: Initial vitals showed BP 113/51, pulse 109, RR 18, temp 97.9 Fahrenheit, SPO2 100% on room air.  Labs are notable for serum glucose 1185, sodium 124 (150 when corrected for hyperglycemia), potassium 5.8, chloride 86, bicarb 24, BUN 66, creatinine 1.87, AST 50, ALT 92, alk phos 181, total bilirubin 9.5, WBC 29.7, hemoglobin 7.2, hematocrit 20.8, MCV 113, platelets 314,000, lipase 219, serum osmolality 364.  SARS-CoV-2 PCR is negative. Urinalysis shows >500 glucose, negative ketones, negative nitrites, negative leukocytes, 0-5 RBC/hpf, 0-5 WBC/hpf, few bacteria on microscopy. Urine pregnancy test was negative. Mono screen is negative. Group A strep PCR is negative.  Portable chest x-ray shows right Port-A-Cath with tip at the cavoatrial junction, no focal consolidation, edema, or effusion. Patient was given 1 L LR, 1 L NS, and started on  insulin infusion. The hospitalist service was consulted to transfer for admission and further management  Assessment & Plan:   Active Problems:   Sickle cell anemia (HCC)   Essential hypertension   Hyperkalemia   Transaminitis   Hyperosmolar hyperglycemic state (HHS) (HCC)   Chronic right-sided heart failure (HCC)   Pulmonary hypertension (HCC)   CKD (chronic kidney disease) stage 3, GFR 30-59 ml/min   Diabetes mellitus due to underlying condition with hyperosmolarity without coma, without long-term current use of insulin ([place])   #1 new onset diabetes admitted with hyperglycemia-patient was initially started on insulin drip which has been DC'd and started Lantus with resistant sliding scale insulin and NovoLog prior to meals. Blood sugar better and stabilizing. Appreciate diabetic coordinator and dietitian's input. hbglobin A1c7.5 CBG (last 3)  Recent Labs    09/03/19 0611 09/03/19 0754 09/03/19 1157  GLUCAP 201* 176* 168*     #2 A. fib RVR -Cardizem drip stopped yesterday.  Rate controlled on digoxin.  Marland Kitchen  Check TSH 3.65   #3 history of pulmonary hypertension and chronic right-sided heart failure with ejection fraction 55 to 60% and mild pulmonary hypertension. Patient will follow up with [facility] cardiology as an outpatient. She is on torsemide at home which was on hold .  Chest x-ray from  shows pulmonary vascular congestion continue diuretics.  #4 CKD stage III stable continue to monitor  #5 history of essential hypertension -blood pressure 97/56 hold Lasix  #6 history of sickle cell disease on Hydrea  #7 anemia of chronic disease hemoglobin 6.8 transfuse 1 unit of packed RBC.  Check FOBT.  FOBT not done.  Hemoglobin 7.2 after 1 unit of packed RBC  #8 elevated LFTs-elevated total bilirubin AST ALT and alkaline phosphatase.  Rule out DIC though it  is less likely.  Check ANA AMA ASMA alpha-1 antitrypsin ANA negative antimitochondrial antibody less than 20, D-dimer  2.64 fibrinogen normal INR 1.3 PTT 65.  Ceruloplasmin and hepatitis panel pending    RUQ US-s/p chole no focal lesion.  #9 hyperlipidemia LDL 135 triglycerides 96hold statindue to elevated lfts  #10 elevated lipasestarted soft diet  #11 leukocytosis could still likely be secondary to hyperosmolar nonketotic state.  UA negative for UTI chest x-ray shows pulmonary vascular congestion.afebrile will monitor.  Improving without antibiotics.  #2 oral thrush nystatin.   Estimated body mass index is 28.78 kg/m as calculated from the following:   Height as of this encounter: _0  (1.499 m).   Weight as of this encounter: 64.6 kg.   DVT prophylaxis: Heparin  code Status: Full code Family Communication: Discussed with patient Disposition Plan:  Status is: Inpatient  Dispo: The patient is from:home  Anticipated d/c is to:hom e  Anticipated d/c date is: 1 to 2 days  Patient currently is not medically stable to d/c.   Consultants:  none  Procedures: None Antimicrobials none Subjective: Resting in bed reports that she did not sleep well at night Feels stronger after blood transfusion objective: Vitals:   09/03/19 0815 09/03/19 1000 09/03/19 1005 09/03/19 1100  BP:      Pulse:   95   Resp:  18    Temp: 99.2 F (37.3 C)   98.8 F (37.1 C)  TempSrc: Oral   Oral  SpO2:   100%   Weight:      Height:        Intake/Output Summary (Last 24 hours) at 09/03/2019 1355 Last data filed at 09/03/2019 1317 Gross per 24 hour  Intake 1329.04 ml  Output 1000 ml  Net 329.04 ml   Filed Weights   09/01/19 0350 09/02/19 0049 09/03/19 0419  Weight: 63.8 kg 64.5 kg 64.6 kg    Examination:  General exam: Appears calm and comfortable  Respiratory system: Clear to auscultation. Respiratory effort normal.  Rales at the bases Cardiovascular system: S1 & S2 heard, RRR. No JVD, murmurs, rubs, gallops or clicks. No pedal edema. Gastrointestinal system:  Abdomen is nondistended, soft and nontender. No organomegaly or masses felt. Normal bowel sounds heard.  Umbilical hernia Central nervous system: Alert and oriented. No focal neurological deficits. Extremities: Symmetric 5 x 5 power. Skin: No rashes, lesions or ulcers Psychiatry: Judgement and insight appear normal. Mood & affect appropriate.     Data Reviewed: I have personally reviewed following labs and imaging studies  CBC: Recent Labs  Lab 08/30/19 1134 08/31/19 0527 09/02/19 0702 09/02/19 1841 09/03/19 0630  WBC 29.7* 32.0* 26.9*  --  24.7*  NEUTROABS 25.2*  --   --   --  17.8*  HGB 7.2* 7.1* 6.6*  --  7.2*  HCT 20.8* 19.5* 18.3*  --  20.2*  MCV 113.0* 106.6* 107.0*  --  103.1*  PLT 314 268 226 199 081   Basic Metabolic Panel: Recent Labs  Lab 08/31/19 0219 08/31/19 0527 08/31/19 1048 08/31/19 1352 09/02/19 0702  NA 140 141 140 134* 133*  K 4.8 4.4 4.2 4.4 4.3  CL 103 104 104 101 100  CO2 _1 GLUCOSE 195* 200* 228* 417* 187*  BUN 54* 55* 53* 54* 35*  CREATININE 1.63* 1.77* 1.61* 1.67* 1.40*  CALCIUM 9.0 9.1 8.9 8.7* 8.9   GFR: Estimated Creatinine Clearance: 47.2 mL/min (A) (by C-G formula based on SCr of 1.4 mg/dL (H)).  Liver Function Tests: Recent Labs  Lab 08/30/19 1134 08/31/19 0527 09/02/19 0702  AST 50* 51* 77*  ALT 92* 78* 79*  ALKPHOS 181* 136* 116  BILITOT 9.5* 7.5* 11.5*  PROT 9.7* 9.2* 8.4*  ALBUMIN 3.4* 3.0* 2.8*   Recent Labs  Lab 08/30/19 1134  LIPASE 219*   No results for input(s): AMMONIA in the last 168 hours. Coagulation Profile: Recent Labs  Lab 09/02/19 1841  INR 1.3*   Cardiac Enzymes: No results for input(s): CKTOTAL, CKMB, CKMBINDEX, TROPONINI in the last 168 hours. BNP (last 3 results) No results for input(s): PROBNP in the last 8760 hours. HbA1C: No results for input(s): HGBA1C in the last 72 hours. CBG: Recent Labs  Lab 09/02/19 1648 09/02/19 2107 09/03/19 0611 09/03/19 0754 09/03/19 1157    GLUCAP 171* 164* 201* 176* 168*   Lipid Profile: No results for input(s): CHOL, HDL, LDLCALC, TRIG, CHOLHDL, LDLDIRECT in the last 72 hours. Thyroid Function Tests: Recent Labs    09/03/19 0630  TSH 3.651   Anemia Panel: No results for input(s): VITAMINB12, FOLATE, FERRITIN, TIBC, IRON, RETICCTPCT in the last 72 hours. Sepsis Labs: No results for input(s): PROCALCITON, LATICACIDVEN in the last 168 hours.  Recent Results (from the past 240 hour(s))  Group A Strep by PCR     Status: None   Collection Time: 08/30/19 11:34 AM   Specimen: Throat; Sterile Swab  Result Value Ref Range Status   Group A Strep by PCR NOT DETECTED NOT DETECTED Final    Comment: Performed at [facility], [address] [place], [place] [address]  SARS Coronavirus 2 by RT PCR (hospital order, performed in [facility] hospital lab) Nasopharyngeal Nasopharyngeal Swab     Status: None   Collection Time: 08/30/19  1:34 PM   Specimen: Nasopharyngeal Swab  Result Value Ref Range Status   SARS Coronavirus 2 NEGATIVE NEGATIVE Final    Comment: (NOTE) SARS-CoV-2 target nucleic acids are NOT DETECTED.  The SARS-CoV-2 RNA is generally detectable in upper and lower respiratory specimens during the acute phase of infection. The lowest concentration of SARS-CoV-2 viral copies this assay can detect is 250 copies / mL. A negative result does not preclude SARS-CoV-2 infection and should not be used as the sole basis for treatment or other patient management decisions.  A negative result may occur with improper specimen collection / handling, submission of specimen other than nasopharyngeal swab, presence of viral mutation(s) within the areas targeted by this assay, and inadequate number of viral copies (<250 copies / mL). A negative result must be combined with clinical observations, patient history, and epidemiological information.  Fact Sheet for Patients:    StrictlyIdeas.no  Fact Sheet for Healthcare Providers: BankingDealers.co.za  This test is not yet approved or  cleared by the [place] FDA and has been authorized for detection and/or diagnosis of SARS-CoV-2 by FDA under an Emergency Use Authorization (EUA).  This EUA will remain in effect (meaning this test can be used) for the duration of the COVID-19 declaration under Section 564(b)(1) of the Act, 21 U.S.C. section 360bbb-3(b)(1), unless the authorization is terminated or revoked sooner.  Performed at [facility], [address], [place], [place] [address]          Radiology Studies: No results found.      Scheduled Meds: . sodium chloride   Intravenous Once  . Chlorhexidine Gluconate Cloth  6 each Topical Daily  . digoxin  0.125 mg Oral Daily  .  folic acid  1 mg Oral Daily  . furosemide  20 mg Intravenous Daily  . heparin  5,000 Units Subcutaneous Q8H  . hydroxyurea  1,000 mg Oral Daily  . insulin aspart  0-20 Units Subcutaneous TID WC  . insulin aspart  5 Units Subcutaneous TID WC  . insulin glargine  30 Units Subcutaneous QHS  . insulin starter kit- pen needles  1 kit Other Once  . nystatin  5 mL Oral QID   Continuous Infusions: . sodium chloride 500 mL (08/30/19 1421)  . sodium chloride       LOS: 4 days     Georgette Shell, MD  09/03/2019, 1:55 PM

## 2019-09-03 NOTE — Progress Notes (Signed)
Patient has 1 home med in pharmacy.

## 2019-09-03 NOTE — Significant Event (Signed)
Pt experincing left foot pain that is warm to touch as well as Right toe is swelling and painful. MD aware gave Tylenol.

## 2019-09-03 NOTE — Progress Notes (Signed)
Patient has orders for a blood transfusion and need orders for pre medications before blood admin.

## 2019-09-03 NOTE — Progress Notes (Signed)
Physical Therapy Treatment Patient Details Name: Pamela Hudson MRN: [id number] DOB: 28-Jun-1985 Today's Date: 09/03/2019    History of Present Illness Pt is a 34 y/o female admitted secondary to excessive somnolence. Pt found to have new onset diabetes admitted with hyperglycemia and new onset a-fib. PMH including but not limited to pulmonary hypertension with chronic right-sided heart failure, CKD stage III, sickle cell disease with anemia, and hypertension.     PT Comments    Patient received sitting edge of bed after bathing. Ready to walk. She is wearing walking boot on left LE. Slightly unsteady without any UE assist, therefore asked for walker prior to leaving room. Patient with improved safety with UE assist. She ambulated 250 feet with min guard. No difficulty and states she has rollator at home she can use. HR up to 125 with ambulation this visit. She will continue to benefit from skilled PT while here to improve safety with mobility and to improve strength and endurance.     Follow Up Recommendations  No PT follow up     Equipment Recommendations  None recommended by PT    Recommendations for Other Services       Precautions / Restrictions Precautions Precautions: Fall Precaution Comments: patient wearing walking boot on left foot Restrictions Weight Bearing Restrictions: No    Mobility  Bed Mobility               General bed mobility comments: patient received sitting up on side of bed  Transfers Overall transfer level: Modified independent Equipment used: None                Ambulation/Gait Ambulation/Gait assistance: Min guard Gait Distance (Feet): 250 Feet Assistive device: Rolling walker (2 wheeled) Gait Pattern/deviations: Step-through pattern;Decreased stride length Gait velocity: decreased   General Gait Details: pt with slow, steady gait, used RW due to uneven stepping due to boot on left foot.   Stairs             Wheelchair  Mobility    Modified Rankin (Stroke Patients Only)       Balance Overall balance assessment: Needs assistance Sitting-balance support: Feet supported Sitting balance-Leahy Scale: Good     Standing balance support: Bilateral upper extremity supported;During functional activity Standing balance-Leahy Scale: Fair Standing balance comment: reliant on some UE assist for improved balance.                            Cognition Arousal/Alertness: Awake/alert Behavior During Therapy: WFL for tasks assessed/performed Overall Cognitive Status: Within Functional Limits for tasks assessed                                        Exercises      General Comments        Pertinent Vitals/Pain Pain Assessment: Faces Faces Pain Scale: Hurts a little bit Pain Location: foot Pain Descriptors / Indicators: Sore;Discomfort Pain Intervention(s): Monitored during session    Home Living                      Prior Function            PT Goals (current goals can now be found in the care plan section) Acute Rehab PT Goals Patient Stated Goal: to go home and feel better PT Goal Formulation: With patient Time For Goal  Achievement: 09/15/19 Potential to Achieve Goals: Good Progress towards PT goals: Progressing toward goals    Frequency    Min 3X/week      PT Plan Current plan remains appropriate    Co-evaluation              AM-PAC PT "6 Clicks" Mobility   Outcome Measure  Help needed turning from your back to your side while in a flat bed without using bedrails?: None Help needed moving from lying on your back to sitting on the side of a flat bed without using bedrails?: None Help needed moving to and from a bed to a chair (including a wheelchair)?: None Help needed standing up from a chair using your arms (e.g., wheelchair or bedside chair)?: None Help needed to walk in hospital room?: A Little Help needed climbing 3-5 steps with a  railing? : A Little 6 Click Score: 22    End of Session Equipment Utilized During Treatment: Gait belt Activity Tolerance: Patient tolerated treatment well Patient left: in chair;with call bell/phone within reach Nurse Communication: Mobility status PT Visit Diagnosis: Other abnormalities of gait and mobility (R26.89);Muscle weakness (generalized) (M62.81)     Time: 5883-2549 PT Time Calculation (min) (ACUTE ONLY): 16 min  Charges:  $Gait Training: 8-22 mins                     Avan Gullett, PT, GCS 09/03/19,3:05 PM

## 2019-09-03 NOTE — Progress Notes (Signed)
Blood transfusion completed without any reaction. Will check H & H in 2 hours.

## 2019-09-04 LAB — CBC
HCT: 24.7 % — ABNORMAL LOW (ref 36.0–46.0)
Hemoglobin: 8.6 g/dL — ABNORMAL LOW (ref 12.0–15.0)
MCH: 35 pg — ABNORMAL HIGH (ref 26.0–34.0)
MCHC: 34.8 g/dL (ref 30.0–36.0)
MCV: 100.4 fL — ABNORMAL HIGH (ref 80.0–100.0)
Platelets: 172 10*3/uL (ref 150–400)
RBC: 2.46 MIL/uL — ABNORMAL LOW (ref 3.87–5.11)
RDW: 19.2 % — ABNORMAL HIGH (ref 11.5–15.5)
WBC: 27.7 10*3/uL — ABNORMAL HIGH (ref 4.0–10.5)
nRBC: 0.6 % — ABNORMAL HIGH (ref 0.0–0.2)

## 2019-09-04 LAB — COMPREHENSIVE METABOLIC PANEL
ALT: 63 U/L — ABNORMAL HIGH (ref 0–44)
AST: 65 U/L — ABNORMAL HIGH (ref 15–41)
Albumin: 2.6 g/dL — ABNORMAL LOW (ref 3.5–5.0)
Alkaline Phosphatase: 134 U/L — ABNORMAL HIGH (ref 38–126)
Anion gap: 7 (ref 5–15)
BUN: 28 mg/dL — ABNORMAL HIGH (ref 6–20)
CO2: 24 mmol/L (ref 22–32)
Calcium: 8.7 mg/dL — ABNORMAL LOW (ref 8.9–10.3)
Chloride: 103 mmol/L (ref 98–111)
Creatinine, Ser: 1.47 mg/dL — ABNORMAL HIGH (ref 0.44–1.00)
GFR calc Af Amer: 54 mL/min — ABNORMAL LOW (ref >60)
GFR calc non Af Amer: 47 mL/min — ABNORMAL LOW (ref >60)
Glucose, Bld: 61 mg/dL — ABNORMAL LOW (ref 70–99)
Potassium: 4.9 mmol/L (ref 3.5–5.1)
Sodium: 134 mmol/L — ABNORMAL LOW (ref 135–145)
Total Bilirubin: 11.6 mg/dL — ABNORMAL HIGH (ref 0.3–1.2)
Total Protein: 8.5 g/dL — ABNORMAL HIGH (ref 6.5–8.1)

## 2019-09-04 LAB — BPAM RBC
Blood Product Expiration Date: 202107302359
Blood Product Expiration Date: 202107302359
ISSUE DATE / TIME: 202106202342
ISSUE DATE / TIME: 202106211754
Unit Type and Rh: 1700
Unit Type and Rh: 1700

## 2019-09-04 LAB — TYPE AND SCREEN
ABO/RH(D): B POS
Antibody Screen: NEGATIVE
Unit division: 0
Unit division: 0

## 2019-09-04 LAB — BILIRUBIN, FRACTIONATED(TOT/DIR/INDIR)
Bilirubin, Direct: 5.4 mg/dL — ABNORMAL HIGH (ref 0.0–0.2)
Indirect Bilirubin: 5.7 mg/dL — ABNORMAL HIGH (ref 0.3–0.9)
Total Bilirubin: 11.1 mg/dL — ABNORMAL HIGH (ref 0.3–1.2)

## 2019-09-04 LAB — URIC ACID: Uric Acid, Serum: 8.8 mg/dL — ABNORMAL HIGH (ref 2.5–7.1)

## 2019-09-04 LAB — GLUCOSE, CAPILLARY
Glucose-Capillary: 56 mg/dL — ABNORMAL LOW (ref 70–99)
Glucose-Capillary: 159 mg/dL — ABNORMAL HIGH (ref 70–99)
Glucose-Capillary: 216 mg/dL — ABNORMAL HIGH (ref 70–99)

## 2019-09-04 MED ORDER — COLCHICINE 0.6 MG PO CAPS: 0.3000 mg | ORAL_CAPSULE | Freq: Every day | ORAL | 2 refills | Status: DC

## 2019-09-04 MED ORDER — INSULIN PEN NEEDLE 32G X 4 MM MISC: 5.0000 [IU]/[oz_av]/d | Freq: Three times a day (TID) | 6 refills | Status: AC

## 2019-09-04 MED ORDER — HEPARIN SOD (PORK) LOCK FLUSH 100 UNIT/ML IV SOLN
500.0000 [IU] | INTRAVENOUS | Status: AC | PRN
  Administered 2019-09-04: 500 [IU]
  Filled 2019-09-04: qty 5

## 2019-09-04 MED ORDER — TORSEMIDE 20 MG PO TABS: 20.0000 mg | ORAL_TABLET | Freq: Every day | ORAL | 6 refills | Status: DC

## 2019-09-04 MED ORDER — DIGOXIN 125 MCG PO TABS: 0.1250 mg | ORAL_TABLET | Freq: Every day | ORAL | 4 refills | Status: DC

## 2019-09-04 MED ORDER — NOVOLOG FLEXPEN 100 UNIT/ML ~~LOC~~ SOPN: 5.0000 [IU] | PEN_INJECTOR | Freq: Three times a day (TID) | SUBCUTANEOUS | 11 refills | Status: AC

## 2019-09-04 MED ORDER — BLOOD GLUCOSE METER KIT: PACK | 0 refills | Status: AC

## 2019-09-04 MED ORDER — COLCHICINE 0.6 MG PO TABS
0.6000 mg | ORAL_TABLET | Freq: Once | ORAL | Status: AC
  Administered 2019-09-04: 0.6 mg via ORAL
  Filled 2019-09-04: qty 1

## 2019-09-04 MED ORDER — INSULIN GLARGINE 100 UNITS/ML SOLOSTAR PEN: 25.0000 [IU] | PEN_INJECTOR | Freq: Every day | SUBCUTANEOUS | 11 refills | Status: DC

## 2019-09-04 MED ORDER — COLCHICINE 0.6 MG PO TABS
0.6000 mg | ORAL_TABLET | ORAL | Status: AC
  Administered 2019-09-04: 0.6 mg via ORAL
  Filled 2019-09-04: qty 1

## 2019-09-04 NOTE — Progress Notes (Addendum)
Inpatient Diabetes Program Recommendations  AACE/ADA: New Consensus Statement on Inpatient Glycemic Control (2015)  Target Ranges:  Prepandial:   less than 140 mg/dL      Peak postprandial:   less than 180 mg/dL (1-2 hours)      Critically ill patients:  140 - 180 mg/dL   Lab Results  Component Value Date   GLUCAP 159 (H) 09/04/2019   HGBA1C 7.8 (H) 08/31/2019    Review of Glycemic Control Results for ZALEAH, TERNES (MRN [id number]) as of 09/04/2019 09:32  Ref. Range 09/03/2019 16:09 09/03/2019 21:25 09/04/2019 06:03 09/04/2019 07:22  Glucose-Capillary Latest Ref Range: 70 - 99 mg/dL 458 (H) 592 (H) 56 (L) 159 (H)   Diabetes history:New onset Outpatient Diabetes medications:none Current orders for Inpatient glycemic control:Novolog 5 units TID, Novolog 0-20 units TID, Lantus 30 units QHS  Inpatient Diabetes Program Recommendations:  Noted mild hypoglycemia this AM of 56 mg/dL.   Consider for discharge:   - Blood glucose meter (includes lancets and strips) (#[id number]) - Novolog 5 units TID per Flexpen (#[id number]) -Lantus 25 units QHS Solostar (#[id number]) - Insulin pen needles (#[id number])  Spoke with patient again to review outpatient diabetes medications, injections, survival skills, and important reminders with carbohydrate mindfulness. Patient has no further questions at this time. Plans to make appointment today.  Thanks, Lujean Rave, MSN, RNC-OB Diabetes Coordinator [phone] (8a-5p)

## 2019-09-04 NOTE — Progress Notes (Signed)
Went over discharge instructions with patient. Patient verbalizes understanding of follow up appointments and medication changes. Port-a-cath has been deaccessed and transportation has been notified. Tele monitor has been removed and CCMD has been notified. Printed prescriptions is in the discharge packet.

## 2019-09-04 NOTE — Discharge Summary (Addendum)
Physician Discharge Summary  Pamela Hudson RCV:[id number] DOB: 12/26/1985 DOA: 08/30/2019  PCP: Curt Jews, PA-C  Admit date: 08/30/2019 Discharge date: 09/04/2019  Admitted From: Home Disposition: Home  Recommendations for Outpatient Follow-up:  1. Follow up with PCP in 1-2 weeks 2. Please obtain CMP/CBC in one week 3. Please follow up with hematologist at [facility] 4. PCP please note that I have stopped her beta-blocker due to soft blood pressure and started her on digoxin as she developed A. fib RVR during hospital stay.  I have also decreased her Demadex to 20 mg daily. 5. I have started her on colchicine 0.3 mg as she had a gout attack on her right great toe 6. Please check digoxin level in 1 week  Home Health: None Equipment/Devices: None  Discharge Condition stable CODE STATUS full code Diet recommendation: Cardiac Brief/Interim Summary: HPI per Dr. Clois Dupes [name] a 34 y.o.femalewith medical history significant forpulmonary hypertension with chronic right-sided heart failure, CKD stage III, sickle cell disease with anemia,andhypertensionwho presented to [place] ED for evaluation of generalized weakness and polydipsia. History is limited from patient due to excessive somnolence therefore majority of history is obtained from EDP and chart review.  Patient reportedly presented to [facility] ED for evaluation of generalized weakness and polydipsia. 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 She has been having polyuria without dysuria. She reported generalized abdominal pain without nausea or vomiting. At time of evaluation upon transfer to [facility] patient is excessively somnolent and unable to provide further history.  [facility] HPED Course: Initial vitals showed BP 113/51, pulse 109, RR 18, temp 97.9 Fahrenheit, SPO2 100% on room air.  Labs are  notable for serum glucose 1185, sodium 124 (150 when corrected for hyperglycemia), potassium 5.8, chloride 86, bicarb 24, BUN 66, creatinine 1.87, AST 50, ALT 92, alk phos 181, total bilirubin 9.5, WBC 29.7, hemoglobin 7.2, hematocrit 20.8, MCV 113, platelets 314,000, lipase 219, serum osmolality 364.  SARS-CoV-2 PCR is negative. Urinalysis shows >500 glucose, negative ketones, negative nitrites, negative leukocytes, 0-5 RBC/hpf, 0-5 WBC/hpf, few bacteria on microscopy. Urine pregnancy test was negative. Mono screen is negative. Group A strep PCR is negative.  Portable chest x-ray shows right Port-A-Cath with tip at the cavoatrial junction, no focal consolidation, edema, or effusion. Patient was given 1 L LR, 1 L NS, and started on insulin infusion. The hospitalist service was consulted to transfer for admission and further management   Discharge Diagnoses:  Active Problems:   Sickle cell anemia (HCC)   Essential hypertension   Hyperkalemia   Transaminitis   Hyperosmolar hyperglycemic state (HHS) (HCC)   Chronic right-sided heart failure (HCC)   Pulmonary hypertension (HCC)   CKD (chronic kidney disease) stage 3, GFR 30-59 ml/min   Diabetes mellitus due to underlying condition with hyperosmolarity without coma, without long-term current use of insulin ([place])   #1 new onset diabetes admitted with hyperglycemia-her blood sugar was over thousand on admission.  She was treated with insulin drip.  She was started on Lantus once her blood sugars were controlled.  She received diabetic teaching and education from diabetic coordinator and nursing staff.  Her hemoglobin A1c was 7.5.  She will be discharged home on Lantus 25 units nightly with NovoLog 5 units 3 times a day.    #2 A. fib RVR -patient was treated with Cardizem drip started on digoxin.  TSH was normal.  She will be discharged home on digoxin.  She needs to follow-up with [facility] cardiologist or with Dr. Virgina Jock with [facility].     #3 history of pulmonary hypertension and chronic right-sided heart failure with ejection fraction 55 to 60% and mild pulmonary hypertension. Patient will follow up with [facility] cardiology as an outpatient. Decrease torsemide to 20 mg daily on discharge.  Her chest x-ray showed pulmonary vascular congestion on admission after IV fluids for hyperosmolar nonketotic state.   #4 CKD stage III stable continue to monitor               #5 history of essential hypertension-blood pressure                        97/56 she will be discharged on Demadex 20 mg daily                instead of 40 mg daily which was dose prior to admission             to hospital.                 #6 history of sickle cell disease on Hydrea                 #7 anemia of chronic disease -she received 2 units  of    packed RBCs.  I discussed with hematologist on call as patient had elevated bilirubin DIC panel was ordered which was negative.  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  She missed follow-up with hematologist at [facility].   #8 elevated LFTs-elevated total bilirubin AST ALT and alkaline phosphatase.    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  RUQ US-s/p chole no focal lesion.  #9 hyperlipidemia LDL 135 triglycerides 96 on statin with elevated LFTs need to monitor very closely.  #10 elevated lipaseresolved she tolerated a diet prior to discharge.    #11 leukocytosis-she has chronic leukocytosis of unclear etiology.  No evidence of infection during his hospital stay.   UA negative for UTI chest x-ray shows pulmonary vascular congestion.afebrile will monitor.  #2 oral thrush nystatin.                               #13 gout she was started on low-dose colchicine due to renal insufficiency follow-up as an outpatient.   Estimated body mass index is 28.74 kg/m as calculated  from the following:   Height as of this encounter: _0  (1.499 m).   Weight as of this encounter: 64.5 kg.  Discharge Instructions  Discharge Instructions    Amb Referral to Nutrition and Diabetic E   Complete by: As directed    Diet - low sodium heart healthy   Complete by: As directed    Increase activity slowly   Complete by: As directed      Allergies as of 09/04/2019      Reactions   Oxycodone-acetaminophen Swelling, Other (See Comments)   Lips swell   Prednisone Other (See Comments)   Elevates BGL      Medication List    STOP taking these medications   metoprolol succinate 25 MG 24 hr tablet Commonly known as: Toprol XL     TAKE these medications   blood glucose meter kit and supplies Dispense  based on patient and insurance preference. Use up to four times daily as directed. (FOR ICD-10 E10.9, E11.9).   Colchicine 0.6 MG Caps Take 0.3 mg by mouth daily.   cyclobenzaprine 10 MG tablet Commonly known as: FLEXERIL Take 10 mg by mouth 3 (three) times daily as needed for muscle spasms.   digoxin 0.125 MG tablet Commonly known as: LANOXIN Take 1 tablet (0.125 mg total) by mouth daily. Start taking on: September 04, 6193   folic acid 1 MG tablet Commonly known as: FOLVITE Take 1 mg by mouth daily.   hydroxyurea 500 MG capsule Commonly known as: HYDREA Take 1,000 mg by mouth daily.   IMPLANON [place] Inject 1 each into the skin once. Implanted spring 2019   insulin glargine 100 unit/mL Sopn Commonly known as: LANTUS Inject 0.25 mLs (25 Units total) into the skin daily.   Insulin Pen Needle 32G X 4 MM Misc 5 Units/oz/day by Does not apply route in the morning, at noon, and at bedtime.   Jadenu 360 MG Tabs Generic drug: Deferasirox Take 1,080 mg by mouth at bedtime.   magnesium oxide 400 MG tablet Commonly known as: MAG-OX Take 800 mg by mouth daily.   NovoLOG FlexPen 100 UNIT/ML FlexPen Generic drug: insulin aspart Inject 5 Units into the skin 3 (three)  times daily with meals.   Oxbryta 500 MG Tabs tablet Generic drug: voxelotor Take 3 tablets by mouth at bedtime.   potassium chloride SA 20 MEQ tablet Commonly known as: KLOR-CON Take 1 tablet (20 mEq total) by mouth daily.   torsemide 20 MG tablet Commonly known as: DEMADEX Take 1 tablet (20 mg total) by mouth daily. What changed:   how much to take  when to take this   Vitamin D (Ergocalciferol) 1.25 MG (50000 UNIT) Caps capsule Commonly known as: DRISDOL Take 100,000 Units by mouth every Sunday.       Follow-up Information    Diamantina Providence H, PA-C Follow up.   Specialty: Physician Assistant Contact information: [address] [place] [address] [phone]              Allergies  Allergen Reactions  . Oxycodone-Acetaminophen Swelling and Other (See Comments)    Lips swell  . Prednisone Other (See Comments)    Elevates BGL    Consultations:  None   Procedures/Studies: DG Chest Portable 1 View  Result Date: 08/30/2019 CLINICAL DATA:  Shortness of breath and cough. Generalized weakness. EXAM: PORTABLE CHEST 1 VIEW COMPARISON:  09/01/2018 FINDINGS: There is a right chest wall port a catheter with tip at the cavoatrial junction. Cardiac enlargement. Pulmonary vascular congestion. No airspace consolidation. No pleural effusion noted. IMPRESSION: Cardiac enlargement and pulmonary vascular congestion. Unchanged from previous exam. Electronically Signed   By: Kerby Moors M.D.   On: 08/30/2019 12:01   DG Foot Complete Left  Result Date: 08/20/2019 CLINICAL DATA:  left foot pain for 1 week EXAM: LEFT FOOT - COMPLETE 3+ VIEW COMPARISON:  None. FINDINGS: There is no evidence of fracture or dislocation. There is no evidence of arthropathy or other focal bone abnormality. Soft tissues are unremarkable. IMPRESSION: Negative. Electronically Signed   By: Ulyses Jarred M.D.   On: 08/20/2019 19:40   US Abdomen Limited RUQ  Result Date:  08/31/2019 CLINICAL DATA:  Right upper quadrant pain EXAM: ULTRASOUND ABDOMEN LIMITED RIGHT UPPER QUADRANT COMPARISON:  None. FINDINGS: Gallbladder: The patient is status post cholecystectomy. Common bile duct: Diameter: 4 mm Liver: No focal lesion identified.  There is mild hepatomegaly. Within normal limits in parenchymal echogenicity. Portal vein is patent on color Doppler imaging with normal direction of blood flow towards the liver. Other: None. IMPRESSION: Mild hepatomegaly.  No focal hepatic lesion. Status post cholecystectomy Electronically Signed   By: Prudencio Pair M.D.   On: 08/31/2019 00:16    (Echo, Carotid, EGD, Colonoscopy, ERCP)    Subjective: Feels well anxious to go home no nausea vomiting tolerating p.o. intake has gout pain in the right great toe and left plantar fasciitis likely  Discharge Exam: Vitals:   09/04/19 0925 09/04/19 1134  BP:  116/74  Pulse: (!) 101 89  Resp:  20  Temp:  98.4 F (36.9 C)  SpO2:  98%   Vitals:   09/04/19 0425 09/04/19 0742 09/04/19 0925 09/04/19 1134  BP: (!) 99/57 109/68  116/74  Pulse: (!) 104 99 (!) 101 89  Resp: _0 Temp: 98.1 F (36.7 C) 98.6 F (37 C)  98.4 F (36.9 C)  TempSrc: Oral Oral  Oral  SpO2: 98% 97%  98%  Weight: 64.5 kg     Height:        General: Pt is alert, awake, not in acute distress Cardiovascular: RRR, S1/S2 +, no rubs, no gallops Respiratory: CTA bilaterally, no wheezing, no rhonchi Abdominal: Soft, NT, ND, bowel sounds + Extremities no edema, left plantar fascia tender, right great toe tender with decreased range of motion   The results of significant diagnostics from this hospitalization (including imaging, microbiology, ancillary and laboratory) are listed below for reference.     Microbiology: Recent Results (from the past 240 hour(s))  Group A Strep by PCR     Status: None   Collection Time: 08/30/19 11:34 AM   Specimen: Throat; Sterile Swab  Result Value Ref Range Status   Group A  Strep by PCR NOT DETECTED NOT DETECTED Final    Comment: Performed at [facility], [address] [place], [place] [address]  SARS Coronavirus 2 by RT PCR (hospital order, performed in [facility] hospital lab) Nasopharyngeal Nasopharyngeal Swab     Status: None   Collection Time: 08/30/19  1:34 PM   Specimen: Nasopharyngeal Swab  Result Value Ref Range Status   SARS Coronavirus 2 NEGATIVE NEGATIVE Final    Comment: (NOTE) SARS-CoV-2 target nucleic acids are NOT DETECTED.  The SARS-CoV-2 RNA is generally detectable in upper and lower respiratory specimens during the acute phase of infection. The lowest concentration of SARS-CoV-2 viral copies this assay can detect is 250 copies / mL. A negative result does not preclude SARS-CoV-2 infection and should not be used as the sole basis for treatment or other patient management decisions.  A negative result may occur with improper specimen collection / handling, submission of specimen other than nasopharyngeal swab, presence of viral mutation(s) within the areas targeted by this assay, and inadequate number of viral copies (<250 copies / mL). A negative result must be combined with clinical observations, patient history, and epidemiological information.  Fact Sheet for Patients:   StrictlyIdeas.no  Fact Sheet for Healthcare Providers: BankingDealers.co.za  This test is not yet approved or  cleared by the [place] FDA and has been authorized for detection and/or diagnosis of SARS-CoV-2 by FDA under an Emergency Use Authorization (EUA).  This EUA will remain in effect (meaning this test can be used) for the duration of the COVID-19 declaration under Section 564(b)(1) of the Act, 21 U.S.C. section 360bbb-3(b)(1), unless the  authorization is terminated or revoked sooner.  Performed at [facility], [address] [place], [place] [address]      Labs: BNP (last  3 results) No results for input(s): BNP in the last 8760 hours. Basic Metabolic Panel: Recent Labs  Lab 08/31/19 1048 08/31/19 1352 09/02/19 0702 09/03/19 1407 09/04/19 0537  NA 140 134* 133* 134* 134*  K 4.2 4.4 4.3 5.0 4.9  CL 104 101 100 101 103  CO2 _0 GLUCOSE 228* 417* 187* 173* 61*  BUN 53* 54* 35* 25* 28*  CREATININE 1.61* 1.67* 1.40* 1.31* 1.47*  CALCIUM 8.9 8.7* 8.9 9.0 8.7*   Liver Function Tests: Recent Labs  Lab 08/30/19 1134 08/31/19 0527 09/02/19 0702 09/03/19 1407 09/04/19 0537  AST 50* 51* 77* 76* 65*  ALT 92* 78* 79* 75* 63*  ALKPHOS 181* 136* 116 147* 134*  BILITOT 9.5* 7.5* 11.5* 12.4* 11.6*  PROT 9.7* 9.2* 8.4* 9.2* 8.5*  ALBUMIN 3.4* 3.0* 2.8* 2.9* 2.6*   Recent Labs  Lab 08/30/19 1134  LIPASE 219*   No results for input(s): AMMONIA in the last 168 hours. CBC: Recent Labs  Lab 08/30/19 1134 08/30/19 1134 08/31/19 0527 09/02/19 0702 09/02/19 1841 09/03/19 0630 09/04/19 0537  WBC 29.7*  --  32.0* 26.9*  --  24.7* 27.7*  NEUTROABS 25.2*  --   --   --   --  17.8*  --   HGB 7.2*  --  7.1* 6.6*  --  7.2* 8.6*  HCT 20.8*  --  19.5* 18.3*  --  20.2* 24.7*  MCV 113.0*  --  106.6* 107.0*  --  103.1* 100.4*  PLT 314   < > 268 226 199 166 172   < > = values in this interval not displayed.   Cardiac Enzymes: No results for input(s): CKTOTAL, CKMB, CKMBINDEX, TROPONINI in the last 168 hours. BNP: Invalid input(s): POCBNP CBG: Recent Labs  Lab 09/03/19 1609 09/03/19 2125 09/04/19 0603 09/04/19 0722 09/04/19 1132  GLUCAP 161* 104* 56* 159* 216*   D-Dimer Recent Labs    09/02/19 1841  DDIMER 2.64*   Hgb A1c No results for input(s): HGBA1C in the last 72 hours. Lipid Profile No results for input(s): CHOL, HDL, LDLCALC, TRIG, CHOLHDL, LDLDIRECT in the last 72 hours. Thyroid function studies Recent Labs    09/03/19 0630  TSH 3.651   Anemia work up No results for input(s): VITAMINB12, FOLATE, FERRITIN, TIBC, IRON,  RETICCTPCT in the last 72 hours. Urinalysis    Component Value Date/Time   COLORURINE YELLOW 08/30/2019 1117   APPEARANCEUR CLEAR 08/30/2019 1117   LABSPEC <1.005 (L) 08/30/2019 1117   PHURINE 5.5 08/30/2019 1117   GLUCOSEU >=500 (A) 08/30/2019 1117   HGBUR NEGATIVE 08/30/2019 1117   [place] 08/30/2019 1117   KETONESUR NEGATIVE 08/30/2019 1117   PROTEINUR NEGATIVE 08/30/2019 1117   UROBILINOGEN 0.2 05/15/2013 2130   NITRITE NEGATIVE 08/30/2019 1117   LEUKOCYTESUR NEGATIVE 08/30/2019 1117   Sepsis Labs Invalid input(s): PROCALCITONIN,  WBC,  LACTICIDVEN Microbiology Recent Results (from the past 240 hour(s))  Group A Strep by PCR     Status: None   Collection Time: 08/30/19 11:34 AM   Specimen: Throat; Sterile Swab  Result Value Ref Range Status   Group A Strep by PCR NOT DETECTED NOT DETECTED Final    Comment: Performed at [facility], [address] [place], [place] [address]  SARS Coronavirus 2 by RT  PCR (hospital order, performed in [facility] hospital lab) Nasopharyngeal Nasopharyngeal Swab     Status: None   Collection Time: 08/30/19  1:34 PM   Specimen: Nasopharyngeal Swab  Result Value Ref Range Status   SARS Coronavirus 2 NEGATIVE NEGATIVE Final    Comment: (NOTE) SARS-CoV-2 target nucleic acids are NOT DETECTED.  The SARS-CoV-2 RNA is generally detectable in upper and lower respiratory specimens during the acute phase of infection. The lowest concentration of SARS-CoV-2 viral copies this assay can detect is 250 copies / mL. A negative result does not preclude SARS-CoV-2 infection and should not be used as the sole basis for treatment or other patient management decisions.  A negative result may occur with improper specimen collection / handling, submission of specimen other than nasopharyngeal swab, presence of viral mutation(s) within the areas targeted by this assay, and inadequate number of viral copies (<250 copies / mL). A negative  result must be combined with clinical observations, patient history, and epidemiological information.  Fact Sheet for Patients:   StrictlyIdeas.no  Fact Sheet for Healthcare Providers: BankingDealers.co.za  This test is not yet approved or  cleared by the [place] FDA and has been authorized for detection and/or diagnosis of SARS-CoV-2 by FDA under an Emergency Use Authorization (EUA).  This EUA will remain in effect (meaning this test can be used) for the duration of the COVID-19 declaration under Section 564(b)(1) of the Act, 21 U.S.C. section 360bbb-3(b)(1), unless the authorization is terminated or revoked sooner.  Performed at [facility], [address], [place], [place] [address]      Time coordinating discharge:  39 minutes  SIGNED:   Georgette Shell, MD  [facility] 09/04/2019, 12:36 PM Pager   If 7PM-7AM, please contact night-coverage [email or web address] Password [email or web address]

## 2019-09-04 NOTE — Plan of Care (Signed)
  Problem: Activity: Goal: Risk for activity intolerance will decrease Outcome: Progressing   Problem: Coping: Goal: Level of anxiety will decrease Outcome: Progressing   Problem: Pain Managment: Goal: General experience of comfort will improve Outcome: Progressing   Problem: Safety: Goal: Ability to remain free from injury will improve Outcome: Progressing   

## 2019-09-04 NOTE — Progress Notes (Addendum)
Nutrition Education Note   RD consulted for nutrition education regarding diabetes.    Lab Results  Component Value Date   HGBA1C 7.8 (H) 08/31/2019   PTA DM medications are none.   Labs reviewed: CBGS: 56-159(inpatient orders for glycemic control are Novolog 5 units TID, Novolog 0-20 units TID, Lantus 30 units QHS).   Spoke with pt at bedside, who is pleasant and in good spirits today. She reports good appetite and is "adjusting to counting carbs and eating differently". Pt was able to teach back basic diabetes self-management skills to this RD. Pt reports she will have the most difficulty with portions sizes, but intends to purchase measuring cups to help her with this.   Majority of visit was spent discussing ways to incorporate favorite foods in diet, portion control, healthier cooking methods, and low calorie beverage options. Pt was very thoughtful in her questions and responses and feels empowered to control her DM. She shares with this RD how her grandmother with DM was able to control her blood sugars without insulin after "she really started watching her diet".   RD provided "Carbohydrate Counting for People with Diabetes" handout from the Academy of Nutrition and Dietetics. Discussed different food groups and their effects on blood sugar, emphasizing carbohydrate-containing foods. Provided list of carbohydrates and recommended serving sizes of common foods.  Discussed importance of controlled and consistent carbohydrate intake throughout the day. Provided examples of ways to balance meals/snacks and encouraged intake of high-fiber, whole grain complex carbohydrates. Teach back method used.  Expect fair to good compliance.  Current diet order is carb modified, patient is consuming approximately 50-100% of meals at this time. Labs and medications reviewed. No further nutrition interventions warranted at this time. RD contact information provided. If additional nutrition issues arise,  please re-consult RD.  Levada Schilling, RD, LDN, CDCES Registered Dietitian II Certified Diabetes Care and Education Specialist Please refer to [facility] for RD and/or RD on-call/weekend/after hours pager

## 2019-09-04 NOTE — TOC Transition Note (Signed)
Transition of Care [facility]) - CM/SW Discharge Note   Patient Details  Name: Pamela Hudson MRN: [id number] Date of Birth: [birth year]  Transition of Care [facility]) CM/SW Contact:  Leone Haven, RN Phone Number: 09/04/2019, 1:13 PM   Clinical Narrative:    Patient lives with 32 yo son, and her boyfriend.  She has no issues with getting medications, food etc.  She has transportation at dc.  She gets medications from [facility] on [address] in [place].  TOC will continue to follow for dc needs.    Final next level of care: Home/Self Care Barriers to Discharge: No Barriers Identified   Patient Goals and CMS Choice Patient states their goals for this hospitalization and ongoing recovery are:: to manage diabetes, count carbs   Choice offered to / list presented to : NA  Discharge Placement                       Discharge Plan and Services In-house Referral: NA Discharge Planning Services: CM Consult Post Acute Care Choice: NA            DME Agency: NA       HH Arranged: NA          Social Determinants of Health (SDOH) Interventions Physical Activity Interventions:  (walks tracks for30 mins)   Readmission Risk Interventions Readmission Risk Prevention Plan 09/04/2019  Transportation Screening Complete  PCP or Specialist Appt within 3-5 Days Complete  HRI or Home Care Consult Complete  Social Work Consult for Recovery Care Planning/Counseling Complete  Palliative Care Screening Not Applicable  Medication Review [employer]) Complete  Some recent data might be hidden

## 2019-09-04 NOTE — Progress Notes (Signed)
Home Medication is still in pharmacy. Called patient and notified. Patient verbalizes that she "did not want them, she has a lot at home". Patient will not returning back for them.

## 2019-09-04 NOTE — Progress Notes (Signed)
CBG 56. Pt. Alert and stable, no distress noted.  Carbs given. RN will monitor.

## 2019-09-04 NOTE — TOC Initial Note (Signed)
Transition of Care [facility]) - Initial/Assessment Note    Patient Details  Name: Pamela Hudson MRN: [id number] Date of Birth: [birth year]  Transition of Care [facility]) CM/SW Contact:    Zenon Mayo, RN Phone Number: 09/04/2019, 1:09 PM  Clinical Narrative:                 Patient lives with 32 yo son, and her boyfriend.  She has no issues with getting medications, food etc.  She has transportation at dc.  She gets medications from [facility] on [address] in [place].  TOC will continue to follow for dc needs.  Expected Discharge Plan: Home/Self Care Barriers to Discharge: Continued Medical Work up   Patient Goals and CMS Choice Patient states their goals for this hospitalization and ongoing recovery are:: to manage diabetes, count carbs   Choice offered to / list presented to : NA  Expected Discharge Plan and Services Expected Discharge Plan: Home/Self Care In-house Referral: NA Discharge Planning Services: CM Consult Post Acute Care Choice: NA Living arrangements for the past 2 months: Apartment Expected Discharge Date: 09/04/19                 DME Agency: NA       HH Arranged: NA          Prior Living Arrangements/Services Living arrangements for the past 2 months: Apartment Lives with:: Minor Children, Significant Other Patient language and need for interpreter reviewed:: Yes Do you feel safe going back to the place where you live?: Yes      Need for Family Participation in Patient Care: Yes (Comment) Care giver support system in place?: Yes (comment)   Criminal Activity/Legal Involvement Pertinent to Current Situation/Hospitalization: No - Comment as needed  Activities of Daily Living Home Assistive Devices/Equipment: Eyeglasses ADL Screening (condition at time of admission) Patient's cognitive ability adequate to safely complete daily activities?: Yes Is the patient deaf or have difficulty hearing?: No Does the patient have  difficulty seeing, even when wearing glasses/contacts?: No Does the patient have difficulty concentrating, remembering, or making decisions?: No Patient able to express need for assistance with ADLs?: Yes Does the patient have difficulty dressing or bathing?: No Independently performs ADLs?: Yes (appropriate for developmental age) Does the patient have difficulty walking or climbing stairs?: Yes Weakness of Legs: Left Weakness of Arms/Hands: None  Permission Sought/Granted                  Emotional Assessment Appearance:: Appears stated age Attitude/Demeanor/Rapport: Engaged Affect (typically observed): Appropriate Orientation: : Oriented to Self, Oriented to Place, Oriented to  Time, Oriented to Situation Alcohol / Substance Use: Not Applicable    Admission diagnosis:  Hyperosmolar hyperglycemic state (HHS) ([place]) [E11.00, E11.65] Patient Active Problem List   Diagnosis Date Noted  . Diabetes mellitus due to underlying condition with hyperosmolarity without coma, without long-term current use of insulin ([place])   . Hyperosmolar hyperglycemic state (HHS) ([place]) 08/30/2019  . Chronic right-sided heart failure ([place])   . Pulmonary hypertension ([place])   . CKD (chronic kidney disease) stage 3, GFR 30-59 ml/min   . Other ascites   . Transaminitis 08/31/2018  . Acute on chronic right-sided heart failure ([place]) 08/30/2018  . Acute exacerbation of CHF (congestive heart failure) ([place]) 07/09/2018  . Anemia in other chronic diseases classified elsewhere 07/09/2018  . Hyperkalemia 03/25/2018  . Acute respiratory failure with hypoxia ([place]) 02/17/2018  . Essential hypertension 02/17/2018  . Anemia 02/17/2018  . Dyspnea 02/16/2018  .  Gastroenteritis 05/28/2017  . Dehydration 05/28/2017  . AKI (acute kidney injury) (HCC) 05/28/2017  . Sickle cell anemia with crisis (HCC) 05/28/2017  . Hemorrhagic ovarian cyst 12/26/2016  . Hemorrhagic cyst of ovary 12/26/2016  . Pneumonia 11/26/2015  .  Sickle cell anemia (HCC) 11/26/2015  . Elevated bilirubin 11/26/2015  . Serum calcium elevated 11/26/2015  . Hyponatremia 11/26/2015  . Leukocytosis 11/26/2015  . CVA (cerebral infarction) 02/13/2012   PCP:  Judd Lien, PA-C Pharmacy:   [id number] - [place], [place] - [address] [place] [address] Phone: [phone] Fax: [phone]  [id number] - [place], [place] - [address] [place] [address] Phone: [phone] Fax: [phone]     Social Determinants of Health (SDOH) Interventions    Readmission Risk Interventions Readmission Risk Prevention Plan 09/04/2019  Transportation Screening Complete  PCP or Specialist Appt within 3-5 Days Complete  HRI or Home Care Consult Complete  Social Work Consult for Recovery Care Planning/Counseling Complete  Palliative Care Screening Not Applicable  Medication Review Oceanographer) Complete  Some recent data might be hidden

## 2019-10-09 ENCOUNTER — Ambulatory Visit: Payer: Medicaid Other | Admitting: Skilled Nursing Facility1

## 2019-11-15 ENCOUNTER — Ambulatory Visit: Payer: Medicaid Other | Admitting: Registered"

## 2019-12-14 ENCOUNTER — Emergency Department (HOSPITAL_BASED_OUTPATIENT_CLINIC_OR_DEPARTMENT_OTHER)
Admission: EM | Admit: 2019-12-14 | Discharge: 2019-12-14 | Discharge status: Against Medical Advice | Payer: Medicaid Other | Attending: Emergency Medicine | Admitting: Emergency Medicine

## 2019-12-14 ENCOUNTER — Other Ambulatory Visit: Payer: Self-pay

## 2019-12-14 ENCOUNTER — Encounter (HOSPITAL_BASED_OUTPATIENT_CLINIC_OR_DEPARTMENT_OTHER): Payer: Self-pay | Admitting: Emergency Medicine

## 2019-12-14 DIAGNOSIS — M79602 Pain in left arm: Secondary | ICD-10-CM | POA: Diagnosis not present

## 2019-12-14 DIAGNOSIS — Z5321 Procedure and treatment not carried out due to patient leaving prior to being seen by health care provider: Secondary | ICD-10-CM | POA: Insufficient documentation

## 2019-12-14 NOTE — ED Triage Notes (Signed)
Reports going into sickle cell crisis on Saturday.  Took her normal meds at home with relief of back pain.  Now having pain in left arm.  Not sure if related to crisis or not.  No relief with pain meds.

## 2020-05-27 ENCOUNTER — Inpatient Hospital Stay (HOSPITAL_BASED_OUTPATIENT_CLINIC_OR_DEPARTMENT_OTHER)
Admission: EM | Admit: 2020-05-27 | Discharge: 2020-06-11 | DRG: 286 | Disposition: A | Discharge status: Home / Self Care | Payer: Medicaid Other | Attending: Internal Medicine | Admitting: Internal Medicine

## 2020-05-27 ENCOUNTER — Encounter (HOSPITAL_COMMUNITY): Payer: Self-pay | Admitting: Internal Medicine

## 2020-05-27 ENCOUNTER — Other Ambulatory Visit: Payer: Self-pay

## 2020-05-27 ENCOUNTER — Emergency Department (HOSPITAL_BASED_OUTPATIENT_CLINIC_OR_DEPARTMENT_OTHER): Payer: Medicaid Other

## 2020-05-27 DIAGNOSIS — I5082 Biventricular heart failure: Principal | ICD-10-CM | POA: Diagnosis present

## 2020-05-27 DIAGNOSIS — G8929 Other chronic pain: Secondary | ICD-10-CM | POA: Diagnosis present

## 2020-05-27 DIAGNOSIS — Z87891 Personal history of nicotine dependence: Secondary | ICD-10-CM

## 2020-05-27 DIAGNOSIS — R509 Fever, unspecified: Secondary | ICD-10-CM | POA: Diagnosis not present

## 2020-05-27 DIAGNOSIS — E1122 Type 2 diabetes mellitus with diabetic chronic kidney disease: Secondary | ICD-10-CM | POA: Diagnosis present

## 2020-05-27 DIAGNOSIS — I2781 Cor pulmonale (chronic): Secondary | ICD-10-CM | POA: Diagnosis present

## 2020-05-27 DIAGNOSIS — K759 Inflammatory liver disease, unspecified: Secondary | ICD-10-CM | POA: Diagnosis present

## 2020-05-27 DIAGNOSIS — T80211A Bloodstream infection due to central venous catheter, initial encounter: Secondary | ICD-10-CM | POA: Diagnosis not present

## 2020-05-27 DIAGNOSIS — M109 Gout, unspecified: Secondary | ICD-10-CM | POA: Diagnosis present

## 2020-05-27 DIAGNOSIS — Z20822 Contact with and (suspected) exposure to covid-19: Secondary | ICD-10-CM | POA: Diagnosis present

## 2020-05-27 DIAGNOSIS — N182 Chronic kidney disease, stage 2 (mild): Secondary | ICD-10-CM

## 2020-05-27 DIAGNOSIS — I509 Heart failure, unspecified: Secondary | ICD-10-CM

## 2020-05-27 DIAGNOSIS — I50813 Acute on chronic right heart failure: Secondary | ICD-10-CM | POA: Diagnosis not present

## 2020-05-27 DIAGNOSIS — Z9049 Acquired absence of other specified parts of digestive tract: Secondary | ICD-10-CM

## 2020-05-27 DIAGNOSIS — R17 Unspecified jaundice: Secondary | ICD-10-CM | POA: Diagnosis not present

## 2020-05-27 DIAGNOSIS — Z8616 Personal history of COVID-19: Secondary | ICD-10-CM

## 2020-05-27 DIAGNOSIS — G4733 Obstructive sleep apnea (adult) (pediatric): Secondary | ICD-10-CM | POA: Diagnosis present

## 2020-05-27 DIAGNOSIS — E860 Dehydration: Secondary | ICD-10-CM | POA: Diagnosis present

## 2020-05-27 DIAGNOSIS — I130 Hypertensive heart and chronic kidney disease with heart failure and stage 1 through stage 4 chronic kidney disease, or unspecified chronic kidney disease: Secondary | ICD-10-CM | POA: Diagnosis present

## 2020-05-27 DIAGNOSIS — R7989 Other specified abnormal findings of blood chemistry: Secondary | ICD-10-CM | POA: Diagnosis present

## 2020-05-27 DIAGNOSIS — E8770 Fluid overload, unspecified: Secondary | ICD-10-CM | POA: Diagnosis not present

## 2020-05-27 DIAGNOSIS — Z794 Long term (current) use of insulin: Secondary | ICD-10-CM

## 2020-05-27 DIAGNOSIS — I471 Supraventricular tachycardia: Secondary | ICD-10-CM

## 2020-05-27 DIAGNOSIS — Z833 Family history of diabetes mellitus: Secondary | ICD-10-CM

## 2020-05-27 DIAGNOSIS — Z8673 Personal history of transient ischemic attack (TIA), and cerebral infarction without residual deficits: Secondary | ICD-10-CM

## 2020-05-27 DIAGNOSIS — N189 Chronic kidney disease, unspecified: Secondary | ICD-10-CM | POA: Diagnosis not present

## 2020-05-27 DIAGNOSIS — E282 Polycystic ovarian syndrome: Secondary | ICD-10-CM | POA: Diagnosis present

## 2020-05-27 DIAGNOSIS — I313 Pericardial effusion (noninflammatory): Secondary | ICD-10-CM | POA: Diagnosis not present

## 2020-05-27 DIAGNOSIS — E119 Type 2 diabetes mellitus without complications: Secondary | ICD-10-CM

## 2020-05-27 DIAGNOSIS — R0602 Shortness of breath: Secondary | ICD-10-CM | POA: Diagnosis present

## 2020-05-27 DIAGNOSIS — M79673 Pain in unspecified foot: Secondary | ICD-10-CM

## 2020-05-27 DIAGNOSIS — Z87442 Personal history of urinary calculi: Secondary | ICD-10-CM

## 2020-05-27 DIAGNOSIS — R0600 Dyspnea, unspecified: Secondary | ICD-10-CM | POA: Diagnosis not present

## 2020-05-27 DIAGNOSIS — Z79899 Other long term (current) drug therapy: Secondary | ICD-10-CM

## 2020-05-27 DIAGNOSIS — Y838 Other surgical procedures as the cause of abnormal reaction of the patient, or of later complication, without mention of misadventure at the time of the procedure: Secondary | ICD-10-CM | POA: Diagnosis not present

## 2020-05-27 DIAGNOSIS — N179 Acute kidney failure, unspecified: Secondary | ICD-10-CM | POA: Diagnosis present

## 2020-05-27 DIAGNOSIS — D72825 Bandemia: Secondary | ICD-10-CM | POA: Diagnosis not present

## 2020-05-27 DIAGNOSIS — K761 Chronic passive congestion of liver: Secondary | ICD-10-CM | POA: Diagnosis present

## 2020-05-27 DIAGNOSIS — R7881 Bacteremia: Secondary | ICD-10-CM

## 2020-05-27 DIAGNOSIS — R000 Tachycardia, unspecified: Secondary | ICD-10-CM | POA: Diagnosis not present

## 2020-05-27 DIAGNOSIS — D638 Anemia in other chronic diseases classified elsewhere: Secondary | ICD-10-CM

## 2020-05-27 DIAGNOSIS — I272 Pulmonary hypertension, unspecified: Secondary | ICD-10-CM | POA: Diagnosis not present

## 2020-05-27 DIAGNOSIS — I5043 Acute on chronic combined systolic (congestive) and diastolic (congestive) heart failure: Secondary | ICD-10-CM | POA: Diagnosis present

## 2020-05-27 DIAGNOSIS — M79651 Pain in right thigh: Secondary | ICD-10-CM

## 2020-05-27 DIAGNOSIS — K469 Unspecified abdominal hernia without obstruction or gangrene: Secondary | ICD-10-CM | POA: Diagnosis present

## 2020-05-27 DIAGNOSIS — I50812 Chronic right heart failure: Secondary | ICD-10-CM | POA: Diagnosis not present

## 2020-05-27 DIAGNOSIS — E11649 Type 2 diabetes mellitus with hypoglycemia without coma: Secondary | ICD-10-CM | POA: Diagnosis not present

## 2020-05-27 DIAGNOSIS — A411 Sepsis due to other specified staphylococcus: Secondary | ICD-10-CM | POA: Diagnosis not present

## 2020-05-27 DIAGNOSIS — I4891 Unspecified atrial fibrillation: Secondary | ICD-10-CM | POA: Diagnosis present

## 2020-05-27 DIAGNOSIS — M7730 Calcaneal spur, unspecified foot: Secondary | ICD-10-CM | POA: Diagnosis present

## 2020-05-27 DIAGNOSIS — I50810 Right heart failure, unspecified: Secondary | ICD-10-CM | POA: Diagnosis not present

## 2020-05-27 DIAGNOSIS — N183 Chronic kidney disease, stage 3 unspecified: Secondary | ICD-10-CM

## 2020-05-27 DIAGNOSIS — A419 Sepsis, unspecified organism: Secondary | ICD-10-CM | POA: Diagnosis not present

## 2020-05-27 DIAGNOSIS — D72829 Elevated white blood cell count, unspecified: Secondary | ICD-10-CM | POA: Diagnosis present

## 2020-05-27 DIAGNOSIS — R0601 Orthopnea: Secondary | ICD-10-CM | POA: Diagnosis not present

## 2020-05-27 DIAGNOSIS — F1021 Alcohol dependence, in remission: Secondary | ICD-10-CM | POA: Diagnosis not present

## 2020-05-27 DIAGNOSIS — N1832 Chronic kidney disease, stage 3b: Secondary | ICD-10-CM | POA: Diagnosis present

## 2020-05-27 DIAGNOSIS — I517 Cardiomegaly: Secondary | ICD-10-CM | POA: Diagnosis not present

## 2020-05-27 DIAGNOSIS — I361 Nonrheumatic tricuspid (valve) insufficiency: Secondary | ICD-10-CM | POA: Diagnosis not present

## 2020-05-27 DIAGNOSIS — B957 Other staphylococcus as the cause of diseases classified elsewhere: Secondary | ICD-10-CM | POA: Diagnosis not present

## 2020-05-27 DIAGNOSIS — D571 Sickle-cell disease without crisis: Secondary | ICD-10-CM | POA: Diagnosis present

## 2020-05-27 DIAGNOSIS — D649 Anemia, unspecified: Secondary | ICD-10-CM | POA: Diagnosis not present

## 2020-05-27 DIAGNOSIS — I081 Rheumatic disorders of both mitral and tricuspid valves: Secondary | ICD-10-CM | POA: Diagnosis present

## 2020-05-27 DIAGNOSIS — I340 Nonrheumatic mitral (valve) insufficiency: Secondary | ICD-10-CM | POA: Diagnosis not present

## 2020-05-27 DIAGNOSIS — E871 Hypo-osmolality and hyponatremia: Secondary | ICD-10-CM | POA: Diagnosis present

## 2020-05-27 DIAGNOSIS — Z7984 Long term (current) use of oral hypoglycemic drugs: Secondary | ICD-10-CM | POA: Diagnosis not present

## 2020-05-27 DIAGNOSIS — Z8249 Family history of ischemic heart disease and other diseases of the circulatory system: Secondary | ICD-10-CM

## 2020-05-27 DIAGNOSIS — R7401 Elevation of levels of liver transaminase levels: Secondary | ICD-10-CM | POA: Diagnosis not present

## 2020-05-27 DIAGNOSIS — D5780 Other sickle-cell disorders without crisis: Secondary | ICD-10-CM | POA: Diagnosis not present

## 2020-05-27 DIAGNOSIS — I2721 Secondary pulmonary arterial hypertension: Secondary | ICD-10-CM | POA: Diagnosis present

## 2020-05-27 DIAGNOSIS — R748 Abnormal levels of other serum enzymes: Secondary | ICD-10-CM | POA: Diagnosis not present

## 2020-05-27 DIAGNOSIS — K760 Fatty (change of) liver, not elsewhere classified: Secondary | ICD-10-CM | POA: Diagnosis present

## 2020-05-27 DIAGNOSIS — E875 Hyperkalemia: Secondary | ICD-10-CM | POA: Diagnosis present

## 2020-05-27 DIAGNOSIS — Z793 Long term (current) use of hormonal contraceptives: Secondary | ICD-10-CM

## 2020-05-27 DIAGNOSIS — D5700 Hb-SS disease with crisis, unspecified: Secondary | ICD-10-CM | POA: Diagnosis not present

## 2020-05-27 HISTORY — DX: Chronic kidney disease, stage 3 unspecified: N18.30

## 2020-05-27 HISTORY — DX: COVID-19: U07.1

## 2020-05-27 HISTORY — DX: Sleep apnea, unspecified: G47.30

## 2020-05-27 HISTORY — DX: Type 2 diabetes mellitus without complications: E11.9

## 2020-05-27 HISTORY — DX: Unspecified atrial fibrillation: I48.91

## 2020-05-27 LAB — URINALYSIS, ROUTINE W REFLEX MICROSCOPIC
Bilirubin Urine: NEGATIVE
Glucose, UA: >=500 mg/dL — AB
Hgb urine dipstick: NEGATIVE
Ketones, ur: NEGATIVE mg/dL
Leukocytes,Ua: NEGATIVE
Nitrite: NEGATIVE
Protein, ur: NEGATIVE mg/dL
Specific Gravity, Urine: 1.01 (ref 1.005–1.030)
pH: 5.5 (ref 5.0–8.0)

## 2020-05-27 LAB — CBC WITH DIFFERENTIAL/PLATELET
Abs Immature Granulocytes: 0.14 10*3/uL — ABNORMAL HIGH (ref 0.00–0.07)
Basophils Absolute: 0.1 10*3/uL (ref 0.0–0.1)
Basophils Relative: 1 %
Eosinophils Absolute: 0 10*3/uL (ref 0.0–0.5)
Eosinophils Relative: 0 %
HCT: 20.9 % — ABNORMAL LOW (ref 36.0–46.0)
Hemoglobin: 7.8 g/dL — ABNORMAL LOW (ref 12.0–15.0)
Immature Granulocytes: 1 %
Lymphocytes Relative: 22 %
Lymphs Abs: 2.8 10*3/uL (ref 0.7–4.0)
MCH: 45.6 pg — ABNORMAL HIGH (ref 26.0–34.0)
MCHC: 37.3 g/dL — ABNORMAL HIGH (ref 30.0–36.0)
MCV: 122.2 fL — ABNORMAL HIGH (ref 80.0–100.0)
Monocytes Absolute: 2 10*3/uL — ABNORMAL HIGH (ref 0.1–1.0)
Monocytes Relative: 15 %
Neutro Abs: 7.9 10*3/uL — ABNORMAL HIGH (ref 1.7–7.7)
Neutrophils Relative %: 61 %
Platelets: 222 10*3/uL (ref 150–400)
RBC: 1.71 MIL/uL — ABNORMAL LOW (ref 3.87–5.11)
RDW: 17.4 % — ABNORMAL HIGH (ref 11.5–15.5)
Smear Review: NORMAL
WBC: 12.8 10*3/uL — ABNORMAL HIGH (ref 4.0–10.5)
nRBC: 6.4 % — ABNORMAL HIGH (ref 0.0–0.2)

## 2020-05-27 LAB — CBG MONITORING, ED: Glucose-Capillary: 182 mg/dL — ABNORMAL HIGH (ref 70–99)

## 2020-05-27 LAB — COMPREHENSIVE METABOLIC PANEL
ALT: 88 U/L — ABNORMAL HIGH (ref 0–44)
AST: 98 U/L — ABNORMAL HIGH (ref 15–41)
Albumin: 3.2 g/dL — ABNORMAL LOW (ref 3.5–5.0)
Alkaline Phosphatase: 118 U/L (ref 38–126)
Anion gap: 10 (ref 5–15)
BUN: 55 mg/dL — ABNORMAL HIGH (ref 6–20)
CO2: 15 mmol/L — ABNORMAL LOW (ref 22–32)
Calcium: 8.5 mg/dL — ABNORMAL LOW (ref 8.9–10.3)
Chloride: 107 mmol/L (ref 98–111)
Creatinine, Ser: 1.49 mg/dL — ABNORMAL HIGH (ref 0.44–1.00)
GFR, Estimated: 48 mL/min — ABNORMAL LOW (ref >60)
Glucose, Bld: 202 mg/dL — ABNORMAL HIGH (ref 70–99)
Potassium: 5.1 mmol/L (ref 3.5–5.1)
Sodium: 132 mmol/L — ABNORMAL LOW (ref 135–145)
Total Bilirubin: 5 mg/dL — ABNORMAL HIGH (ref 0.3–1.2)
Total Protein: 9.4 g/dL — ABNORMAL HIGH (ref 6.5–8.1)

## 2020-05-27 LAB — TROPONIN I (HIGH SENSITIVITY)
Troponin I (High Sensitivity): 34 ng/L — ABNORMAL HIGH (ref <18)
Troponin I (High Sensitivity): 34 ng/L — ABNORMAL HIGH (ref <18)

## 2020-05-27 LAB — RESP PANEL BY RT-PCR (FLU A&B, COVID) ARPGX2
Influenza A by PCR: NEGATIVE
Influenza B by PCR: NEGATIVE
SARS Coronavirus 2 by RT PCR: NEGATIVE

## 2020-05-27 LAB — URINALYSIS, MICROSCOPIC (REFLEX): WBC, UA: NONE SEEN WBC/hpf (ref 0–5)

## 2020-05-27 LAB — DIGOXIN LEVEL: Digoxin Level: <0.2 ng/mL — ABNORMAL LOW (ref 0.8–2.0)

## 2020-05-27 LAB — RETICULOCYTES
Immature Retic Fract: 33.5 % — ABNORMAL HIGH (ref 2.3–15.9)
RBC.: 1.68 MIL/uL — ABNORMAL LOW (ref 3.87–5.11)
Retic Count, Absolute: 128.4 10*3/uL (ref 19.0–186.0)
Retic Ct Pct: 7.6 % — ABNORMAL HIGH (ref 0.4–3.1)

## 2020-05-27 LAB — PREGNANCY, URINE: Preg Test, Ur: NEGATIVE

## 2020-05-27 LAB — LIPASE, BLOOD: Lipase: 30 U/L (ref 11–51)

## 2020-05-27 LAB — BRAIN NATRIURETIC PEPTIDE: B Natriuretic Peptide: 1228.5 pg/mL — ABNORMAL HIGH (ref 0.0–100.0)

## 2020-05-27 MED ORDER — ENOXAPARIN SODIUM 40 MG/0.4ML ~~LOC~~ SOLN
40.0000 mg | Freq: Every day | SUBCUTANEOUS | Status: DC
  Administered 2020-05-28: 40 mg via SUBCUTANEOUS
  Filled 2020-05-27 (×2): qty 0.4

## 2020-05-27 MED ORDER — DIGOXIN 125 MCG PO TABS: 0.1250 mg | ORAL_TABLET | Freq: Every day | ORAL | Status: DC

## 2020-05-27 MED ORDER — FUROSEMIDE 10 MG/ML IJ SOLN
40.0000 mg | Freq: Two times a day (BID) | INTRAMUSCULAR | Status: DC
  Administered 2020-05-28: 40 mg via INTRAVENOUS
  Filled 2020-05-27: qty 4

## 2020-05-27 MED ORDER — INSULIN ASPART 100 UNIT/ML ~~LOC~~ SOLN
0.0000 [IU] | SUBCUTANEOUS | Status: DC
  Administered 2020-05-28 (×2): 3 [IU] via SUBCUTANEOUS
  Administered 2020-05-28: 1 [IU] via SUBCUTANEOUS
  Administered 2020-05-28: 3 [IU] via SUBCUTANEOUS
  Administered 2020-05-29 (×3): 1 [IU] via SUBCUTANEOUS
  Administered 2020-05-30: 2 [IU] via SUBCUTANEOUS
  Administered 2020-05-30: 7 [IU] via SUBCUTANEOUS
  Administered 2020-05-30: 1 [IU] via SUBCUTANEOUS
  Administered 2020-05-30: 3 [IU] via SUBCUTANEOUS
  Administered 2020-05-31: 2 [IU] via SUBCUTANEOUS
  Administered 2020-05-31: 3 [IU] via SUBCUTANEOUS
  Administered 2020-05-31: 7 [IU] via SUBCUTANEOUS
  Administered 2020-05-31: 5 [IU] via SUBCUTANEOUS
  Administered 2020-05-31: 2 [IU] via SUBCUTANEOUS
  Administered 2020-05-31: 3 [IU] via SUBCUTANEOUS
  Administered 2020-06-01: 5 [IU] via SUBCUTANEOUS
  Administered 2020-06-01: 1 [IU] via SUBCUTANEOUS
  Administered 2020-06-01: 3 [IU] via SUBCUTANEOUS
  Administered 2020-06-01: 2 [IU] via SUBCUTANEOUS
  Administered 2020-06-02: 1 [IU] via SUBCUTANEOUS
  Administered 2020-06-02: 3 [IU] via SUBCUTANEOUS
  Administered 2020-06-02: 2 [IU] via SUBCUTANEOUS
  Administered 2020-06-02: 5 [IU] via SUBCUTANEOUS
  Administered 2020-06-02: 2 [IU] via SUBCUTANEOUS
  Administered 2020-06-03: 5 [IU] via SUBCUTANEOUS
  Administered 2020-06-03: 2 [IU] via SUBCUTANEOUS
  Administered 2020-06-03: 7 [IU] via SUBCUTANEOUS
  Administered 2020-06-03: 9 [IU] via SUBCUTANEOUS
  Administered 2020-06-03: 2 [IU] via SUBCUTANEOUS
  Administered 2020-06-03: 3 [IU] via SUBCUTANEOUS
  Administered 2020-06-04: 7 [IU] via SUBCUTANEOUS
  Administered 2020-06-04: 2 [IU] via SUBCUTANEOUS
  Administered 2020-06-04: 3 [IU] via SUBCUTANEOUS

## 2020-05-27 MED ORDER — COLCHICINE 0.3 MG HALF TABLET: 0.3000 mg | ORAL_TABLET | Freq: Every day | ORAL | Status: DC

## 2020-05-27 MED ORDER — ONDANSETRON HCL 4 MG/2ML IJ SOLN
4.0000 mg | Freq: Once | INTRAMUSCULAR | Status: AC
  Administered 2020-05-27: 4 mg via INTRAVENOUS
  Filled 2020-05-27: qty 2

## 2020-05-27 MED ORDER — INSULIN GLARGINE 100 UNIT/ML ~~LOC~~ SOLN
30.0000 [IU] | Freq: Every day | SUBCUTANEOUS | Status: DC
  Administered 2020-05-28: 30 [IU] via SUBCUTANEOUS
  Filled 2020-05-27 (×2): qty 0.3

## 2020-05-27 MED ORDER — SODIUM CHLORIDE 0.9% FLUSH: 3.0000 mL | INTRAVENOUS | Status: DC | PRN

## 2020-05-27 MED ORDER — SODIUM CHLORIDE 0.9 % IV SOLN: 250.0000 mL | INTRAVENOUS | Status: DC | PRN

## 2020-05-27 MED ORDER — FUROSEMIDE 10 MG/ML IJ SOLN
60.0000 mg | Freq: Once | INTRAMUSCULAR | Status: AC
  Administered 2020-05-27: 60 mg via INTRAVENOUS
  Filled 2020-05-27: qty 6

## 2020-05-27 MED ORDER — SODIUM CHLORIDE 0.9% FLUSH
3.0000 mL | Freq: Two times a day (BID) | INTRAVENOUS | Status: DC
  Administered 2020-05-27 – 2020-05-29 (×3): 3 mL via INTRAVENOUS

## 2020-05-27 MED ORDER — HYDROXYUREA 500 MG PO CAPS
1000.0000 mg | ORAL_CAPSULE | Freq: Every day | ORAL | Status: DC
  Administered 2020-05-28 – 2020-06-08 (×11): 1000 mg via ORAL
  Administered 2020-06-09 (×2): 500 mg via ORAL
  Administered 2020-06-10 – 2020-06-11 (×2): 1000 mg via ORAL
  Filled 2020-05-27 (×17): qty 2

## 2020-05-27 MED ORDER — DEFERASIROX 360 MG PO TABS: 1080.0000 mg | ORAL_TABLET | Freq: Every day | ORAL | Status: DC

## 2020-05-27 NOTE — ED Notes (Signed)
Placed on cont cardiac monitoring with ECG done, int NBP assessments and cont POX monitoring as well.

## 2020-05-27 NOTE — ED Triage Notes (Signed)
Presents today, states she has hx of heart failure, last week began feeling weak, having shortness of breath, has 5lb wt gain in the past 3 days.

## 2020-05-27 NOTE — ED Notes (Signed)
Green top redrawn and taken to the lab.

## 2020-05-27 NOTE — ED Provider Notes (Signed)
[facility] EMERGENCY DEPARTMENT Provider Note   CSN: [id number] Arrival date & time: 05/27/20  1532     History Chief Complaint  Patient presents with  . Weakness    Pamela Hudson is a 35 y.o. female.  The history is provided by the patient.  Shortness of Breath Severity:  Mild Onset quality:  Gradual Timing:  Constant Progression:  Worsening Chronicity:  Recurrent Context: activity (leg swelling and thinks fluid overloaded. Denies Chest pain or abdominal pain. Takes fluid pill.)   Relieved by:  Nothing Worsened by:  Exertion (lying flat) Associated symptoms: no abdominal pain, no chest pain, no claudication, no cough, no diaphoresis, no ear pain, no fever, no headaches, no hemoptysis, no neck pain, no PND, no rash, no sore throat, no sputum production, no syncope, no swollen glands, no vomiting and no wheezing   Risk factors: no hx of PE/DVT   Risk factors comment:  CHF/PHTN/CKD      Past Medical History:  Diagnosis Date  . Cardiomegaly   . CHF (congestive heart failure) ([place])   . Chronic pain   . CVA (cerebral infarction) 02/2002  . Hypertension   . Liver disease    ?iron  . PCOS (polycystic ovarian syndrome)   . Renal stone   . Sickle cell anemia [facility])     Patient Active Problem List   Diagnosis Date Noted  . Diabetes mellitus due to underlying condition with hyperosmolarity without coma, without long-term current use of insulin ([place])   . Hyperosmolar hyperglycemic state (HHS) ([place]) 08/30/2019  . Chronic right-sided heart failure ([place])   . Pulmonary hypertension ([place])   . CKD (chronic kidney disease) stage 3, GFR 30-59 ml/min (HCC)   . Other ascites   . Transaminitis 08/31/2018  . Acute on chronic right-sided heart failure ([place]) 08/30/2018  . Acute exacerbation of CHF (congestive heart failure) ([place]) 07/09/2018  . Anemia in other chronic diseases classified elsewhere 07/09/2018  . Hyperkalemia 03/25/2018  . Acute respiratory failure with  hypoxia ([place]) 02/17/2018  . Essential hypertension 02/17/2018  . Anemia 02/17/2018  . Dyspnea 02/16/2018  . Gastroenteritis 05/28/2017  . Dehydration 05/28/2017  . AKI (acute kidney injury) ([place]) 05/28/2017  . Sickle cell anemia with crisis ([place]) 05/28/2017  . Hemorrhagic ovarian cyst 12/26/2016  . Hemorrhagic cyst of ovary 12/26/2016  . Pneumonia 11/26/2015  . Sickle cell anemia ([place]) 11/26/2015  . Elevated bilirubin 11/26/2015  . Serum calcium elevated 11/26/2015  . Hyponatremia 11/26/2015  . Leukocytosis 11/26/2015  . CVA (cerebral infarction) 02/13/2012    Past Surgical History:  Procedure Laterality Date  . CHOLECYSTECTOMY    . LIVER BIOPSY  2007  . PARATHYROIDECTOMY    . PORTACATH PLACEMENT    . RIGHT HEART CATH N/A 09/01/2018   Procedure: RIGHT HEART CATH;  Surgeon: Adrian Prows, MD;  Location: [facility];  Service: Cardiovascular;  Laterality: N/A;     OB History   No obstetric history on file.     Family History  Problem Relation Age of Onset  . Diabetes Mellitus II Father   . CAD Father   . Diabetes Mellitus II Paternal Grandmother     Social History   Tobacco Use  . Smoking status: Former Research scientist (life sciences)  . Smokeless tobacco: Never Used  Vaping Use  . Vaping Use: Never used  Substance Use Topics  . Alcohol use: Yes    Comment: occ  . Drug use: No    Home Medications Prior to Admission medications  Medication Sig Start Date End Date Taking? Authorizing Provider  Colchicine 0.6 MG CAPS Take 0.3 mg by mouth daily. 09/04/19  Yes Georgette Shell, MD  Deferasirox 360 MG TABS Take 1,080 mg by mouth at bedtime.    Yes [provider]  empagliflozin (JARDIANCE) 25 MG TABS tablet Take 25 mg by mouth daily.   Yes [provider]  folic acid (FOLVITE) 1 MG tablet Take 1 mg by mouth daily. 11/21/15  Yes [provider]  hydroxyurea (HYDREA) 500 MG capsule Take 1,000 mg by mouth daily.    Yes [provider]  insulin aspart  (NOVOLOG FLEXPEN) 100 UNIT/ML FlexPen Inject 5 Units into the skin 3 (three) times daily with meals. Patient taking differently: Inject 5 Units into the skin 3 (three) times daily with meals. Sliding scale 09/04/19  Yes Georgette Shell, MD  insulin glargine (LANTUS) 100 unit/mL SOPN Inject 0.25 mLs (25 Units total) into the skin daily. Patient taking differently: Inject 30 Units into the skin at bedtime. 09/04/19  Yes Georgette Shell, MD  Insulin Pen Needle 32G X 4 MM MISC 5 Units/oz/day by Does not apply route in the morning, at noon, and at bedtime. 09/04/19  Yes Georgette Shell, MD  magnesium oxide (MAG-OX) 400 MG tablet Take 800 mg by mouth daily.  01/26/18  Yes [provider]  metFORMIN (GLUCOPHAGE) 500 MG tablet Take 1,000 mg by mouth 2 (two) times daily with a meal.   Yes [provider]  OXBRYTA 500 MG TABS tablet Take 3 tablets by mouth at bedtime.  08/28/19  Yes [provider]  torsemide (DEMADEX) 20 MG tablet Take 1 tablet (20 mg total) by mouth daily. 09/04/19  Yes Georgette Shell, MD  Vitamin D, Ergocalciferol, (DRISDOL) 1.25 MG (50000 UT) CAPS capsule Take 100,000 Units by mouth every Sunday.  01/26/18  Yes [provider]  blood glucose meter kit and supplies Dispense based on patient and insurance preference. Use up to four times daily as directed. (FOR ICD-10 E10.9, E11.9). 09/04/19   Georgette Shell, MD  cyclobenzaprine (FLEXERIL) 10 MG tablet Take 10 mg by mouth 3 (three) times daily as needed for muscle spasms.    [provider]  digoxin (LANOXIN) 0.125 MG tablet Take 1 tablet (0.125 mg total) by mouth daily. 09/05/19   Georgette Shell, MD  Etonogestrel [facility]) Inject 1 each into the skin once. Implanted spring 2019    [provider]  potassium chloride SA (K-DUR) 20 MEQ tablet Take 1 tablet (20 mEq total) by mouth daily. 09/11/18   Darrick Grinder D, NP    Allergies    Oxycodone-acetaminophen and  Prednisone  Review of Systems   Review of Systems  Constitutional: Negative for chills, diaphoresis and fever.  HENT: Negative for ear pain and sore throat.   Eyes: Negative for pain and visual disturbance.  Respiratory: Positive for shortness of breath. Negative for cough, hemoptysis, sputum production and wheezing.   Cardiovascular: Negative for chest pain, palpitations, claudication, syncope and PND.  Gastrointestinal: Negative for abdominal pain and vomiting.  Genitourinary: Negative for dysuria and hematuria.  Musculoskeletal: Negative for arthralgias, back pain and neck pain.  Skin: Negative for color change and rash.  Neurological: Negative for seizures, syncope and headaches.  All other systems reviewed and are negative.   Physical Exam Updated Vital Signs  ED Triage Vitals  Enc Vitals Group     BP 05/27/20 1545 114/70     Pulse  Rate 05/27/20 1545 (!) 124     Resp 05/27/20 1545 (!) 28     Temp 05/27/20 1545 98.5 F (36.9 C)     Temp Source 05/27/20 1545 Oral     SpO2 05/27/20 1545 100 %     Weight 05/27/20 1546 140 lb 9.6 oz (63.8 kg)     Height 05/27/20 1546 '4\' 10"'  (1.473 m)     Head Circumference --      Peak Flow --      Pain Score 05/27/20 1546 0     Pain Loc --      Pain Edu? --      Excl. in [place]? --      Physical Exam Vitals and nursing note reviewed.  Constitutional:      General: She is not in acute distress.    Appearance: She is well-developed.  HENT:     Head: Normocephalic and atraumatic.     Comments: JVD     Mouth/Throat:     Mouth: Mucous membranes are moist.  Eyes:     Extraocular Movements: Extraocular movements intact.     Conjunctiva/sclera: Conjunctivae normal.     Pupils: Pupils are equal, round, and reactive to light.  Cardiovascular:     Rate and Rhythm: Regular rhythm. Tachycardia present.     Heart sounds: No murmur heard.   Pulmonary:     Effort: Pulmonary effort is normal. No respiratory distress.     Breath sounds: Rales  present.  Abdominal:     Palpations: Abdomen is soft.     Tenderness: There is no abdominal tenderness.  Musculoskeletal:     Cervical back: Neck supple.     Right lower leg: Edema (2+) present.     Left lower leg: Edema (2+) present.  Skin:    General: Skin is warm and dry.     Capillary Refill: Capillary refill takes less than 2 seconds.  Neurological:     General: No focal deficit present.     Mental Status: She is alert.     ED Results / Procedures / Treatments   Labs (all labs ordered are listed, but only abnormal results are displayed) Labs Reviewed  CBC WITH DIFFERENTIAL/PLATELET - Abnormal; Notable for the following components:      Result Value   WBC 12.8 (*)    RBC 1.71 (*)    Hemoglobin 7.8 (*)    HCT 20.9 (*)    MCV 122.2 (*)    MCH 45.6 (*)    MCHC 37.3 (*)    RDW 17.4 (*)    nRBC 6.4 (*)    Neutro Abs 7.9 (*)    Monocytes Absolute 2.0 (*)    Abs Immature Granulocytes 0.14 (*)    All other components within normal limits  DIGOXIN LEVEL - Abnormal; Notable for the following components:   Digoxin Level <0.2 (*)    All other components within normal limits  COMPREHENSIVE METABOLIC PANEL - Abnormal; Notable for the following components:   Sodium 132 (*)    CO2 15 (*)    Glucose, Bld 202 (*)    BUN 55 (*)    Creatinine, Ser 1.49 (*)    Calcium 8.5 (*)    Total Protein 9.4 (*)    Albumin 3.2 (*)    AST 98 (*)    ALT 88 (*)    Total Bilirubin 5.0 (*)    GFR, Estimated 48 (*)    All other components within normal limits  BRAIN NATRIURETIC PEPTIDE - Abnormal; Notable for the following components:   B Natriuretic Peptide 1,228.5 (*)    All other components within normal limits  CBG MONITORING, ED - Abnormal; Notable for the following components:   Glucose-Capillary 182 (*)    All other components within normal limits  TROPONIN I (HIGH SENSITIVITY) - Abnormal; Notable for the following components:   Troponin I (High Sensitivity) 34 (*)    All other  components within normal limits  RESP PANEL BY RT-PCR (FLU A&B, COVID) ARPGX2  LIPASE, BLOOD  RETICULOCYTES  URINALYSIS, ROUTINE W REFLEX MICROSCOPIC  PREGNANCY, URINE  BETA-HYDROXYBUTYRIC ACID  TROPONIN I (HIGH SENSITIVITY)    EKG EKG Interpretation  Date/Time:  Tuesday May 27 2020 15:58:16 EDT Ventricular Rate:  123 PR Interval:    QRS Duration: 103 QT Interval:  339 QTC Calculation: 485 R Axis:   36 Text Interpretation: Sinus tachycardia Ventricular premature complex Prolonged PR interval Probable left atrial enlargement Low voltage, extremity leads Borderline prolonged QT interval Confirmed by Lennice Sites [phone]) on 05/27/2020 4:03:05 PM   Radiology DG Chest Portable 1 View  Result Date: 05/27/2020 CLINICAL DATA:  Weakness and shortness of breath. EXAM: PORTABLE CHEST 1 VIEW COMPARISON:  08/30/2019 FINDINGS: Right chest wall port a catheter is noted with tip in the SVC. Cardiac enlargement, unchanged. New right pleural effusion with overlying subsegmental atelectasis. Pulmonary vascular congestion. The visualized osseous structures are unremarkable. IMPRESSION: 1. Cardiac enlargement, pulmonary vascular congestion, and new right pleural effusion with overlying subsegmental atelectasis. Findings concerning for CHF. Electronically Signed   By: Kerby Moors M.D.   On: 05/27/2020 17:04    Procedures Procedures   Medications Ordered in ED Medications  furosemide (LASIX) injection 60 mg (60 mg Intravenous Given 05/27/20 1850)    ED Course  I have reviewed the triage vital signs and the nursing notes.  Pertinent labs & imaging results that were available during my care of the patient were reviewed by me and considered in my medical decision making (see chart for details).    MDM Rules/Calculators/A&P                          Khrystal Jeanmarie is a 34 year old female with history of sickle cell, chronic pain, congestive heart failure, pulmonary hypertension, elevated liver  enzymes who presents the ED with shortness of breath, leg swelling.  Patient tachycardic to 120s but otherwise unremarkable vitals.  EKG shows a sinus tachycardia.  No obvious ischemic changes.  Patient appears volume overloaded with 2+ pitting edema bilaterally in her legs.  She has JVD.  She has had progressive shortness of breath and leg swelling over the last several days to weeks.  Has been compliant with her medicine.  Has orthopnea.  Has rales on exam.  Have a low suspicion for ACS.  Low suspicion for PE.  Overall suspect volume overload likely in the setting of chronic kidney disease and pulmonary hypertension heart failure.  Denies any abdominal pain.  Creatinine appears to be at baseline.  Bilirubin is improved from prior to 5.  Liver enzymes still mildly elevated.  Troponin is 34 and BNP is 1200.  X-ray consistent with heart failure/volume overload.  She has a bicarb of 15.  However blood sugars 200 and anion gap is normal.  No concern for DKA.  Overall suspect multifactorial volume overload likely in the setting of pulmonary hypertension, CKD.  We will give her dose of IV Lasix and have her  admitted for aggressive diuresis.  This chart was dictated using voice recognition software.  Despite best efforts to proofread,  errors can occur which can change the documentation meaning.    Final Clinical Impression(s) / ED Diagnoses Final diagnoses:  Acute on chronic congestive heart failure, unspecified heart failure type (HCC)  Hypervolemia, unspecified hypervolemia type  SOB (shortness of breath)    Rx / DC Orders ED Discharge Orders    None       Lennice Sites, DO 05/27/20 1857

## 2020-05-27 NOTE — H&P (Signed)
Pamela Hudson FQH:[id number] DOB: 10-22-1985 DOA: 05/27/2020     PCP: Curt Jews, PA-C   Outpatient Specialists:   CARDS:  [facility]:  [facility] Dr. Audie Clear   Hematology At [facility] Patient arrived to ER on 05/27/20 at 1532 Referred by Attending Marcelyn Bruins, MD   Patient coming from: home Lives   With family    Chief Complaint:  Chief Complaint  Patient presents with  . Weakness    HPI: Pamela Hudson is a 35 y.o. female with medical history significant of cardiomegaly, CHF, chronic pain, CVA, HTN, PCOS, sickle cell anemia, DM2, chronic right heart strain, pulmonary Hypertension, CKD stage 3, HTN, transient A.fib COVID 2020    Presented with   Gradual leg swelling patient with CHF, chronic pain.  Patient was concerned that she was volume overloaded.  She has been having also worsening shortness of breath for the past few days to weeks.  States she has been taking her medication Reports up to 5 pound weight gain over the past 3 days   Infectious risk factors:  Reports shortness of breath,    Has been vaccinated against COVID    Initial COVID TEST  NEGATIVE   Lab Results  Component Value Date   [place] 05/27/2020   [place] NEGATIVE 08/30/2019   [place] NEGATIVE 08/31/2018     Regarding pertinent Chronic problems:      HTN on Metoprolol   chronic CHF right heart - last echo 2020 EF ejection 60%  fraction of 60-65%. Torsemide    DM 2 -  Lab Results  Component Value Date   HGBA1C 7.8 (H) 08/31/2019   on insulin lantus   OSA - noncompliant with CPAP   Sickle Cell - Hydroxyuria, folic acid  A. Fib -  - CHA2DS2 vas score <2      Not on anticoagulation      CKD stage IIIb- baseline Cr  1.5 Estimated Creatinine Clearance: 43.9 mL/min (A) (by C-G formula based on SCr of 1.49 mg/dL (H)).  Lab Results  Component Value Date   CREATININE 1.49 (H) 05/27/2020   CREATININE 1.47 (H)  09/04/2019   CREATININE 1.31 (H) 09/03/2019      Chronic anemia - baseline hg Hemoglobin & Hematocrit  Recent Labs    09/03/19 0630 09/04/19 0537 05/27/20 1604  HGB 7.2* 8.6* 7.8*    While in ER: Initially presented to [facility] elevated BNP 1228 Troponin 34 Was given a dose of Lasix and transferred to [facility]    ED Triage Vitals  Enc Vitals Group     BP 05/27/20 1545 114/70     Pulse Rate 05/27/20 1545 (!) 124     Resp 05/27/20 1545 (!) 28     Temp 05/27/20 1545 98.5 F (36.9 C)     Temp Source 05/27/20 1545 Oral     SpO2 05/27/20 1545 100 %     Weight 05/27/20 1546 140 lb 9.6 oz (63.8 kg)     Height 05/27/20 1546 '4\' 10"'  (1.473 m)     Head Circumference --      Peak Flow --      Pain Score 05/27/20 1546 0     Pain Loc --      Pain Edu? --      Excl. in [place]? --   TMAX(24)@     _________________________________________ Significant initial  Findings: Abnormal Labs Reviewed  CBC WITH DIFFERENTIAL/PLATELET -  Abnormal; Notable for the following components:      Result Value   WBC 12.8 (*)    RBC 1.71 (*)    Hemoglobin 7.8 (*)    HCT 20.9 (*)    MCV 122.2 (*)    MCH 45.6 (*)    MCHC 37.3 (*)    RDW 17.4 (*)    nRBC 6.4 (*)    Neutro Abs 7.9 (*)    Monocytes Absolute 2.0 (*)    Abs Immature Granulocytes 0.14 (*)    All other components within normal limits  URINALYSIS, ROUTINE W REFLEX MICROSCOPIC - Abnormal; Notable for the following components:   Glucose, UA >=500 (*)    All other components within normal limits  DIGOXIN LEVEL - Abnormal; Notable for the following components:   Digoxin Level <0.2 (*)    All other components within normal limits  COMPREHENSIVE METABOLIC PANEL - Abnormal; Notable for the following components:   Sodium 132 (*)    CO2 15 (*)    Glucose, Bld 202 (*)    BUN 55 (*)    Creatinine, Ser 1.49 (*)    Calcium 8.5 (*)    Total Protein 9.4 (*)    Albumin 3.2 (*)    AST 98 (*)    ALT 88 (*)    Total Bilirubin 5.0  (*)    GFR, Estimated 48 (*)    All other components within normal limits  BRAIN NATRIURETIC PEPTIDE - Abnormal; Notable for the following components:   B Natriuretic Peptide 1,228.5 (*)    All other components within normal limits  URINALYSIS, MICROSCOPIC (REFLEX) - Abnormal; Notable for the following components:   Bacteria, UA RARE (*)    All other components within normal limits  RETICULOCYTES - Abnormal; Notable for the following components:   Retic Ct Pct 7.6 (*)    RBC. 1.68 (*)    Immature Retic Fract 33.5 (*)    All other components within normal limits  CBG MONITORING, ED - Abnormal; Notable for the following components:   Glucose-Capillary 182 (*)    All other components within normal limits  TROPONIN I (HIGH SENSITIVITY) - Abnormal; Notable for the following components:   Troponin I (High Sensitivity) 34 (*)    All other components within normal limits  TROPONIN I (HIGH SENSITIVITY) - Abnormal; Notable for the following components:   Troponin I (High Sensitivity) 34 (*)    All other components within normal limits   ____________________________________________ Ordered   CXR -Findings concerning for CHF.    _________________________ Troponin 34 ECG: Ordered Personally reviewed by me showing: HR : 123 Rhythm:   Sinus tachycardia Probable left atrial enlargement    no evidence of ischemic changes QTC 485 ______  The recent clinical data is shown below. Vitals:   05/27/20 1718 05/27/20 1830 05/27/20 2022 05/27/20 2135  BP: 110/74 105/77 109/79 101/75  Pulse: (!) 123 (!) 126 (!) 124 (!) 123  Resp: 19 16 (!) 22 20  Temp:    99.1 F (37.3 C)  TempSrc:    Oral  SpO2: 96% 100% 94% 98%  Weight:    63.5 kg  Height:    '4\' 11"'  (1.499 m)   WBC     Component Value Date/Time   WBC 12.8 (H) 05/27/2020 1604   LYMPHSABS 2.8 05/27/2020 1604   MONOABS 2.0 (H) 05/27/2020 1604   EOSABS 0.0 05/27/2020 1604   BASOSABS 0.1 05/27/2020 1604     UA   no evidence of  UTI       Urine analysis:    Component Value Date/Time   COLORURINE YELLOW 05/27/2020 1610   APPEARANCEUR CLEAR 05/27/2020 1610   LABSPEC 1.010 05/27/2020 1610   PHURINE 5.5 05/27/2020 1610   GLUCOSEU >=500 (A) 05/27/2020 1610   HGBUR NEGATIVE 05/27/2020 1610   BILIRUBINUR NEGATIVE 05/27/2020 1610   KETONESUR NEGATIVE 05/27/2020 1610   PROTEINUR NEGATIVE 05/27/2020 1610   UROBILINOGEN 0.2 05/15/2013 2130   NITRITE NEGATIVE 05/27/2020 1610   LEUKOCYTESUR NEGATIVE 05/27/2020 1610    Results for orders placed or performed during the hospital encounter of 05/27/20  Resp Panel by RT-PCR (Flu A&B, Covid) Nasopharyngeal Swab     Status: None   Collection Time: 05/27/20  4:26 PM   Specimen: Nasopharyngeal Swab; Nasopharyngeal(NP) swabs in vial transport medium  Result Value Ref Range Status   SARS Coronavirus 2 by RT PCR NEGATIVE NEGATIVE Final         Influenza A by PCR NEGATIVE NEGATIVE Final   Influenza B by PCR NEGATIVE NEGATIVE Final          _______________ Hospitalist was called for admission for CHF exacerbation  The following Work up has been ordered so far:  Orders Placed This Encounter  Procedures  . Resp Panel by RT-PCR (Flu A&B, Covid) Nasopharyngeal Swab  . DG Chest Portable 1 View  . CBC with Differential  . Urinalysis, Routine w reflex microscopic  . Pregnancy, urine  . Digoxin level  . Comprehensive metabolic panel  . Lipase, blood  . Beta-hydroxybutyric acid  . Brain natriuretic peptide  . Urinalysis, Microscopic (reflex)  . Reticulocytes  . Cardiac monitoring  . Consult to hospitalist  . Airborne and Contact precautions  . POC CBG, ED  . EKG 12-Lead  . ED EKG  . EKG 12-Lead  . Repeat EKG  . EKG 12-Lead  . Insert peripheral IV  . Admit to Inpatient (patient's expected length of stay will be greater than 2 midnights or inpatient only procedure)    Following Medications were ordered in ER: Medications  furosemide (LASIX) injection 60 mg (60 mg Intravenous  Given 05/27/20 1850)  ondansetron (ZOFRAN) injection 4 mg (4 mg Intravenous Given 05/27/20 2048)        Consult Orders  (From admission, onward)         Start     Ordered   05/27/20 1841  Consult to hospitalist  Spoke with Marcello Moores at [facility] regarding hospitalist consult at 06:40  Once       Provider:  (Not yet assigned)  Question Answer Comment  Place call to: [facility] long for HF exacerbation   Reason for Consult Admit      05/27/20 1840            OTHER Significant initial  Findings:  labs showing:    Recent Labs  Lab 05/27/20 1700  NA 132*  K 5.1  CO2 15*  GLUCOSE 202*  BUN 55*  CREATININE 1.49*  CALCIUM 8.5*    Cr   Stable,  Lab Results  Component Value Date   CREATININE 1.49 (H) 05/27/2020   CREATININE 1.47 (H) 09/04/2019   CREATININE 1.31 (H) 09/03/2019    Recent Labs  Lab 05/27/20 1700  AST 98*  ALT 88*  ALKPHOS 118  BILITOT 5.0*  PROT 9.4*  ALBUMIN 3.2*   Lab Results  Component Value Date   CALCIUM 8.5 (L) 05/27/2020   PHOS 4.9 (H) 08/31/2018      Plt:  Lab Results  Component Value Date   PLT 222 05/27/2020      Recent Labs  Lab 05/27/20 1604  WBC 12.8*  NEUTROABS 7.9*  HGB 7.8*  HCT 20.9*  MCV 122.2*  PLT 222    HG/HCT   Down   from baseline see below    Component Value Date/Time   HGB 7.8 (L) 05/27/2020 1604   HCT 20.9 (L) 05/27/2020 1604   MCV 122.2 (H) 05/27/2020 1604     Recent Labs  Lab 05/27/20 1700  LIPASE 30     Cardiac Panel (last 3 results) No results for input(s): CKTOTAL, CKMB, TROPONINI, RELINDX in the last 72 hours.   BNP (last 3 results) Recent Labs    05/27/20 1733  BNP 1,228.5*    DM  labs:  HbA1C: Recent Labs    08/30/19 2041 08/31/19 1352  HGBA1C 7.5* 7.8*       CBG (last 3)  Recent Labs    05/27/20 1602  GLUCAP 182*       Cultures:    Component Value Date/Time   SDES  02/16/2018 1920    BLOOD ARTERIAL Performed at [facility], [address], [place], [place] [address]    [facility]  02/16/2018 1920    BOTTLES DRAWN AEROBIC AND ANAEROBIC Blood Culture adequate volume Performed at [facility], [address], [place], [place] [address]    CULT  02/16/2018 1920    NO GROWTH 5 DAYS Performed at [facility], [place] [address] [place], [place] [address]    REPTSTATUS 02/21/2018 FINAL 02/16/2018 1920     Radiological Exams on Admission: DG Chest Portable 1 View  Result Date: 05/27/2020 CLINICAL DATA:  Weakness and shortness of breath. EXAM: PORTABLE CHEST 1 VIEW COMPARISON:  08/30/2019 FINDINGS: Right chest wall port a catheter is noted with tip in the SVC. Cardiac enlargement, unchanged. New right pleural effusion with overlying subsegmental atelectasis. Pulmonary vascular congestion. The visualized osseous structures are unremarkable. IMPRESSION: 1. Cardiac enlargement, pulmonary vascular congestion, and new right pleural effusion with overlying subsegmental atelectasis. Findings concerning for CHF. Electronically Signed   By: Kerby Moors M.D.   On: 05/27/2020 17:04   _______________________________________________________________________________________________________ Latest  Blood pressure 101/75, pulse (!) 123, temperature 99.1 F (37.3 C), temperature source Oral, resp. rate 20, height '4\' 11"'  (1.499 m), weight 63.5 kg, SpO2 98 %.   Review of Systems:    Pertinent positives include:   shortness of breath at rest.  Bilateral lower extremity swelling  dyspnea on exertion,  Constitutional:  No weight loss, night sweats, Fevers, chills, fatigue, weight loss  HEENT:  No headaches, Difficulty swallowing,Tooth/dental problems,Sore throat,  No sneezing, itching, ear ache, nasal congestion, post nasal drip,  Cardio-vascular:  No chest pain, Orthopnea, PND, anasarca, dizziness, palpitations.no GI:  No heartburn, indigestion, abdominal pain, nausea, vomiting, diarrhea, change in bowel habits, loss of  appetite, melena, blood in stool, hematemesis Resp:   No excess mucus, no productive cough, No non-productive cough, No coughing up of blood.No change in color of mucus.No wheezing. Skin:  no rash or lesions. No jaundice GU:  no dysuria, change in color of urine, no urgency or frequency. No straining to urinate.  No flank pain.  Musculoskeletal:  No joint pain or no joint swelling. No decreased range of motion. No back pain.  Psych:  No change in mood or affect. No depression or anxiety. No memory loss.  Neuro: no localizing neurological complaints, no  tingling, no weakness, no double vision, no gait abnormality, no slurred speech, no confusion  All systems reviewed and apart from Lucky all are negative _______________________________________________________________________________________________ Past Medical History:   Past Medical History:  Diagnosis Date  . Cardiomegaly   . CHF (congestive heart failure) ([place])   . Chronic pain   . CVA (cerebral infarction) 02/2002  . Hypertension   . Liver disease    ?iron  . PCOS (polycystic ovarian syndrome)   . Renal stone   . Sickle cell anemia (HCC)        Past Surgical History:  Procedure Laterality Date  . CHOLECYSTECTOMY    . LIVER BIOPSY  2007  . PARATHYROIDECTOMY    . PORTACATH PLACEMENT    . RIGHT HEART CATH N/A 09/01/2018   Procedure: RIGHT HEART CATH;  Surgeon: Adrian Prows, MD;  Location: [facility];  Service: Cardiovascular;  Laterality: N/A;    Social History:  Ambulatory  Independently     reports that she has quit smoking. She has never used smokeless tobacco. She reports current alcohol use. She reports that she does not use drugs.     Family History:   Family History  Problem Relation Age of Onset  . Diabetes Mellitus II Father   . CAD Father   . Diabetes Mellitus II Paternal Grandmother     ______________________________________________________________________________________________ Allergies: Allergies  Allergen Reactions  . Oxycodone-Acetaminophen Swelling and Other (See Comments)    Lips swell  . Prednisone Other (See Comments)    Elevates BGL     Prior to Admission medications   Medication Sig Start Date End Date Taking? Authorizing Provider  Colchicine 0.6 MG CAPS Take 0.3 mg by mouth daily. 09/04/19  Yes Georgette Shell, MD  Deferasirox 360 MG TABS Take 1,080 mg by mouth at bedtime.    Yes [provider]  empagliflozin (JARDIANCE) 25 MG TABS tablet Take 25 mg by mouth daily.   Yes [provider]  folic acid (FOLVITE) 1 MG tablet Take 1 mg by mouth daily. 11/21/15  Yes [provider]  hydroxyurea (HYDREA) 500 MG capsule Take 1,000 mg by mouth daily.    Yes [provider]  insulin aspart (NOVOLOG FLEXPEN) 100 UNIT/ML FlexPen Inject 5 Units into the skin 3 (three) times daily with meals. Patient taking differently: Inject 5 Units into the skin 3 (three) times daily with meals. Sliding scale 09/04/19  Yes Georgette Shell, MD  insulin glargine (LANTUS) 100 unit/mL SOPN Inject 0.25 mLs (25 Units total) into the skin daily. Patient taking differently: Inject 30 Units into the skin at bedtime. 09/04/19  Yes Georgette Shell, MD  Insulin Pen Needle 32G X 4 MM MISC 5 Units/oz/day by Does not apply route in the morning, at noon, and at bedtime. 09/04/19  Yes Georgette Shell, MD  magnesium oxide (MAG-OX) 400 MG tablet Take 800 mg by mouth daily.  01/26/18  Yes [provider]  metFORMIN (GLUCOPHAGE) 500 MG tablet Take 1,000 mg by mouth 2 (two) times daily with a meal.   Yes [provider]  OXBRYTA 500 MG TABS tablet Take 3 tablets by mouth at bedtime.  08/28/19  Yes [provider]  torsemide (DEMADEX) 20 MG tablet Take 1 tablet (20 mg total) by mouth daily. 09/04/19  Yes Georgette Shell, MD   Vitamin D, Ergocalciferol, (DRISDOL) 1.25 MG (50000 UT) CAPS capsule Take 100,000 Units by mouth every Sunday.  01/26/18  Yes [provider]  blood  glucose meter kit and supplies Dispense based on patient and insurance preference. Use up to four times daily as directed. (FOR ICD-10 E10.9, E11.9). 09/04/19   Georgette Shell, MD  cyclobenzaprine (FLEXERIL) 10 MG tablet Take 10 mg by mouth 3 (three) times daily as needed for muscle spasms.    [provider]  digoxin (LANOXIN) 0.125 MG tablet Take 1 tablet (0.125 mg total) by mouth daily. 09/05/19   Georgette Shell, MD  Etonogestrel [facility]) Inject 1 each into the skin once. Implanted spring 2019    [provider]  potassium chloride SA (K-DUR) 20 MEQ tablet Take 1 tablet (20 mEq total) by mouth daily. 09/11/18   Darrick Grinder D, NP    ___________________________________________________________________________________________________ Physical Exam: Vitals with BMI 05/27/2020 05/27/2020 05/27/2020  Height '4\' 11"'  - -  Weight 140 lbs - -  BMI 94.85 - -  Systolic 462 703 500  Diastolic 75 79 77  Pulse 938 124 126     1. General:  in No  Acute distress     Chronically ill  -appearing 2. Psychological: Alert and   Oriented 3. Head/ENT:   Moist  Mucous Membranes                          Head Non traumatic, neck supple                           Poor Dentition +JVD 4. SKIN: normal  Skin turgor,  Skin clean Dry and intact no rash 5. Heart: Regular rate and rhythm no  Murmur, no Rub or gallop 6. Lungs:   no wheezes or crackles   7. Abdomen: Soft,  non-tender, Non distended  bowel sounds present 8. Lower extremities: no clubbing, cyanosis, 2+edema 9. Neurologically Grossly intact, moving all 4 extremities equally   10. MSK: Normal range of motion    Chart has been reviewed  ______________________________________________________________________________________________  Assessment/Plan  35 y.o. female  with medical history significant of cardiomegaly, CHF, chronic pain, CVA, HTN, PCOS, sickle cell anemia, DM2, chronic right heart strain, pulmonary Hypertension, CKD stage 3, HTN, transient A.fib Admitted for acute on chronic Right heart failure  Present on Admission: . Acute on chronic right-sided heart failure ([place]) - - admit on telemetry,  cycle cardiac enzymes, 34  obtain serial ECG  to evaluate for ischemia as a cause of heart failure  monitor daily weight:  Filed Weights   05/27/20 1546 05/27/20 2135  Weight: 63.8 kg 63.5 kg   Last BNP BNP (last 3 results) Recent Labs    05/27/20 1733  BNP 1,228.5*     diurese with IV lasix and monitor orthostatics and creatinine to avoid over diuresis.  Order echogram to evaluate EF and valves   cardiology consulted   . Sickle cell anemia (HCC) chronic stable continue home medications  . Pulmonary hypertension ([place]) -appreciate cardiology input  . Leukocytosis no evidence of infectious process   . CKD (chronic kidney disease) stage 3, GFR 30-59 ml/min (HCC) -  -chronic avoid nephrotoxic medications such as NSAIDs, Vanco Zosyn combo,  avoid hypotension, continue to follow renal function   . Elevated bilirubin  - chronic abnormal LFTs history of febrile hepatomegaly in the past Will obtain hepatitis serologies and ultrasound  Dm2-  - Order Sensitive  SSI   - continue home insulin regimen   -  check TSH and HgA1C  - Hold by mouth  medications    Other plan as per orders.  DVT prophylaxis:   Lovenox       Code Status:    Code Status: Prior FULL CODE  as per patient   I had personally discussed CODE STATUS with patien     Family Communication:   Family not at  Bedside    Disposition Plan:     To home once workup is complete and patient is stable   Following barriers for discharge:                            Electrolytes corrected                                                                                     Will need  consultants to evaluate patient prior to discharge                       Would benefit from PT/OT eval prior to DC  Ordered                                     Diabetes care coordinator                                     Nutrition    consulted                                      Consults called: emailed cardiology     Admission status:  ED Disposition    ED Disposition Condition [place]: [place] [[id number]]  Level of Care: Telemetry Cardiac [[id number]]  May admit patient to [facility] or [facility] if equivalent level of care is available:: No  Interfacility transfer: Yes  Covid Evaluation: Asymptomatic Screening Protocol (No Symptoms)  Diagnosis: Acute exacerbation of CHF (congestive heart failure) [facility]) [[id number]]  Admitting Physician: Marcelyn Bruins [[id number]]  Attending Physician: Marcelyn Bruins [phone]  Estimated length of stay: past midnight tomorrow  Certification:: I certify this patient will need inpatient services for at least 2 midnights          inpatient     I Expect 2 midnight stay secondary to severity of patient's current illness need for inpatient interventions justified by the following:    Severe lab/radiological/exam abnormalities including:    CHF exacerbation and extensive comorbidities including:    Chronic pain  DM2    CHF     CKD    liver disease     sickle cell disease    That are currently affecting medical management.   I expect  patient to be hospitalized for 2 midnights requiring inpatient medical care.  Patient is at high risk for adverse outcome (such as loss of life or disability) if not treated.  Indication for inpatient stay as follows:  Need for IV diuretics    Level of care    tele  For   24H       Lab Results  Component Value Date   [place] NEGATIVE 05/27/2020     Precautions: admitted as  Covid Negative     PPE: Used by the provider:   N95  eye Goggles,   Gloves    Elveria Lauderbaugh 05/28/2020, 1:01 AM    [facility]     after 2 AM please page floor coverage PA If 7AM-7PM, please contact the day team taking care of the patient using [email or web address]   Patient was evaluated in the context of the global COVID-19 pandemic, which necessitated consideration that the patient might be at risk for infection with the SARS-CoV-2 virus that causes COVID-19. Institutional protocols and algorithms that pertain to the evaluation of patients at risk for COVID-19 are in a state of rapid change based on information released by regulatory bodies including the CDC and federal and state organizations. These policies and algorithms were followed during the patient's care.

## 2020-05-28 ENCOUNTER — Inpatient Hospital Stay (HOSPITAL_COMMUNITY): Payer: Medicaid Other

## 2020-05-28 DIAGNOSIS — I517 Cardiomegaly: Secondary | ICD-10-CM

## 2020-05-28 DIAGNOSIS — D571 Sickle-cell disease without crisis: Secondary | ICD-10-CM | POA: Diagnosis not present

## 2020-05-28 DIAGNOSIS — I361 Nonrheumatic tricuspid (valve) insufficiency: Secondary | ICD-10-CM | POA: Diagnosis not present

## 2020-05-28 DIAGNOSIS — I50812 Chronic right heart failure: Secondary | ICD-10-CM | POA: Diagnosis not present

## 2020-05-28 DIAGNOSIS — E1122 Type 2 diabetes mellitus with diabetic chronic kidney disease: Secondary | ICD-10-CM | POA: Diagnosis not present

## 2020-05-28 DIAGNOSIS — I272 Pulmonary hypertension, unspecified: Secondary | ICD-10-CM | POA: Diagnosis not present

## 2020-05-28 DIAGNOSIS — I340 Nonrheumatic mitral (valve) insufficiency: Secondary | ICD-10-CM

## 2020-05-28 DIAGNOSIS — N1832 Chronic kidney disease, stage 3b: Secondary | ICD-10-CM | POA: Diagnosis not present

## 2020-05-28 DIAGNOSIS — I50813 Acute on chronic right heart failure: Secondary | ICD-10-CM | POA: Diagnosis not present

## 2020-05-28 LAB — CBC WITH DIFFERENTIAL/PLATELET
Abs Immature Granulocytes: 0.12 10*3/uL — ABNORMAL HIGH (ref 0.00–0.07)
Basophils Absolute: 0.1 10*3/uL (ref 0.0–0.1)
Basophils Relative: 1 %
Eosinophils Absolute: 0.1 10*3/uL (ref 0.0–0.5)
Eosinophils Relative: 1 %
HCT: 19.9 % — ABNORMAL LOW (ref 36.0–46.0)
Hemoglobin: 7.4 g/dL — ABNORMAL LOW (ref 12.0–15.0)
Immature Granulocytes: 1 %
Lymphocytes Relative: 23 %
Lymphs Abs: 2.9 10*3/uL (ref 0.7–4.0)
MCH: 45.7 pg — ABNORMAL HIGH (ref 26.0–34.0)
MCHC: 37.2 g/dL — ABNORMAL HIGH (ref 30.0–36.0)
MCV: 122.8 fL — ABNORMAL HIGH (ref 80.0–100.0)
Monocytes Absolute: 1.8 10*3/uL — ABNORMAL HIGH (ref 0.1–1.0)
Monocytes Relative: 15 %
Neutro Abs: 7.5 10*3/uL (ref 1.7–7.7)
Neutrophils Relative %: 59 %
Platelets: 194 10*3/uL (ref 150–400)
RBC: 1.62 MIL/uL — ABNORMAL LOW (ref 3.87–5.11)
RDW: 17.2 % — ABNORMAL HIGH (ref 11.5–15.5)
WBC: 12.5 10*3/uL — ABNORMAL HIGH (ref 4.0–10.5)
nRBC: 6.5 % — ABNORMAL HIGH (ref 0.0–0.2)

## 2020-05-28 LAB — T4, FREE: Free T4: 1.27 ng/dL — ABNORMAL HIGH (ref 0.61–1.12)

## 2020-05-28 LAB — COMPREHENSIVE METABOLIC PANEL
ALT: 85 U/L — ABNORMAL HIGH (ref 0–44)
AST: 89 U/L — ABNORMAL HIGH (ref 15–41)
Albumin: 2.8 g/dL — ABNORMAL LOW (ref 3.5–5.0)
Alkaline Phosphatase: 106 U/L (ref 38–126)
Anion gap: 8 (ref 5–15)
BUN: 50 mg/dL — ABNORMAL HIGH (ref 6–20)
CO2: 17 mmol/L — ABNORMAL LOW (ref 22–32)
Calcium: 8.5 mg/dL — ABNORMAL LOW (ref 8.9–10.3)
Chloride: 107 mmol/L (ref 98–111)
Creatinine, Ser: 1.63 mg/dL — ABNORMAL HIGH (ref 0.44–1.00)
GFR, Estimated: 43 mL/min — ABNORMAL LOW (ref >60)
Glucose, Bld: 170 mg/dL — ABNORMAL HIGH (ref 70–99)
Potassium: 4.9 mmol/L (ref 3.5–5.1)
Sodium: 132 mmol/L — ABNORMAL LOW (ref 135–145)
Total Bilirubin: 5.6 mg/dL — ABNORMAL HIGH (ref 0.3–1.2)
Total Protein: 9.5 g/dL — ABNORMAL HIGH (ref 6.5–8.1)

## 2020-05-28 LAB — HEPATITIS PANEL, ACUTE
HCV Ab: NONREACTIVE
Hep A IgM: NONREACTIVE
Hep B C IgM: NONREACTIVE
Hepatitis B Surface Ag: NONREACTIVE

## 2020-05-28 LAB — GLUCOSE, CAPILLARY
Glucose-Capillary: 119 mg/dL — ABNORMAL HIGH (ref 70–99)
Glucose-Capillary: 135 mg/dL — ABNORMAL HIGH (ref 70–99)
Glucose-Capillary: 201 mg/dL — ABNORMAL HIGH (ref 70–99)
Glucose-Capillary: 232 mg/dL — ABNORMAL HIGH (ref 70–99)
Glucose-Capillary: 235 mg/dL — ABNORMAL HIGH (ref 70–99)
Glucose-Capillary: 83 mg/dL (ref 70–99)

## 2020-05-28 LAB — RAPID URINE DRUG SCREEN, HOSP PERFORMED
Amphetamines: NOT DETECTED
Barbiturates: NOT DETECTED
Benzodiazepines: NOT DETECTED
Cocaine: NOT DETECTED
Opiates: NOT DETECTED
Tetrahydrocannabinol: NOT DETECTED

## 2020-05-28 LAB — ECHOCARDIOGRAM COMPLETE
Area-P 1/2: 5.84 cm2
Calc EF: 47.9 %
Height: 59 in
MV M vel: 4.58 m/s
MV Peak grad: 83.7 mmHg
MV VTI: 1.7 cm2
Radius: 0.4 cm
S' Lateral: 3.6 cm
Single Plane A2C EF: 53.2 %
Single Plane A4C EF: 39.9 %
Weight: 2219.2 oz

## 2020-05-28 LAB — PROTIME-INR
INR: 1.4 — ABNORMAL HIGH (ref 0.8–1.2)
Prothrombin Time: 16.3 seconds — ABNORMAL HIGH (ref 11.4–15.2)

## 2020-05-28 LAB — MAGNESIUM: Magnesium: 2.4 mg/dL (ref 1.7–2.4)

## 2020-05-28 LAB — SODIUM, URINE, RANDOM: Sodium, Ur: 105 mmol/L

## 2020-05-28 LAB — BETA-HYDROXYBUTYRIC ACID: Beta-Hydroxybutyric Acid: 0.07 mmol/L (ref 0.05–0.27)

## 2020-05-28 LAB — PHOSPHORUS: Phosphorus: 4.5 mg/dL (ref 2.5–4.6)

## 2020-05-28 LAB — CREATININE, URINE, RANDOM: Creatinine, Urine: 19.85 mg/dL

## 2020-05-28 LAB — HIV ANTIBODY (ROUTINE TESTING W REFLEX): HIV Screen 4th Generation wRfx: NONREACTIVE

## 2020-05-28 LAB — TSH: TSH: 5.929 u[IU]/mL — ABNORMAL HIGH (ref 0.350–4.500)

## 2020-05-28 MED ORDER — ONDANSETRON HCL 4 MG/2ML IJ SOLN
4.0000 mg | Freq: Once | INTRAMUSCULAR | Status: AC
  Administered 2020-05-28: 4 mg via INTRAVENOUS
  Filled 2020-05-28: qty 2

## 2020-05-28 MED ORDER — FUROSEMIDE 10 MG/ML IJ SOLN
80.0000 mg | Freq: Two times a day (BID) | INTRAMUSCULAR | Status: DC
  Administered 2020-05-28 – 2020-05-29 (×2): 80 mg via INTRAVENOUS
  Filled 2020-05-28 (×2): qty 8

## 2020-05-28 MED ORDER — GUAIFENESIN-DM 100-10 MG/5ML PO SYRP
5.0000 mL | ORAL_SOLUTION | ORAL | Status: DC | PRN
  Administered 2020-05-30 – 2020-05-31 (×4): 5 mL via ORAL
  Filled 2020-05-28 (×5): qty 5

## 2020-05-28 MED ORDER — SODIUM CHLORIDE 0.9% FLUSH
10.0000 mL | INTRAVENOUS | Status: DC | PRN
Start: 2020-05-28 — End: 2020-05-31

## 2020-05-28 MED ORDER — ONDANSETRON HCL 4 MG/2ML IJ SOLN
4.0000 mg | Freq: Four times a day (QID) | INTRAMUSCULAR | Status: DC | PRN
  Administered 2020-05-29: 4 mg via INTRAVENOUS
  Filled 2020-05-28 (×2): qty 2

## 2020-05-28 MED ORDER — CHLORHEXIDINE GLUCONATE CLOTH 2 % EX PADS
6.0000 | MEDICATED_PAD | Freq: Every day | CUTANEOUS | Status: DC
  Administered 2020-05-28 – 2020-06-08 (×12): 6 via TOPICAL

## 2020-05-28 MED ORDER — SODIUM CHLORIDE 0.9% FLUSH
10.0000 mL | Freq: Two times a day (BID) | INTRAVENOUS | Status: DC
  Administered 2020-05-28 – 2020-06-04 (×11): 10 mL
  Administered 2020-06-06: 20 mL
  Administered 2020-06-07 – 2020-06-11 (×7): 10 mL

## 2020-05-28 MED ORDER — FENTANYL CITRATE (PF) 100 MCG/2ML IJ SOLN
12.5000 ug | Freq: Once | INTRAMUSCULAR | Status: AC
  Administered 2020-05-28: 12.5 ug via INTRAVENOUS
  Filled 2020-05-28: qty 2

## 2020-05-28 MED ORDER — ADULT MULTIVITAMIN W/MINERALS CH
1.0000 | ORAL_TABLET | Freq: Every day | ORAL | Status: DC
  Administered 2020-05-28 – 2020-06-11 (×15): 1 via ORAL
  Filled 2020-05-28 (×15): qty 1

## 2020-05-28 MED ORDER — ENSURE ENLIVE PO LIQD
237.0000 mL | Freq: Two times a day (BID) | ORAL | Status: DC
  Administered 2020-05-29 – 2020-06-04 (×3): 237 mL via ORAL

## 2020-05-28 MED ORDER — INSULIN GLARGINE 100 UNIT/ML ~~LOC~~ SOLN
25.0000 [IU] | Freq: Every day | SUBCUTANEOUS | Status: DC
  Administered 2020-05-28 – 2020-06-01 (×4): 25 [IU] via SUBCUTANEOUS
  Filled 2020-05-28 (×7): qty 0.25

## 2020-05-28 NOTE — Progress Notes (Signed)
OT Cancellation Note  Patient Details Name: Meah Jiron MRN: [id number] DOB: [birth year]   Cancelled Treatment:    Reason Eval/Treat Not Completed: OT screened, no needs identified, will sign off  Evern Bio 05/28/2020, 2:48 PM  Martie Round, OTR/L Acute Rehabilitation Services Pager: [phone] Office: [phone]

## 2020-05-28 NOTE — Progress Notes (Addendum)
PROGRESS NOTE    Pamela Hudson  MGQ:[id number] DOB: [birth year] DOA: 05/27/2020 PCP: Judd Lien, PA-C    Brief Narrative:  Pamela Hudson is a 35 y.o. female with medical history significant of cardiomegaly, CHF, chronic pain, CVA, HTN, PCOS, sickle cell anemia, DM2, chronic right heart strain, pulmonary Hypertension, CKD stage 3, HTN, transient A.fib COVID 2020    Presented with   Gradual leg swelling patient with CHF, chronic pain.  Patient was concerned that she was volume overloaded.  She has been having also worsening shortness of breath for the past few days to weeks.  States she has been taking her medication Reports up to 5 pound weight gain over the past 3 days    Consultants:     Procedures:   Antimicrobials:       Subjective: Less sob today, but not at baseline. +doe.no cp  Objective: Vitals:   05/28/20 0026 05/28/20 0416 05/28/20 0745 05/28/20 0900  BP: 96/72 93/66 97/65  100/73  Pulse: 79 (!) 120 (!) 106 (!) 105  Resp: 20 17 18 17   Temp: 99 F (37.2 C) 99.2 F (37.3 C) 99.2 F (37.3 C)   TempSrc: Oral Oral Oral   SpO2: 98% 98% 100% 100%  Weight:  62.9 kg    Height:        Intake/Output Summary (Last 24 hours) at 05/28/2020 1048 Last data filed at 05/28/2020 0253 Gross per 24 hour  Intake 237 ml  Output 400 ml  Net -163 ml   Filed Weights   05/27/20 1546 05/27/20 2135 05/28/20 0416  Weight: 63.8 kg 63.5 kg 62.9 kg    Examination:  General exam: Appears calm and comfortable  Respiratory system: +fine crackles at bases Cardiovascular system: S1 & S2 heard, RRR. No JVD, murmurs, rubs, gallops or clicks.  Gastrointestinal system: Abdomen is nondistended, soft and nontender.  Normal bowel sounds heard. Central nervous system: Alert and oriented. No focal neurological deficits. Extremities: trace edema b/l Skin: warm,dry Psychiatry: Judgement and insight appear normal. Mood & affect appropriate.     Data Reviewed: I have personally reviewed  following labs and imaging studies  CBC: Recent Labs  Lab 05/27/20 1604 05/28/20 0221  WBC 12.8* 12.5*  NEUTROABS 7.9* 7.5  HGB 7.8* 7.4*  HCT 20.9* 19.9*  MCV 122.2* 122.8*  PLT 222 194   Basic Metabolic Panel: Recent Labs  Lab 05/27/20 1700 05/28/20 0221  NA 132* 132*  K 5.1 4.9  CL 107 107  CO2 15* 17*  GLUCOSE 202* 170*  BUN 55* 50*  CREATININE 1.49* 1.63*  CALCIUM 8.5* 8.5*  MG  --  2.4  PHOS  --  4.5   GFR: Estimated Creatinine Clearance: 40 mL/min (A) (by C-G formula based on SCr of 1.63 mg/dL (H)). Liver Function Tests: Recent Labs  Lab 05/27/20 1700 05/28/20 0221  AST 98* 89*  ALT 88* 85*  ALKPHOS 118 106  BILITOT 5.0* 5.6*  PROT 9.4* 9.5*  ALBUMIN 3.2* 2.8*   Recent Labs  Lab 05/27/20 1700  LIPASE 30   No results for input(s): AMMONIA in the last 168 hours. Coagulation Profile: Recent Labs  Lab 05/28/20 0221  INR 1.4*   Cardiac Enzymes: No results for input(s): CKTOTAL, CKMB, CKMBINDEX, TROPONINI in the last 168 hours. BNP (last 3 results) No results for input(s): PROBNP in the last 8760 hours. HbA1C: No results for input(s): HGBA1C in the last 72 hours. CBG: Recent Labs  Lab 05/27/20 1602 05/28/20 0021 05/28/20 0418 05/28/20 05/30/20  GLUCAP 182* 135* 119* 83   Lipid Profile: No results for input(s): CHOL, HDL, LDLCALC, TRIG, CHOLHDL, LDLDIRECT in the last 72 hours. Thyroid Function Tests: Recent Labs    05/27/20 2251 05/28/20 0221  TSH  --  5.929*  FREET4 1.27*  --    Anemia Panel: Recent Labs    05/27/20 1604  RETICCTPCT 7.6*   Sepsis Labs: No results for input(s): PROCALCITON, LATICACIDVEN in the last 168 hours.  Recent Results (from the past 240 hour(s))  Resp Panel by RT-PCR (Flu A&B, Covid) Nasopharyngeal Swab     Status: None   Collection Time: 05/27/20  4:26 PM   Specimen: Nasopharyngeal Swab; Nasopharyngeal(NP) swabs in vial transport medium  Result Value Ref Range Status   SARS Coronavirus 2 by RT PCR  NEGATIVE NEGATIVE Final    Comment: (NOTE) SARS-CoV-2 target nucleic acids are NOT DETECTED.  The SARS-CoV-2 RNA is generally detectable in upper respiratory specimens during the acute phase of infection. The lowest concentration of SARS-CoV-2 viral copies this assay can detect is 138 copies/mL. A negative result does not preclude SARS-Cov-2 infection and should not be used as the sole basis for treatment or other patient management decisions. A negative result may occur with  improper specimen collection/handling, submission of specimen other than nasopharyngeal swab, presence of viral mutation(s) within the areas targeted by this assay, and inadequate number of viral copies(<138 copies/mL). A negative result must be combined with clinical observations, patient history, and epidemiological information. The expected result is Negative.  Fact Sheet for Patients:  BloggerCourse.comhttps://www.fda.gov/media/152166/download  Fact Sheet for Healthcare Providers:  SeriousBroker.ithttps://www.fda.gov/media/152162/download  This test is no t yet approved or cleared by the [place] FDA and  has been authorized for detection and/or diagnosis of SARS-CoV-2 by FDA under an Emergency Use Authorization (EUA). This EUA will remain  in effect (meaning this test can be used) for the duration of the COVID-19 declaration under Section 564(b)(1) of the Act, 21 U.S.C.section 360bbb-3(b)(1), unless the authorization is terminated  or revoked sooner.       Influenza A by PCR NEGATIVE NEGATIVE Final   Influenza B by PCR NEGATIVE NEGATIVE Final    Comment: (NOTE) The Xpert Xpress SARS-CoV-2/FLU/RSV plus assay is intended as an aid in the diagnosis of influenza from Nasopharyngeal swab specimens and should not be used as a sole basis for treatment. Nasal washings and aspirates are unacceptable for Xpert Xpress SARS-CoV-2/FLU/RSV testing.  Fact Sheet for Patients: BloggerCourse.comhttps://www.fda.gov/media/152166/download  Fact Sheet for  Healthcare Providers: SeriousBroker.ithttps://www.fda.gov/media/152162/download  This test is not yet approved or cleared by the [place] FDA and has been authorized for detection and/or diagnosis of SARS-CoV-2 by FDA under an Emergency Use Authorization (EUA). This EUA will remain in effect (meaning this test can be used) for the duration of the COVID-19 declaration under Section 564(b)(1) of the Act, 21 U.S.C. section 360bbb-3(b)(1), unless the authorization is terminated or revoked.  Performed at [facility] [place], [address] [place], [place] [address]          Radiology Studies: US Abdomen Complete  Result Date: 05/28/2020 CLINICAL DATA:  Elevated liver function tests, chronic renal insufficiency EXAM: ABDOMEN ULTRASOUND COMPLETE COMPARISON:  04/17/2020 FINDINGS: Gallbladder: Surgically absent Common bile duct: Diameter: 4 mm Liver: Stable heterogeneous increased liver echotexture most consistent with hepatic steatosis. No focal liver abnormality. No intrahepatic duct dilation. Portal vein is patent on color Doppler imaging with normal direction of blood flow towards the liver. IVC: No abnormality visualized. Pancreas: Visualized portion  unremarkable. Spleen: Suboptimally visualized due to splenic atrophy noted on previous CT exams. Right Kidney: Length: 11.7 cm. Echogenicity within normal limits. No mass or hydronephrosis visualized. Left Kidney: Length: 10.3 cm. Echogenicity within normal limits. No mass or hydronephrosis visualized. Abdominal aorta: No aneurysm visualized. Other findings: None. IMPRESSION: 1. Increased liver echotexture compatible with hepatic steatosis. 2. Status post cholecystectomy. 3. 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 Electronically Signed   By: Sharlet Salina M.D.   On: 05/28/2020 02:18   DG Chest Portable 1 View  Result Date: 05/27/2020 CLINICAL DATA:  Weakness and shortness of breath. EXAM:  PORTABLE CHEST 1 VIEW COMPARISON:  08/30/2019 FINDINGS: Right chest wall port a catheter is noted with tip in the SVC. Cardiac enlargement, unchanged. New right pleural effusion with overlying subsegmental atelectasis. Pulmonary vascular congestion. The visualized osseous structures are unremarkable. IMPRESSION: 1. Cardiac enlargement, pulmonary vascular congestion, and new right pleural effusion with overlying subsegmental atelectasis. Findings concerning for CHF. Electronically Signed   By: Signa Kell M.D.   On: 05/27/2020 17:04        Scheduled Meds:  enoxaparin (LOVENOX) injection  40 mg Subcutaneous Daily   furosemide  40 mg Intravenous BID   hydroxyurea  1,000 mg Oral Daily   insulin aspart  0-9 Units Subcutaneous Q4H   insulin glargine  25 Units Subcutaneous QHS   sodium chloride flush  3 mL Intravenous Q12H   Continuous Infusions:  sodium chloride      Assessment & Plan:   Active Problems:   Sickle cell anemia (HCC)   Elevated bilirubin   Leukocytosis   Dyspnea   Acute on chronic right-sided heart failure (HCC)   Chronic right-sided heart failure (HCC)   Pulmonary hypertension (HCC)   CKD (chronic kidney disease) stage 3, GFR 30-59 ml/min (HCC)   DM2 (diabetes mellitus, type 2) (HCC)   35 y.o. female with medical history significant of cardiomegaly, CHF, chronic pain, CVA, HTN, PCOS, sickle cell anemia, DM2, chronic right heart strain, pulmonary Hypertension, CKD stage 3, HTN, transient A.fib Admitted for acute on chronic Right heart failure  Present on Admission: Acute on Chronic DHF Echo pending Clinically improving, but still volume overloaded I/o Daily weight Continue iv lasix    Sickle cell anemia (HCC)  Chronic, stable h/h Continue on Hydroxyurea    Pulmonary hypertension (HCC) -appreciate cardiology input   Leukocytosis -chronic , no evidence of infectious process    CKD (chronic kidney disease) stage 3b Mildly worse, cre 1.63  today. Continue to monitor closely Avoid nephrotoxic meds   Elevated bilirubin  - chronic abnormal LFTs history of febrile hepatomegaly in the past Will obtain hepatitis serologies and ultrasound  Dm2-  - Order Sensitive  SSI   - continue home insulin regimen   -  check TSH and HgA1C      DVT prophylaxis: Lovenox Code Status: Full Family Communication: None at bedside  Status is: Inpatient  Remains inpatient appropriate because:IV treatments appropriate due to intensity of illness or inability to take PO   Dispo: The patient is from: Home              Anticipated d/c is to: Home              Patient currently is not medically stable to d/c.   Difficult to place patient No  Patient still receiving IV Lasix due to volume overload          LOS: 1  day   Time spent: 35 minutes with more than 50% on COC    Lynn Ito, MD [facility] Pager [phone]  If 7PM-7AM, please contact night-coverage 05/28/2020, 10:48 AM

## 2020-05-28 NOTE — Plan of Care (Signed)

## 2020-05-28 NOTE — Progress Notes (Signed)
Temp 101.3. Pt c/o cough and 5/10 left chest pain while laying on her left side. Peripheral blood cultures x2 and PRN robitussin ordered per standing orders. MD paged.  Pt has double port-a-cath. 1 port has just now been accessed by IV team. Pt reports last access was 3 months ago.

## 2020-05-28 NOTE — Progress Notes (Signed)
Inpatient Diabetes Program Recommendations  AACE/ADA: New Consensus Statement on Inpatient Glycemic Control (2015)  Target Ranges:  Prepandial:   less than 140 mg/dL      Peak postprandial:   less than 180 mg/dL (1-2 hours)      Critically ill patients:  140 - 180 mg/dL   Lab Results  Component Value Date   GLUCAP 83 05/28/2020   HGBA1C 7.8 (H) 08/31/2019    Review of Glycemic Control Results for Pamela Hudson, Pamela Hudson (MRN [id number]) as of 05/28/2020 10:31  Ref. Range 09/04/2019 06:03 09/04/2019 07:22 09/04/2019 11:32 05/27/2020 16:02 05/28/2020 00:21 05/28/2020 04:18 05/28/2020 07:46  Glucose-Capillary Latest Ref Range: 70 - 99 mg/dL 56 (L) 681 (H) 157 (H) 182 (H) 135 (H) 119 (H) 83   Diabetes history: DM Outpatient Diabetes medications: Lantus 30 units qd + Novolog 5 units tid meal coverage + Jardiance 25 mg + Metformin 1 gm bid Current orders for Inpatient glycemic control: Lantus 30 units qd + Novolog 0-9 units q 4 hrs.  Inpatient Diabetes Program Recommendations:   Fasting CBG 83 -Decrease Lantus to 25 units qd -Change Novolog correction to tid with meals + hs 0-5 units -Add Novolog meal coverage as needed Secure chat sent to Dr. Marylu Lund.  Thank you, Billy Fischer. Hanks, RN, MSN, CDE  Diabetes Coordinator Inpatient Glycemic Control Team Team Pager [phone] (8am-5pm) 05/28/2020 10:37 AM

## 2020-05-28 NOTE — Progress Notes (Signed)
Initial Nutrition Assessment  DOCUMENTATION CODES:   Not applicable  INTERVENTION:   -MVI with minerals po daily   -Ensure Enlive BID, each supplement provides 350 kcal, 20 grams protein  NUTRITION DIAGNOSIS:   Increased nutrient needs related to chronic illness (CHF, CKD3b) as evidenced by estimated needs.  GOAL:   Patient will meet greater than or equal to 90% of their needs  MONITOR:   PO intake,Supplement acceptance,Skin,I & O's,Labs,Weight trends  REASON FOR ASSESSMENT:   Consult Assessment of nutrition requirement/status  ASSESSMENT:   47 YOF admitted for sickle cell anemia. PMH of CVA, sickle cell anemia, PCOS, chronic pain, DM2, CKD3b, cardiomegaly, liver disease, HTN, CHF, cholecystectomy.  Per MD note, pt presented with gradual leg swelling and chronic pain. Pt reported a 5# weight gain over the past 3 days.   Per chart meal documentation, pt has been consuming 100% of meals. Pt reports that she's had a good appetite since being admitted and PTA. Pt reports consuming 2 meals a day and a snack, with a usual intake of:  Lunch: typically leftovers from last night dinner (chicken or ground [place] or some other meat, starch, vegetable), fruit or fruit cup Dinner: similar to lunch (meat, starch, vegetable) Snack: bag of chips or granola bars Pt reports that she does not currently consume any oral nutrition supplements at home. Pt reports that she has tried Glucerna's before and likes them, but would be trying other supplements as well while she's admitted.   Pt reports a UBW of 130-133#. Pt reports that she has gained fluid weight and weighed 140# when admitted, however does not feel she's gained or lost any dry weight. Pt's weights reviewed and show consistent weights for the past year.  Pt reports that she is mobile at home without assistance.    Meds reviewed: Lasix (BID), Hydrea (daily) Labs reviewed: Sodium (132, low), Corrected Calcium (9.46), CBG  (83-201)  I&O's reviewed: -163 L since admit  UOP reviewed: 400 mL x 24 hrs  NUTRITION - FOCUSED PHYSICAL EXAM:  Flowsheet Row Most Recent Value  Orbital Region No depletion  Upper Arm Region Mild depletion  Thoracic and Lumbar Region No depletion  Buccal Region No depletion  Temple Region No depletion  Clavicle Bone Region No depletion  Clavicle and Acromion Bone Region No depletion  Scapular Bone Region No depletion  Dorsal Hand Mild depletion  Patellar Region No depletion  Anterior Thigh Region No depletion  Posterior Calf Region No depletion  Edema (RD Assessment) Moderate  [bilateral lower extremities]  Hair Reviewed  Eyes Reviewed  Mouth Reviewed  Skin Reviewed  Nails Reviewed       Diet Order:   Diet Order            Diet heart healthy/carb modified Room service appropriate? Yes; Fluid consistency: Thin  Diet effective now                 EDUCATION NEEDS:   No education needs have been identified at this time  Skin:  Skin Assessment: Reviewed RN Assessment  Last BM:  Unknown.  Height:   Ht Readings from Last 1 Encounters:  05/27/20 4\' 11"  (1.499 m)    Weight:   Wt Readings from Last 1 Encounters:  05/28/20 62.9 kg    Ideal Body Weight:  44.7 kg  BMI:  Body mass index is 28.01 kg/m.  Estimated Nutritional Needs:   Kcal:  1650-1850  Protein:  80-95 g  Fluid:  >/=1.65 L  John Giovanni, Dietetic Intern 05/28/2020 1:34 PM

## 2020-05-28 NOTE — Progress Notes (Signed)
Pt c/o nausea.  MD paged. 

## 2020-05-28 NOTE — Consult Note (Addendum)
Cardiology Consultation:   Patient ID: Pamela Hudson MRN: [id number]; DOB: [birth year]  Admit date: 05/27/2020 Date of Consult: 05/28/2020  PCP:  Joella Prince   [place]  Cardiologist:  No primary care provider on file.  Advanced Practice Provider:  No care team member to display Electrophysiologist:  None    Patient Profile:   Pamela Hudson is a 35 y.o. 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 Kurtis Bushman.  History of Present Illness:   Pamela Hudson is a 35 yo female with PMH noted above. She has been seen briefly by Dr. Einar Gip in the past. Was admitted twice back in 2020 with CHF. Initially in 06/2018 where she was diuresed. Echo 07/07/18 showed EF of 65-70% with mild LVH, severe TR with moderate to severe PAH. RHC was considered but not felt to be necessary. Followed up with pulmonary with Dr. Marijo File at [facility] in [place]. Was noted to be again volume overloaded at that visit and her lasix was transitioned to torsemide. Diagnosed with restrictive lung disease as well as severe OSA.  Presented to [facility] 08/2018 with worsening volume overload and weight was up to 245lbs. Attempts made at diuresis but she did not tolerate well. She was seen by Dr. Einar Gip and taken for [place]   [place] on 09/01/18 RA 34 (with large v-waves) RV 50/31 PA 55/26 (39) PCWP 24 Ao 93% PA sat 66% RA sat 68% Fick 12.9/6.4 PVR = 1.16 WU PAPi = 0.76  It was felt she had a mixed picture of advanced PAH with resulting RV failure. Placed on milrinone for RV support and IV lasix. She diuresed >70lbs. Was transitioned to torsemide 67m BID.   Seen back in the AHF clinic for follow up 09/2018 and reported doing well with stable weight.   Presented back in 08/2019 and diagnosed with new onset DM, along with Afib. Hgb A1c 7.5. In regards to her Afib she was started  on digoxin and Diltiazem. Was noted to be anemic and required 2 units of PRBCs. Seen by hematology with concern for DIC with elevated bilirubin. 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 She torsemide was decreased to 219mdaily in the setting of soft blood pressures.   [facility] with c/o increasing LE edema as well as shortness of breath for the past couple of days. Weights trending up despite being compliant with home medications. Labs showed Na +132, Cr 1.49>>1.63, albumin 3.2, BNP 1228, hsTn 34>>34, AST 98>>89, ALT 88>>85, Hgb 7.8>>7.4. CXR with right pleural effusion, atelectasis. Started on IV lasix 4068mID. [facility]  She is a difficult historian. Falling asleep during interview, minimal information provided with questioning. [facility] Noted indicate issues with good blood sugar management. Had been transitioned off digixon to metoprolol without recurrence of palpitations. Torsemide was transitioned from 6m23mily to 10/6mg48mry other Namari Breton. Given she was well compensated at this visit the decision was made to defer referral to HF clinic.  Past Medical History:  Diagnosis Date  . Cardiomegaly   . CHF (congestive heart failure) (HCC) [place] Chronic pain   . CVA (cerebral infarction) 02/2002  . Hypertension   . Liver disease    ?iron  .  PCOS (polycystic ovarian syndrome)   . Renal stone   . Sickle cell anemia (HCC)     Past Surgical History:  Procedure Laterality Date  . CHOLECYSTECTOMY    . LIVER BIOPSY  2007  . PARATHYROIDECTOMY    . PORTACATH PLACEMENT    . RIGHT HEART CATH N/A 09/01/2018   Procedure: RIGHT HEART CATH;  Surgeon: Adrian Prows, MD;  Location: [facility];  Service: Cardiovascular;  Laterality: N/A;     Home Medications:  Prior to Admission medications   Medication Sig Start Date End Date Taking? Authorizing Provider   digoxin (LANOXIN) 0.125 MG tablet Take 1 tablet (0.125 mg total) by mouth daily. 09/05/19  Yes Georgette Shell, MD  empagliflozin (JARDIANCE) 25 MG TABS tablet Take 25 mg by mouth in the morning and at bedtime.   Yes [provider]  Etonogestrel (IMPLANON [place]) Inject 1 each into the skin once. Implanted spring 2019   Yes [provider]  folic acid (FOLVITE) 1 MG tablet Take 1 mg by mouth daily. 11/21/15  Yes [provider]  hydroxyurea (HYDREA) 500 MG capsule Take 1,000 mg by mouth daily.    Yes [provider]  insulin aspart (NOVOLOG FLEXPEN) 100 UNIT/ML FlexPen Inject 5 Units into the skin 3 (three) times daily with meals. Patient taking differently: Inject 5 Units into the skin 3 (three) times daily with meals. Sliding scale 09/04/19  Yes Georgette Shell, MD  insulin glargine (LANTUS) 100 unit/mL SOPN Inject 0.25 mLs (25 Units total) into the skin daily. Patient taking differently: Inject 30 Units into the skin at bedtime. 09/04/19  Yes Georgette Shell, MD  Insulin Pen Needle 32G X 4 MM MISC 5 Units/oz/Edwin Cherian by Does not apply route in the morning, at noon, and at bedtime. 09/04/19  Yes Georgette Shell, MD  magnesium oxide (MAG-OX) 400 MG tablet Take 400 mg by mouth daily. 01/26/18  Yes [provider]  metFORMIN (GLUCOPHAGE) 500 MG tablet Take 1,000 mg by mouth 2 (two) times daily with a meal.   Yes [provider]  OXBRYTA 500 MG TABS tablet Take 1,500 tablets by mouth daily. 08/28/19  Yes [provider]  torsemide (DEMADEX) 20 MG tablet Take 1 tablet (20 mg total) by mouth daily. 09/04/19  Yes Georgette Shell, MD  Vitamin D, Ergocalciferol, (DRISDOL) 1.25 MG (50000 UT) CAPS capsule Take 50,000 Units by mouth every 30 (thirty) days. 01/26/18  Yes [provider]  blood glucose meter kit and supplies Dispense based on patient and insurance preference. Use up to four times daily as directed. (FOR ICD-10  E10.9, E11.9). 09/04/19   Georgette Shell, MD  Deferasirox 360 MG TABS Take 720 mg by mouth at bedtime.    [provider]    Inpatient Medications: Scheduled Meds: . enoxaparin (LOVENOX) injection  40 mg Subcutaneous Daily  . furosemide  40 mg Intravenous BID  . hydroxyurea  1,000 mg Oral Daily  . insulin aspart  0-9 Units Subcutaneous Q4H  . insulin glargine  25 Units Subcutaneous QHS  . sodium chloride flush  3 mL Intravenous Q12H   Continuous Infusions: . sodium chloride     PRN Meds: sodium chloride, sodium chloride flush  Allergies:    Allergies  Allergen Reactions  . Oxycodone-Acetaminophen Swelling and Other (See Comments)    Lips swell  . Prednisone Other (See Comments)    Elevates BGL    Social History:   Social History   Socioeconomic  History  . Marital status: Single    Spouse name: Not on file  . Number of children: Not on file  . Years of education: Not on file  . Highest education level: Not on file  Occupational History  . Not on file  Tobacco Use  . Smoking status: Former Research scientist (life sciences)  . Smokeless tobacco: Never Used  Vaping Use  . Vaping Use: Never used  Substance and Sexual Activity  . Alcohol use: Yes    Comment: occ  . Drug use: No  . Sexual activity: Yes    Birth control/protection: Implant  Other Topics Concern  . Not on file  Social History Narrative  . Not on file   Social Determinants of Health   Financial Resource Strain: Not on file  Food Insecurity: Not on file  Transportation Needs: Not on file  Physical Activity: Insufficiently Active  . Days of Exercise per Week: 2 days  . Minutes of Exercise per Session: 30 min  Stress: Not on file  Social Connections: Unknown  . Frequency of Communication with Friends and Family: Not on file  . Frequency of Social Gatherings with Friends and Family: Twice a week  . Attends Religious Services: 1 to 4 times per year  . Active Member of Clubs or Organizations: No  . Attends  Archivist Meetings: Never  . Marital Status: Living with partner  Intimate Partner Violence: Not on file    Family History:    Family History  Problem Relation Age of Onset  . Diabetes Mellitus II Father   . CAD Father   . Diabetes Mellitus II Paternal Grandmother      ROS:  Please see the history of present illness.   All other ROS reviewed and negative.     Physical Exam/Data:   Vitals:   05/28/20 0416 05/28/20 0745 05/28/20 0900 05/28/20 1139  BP: 93/66 97/65 100/73 101/64  Pulse: (!) 120 (!) 106 (!) 105 (!) 111  Resp: _0 Temp: 99.2 F (37.3 C) 99.2 F (37.3 C)  98.9 F (37.2 C)  TempSrc: Oral Oral  Oral  SpO2: 98% 100% 100% 100%  Weight: 62.9 kg     Height:        Intake/Output Summary (Last 24 hours) at 05/28/2020 1340 Last data filed at 05/28/2020 1242 Gross per 24 hour  Intake 654 ml  Output 400 ml  Net 254 ml   Last 3 Weights 05/28/2020 05/27/2020 05/27/2020  Weight (lbs) 138 lb 11.2 oz 139 lb 15.9 oz 140 lb 9.6 oz  Weight (kg) 62.914 kg 63.5 kg 63.776 kg     Body mass index is 28.01 kg/m.  General:  Well nourished, well developed, in no acute distress HEENT: normal Lymph: no adenopathy Neck: + JVD Endocrine:  No thryomegaly Vascular: No carotid bruits. Cardiac:  normal S1, S2; Tachy; + murmur  Lungs:  clear to auscultation bilaterally, no wheezing, rhonchi or rales  Abd: soft, nontender, no hepatomegaly  Ext: 1+ LLE edema Musculoskeletal:  No deformities, BUE and BLE strength normal and equal Skin: warm and dry  Neuro:  CNs 2-12 intact, no focal abnormalities noted Psych:  Normal affect   EKG:  The EKG was personally reviewed and demonstrates:  ST with long PR interval vs atrial tachycardia? Rate 123 bpm PVCs Telemetry:  Telemetry was personally reviewed and demonstrates:  As above ST with long PR interval vs atrial tachycardia, rates in the 110-130 range  Relevant CV Studies:  Echo: 05/28/20  IMPRESSIONS     1. There is  severe, torrential tricuspid regurgitation. The leaflets are  fixed and appear immobile. Leaflet immobility is out of proportion to  annular dilation. Would consider primary leaflet pathology such as  carcinoid involvement in the work-up in  addition to the patient's long history of severe pulmonary hypertension.  The tricuspid valve is abnormal. Tricuspid valve regurgitation is severe.   2. RVSP is severely underestimated in the setting of severe tricuspid  regurgitation. Right ventricular systolic function is severely reduced.  The right ventricular size is severely enlarged. There is normal pulmonary  artery systolic pressure. The  estimated right ventricular systolic pressure is 81.0 mmHg.   3. Left ventricular ejection fraction, by estimation, is 45 to 50%. Left  ventricular ejection fraction by 3D volume is 45 %. The left ventricle has  mildly decreased function. The left ventricle demonstrates global  hypokinesis. Indeterminate diastolic  filling due to E-A fusion. There is the interventricular septum is  flattened in systole and diastole, consistent with right ventricular  pressure and volume overload.   4. Right atrial size was severely dilated.   5. A small pericardial effusion is present. The pericardial effusion is  circumferential.   6. The mitral valve is grossly normal. Mild to moderate mitral valve  regurgitation. No evidence of mitral stenosis.   7. The aortic valve is tricuspid. Aortic valve regurgitation is not  visualized. No aortic stenosis is present.   8. The inferior vena cava is dilated in size with <50% respiratory  variability, suggesting right atrial pressure of 15 mmHg.   Comparison(s): Changes from prior study are noted. RV is severely dilated  with severely reduced function. Severe torrential TR is present. LVEF is  mildly reduced 45-50%. RVSP underestimated in setting of severe TR.   Laboratory Data:  High Sensitivity Troponin:   Recent Labs  Lab  05/27/20 1604 05/27/20 1840  TROPONINIHS 34* 34*     Chemistry Recent Labs  Lab 05/27/20 1700 05/28/20 0221  NA 132* 132*  K 5.1 4.9  CL 107 107  CO2 15* 17*  GLUCOSE 202* 170*  BUN 55* 50*  CREATININE 1.49* 1.63*  CALCIUM 8.5* 8.5*  GFRNONAA 48* 43*  ANIONGAP 10 8    Recent Labs  Lab 05/27/20 1700 05/28/20 0221  PROT 9.4* 9.5*  ALBUMIN 3.2* 2.8*  AST 98* 89*  ALT 88* 85*  ALKPHOS 118 106  BILITOT 5.0* 5.6*   Hematology Recent Labs  Lab 05/27/20 1604 05/28/20 0221  WBC 12.8* 12.5*  RBC 1.71*  1.68* 1.62*  HGB 7.8* 7.4*  HCT 20.9* 19.9*  MCV 122.2* 122.8*  MCH 45.6* 45.7*  MCHC 37.3* 37.2*  RDW 17.4* 17.2*  PLT 222 194   BNP Recent Labs  Lab 05/27/20 1733  BNP 1,228.5*    DDimer No results for input(s): DDIMER in the last 168 hours.   Radiology/Studies:  US Abdomen Complete  Result Date: 05/28/2020 CLINICAL DATA:  Elevated liver function tests, chronic renal insufficiency EXAM: ABDOMEN ULTRASOUND COMPLETE COMPARISON:  04/17/2020 FINDINGS: Gallbladder: Surgically absent Common bile duct: Diameter: 4 mm Liver: Stable heterogeneous increased liver echotexture most consistent with hepatic steatosis. No focal liver abnormality. No intrahepatic duct dilation. Portal vein is patent on color Doppler imaging with normal direction of blood flow towards the liver. IVC: No abnormality visualized. Pancreas: Visualized portion unremarkable. Spleen: Suboptimally visualized due to splenic atrophy noted on previous CT exams. Right Kidney: Length: 11.7 cm. Echogenicity within normal limits. No mass or hydronephrosis  visualized. Left Kidney: Length: 10.3 cm. Echogenicity within normal limits. No mass or hydronephrosis visualized. Abdominal aorta: No aneurysm visualized. Other findings: None. IMPRESSION: 1. Increased liver echotexture compatible with hepatic steatosis. 2. Status post cholecystectomy. 3. Suboptimal visualization of the spleen, compatible with splenic atrophy  noted on previous CT exams. Electronically Signed   By: Randa Ngo M.D.   On: 05/28/2020 02:18   DG Chest Portable 1 View  Result Date: 05/27/2020 CLINICAL DATA:  Weakness and shortness of breath. EXAM: PORTABLE CHEST 1 VIEW COMPARISON:  08/30/2019 FINDINGS: Right chest wall port a catheter is noted with tip in the SVC. Cardiac enlargement, unchanged. New right pleural effusion with overlying subsegmental atelectasis. Pulmonary vascular congestion. The visualized osseous structures are unremarkable. IMPRESSION: 1. Cardiac enlargement, pulmonary vascular congestion, and new right pleural effusion with overlying subsegmental atelectasis. Findings concerning for CHF. Electronically Signed   By: Kerby Moors M.D.   On: 05/27/2020 17:04     Assessment and Plan:   Pamela Hudson is a 35 y.o. 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 Kurtis Bushman.  1. Acute on Chronic diastolic HF with right RV failure: reports being compliant with home medications prior to admission. Mostly compliant with diet as well. BNP 1228, CXR with pulmonary edema and right sided pleural effusion.  -- started on IV lasix 63m BID, little UOP noted thus far but says she is breathing better -- will attempt to further increase lasix to 85mBID to push diuresis -- defer adding BB at this time given concern for decompensation with advanced RV failure and severe TR  2. Severe TR: echo this admission with further decline. Notable severe TR with fixed, immobile leaflets. Severely dilated RV as well as small pericardial effusion. Notable change from prior echo back in 2020. Diurese as above -- will need RHC   3. PH Group V: last RHC with mixed picture of advanced PAH with resultant RV failure.  -- [facility]. Has not been referred to HF clinic as she was  stable at last office visit -- will ultimately need to establish with AHF moving forward given worsening structural findings on echo  4. Tachycardia: carries a dx of Afib as well as SVT. Previous EKGs do no clearly demonstrate Afib. EKG this admission, ST with prolonged PR vs atrial tachycardia? Most recently was switched from Digoxin to metoprolol.  -- as above will hold BB therapy for now  5. Sickle cell anemia: stable  6. CKD III: baseline around 1.6-1.7 over the past year.  -- 1.49>>1.63 on admission -- suspect some degree of worsening renal function in the setting of acute decompensation -- follow BMET with need for diuresis -- follows with nephrology as an outpatient  7. Chronically elevated LFTs and Bilirubin: mildly elevated above baseline on admission. Could be 2/2 hepatic congestion -- hep panel negative -- management per primary  8. DM: Hgb A1c pending -- SSI  Patient with complex medical history with worsening structural heart disease in the setting of PAH as well as sickle cell anemia. Please see MD note to follow  For questions or updates, please contact [facility] consult [email or web address] for contact info under    Signed, LiReino BellisNP  05/28/2020 1:40 PM   Personally seen and examined. Agree with APP above with the following  comments: Briefly 35 yo F with mixed picture PAH (last RHC PWCP 24 with PVRs < 2) Patient notes that she has had a 20 lbs weight gain; with fluid in her belly and legs.  Notes that she has never had syncope, but notes early satiety and notes her neck veins have engorged Exam notable for tachycardia, JVD and HJR with hepatojugular reflux, abdominal fullness, dullness to percussion, abdominal hernia, and +1 pitting edema (skin is warm).  I/VI Systolic murmur Labs notable for Increase in LFTs, Creatinine stable ~ 1.67, BNP 1220 Personally reviewed relevant tests; HF borderline decrease; RV pressure volume overload with SPAP 38 mm Hg  estimated; Severe+ functional tricuspid regurgitation, IVC dilation, and RV dysfunction; small pericardial effusion (poor prognostic sign).  ECG and Telemetry consistent with atrial tachycardia (morphology is not consistent with a sinus P wave even with RAE. Would recommend aggressive IV diuresis; holding of AV nodal agent in the setting of decompensated RV failure.  Attempted to get old records [facility]:  No further RHC and no attempts for vasodilator therapy performed.  Unclear baseline 6MWD.  If worsening clinically; or clinically euvolemic; will get RHC with out AHF team.   Pulmonary Hypertension - WHO Functional Class III Stage C, hypervolemic, WHO V in the setting of Sickle Cell and likely PAH PH phenotype - Diuretic regimen: lasix 80 IV BID - Fluid restriction of < 2 L  - 6MWD:  unable to presently - No signs or symptoms of connective tissue disease and no family history of the CT or PH  - No weight loss drug use  - No methamphetamine or other illicit substance use - No history of DVT or PE to suggest chronic thromboembolic disease; VQ Scan not appropriate at this time  - Last RHC:2020 - Pulm/AHF: [facility] presently - PDEi: future - NO or equivalent: future - ERA: future   Rudean Haskell, MD Cardiologist [facility]  [address] [place], [place] [address] [phone]  6:56 PM

## 2020-05-28 NOTE — Progress Notes (Signed)
  Echocardiogram 2D Echocardiogram has been performed.  Pamela Hudson 05/28/2020, 1:18 PM

## 2020-05-28 NOTE — Evaluation (Signed)
Physical Therapy Evaluation Patient Details Name: Pamela Hudson MRN: [id number] DOB: 07-08-1985 Today's Date: 05/28/2020   History of Present Illness  35 y.o. female presenting [facility] ED on 05/27/2020 with LE swelling and SOB, concerned for volume overload. PMH includes cardiomegaly, CHF, chronic pain, CVA, HTN, PCOS, sickle cell anemia, DM2, chronic right heart strain, pulmonary Hypertension, CKD stage 3, HTN, transient A.fib COVID 2020.  Clinical Impression  Pt presents to PT with no significant functional deficits at this time. Pt is able to ambulate and negotiate stairs at a modI level. Pt reports fatiguing with climbing multiple flights of stairs at baseline but demonstrates good energy conservation strategies during stair negotiation at this time, taking breaks on landings as needed. Pt has no further acute PT needs at this time and is encouraged to ambulate out of the room at least 3 times a day for the remainder of this admission. Acute PT signing off.    Follow Up Recommendations No PT follow up    Equipment Recommendations  None recommended by PT    Recommendations for Other Services       Precautions / Restrictions Precautions Precautions: None Restrictions Weight Bearing Restrictions: No      Mobility  Bed Mobility Overal bed mobility: Independent                  Transfers Overall transfer level: Independent                  Ambulation/Gait Ambulation/Gait assistance: Modified independent (Device/Increase time) Gait Distance (Feet): 350 Feet Assistive device: None Gait Pattern/deviations: Step-through pattern Gait velocity: reduced Gait velocity interpretation: 1.31 - 2.62 ft/sec, indicative of limited community ambulator General Gait Details: pt with slowed step-through gait  Stairs Stairs: Yes Stairs assistance: Modified independent (Device/Increase time) Stair Management: One rail Right;Step to pattern Number of Stairs: 14    Wheelchair  Mobility    Modified Rankin (Stroke Patients Only)       Balance Overall balance assessment: Independent                                           Pertinent Vitals/Pain Pain Assessment: No/denies pain    Home Living Family/patient expects to be discharged to:: Private residence Living Arrangements: Spouse/significant other;Children Available Help at Discharge: Family;Available PRN/intermittently Type of Home: Apartment Home Access: Stairs to enter Entrance Stairs-Rails: Right Entrance Stairs-Number of Steps: 3 flights Home Layout: One level Home Equipment: Crutches      Prior Function Level of Independence: Independent         Comments: pt reports she experiences SOB after climbing stairs     Hand Dominance        Extremity/Trunk Assessment   Upper Extremity Assessment Upper Extremity Assessment: Overall WFL for tasks assessed    Lower Extremity Assessment Lower Extremity Assessment: Overall WFL for tasks assessed    Cervical / Trunk Assessment Cervical / Trunk Assessment: Normal  Communication   Communication: No difficulties  Cognition Arousal/Alertness: Awake/alert Behavior During Therapy: WFL for tasks assessed/performed Overall Cognitive Status: Within Functional Limits for tasks assessed                                        General Comments General comments (skin integrity, edema, etc.): VSS on RA  Exercises     Assessment/Plan    PT Assessment Patent does not need any further PT services  PT Problem List         PT Treatment Interventions      PT Goals (Current goals can be found in the Care Plan section)       Frequency     Barriers to discharge        Co-evaluation               AM-PAC PT "6 Clicks" Mobility  Outcome Measure Help needed turning from your back to your side while in a flat bed without using bedrails?: None Help needed moving from lying on your back to sitting on  the side of a flat bed without using bedrails?: None Help needed moving to and from a bed to a chair (including a wheelchair)?: None Help needed standing up from a chair using your arms (e.g., wheelchair or bedside chair)?: None Help needed to walk in hospital room?: None Help needed climbing 3-5 steps with a railing? : None 6 Click Score: 24    End of Session   Activity Tolerance: Patient tolerated treatment well Patient left: in chair;with call bell/phone within reach Nurse Communication: Mobility status      Time: 4627-0350 PT Time Calculation (min) (ACUTE ONLY): 22 min   Charges:   PT Evaluation $PT Eval Low Complexity: 1 Low          Arlyss Gandy, PT, DPT Acute Rehabilitation Pager: [phone]   Arlyss Gandy 05/28/2020, 12:14 PM

## 2020-05-29 ENCOUNTER — Encounter (HOSPITAL_COMMUNITY): Payer: Self-pay | Admitting: Internal Medicine

## 2020-05-29 ENCOUNTER — Encounter (HOSPITAL_COMMUNITY)
Admission: EM | Disposition: A | Discharge status: Home / Self Care | Payer: Self-pay | Source: Home / Self Care | Attending: Internal Medicine

## 2020-05-29 DIAGNOSIS — D571 Sickle-cell disease without crisis: Secondary | ICD-10-CM | POA: Diagnosis not present

## 2020-05-29 DIAGNOSIS — R17 Unspecified jaundice: Secondary | ICD-10-CM | POA: Diagnosis not present

## 2020-05-29 DIAGNOSIS — I50813 Acute on chronic right heart failure: Secondary | ICD-10-CM | POA: Diagnosis not present

## 2020-05-29 DIAGNOSIS — I272 Pulmonary hypertension, unspecified: Secondary | ICD-10-CM | POA: Diagnosis not present

## 2020-05-29 DIAGNOSIS — N179 Acute kidney failure, unspecified: Secondary | ICD-10-CM

## 2020-05-29 DIAGNOSIS — I50812 Chronic right heart failure: Secondary | ICD-10-CM | POA: Diagnosis not present

## 2020-05-29 DIAGNOSIS — N1832 Chronic kidney disease, stage 3b: Secondary | ICD-10-CM | POA: Diagnosis not present

## 2020-05-29 HISTORY — PX: RIGHT HEART CATH: CATH118263

## 2020-05-29 LAB — BASIC METABOLIC PANEL
Anion gap: 8 (ref 5–15)
Anion gap: 7 (ref 5–15)
BUN: 59 mg/dL — ABNORMAL HIGH (ref 6–20)
BUN: 60 mg/dL — ABNORMAL HIGH (ref 6–20)
CO2: 18 mmol/L — ABNORMAL LOW (ref 22–32)
CO2: 18 mmol/L — ABNORMAL LOW (ref 22–32)
Calcium: 8 mg/dL — ABNORMAL LOW (ref 8.9–10.3)
Calcium: 8 mg/dL — ABNORMAL LOW (ref 8.9–10.3)
Chloride: 104 mmol/L (ref 98–111)
Chloride: 105 mmol/L (ref 98–111)
Creatinine, Ser: 2.41 mg/dL — ABNORMAL HIGH (ref 0.44–1.00)
Creatinine, Ser: 2.44 mg/dL — ABNORMAL HIGH (ref 0.44–1.00)
GFR, Estimated: 27 mL/min — ABNORMAL LOW (ref >60)
GFR, Estimated: 26 mL/min — ABNORMAL LOW (ref >60)
Glucose, Bld: 139 mg/dL — ABNORMAL HIGH (ref 70–99)
Glucose, Bld: 168 mg/dL — ABNORMAL HIGH (ref 70–99)
Potassium: 5.4 mmol/L — ABNORMAL HIGH (ref 3.5–5.1)
Potassium: 5.2 mmol/L — ABNORMAL HIGH (ref 3.5–5.1)
Sodium: 130 mmol/L — ABNORMAL LOW (ref 135–145)
Sodium: 130 mmol/L — ABNORMAL LOW (ref 135–145)

## 2020-05-29 LAB — POCT I-STAT EG7
Acid-base deficit: 7 mmol/L — ABNORMAL HIGH (ref 0.0–2.0)
Acid-base deficit: 7 mmol/L — ABNORMAL HIGH (ref 0.0–2.0)
Acid-base deficit: 8 mmol/L — ABNORMAL HIGH (ref 0.0–2.0)
Bicarbonate: 18.2 mmol/L — ABNORMAL LOW (ref 20.0–28.0)
Bicarbonate: 18.6 mmol/L — ABNORMAL LOW (ref 20.0–28.0)
Bicarbonate: 17.2 mmol/L — ABNORMAL LOW (ref 20.0–28.0)
Calcium, Ion: 1.15 mmol/L (ref 1.15–1.40)
Calcium, Ion: 1.17 mmol/L (ref 1.15–1.40)
Calcium, Ion: 1.05 mmol/L — ABNORMAL LOW (ref 1.15–1.40)
HCT: 23 % — ABNORMAL LOW (ref 36.0–46.0)
HCT: 23 % — ABNORMAL LOW (ref 36.0–46.0)
HCT: 21 % — ABNORMAL LOW (ref 36.0–46.0)
Hemoglobin: 7.8 g/dL — ABNORMAL LOW (ref 12.0–15.0)
Hemoglobin: 7.8 g/dL — ABNORMAL LOW (ref 12.0–15.0)
Hemoglobin: 7.1 g/dL — ABNORMAL LOW (ref 12.0–15.0)
O2 Saturation: 66 %
O2 Saturation: 68 %
O2 Saturation: 73 %
Potassium: 5.3 mmol/L — ABNORMAL HIGH (ref 3.5–5.1)
Potassium: 5.3 mmol/L — ABNORMAL HIGH (ref 3.5–5.1)
Potassium: 4.8 mmol/L (ref 3.5–5.1)
Sodium: 137 mmol/L (ref 135–145)
Sodium: 138 mmol/L (ref 135–145)
Sodium: 141 mmol/L (ref 135–145)
TCO2: 19 mmol/L — ABNORMAL LOW (ref 22–32)
TCO2: 20 mmol/L — ABNORMAL LOW (ref 22–32)
TCO2: 18 mmol/L — ABNORMAL LOW (ref 22–32)
pCO2, Ven: 34.7 mmHg — ABNORMAL LOW (ref 44.0–60.0)
pCO2, Ven: 35 mmHg — ABNORMAL LOW (ref 44.0–60.0)
pCO2, Ven: 34.8 mmHg — ABNORMAL LOW (ref 44.0–60.0)
pH, Ven: 7.327 (ref 7.250–7.430)
pH, Ven: 7.332 (ref 7.250–7.430)
pH, Ven: 7.303 (ref 7.250–7.430)
pO2, Ven: 36 mmHg (ref 32.0–45.0)
pO2, Ven: 38 mmHg (ref 32.0–45.0)
pO2, Ven: 42 mmHg (ref 32.0–45.0)

## 2020-05-29 LAB — CBC
HCT: 18.6 % — ABNORMAL LOW (ref 36.0–46.0)
Hemoglobin: 6.8 g/dL — CL (ref 12.0–15.0)
MCH: 44.7 pg — ABNORMAL HIGH (ref 26.0–34.0)
MCHC: 36.6 g/dL — ABNORMAL HIGH (ref 30.0–36.0)
MCV: 122.4 fL — ABNORMAL HIGH (ref 80.0–100.0)
Platelets: 183 10*3/uL (ref 150–400)
RBC: 1.52 MIL/uL — ABNORMAL LOW (ref 3.87–5.11)
RDW: 16.9 % — ABNORMAL HIGH (ref 11.5–15.5)
WBC: 13.8 10*3/uL — ABNORMAL HIGH (ref 4.0–10.5)
nRBC: 5.1 % — ABNORMAL HIGH (ref 0.0–0.2)

## 2020-05-29 LAB — HEPATIC FUNCTION PANEL
ALT: 82 U/L — ABNORMAL HIGH (ref 0–44)
AST: 96 U/L — ABNORMAL HIGH (ref 15–41)
Albumin: 2.7 g/dL — ABNORMAL LOW (ref 3.5–5.0)
Alkaline Phosphatase: 106 U/L (ref 38–126)
Bilirubin, Direct: 2.8 mg/dL — ABNORMAL HIGH (ref 0.0–0.2)
Indirect Bilirubin: 3.6 mg/dL — ABNORMAL HIGH (ref 0.3–0.9)
Total Bilirubin: 6.4 mg/dL — ABNORMAL HIGH (ref 0.3–1.2)
Total Protein: 8.8 g/dL — ABNORMAL HIGH (ref 6.5–8.1)

## 2020-05-29 LAB — T3: T3, Total: 62 ng/dL — ABNORMAL LOW (ref 71–180)

## 2020-05-29 LAB — HEMOGLOBIN A1C
Hgb A1c MFr Bld: 5.2 % (ref 4.8–5.6)
Mean Plasma Glucose: 103 mg/dL

## 2020-05-29 LAB — GLUCOSE, CAPILLARY
Glucose-Capillary: 137 mg/dL — ABNORMAL HIGH (ref 70–99)
Glucose-Capillary: 139 mg/dL — ABNORMAL HIGH (ref 70–99)
Glucose-Capillary: 143 mg/dL — ABNORMAL HIGH (ref 70–99)
Glucose-Capillary: 144 mg/dL — ABNORMAL HIGH (ref 70–99)
Glucose-Capillary: 151 mg/dL — ABNORMAL HIGH (ref 70–99)
Glucose-Capillary: 77 mg/dL (ref 70–99)
Glucose-Capillary: 88 mg/dL (ref 70–99)

## 2020-05-29 LAB — CREATININE, SERUM
Creatinine, Ser: 2.49 mg/dL — ABNORMAL HIGH (ref 0.44–1.00)
GFR, Estimated: 26 mL/min — ABNORMAL LOW (ref >60)

## 2020-05-29 LAB — BRAIN NATRIURETIC PEPTIDE: B Natriuretic Peptide: 3195.8 pg/mL — ABNORMAL HIGH (ref 0.0–100.0)

## 2020-05-29 SURGERY — RIGHT HEART CATH
Anesthesia: LOCAL

## 2020-05-29 MED ORDER — FENTANYL CITRATE (PF) 100 MCG/2ML IJ SOLN
INTRAMUSCULAR | Status: AC
  Filled 2020-05-29: qty 2

## 2020-05-29 MED ORDER — SODIUM CHLORIDE 0.9 % IV SOLN: INTRAVENOUS | Status: DC

## 2020-05-29 MED ORDER — HEPARIN (PORCINE) IN NACL 1000-0.9 UT/500ML-% IV SOLN
INTRAVENOUS | Status: DC | PRN
  Administered 2020-05-29: 500 mL

## 2020-05-29 MED ORDER — ASPIRIN 81 MG PO CHEW: 81.0000 mg | CHEWABLE_TABLET | ORAL | Status: DC

## 2020-05-29 MED ORDER — HEPARIN (PORCINE) IN NACL 1000-0.9 UT/500ML-% IV SOLN
INTRAVENOUS | Status: AC
  Filled 2020-05-29: qty 500

## 2020-05-29 MED ORDER — ONDANSETRON HCL 4 MG/2ML IJ SOLN
4.0000 mg | Freq: Four times a day (QID) | INTRAMUSCULAR | Status: DC | PRN
  Administered 2020-05-31: 4 mg via INTRAVENOUS

## 2020-05-29 MED ORDER — HYDRALAZINE HCL 20 MG/ML IJ SOLN: 10.0000 mg | INTRAMUSCULAR | Status: DC | PRN

## 2020-05-29 MED ORDER — ASPIRIN 81 MG PO CHEW
81.0000 mg | CHEWABLE_TABLET | ORAL | Status: AC
  Administered 2020-05-29: 81 mg via ORAL
  Filled 2020-05-29: qty 1

## 2020-05-29 MED ORDER — MIDAZOLAM HCL 2 MG/2ML IJ SOLN
INTRAMUSCULAR | Status: DC | PRN
  Administered 2020-05-29 (×2): 0.5 mg via INTRAVENOUS

## 2020-05-29 MED ORDER — ENOXAPARIN SODIUM 30 MG/0.3ML ~~LOC~~ SOLN: 30.0000 mg | Freq: Every day | SUBCUTANEOUS | Status: DC

## 2020-05-29 MED ORDER — SODIUM CHLORIDE 0.9% FLUSH
3.0000 mL | Freq: Two times a day (BID) | INTRAVENOUS | Status: DC
  Administered 2020-05-30: 3 mL via INTRAVENOUS

## 2020-05-29 MED ORDER — LIDOCAINE HCL (PF) 1 % IJ SOLN
INTRAMUSCULAR | Status: DC | PRN
  Administered 2020-05-29: 2 mL

## 2020-05-29 MED ORDER — SODIUM CHLORIDE 0.9 % IV SOLN: 250.0000 mL | INTRAVENOUS | Status: DC | PRN

## 2020-05-29 MED ORDER — MIDAZOLAM HCL 2 MG/2ML IJ SOLN
INTRAMUSCULAR | Status: AC
  Filled 2020-05-29: qty 2

## 2020-05-29 MED ORDER — ENOXAPARIN SODIUM 30 MG/0.3ML ~~LOC~~ SOLN
30.0000 mg | SUBCUTANEOUS | Status: DC
  Administered 2020-05-30 – 2020-06-02 (×4): 30 mg via SUBCUTANEOUS
  Filled 2020-05-29 (×4): qty 0.3

## 2020-05-29 MED ORDER — SODIUM CHLORIDE 0.9% FLUSH
3.0000 mL | Freq: Two times a day (BID) | INTRAVENOUS | Status: DC
  Administered 2020-05-29: 3 mL via INTRAVENOUS

## 2020-05-29 MED ORDER — LIDOCAINE HCL (PF) 1 % IJ SOLN
INTRAMUSCULAR | Status: AC
  Filled 2020-05-29: qty 30

## 2020-05-29 MED ORDER — SODIUM CHLORIDE 0.9% FLUSH: 3.0000 mL | INTRAVENOUS | Status: DC | PRN

## 2020-05-29 MED ORDER — FENTANYL CITRATE (PF) 100 MCG/2ML IJ SOLN
INTRAMUSCULAR | Status: DC | PRN
  Administered 2020-05-29 (×2): 12.5 ug via INTRAVENOUS

## 2020-05-29 MED ORDER — LABETALOL HCL 5 MG/ML IV SOLN: 10.0000 mg | INTRAVENOUS | Status: DC | PRN

## 2020-05-29 MED ORDER — ACETAMINOPHEN 325 MG PO TABS
650.0000 mg | ORAL_TABLET | ORAL | Status: DC | PRN
  Administered 2020-05-30 – 2020-06-06 (×7): 650 mg via ORAL
  Filled 2020-05-29 (×7): qty 2

## 2020-05-29 SURGICAL SUPPLY — 7 items
CATH SWAN GANZ 7F STRAIGHT (CATHETERS) ×2 IMPLANT
KIT MICROPUNCTURE NIT STIFF (SHEATH) ×2 IMPLANT
PACK CARDIAC CATHETERIZATION (CUSTOM PROCEDURE TRAY) ×2 IMPLANT
PROTECTION STATION PRESSURIZED (MISCELLANEOUS) ×2
SHEATH PINNACLE 7F 10CM (SHEATH) ×2 IMPLANT
STATION PROTECTION PRESSURIZED (MISCELLANEOUS) ×1 IMPLANT
TRANSDUCER W/STOPCOCK (MISCELLANEOUS) ×2 IMPLANT

## 2020-05-29 NOTE — H&P (View-Only) (Signed)
Progress Note  Patient Name: Pamela Hudson Date of Encounter: 05/29/2020  Primary Cardiologist: No primary care provider on file.  [facility]; Otherwise can see either   Subjective   Patient notes some diuresis, with some decrease in leg swelling abdominal swelling.  Feels slightly better but not back to normal.  Has not gotten up to walk yet.  Inpatient Medications    Scheduled Meds: . Chlorhexidine Gluconate Cloth  6 each Topical Daily  . enoxaparin (LOVENOX) injection  40 mg Subcutaneous Daily  . feeding supplement  237 mL Oral BID BM  . furosemide  80 mg Intravenous BID  . hydroxyurea  1,000 mg Oral Daily  . insulin aspart  0-9 Units Subcutaneous Q4H  . insulin glargine  25 Units Subcutaneous QHS  . multivitamin with minerals  1 tablet Oral Daily  . sodium chloride flush  10-40 mL Intracatheter Q12H  . sodium chloride flush  3 mL Intravenous Q12H   Continuous Infusions: . sodium chloride     PRN Meds: sodium chloride, guaiFENesin-dextromethorphan, ondansetron (ZOFRAN) IV, sodium chloride flush, sodium chloride flush   Vital Signs    Vitals:   05/28/20 2022 05/29/20 0015 05/29/20 0444 05/29/20 0500  BP: (!) 140/51 (!) 103/52 (!) 94/52   Pulse: (!) 123 (!) 122 (!) 108   Resp: (!) 23 20 20    Temp: 100.2 F (37.9 C) (!) 100.8 F (38.2 C) 99.2 F (37.3 C)   TempSrc: Oral Oral Oral   SpO2: 99% 95% 98%   Weight:  63.4 kg  63.4 kg  Height:        Intake/Output Summary (Last 24 hours) at 05/29/2020 1004 Last data filed at 05/29/2020 0136 Gross per 24 hour  Intake 897 ml  Output 1000 ml  Net -103 ml   Filed Weights   05/28/20 0416 05/29/20 0015 05/29/20 0500  Weight: 62.9 kg 63.4 kg 63.4 kg    Telemetry    Likely sinus tachycardia- Personally Reviewed  ECG    No new - Personally Reviewed  Physical Exam   GEN: No acute distress.   Neck:  JVD with prominent V wave Cardiac: regular tachycardia, no murmurs, rubs, or gallops.   Respiratory: Clear to auscultation bilaterally with tachypnea GI: Soft, nontenderly distended with fluid wave and hernia MS: Non pitting edema; No deformity. Neuro:  Nonfocal  Psych: Normal affect   Labs    Chemistry Recent Labs  Lab 05/27/20 1700 05/28/20 0221 05/29/20 0615  NA 132* 132* 130*  K 5.1 4.9 5.4*  CL 107 107 104  CO2 15* 17* 18*  GLUCOSE 202* 170* 139*  BUN 55* 50* 59*  CREATININE 1.49* 1.63* 2.41*  CALCIUM 8.5* 8.5* 8.0*  PROT 9.4* 9.5*  --   ALBUMIN 3.2* 2.8*  --   AST 98* 89*  --   ALT 88* 85*  --   ALKPHOS 118 106  --   BILITOT 5.0* 5.6*  --   GFRNONAA 48* 43* 27*  ANIONGAP 10 8 8      Hematology Recent Labs  Lab 05/27/20 1604 05/28/20 0221  WBC 12.8* 12.5*  RBC 1.71*  1.68* 1.62*  HGB 7.8* 7.4*  HCT 20.9* 19.9*  MCV 122.2* 122.8*  MCH 45.6* 45.7*  MCHC 37.3* 37.2*  RDW 17.4* 17.2*  PLT 222 194    Cardiac EnzymesNo results for input(s): TROPONINI in the last 168 hours. No results for input(s): TROPIPOC in the last 168 hours.   BNP Recent Labs  Lab 05/27/20  1733 05/29/20 0615  BNP 1,228.5* 3,195.8*     DDimer No results for input(s): DDIMER in the last 168 hours.   Radiology    US Abdomen Complete  Result Date: 05/28/2020 CLINICAL DATA:  Elevated liver function tests, chronic renal insufficiency EXAM: ABDOMEN ULTRASOUND COMPLETE COMPARISON:  04/17/2020 FINDINGS: Gallbladder: Surgically absent Common bile duct: Diameter: 4 mm Liver: Stable heterogeneous increased liver echotexture most consistent with hepatic steatosis. No focal liver abnormality. No intrahepatic duct dilation. Portal vein is patent on color Doppler imaging with normal direction of blood flow towards the liver. IVC: No abnormality visualized. Pancreas: Visualized portion unremarkable. Spleen: Suboptimally visualized due to splenic atrophy noted on previous CT exams. Right Kidney: Length: 11.7 cm. Echogenicity within normal limits. No mass or hydronephrosis visualized.  Left Kidney: Length: 10.3 cm. Echogenicity within normal limits. No mass or hydronephrosis visualized. Abdominal aorta: No aneurysm visualized. Other findings: None. IMPRESSION: 1. Increased liver echotexture compatible with hepatic steatosis. 2. Status post cholecystectomy. 3. 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 Electronically Signed   By: Sharlet Salina M.D.   On: 05/28/2020 02:18   DG Chest Portable 1 View  Result Date: 05/27/2020 CLINICAL DATA:  Weakness and shortness of breath. EXAM: PORTABLE CHEST 1 VIEW COMPARISON:  08/30/2019 FINDINGS: Right chest wall port a catheter is noted with tip in the SVC. Cardiac enlargement, unchanged. New right pleural effusion with overlying subsegmental atelectasis. Pulmonary vascular congestion. The visualized osseous structures are unremarkable. IMPRESSION: 1. Cardiac enlargement, pulmonary vascular congestion, and new right pleural effusion with overlying subsegmental atelectasis. Findings concerning for CHF. Electronically Signed   By: Signa Kell M.D.   On: 05/27/2020 17:04   ECHOCARDIOGRAM COMPLETE  Result Date: 05/28/2020    ECHOCARDIOGRAM REPORT   Patient Name:   Pamela Hudson Date of Exam: 05/28/2020 Medical Rec #:  [id number]     Height:       59.0 in Accession #:    [id number]    Weight:       138.7 lb Date of Birth:  09/02/1985     BSA:          1.579 m Patient Age:    34 years      BP:           101/64 mmHg Patient Gender: F             HR:           108 bpm. Exam Location:  Inpatient Procedure: 2D Echo, 3D Echo, Cardiac Doppler and Color Doppler Indications:    I51.7 Cardiomegaly  History:        Patient has prior history of Echocardiogram examinations, most                 recent 08/31/2018. Abnormal ECG, Stroke and Pulmonary HTN; Risk                 Factors:Hypertension and Diabetes. Sickle cell.  Sonographer:    Sheralyn Boatman RDCS Referring Phys: [id number] ANASTASSIA DOUTOVA IMPRESSIONS  1. There is  severe, torrential tricuspid regurgitation. The leaflets are fixed and appear immobile. Leaflet immobility is out of proportion to annular dilation. Would consider primary leaflet pathology such as carcinoid involvement in the work-up in addition to the patient's long history of severe pulmonary hypertension. The tricuspid valve is abnormal. Tricuspid valve regurgitation is severe.  2. RVSP is severely underestimated in the setting of severe tricuspid regurgitation. Right ventricular systolic function is  severely reduced. The right ventricular size is severely enlarged. There is normal pulmonary artery systolic pressure. The estimated right ventricular systolic pressure is 34.5 mmHg.  3. Left ventricular ejection fraction, by estimation, is 45 to 50%. Left ventricular ejection fraction by 3D volume is 45 %. The left ventricle has mildly decreased function. The left ventricle demonstrates global hypokinesis. Indeterminate diastolic filling due to E-A fusion. There is the interventricular septum is flattened in systole and diastole, consistent with right ventricular pressure and volume overload.  4. Right atrial size was severely dilated.  5. A small pericardial effusion is present. The pericardial effusion is circumferential.  6. The mitral valve is grossly normal. Mild to moderate mitral valve regurgitation. No evidence of mitral stenosis.  7. The aortic valve is tricuspid. Aortic valve regurgitation is not visualized. No aortic stenosis is present.  8. The inferior vena cava is dilated in size with <50% respiratory variability, suggesting right atrial pressure of 15 mmHg. Comparison(s): Changes from prior study are noted. RV is severely dilated with severely reduced function. Severe torrential TR is present. LVEF is mildly reduced 45-50%. RVSP underestimated in setting of severe TR. FINDINGS  Left Ventricle: Left ventricular ejection fraction, by estimation, is 45 to 50%. Left ventricular ejection fraction by 3D  volume is 45 %. The left ventricle has mildly decreased function. The left ventricle demonstrates global hypokinesis. The left ventricular internal cavity size was normal in size. There is no left ventricular hypertrophy. The interventricular septum is flattened in systole and diastole, consistent with right ventricular pressure and volume overload. Indeterminate diastolic filling due to E-A fusion. Right Ventricle: RVSP is severely underestimated in the setting of severe tricuspid regurgitation. The right ventricular size is severely enlarged. No increase in right ventricular wall thickness. Right ventricular systolic function is severely reduced. There is normal pulmonary artery systolic pressure. The tricuspid regurgitant velocity is 2.21 m/s, and with an assumed right atrial pressure of 15 mmHg, the estimated right ventricular systolic pressure is 34.5 mmHg. Left Atrium: Left atrial size was normal in size. Right Atrium: Right atrial size was severely dilated. Pericardium: A small pericardial effusion is present. The pericardial effusion is circumferential. Mitral Valve: The mitral valve is grossly normal. Mild to moderate mitral valve regurgitation. No evidence of mitral valve stenosis. MV peak gradient, 14.0 mmHg. The mean mitral valve gradient is 4.0 mmHg. Tricuspid Valve: There is severe, torrential tricuspid regurgitation. The leaflets are fixed and appear immobile. Leaflet immobility is out of proportion to annular dilation. Would consider primary leaflet pathology such as carcinoid involvement in the work-up in addition to the patient's long history of severe pulmonary hypertension. The tricuspid valve is abnormal. Tricuspid valve regurgitation is severe. No evidence of tricuspid stenosis. The flow in the hepatic veins is reversed during ventricular systole. Aortic Valve: The aortic valve is tricuspid. Aortic valve regurgitation is not visualized. No aortic stenosis is present. Pulmonic Valve: The  pulmonic valve was grossly normal. Pulmonic valve regurgitation is mild. No evidence of pulmonic stenosis. Aorta: The aortic root and ascending aorta are structurally normal, with no evidence of dilitation. Venous: The inferior vena cava is dilated in size with less than 50% respiratory variability, suggesting right atrial pressure of 15 mmHg. IAS/Shunts: There is left bowing of the interatrial septum, suggestive of elevated right atrial pressure. The atrial septum is grossly normal.  LEFT VENTRICLE PLAX 2D LVIDd:         4.80 cm         Diastology LVIDs:  3.60 cm         LV e' medial:    9.25 cm/s LV PW:         1.15 cm         LV E/e' medial:  14.9 LV IVS:        0.90 cm         LV e' lateral:   11.40 cm/s LVOT diam:     1.80 cm         LV E/e' lateral: 12.1 LV SV:         56 LV SV Index:   36 LVOT Area:     2.54 cm        3D Volume EF                                LV 3D EF:    Left                                             ventricular LV Volumes (MOD)                            ejection LV vol d, MOD    77.2 ml                    fraction by A2C:                                        3D volume LV vol d, MOD    98.4 ml                    is 45 %. A4C: LV vol s, MOD    36.1 ml A2C:                           3D Volume EF: LV vol s, MOD    59.1 ml       3D EF:        45 % A4C: LV SV MOD A2C:   41.1 ml LV SV MOD A4C:   98.4 ml LV SV MOD BP:    43.2 ml RIGHT VENTRICLE             IVC RV S prime:     10.90 cm/s  IVC diam: 3.00 cm TAPSE (M-mode): 1.9 cm LEFT ATRIUM           Index       RIGHT ATRIUM           Index LA diam:      4.00 cm 2.53 cm/m  RA Area:     25.80 cm LA Vol (A2C): 56.3 ml 35.66 ml/m RA Volume:   102.00 ml 64.61 ml/m LA Vol (A4C): 39.5 ml 25.02 ml/m  AORTIC VALVE              PULMONIC VALVE LVOT Vmax:   134.00 cm/s  PR End Diast Vel: 2.37 msec LVOT Vmean:  104.000 cm/s LVOT VTI:    0.222 m  AORTA Ao Root diam: 2.70 cm Ao Asc diam:  3.30 cm MITRAL VALVE  TRICUSPID VALVE MV  Area (PHT): 5.84 cm      TR Peak grad:   19.5 mmHg MV Area VTI:   1.70 cm      TR Vmax:        221.00 cm/s MV Peak grad:  14.0 mmHg MV Mean grad:  4.0 mmHg      SHUNTS MV Vmax:       1.87 m/s      Systemic VTI:  0.22 m MV Vmean:      81.7 cm/s     Systemic Diam: 1.80 cm MV Decel Time: 130 msec MR Peak grad:    83.7 mmHg MR Mean grad:    56.0 mmHg MR Vmax:         457.50 cm/s MR Vmean:        356.5 cm/s MR PISA:         1.01 cm MR PISA Eff ROA: 9 mm MR PISA Radius:  0.40 cm MV E velocity: 138.00 cm/s Lennie Odor MD Electronically signed by Lennie Odor MD Signature Date/Time: 05/28/2020/2:05:27 PM    Final     Cardiac Studies   Very Severe TR with RV failure  Patient Profile     35 y.o. 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 with OSA, Mild LV dysfunction - Diuretic regimen: holding present lasix; will likely need lasix drip but will defer until RHC later today - Fluid restriction of < 2 L  - :  unable to presently - No signs or symptoms of connective tissue disease and no family history of the CT or PH  - No weight loss drug use  - No methamphetamine or other illicit substance use - No history of DVT or PE to suggest chronic thromboembolic disease; VQ Scan not appropriate at this time - Last RHC:2020 - Pulm/AHF: WF- HP presently - PDEi: future - NO or equivalent: future - ERA: future - Made NPO - Risks and benefits of cardiac catheterization have been discussed with the patient.  These include bleeding, infection, kidney damage, stroke, heart attack, death.  The patient understands these risks and is willing to proceed.   AKI on CKD Significant increase in creatinine; this is more concerning for  decompensation of her disease with elevation in her BNP  SVT  - presently SR - no AV nodal agents given RV dysfunction  DM - last Lantus at night; will monitor sugar but not other insulin changes made  Addendum: Repeat BMP for hyperkalemia LFTs added on in case needs ERA; and given bilirubinemia from mixed picture (congestiion favorer over sickle crisis)  For questions or updates, please contact [facility] Please consult [email or web address] for contact info under Cardiology/STEMI.      Riley Lam, MD Cardiologist [facility]  [address] [place], [address] [place], [place] [address] [phone]  10:32 AM

## 2020-05-29 NOTE — Progress Notes (Addendum)
Progress Note  Patient Name: Pamela GongDevon Hudson Date of Encounter: 05/29/2020  Primary Cardiologist: No primary care provider on file.  [facility]; Otherwise can see either   Subjective   Patient notes some diuresis, with some decrease in leg swelling abdominal swelling.  Feels slightly better but not back to normal.  Has not gotten up to walk yet.  Inpatient Medications    Scheduled Meds: . Chlorhexidine Gluconate Cloth  6 each Topical Daily  . enoxaparin (LOVENOX) injection  40 mg Subcutaneous Daily  . feeding supplement  237 mL Oral BID BM  . furosemide  80 mg Intravenous BID  . hydroxyurea  1,000 mg Oral Daily  . insulin aspart  0-9 Units Subcutaneous Q4H  . insulin glargine  25 Units Subcutaneous QHS  . multivitamin with minerals  1 tablet Oral Daily  . sodium chloride flush  10-40 mL Intracatheter Q12H  . sodium chloride flush  3 mL Intravenous Q12H   Continuous Infusions: . sodium chloride     PRN Meds: sodium chloride, guaiFENesin-dextromethorphan, ondansetron (ZOFRAN) IV, sodium chloride flush, sodium chloride flush   Vital Signs    Vitals:   05/28/20 2022 05/29/20 0015 05/29/20 0444 05/29/20 0500  BP: (!) 140/51 (!) 103/52 (!) 94/52   Pulse: (!) 123 (!) 122 (!) 108   Resp: (!) 23 20 20    Temp: 100.2 F (37.9 C) (!) 100.8 F (38.2 C) 99.2 F (37.3 C)   TempSrc: Oral Oral Oral   SpO2: 99% 95% 98%   Weight:  63.4 kg  63.4 kg  Height:        Intake/Output Summary (Last 24 hours) at 05/29/2020 1004 Last data filed at 05/29/2020 0136 Gross per 24 hour  Intake 897 ml  Output 1000 ml  Net -103 ml   Filed Weights   05/28/20 0416 05/29/20 0015 05/29/20 0500  Weight: 62.9 kg 63.4 kg 63.4 kg    Telemetry    Likely sinus tachycardia- Personally Reviewed  ECG    No new - Personally Reviewed  Physical Exam   GEN: No acute distress.   Neck:  JVD with prominent V wave Cardiac: regular tachycardia, no murmurs, rubs, or gallops.   Respiratory: Clear to auscultation bilaterally with tachypnea GI: Soft, nontenderly distended with fluid wave and hernia MS: Non pitting edema; No deformity. Neuro:  Nonfocal  Psych: Normal affect   Labs    Chemistry Recent Labs  Lab 05/27/20 1700 05/28/20 0221 05/29/20 0615  NA 132* 132* 130*  K 5.1 4.9 5.4*  CL 107 107 104  CO2 15* 17* 18*  GLUCOSE 202* 170* 139*  BUN 55* 50* 59*  CREATININE 1.49* 1.63* 2.41*  CALCIUM 8.5* 8.5* 8.0*  PROT 9.4* 9.5*  --   ALBUMIN 3.2* 2.8*  --   AST 98* 89*  --   ALT 88* 85*  --   ALKPHOS 118 106  --   BILITOT 5.0* 5.6*  --   GFRNONAA 48* 43* 27*  ANIONGAP 10 8 8      Hematology Recent Labs  Lab 05/27/20 1604 05/28/20 0221  WBC 12.8* 12.5*  RBC 1.71*  1.68* 1.62*  HGB 7.8* 7.4*  HCT 20.9* 19.9*  MCV 122.2* 122.8*  MCH 45.6* 45.7*  MCHC 37.3* 37.2*  RDW 17.4* 17.2*  PLT 222 194    Cardiac EnzymesNo results for input(s): TROPONINI in the last 168 hours. No results for input(s): TROPIPOC in the last 168 hours.   BNP Recent Labs  Lab 05/27/20  1733 05/29/20 0615  BNP 1,228.5* 3,195.8*     DDimer No results for input(s): DDIMER in the last 168 hours.   Radiology    US Abdomen Complete  Result Date: 05/28/2020 CLINICAL DATA:  Elevated liver function tests, chronic renal insufficiency EXAM: ABDOMEN ULTRASOUND COMPLETE COMPARISON:  04/17/2020 FINDINGS: Gallbladder: Surgically absent Common bile duct: Diameter: 4 mm Liver: Stable heterogeneous increased liver echotexture most consistent with hepatic steatosis. No focal liver abnormality. No intrahepatic duct dilation. Portal vein is patent on color Doppler imaging with normal direction of blood flow towards the liver. IVC: No abnormality visualized. Pancreas: Visualized portion unremarkable. Spleen: Suboptimally visualized due to splenic atrophy noted on previous CT exams. Right Kidney: Length: 11.7 cm. Echogenicity within normal limits. No mass or hydronephrosis visualized.  Left Kidney: Length: 10.3 cm. Echogenicity within normal limits. No mass or hydronephrosis visualized. Abdominal aorta: No aneurysm visualized. Other findings: None. IMPRESSION: 1. Increased liver echotexture compatible with hepatic steatosis. 2. Status post cholecystectomy. 3. 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 Electronically Signed   By: Sharlet Salina M.D.   On: 05/28/2020 02:18   DG Chest Portable 1 View  Result Date: 05/27/2020 CLINICAL DATA:  Weakness and shortness of breath. EXAM: PORTABLE CHEST 1 VIEW COMPARISON:  08/30/2019 FINDINGS: Right chest wall port a catheter is noted with tip in the SVC. Cardiac enlargement, unchanged. New right pleural effusion with overlying subsegmental atelectasis. Pulmonary vascular congestion. The visualized osseous structures are unremarkable. IMPRESSION: 1. Cardiac enlargement, pulmonary vascular congestion, and new right pleural effusion with overlying subsegmental atelectasis. Findings concerning for CHF. Electronically Signed   By: Signa Kell M.D.   On: 05/27/2020 17:04   ECHOCARDIOGRAM COMPLETE  Result Date: 05/28/2020    ECHOCARDIOGRAM REPORT   Patient Name:   Pamela Hudson Date of Exam: 05/28/2020 Medical Rec #:  [id number]     Height:       59.0 in Accession #:    [id number]    Weight:       138.7 lb Date of Birth:  09/02/1985     BSA:          1.579 m Patient Age:    34 years      BP:           101/64 mmHg Patient Gender: F             HR:           108 bpm. Exam Location:  Inpatient Procedure: 2D Echo, 3D Echo, Cardiac Doppler and Color Doppler Indications:    I51.7 Cardiomegaly  History:        Patient has prior history of Echocardiogram examinations, most                 recent 08/31/2018. Abnormal ECG, Stroke and Pulmonary HTN; Risk                 Factors:Hypertension and Diabetes. Sickle cell.  Sonographer:    Sheralyn Boatman RDCS Referring Phys: [id number] ANASTASSIA DOUTOVA IMPRESSIONS  1. There is  severe, torrential tricuspid regurgitation. The leaflets are fixed and appear immobile. Leaflet immobility is out of proportion to annular dilation. Would consider primary leaflet pathology such as carcinoid involvement in the work-up in addition to the patient's long history of severe pulmonary hypertension. The tricuspid valve is abnormal. Tricuspid valve regurgitation is severe.  2. RVSP is severely underestimated in the setting of severe tricuspid regurgitation. Right ventricular systolic function is  severely reduced. The right ventricular size is severely enlarged. There is normal pulmonary artery systolic pressure. The estimated right ventricular systolic pressure is 34.5 mmHg.  3. Left ventricular ejection fraction, by estimation, is 45 to 50%. Left ventricular ejection fraction by 3D volume is 45 %. The left ventricle has mildly decreased function. The left ventricle demonstrates global hypokinesis. Indeterminate diastolic filling due to E-A fusion. There is the interventricular septum is flattened in systole and diastole, consistent with right ventricular pressure and volume overload.  4. Right atrial size was severely dilated.  5. A small pericardial effusion is present. The pericardial effusion is circumferential.  6. The mitral valve is grossly normal. Mild to moderate mitral valve regurgitation. No evidence of mitral stenosis.  7. The aortic valve is tricuspid. Aortic valve regurgitation is not visualized. No aortic stenosis is present.  8. The inferior vena cava is dilated in size with <50% respiratory variability, suggesting right atrial pressure of 15 mmHg. Comparison(s): Changes from prior study are noted. RV is severely dilated with severely reduced function. Severe torrential TR is present. LVEF is mildly reduced 45-50%. RVSP underestimated in setting of severe TR. FINDINGS  Left Ventricle: Left ventricular ejection fraction, by estimation, is 45 to 50%. Left ventricular ejection fraction by 3D  volume is 45 %. The left ventricle has mildly decreased function. The left ventricle demonstrates global hypokinesis. The left ventricular internal cavity size was normal in size. There is no left ventricular hypertrophy. The interventricular septum is flattened in systole and diastole, consistent with right ventricular pressure and volume overload. Indeterminate diastolic filling due to E-A fusion. Right Ventricle: RVSP is severely underestimated in the setting of severe tricuspid regurgitation. The right ventricular size is severely enlarged. No increase in right ventricular wall thickness. Right ventricular systolic function is severely reduced. There is normal pulmonary artery systolic pressure. The tricuspid regurgitant velocity is 2.21 m/s, and with an assumed right atrial pressure of 15 mmHg, the estimated right ventricular systolic pressure is 34.5 mmHg. Left Atrium: Left atrial size was normal in size. Right Atrium: Right atrial size was severely dilated. Pericardium: A small pericardial effusion is present. The pericardial effusion is circumferential. Mitral Valve: The mitral valve is grossly normal. Mild to moderate mitral valve regurgitation. No evidence of mitral valve stenosis. MV peak gradient, 14.0 mmHg. The mean mitral valve gradient is 4.0 mmHg. Tricuspid Valve: There is severe, torrential tricuspid regurgitation. The leaflets are fixed and appear immobile. Leaflet immobility is out of proportion to annular dilation. Would consider primary leaflet pathology such as carcinoid involvement in the work-up in addition to the patient's long history of severe pulmonary hypertension. The tricuspid valve is abnormal. Tricuspid valve regurgitation is severe. No evidence of tricuspid stenosis. The flow in the hepatic veins is reversed during ventricular systole. Aortic Valve: The aortic valve is tricuspid. Aortic valve regurgitation is not visualized. No aortic stenosis is present. Pulmonic Valve: The  pulmonic valve was grossly normal. Pulmonic valve regurgitation is mild. No evidence of pulmonic stenosis. Aorta: The aortic root and ascending aorta are structurally normal, with no evidence of dilitation. Venous: The inferior vena cava is dilated in size with less than 50% respiratory variability, suggesting right atrial pressure of 15 mmHg. IAS/Shunts: There is left bowing of the interatrial septum, suggestive of elevated right atrial pressure. The atrial septum is grossly normal.  LEFT VENTRICLE PLAX 2D LVIDd:         4.80 cm         Diastology LVIDs:  3.60 cm         LV e' medial:    9.25 cm/s LV PW:         1.15 cm         LV E/e' medial:  14.9 LV IVS:        0.90 cm         LV e' lateral:   11.40 cm/s LVOT diam:     1.80 cm         LV E/e' lateral: 12.1 LV SV:         56 LV SV Index:   36 LVOT Area:     2.54 cm        3D Volume EF                                LV 3D EF:    Left                                             ventricular LV Volumes (MOD)                            ejection LV vol d, MOD    77.2 ml                    fraction by A2C:                                        3D volume LV vol d, MOD    98.4 ml                    is 45 %. A4C: LV vol s, MOD    36.1 ml A2C:                           3D Volume EF: LV vol s, MOD    59.1 ml       3D EF:        45 % A4C: LV SV MOD A2C:   41.1 ml LV SV MOD A4C:   98.4 ml LV SV MOD BP:    43.2 ml RIGHT VENTRICLE             IVC RV S prime:     10.90 cm/s  IVC diam: 3.00 cm TAPSE (M-mode): 1.9 cm LEFT ATRIUM           Index       RIGHT ATRIUM           Index LA diam:      4.00 cm 2.53 cm/m  RA Area:     25.80 cm LA Vol (A2C): 56.3 ml 35.66 ml/m RA Volume:   102.00 ml 64.61 ml/m LA Vol (A4C): 39.5 ml 25.02 ml/m  AORTIC VALVE              PULMONIC VALVE LVOT Vmax:   134.00 cm/s  PR End Diast Vel: 2.37 msec LVOT Vmean:  104.000 cm/s LVOT VTI:    0.222 m  AORTA Ao Root diam: 2.70 cm Ao Asc diam:  3.30 cm MITRAL VALVE  TRICUSPID VALVE MV  Area (PHT): 5.84 cm      TR Peak grad:   19.5 mmHg MV Area VTI:   1.70 cm      TR Vmax:        221.00 cm/s MV Peak grad:  14.0 mmHg MV Mean grad:  4.0 mmHg      SHUNTS MV Vmax:       1.87 m/s      Systemic VTI:  0.22 m MV Vmean:      81.7 cm/s     Systemic Diam: 1.80 cm MV Decel Time: 130 msec MR Peak grad:    83.7 mmHg MR Mean grad:    56.0 mmHg MR Vmax:         457.50 cm/s MR Vmean:        356.5 cm/s MR PISA:         1.01 cm MR PISA Eff ROA: 9 mm MR PISA Radius:  0.40 cm MV E velocity: 138.00 cm/s [place] O'Neal MD Electronically signed by [place] O'Neal MD Signature Date/Time: 05/28/2020/2:05:27 PM    Final     Cardiac Studies   Very Severe TR with RV failure  Patient Profile     34 y.o. 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 with OSA, Mild LV dysfunction - Diuretic regimen: holding present lasix; will likely need lasix drip but will defer until RHC later today - Fluid restriction of < 2 L  - 6MWD:  unable to presently - No signs or symptoms of connective tissue disease and no family history of the CT or PH  - No weight loss drug use  - No methamphetamine or other illicit substance use - No history of DVT or PE to suggest chronic thromboembolic disease; VQ Scan not appropriate at this time - Last RHC:2020 - Pulm/AHF: WF- HP presently - PDEi: future - NO or equivalent: future - ERA: future - Made NPO - Risks and benefits of cardiac catheterization have been discussed with the patient.  These include bleeding, infection, kidney damage, stroke, heart attack, death.  The patient understands these risks and is willing to proceed.   AKI on CKD Significant increase in creatinine; this is more concerning for  decompensation of her disease with elevation in her BNP  SVT  - presently SR - no AV nodal agents given RV dysfunction  DM - last Lantus at night; will monitor sugar but not other insulin changes made  Addendum: Repeat BMP for hyperkalemia LFTs added on in case needs ERA; and given bilirubinemia from mixed picture (congestiion favorer over sickle crisis)  For questions or updates, please contact [facility] Please consult [email or web address] for contact info under Cardiology/STEMI.      Tonnia Bardin, MD Cardiologist [place]  [facility]  [address], [address] [place], [place] [address] [phone]  10:32 AM  

## 2020-05-29 NOTE — Progress Notes (Signed)
PROGRESS NOTE    Pamela Hudson  ZOX:[id number] DOB: 06-Feb-1986 DOA: 05/27/2020 PCP: Judd Lien, PA-C    Brief Narrative:  Pamela Hudson is a 35 y.o. female with medical history significant of cardiomegaly, CHF, chronic pain, CVA, HTN, PCOS, sickle cell anemia, DM2, chronic right heart strain, pulmonary Hypertension, CKD stage 3, HTN, transient A.fib COVID 2020    Presented with   Gradual leg swelling patient with CHF, chronic pain.  Patient was concerned that she was volume overloaded.  She has been having also worsening shortness of breath for the past few days to weeks.  States she has been taking her medication Reports up to 5 pound weight gain over the past 3 days  3/17- doe when walking the stairs. Feels close to baseline? Plan for [facility] cath today Tmax 100.8   Consultants:   Cardiology  Procedures:   Antimicrobials:       Subjective: Less sob, doe only. No cp  Objective: Vitals:   05/28/20 2022 05/29/20 0015 05/29/20 0444 05/29/20 0500  BP: (!) 140/51 (!) 103/52 (!) 94/52   Pulse: (!) 123 (!) 122 (!) 108   Resp: (!) 23 20 20    Temp: 100.2 F (37.9 C) (!) 100.8 F (38.2 C) 99.2 F (37.3 C)   TempSrc: Oral Oral Oral   SpO2: 99% 95% 98%   Weight:  63.4 kg  63.4 kg  Height:        Intake/Output Summary (Last 24 hours) at 05/29/2020 0837 Last data filed at 05/29/2020 0136 Gross per 24 hour  Intake 897 ml  Output 1000 ml  Net -103 ml   Filed Weights   05/28/20 0416 05/29/20 0015 05/29/20 0500  Weight: 62.9 kg 63.4 kg 63.4 kg    Examination:  Calm, nad Decrease bs at bases, no w/r Regular s1/s2 no gallop Soft benign +bs Decrease lower extremity edema  [id number]     Data Reviewed: I have personally reviewed following labs and imaging studies  CBC: Recent Labs  Lab 05/27/20 1604 05/28/20 0221  WBC 12.8* 12.5*  NEUTROABS 7.9* 7.5  HGB 7.8* 7.4*  HCT 20.9* 19.9*  MCV 122.2* 122.8*  PLT 222 194   Basic Metabolic Panel: Recent Labs  Lab  05/27/20 1700 05/28/20 0221 05/29/20 0615  NA 132* 132* 130*  K 5.1 4.9 5.4*  CL 107 107 104  CO2 15* 17* 18*  GLUCOSE 202* 170* 139*  BUN 55* 50* 59*  CREATININE 1.49* 1.63* 2.41*  CALCIUM 8.5* 8.5* 8.0*  MG  --  2.4  --   PHOS  --  4.5  --    GFR: Estimated Creatinine Clearance: 27.1 mL/min (A) (by C-G formula based on SCr of 2.41 mg/dL (H)). Liver Function Tests: Recent Labs  Lab 05/27/20 1700 05/28/20 0221  AST 98* 89*  ALT 88* 85*  ALKPHOS 118 106  BILITOT 5.0* 5.6*  PROT 9.4* 9.5*  ALBUMIN 3.2* 2.8*   Recent Labs  Lab 05/27/20 1700  LIPASE 30   No results for input(s): AMMONIA in the last 168 hours. Coagulation Profile: Recent Labs  Lab 05/28/20 0221  INR 1.4*   Cardiac Enzymes: No results for input(s): CKTOTAL, CKMB, CKMBINDEX, TROPONINI in the last 168 hours. BNP (last 3 results) No results for input(s): PROBNP in the last 8760 hours. HbA1C: Recent Labs    05/28/20 0221  HGBA1C 5.2   CBG: Recent Labs  Lab 05/28/20 1618 05/28/20 2024 05/29/20 0014 05/29/20 0444 05/29/20 0731  GLUCAP 232* 235* 144* 77 151*  Lipid Profile: No results for input(s): CHOL, HDL, LDLCALC, TRIG, CHOLHDL, LDLDIRECT in the last 72 hours. Thyroid Function Tests: Recent Labs    05/27/20 2251 05/28/20 0221  TSH  --  5.929*  FREET4 1.27*  --    Anemia Panel: Recent Labs    05/27/20 1604  RETICCTPCT 7.6*   Sepsis Labs: No results for input(s): PROCALCITON, LATICACIDVEN in the last 168 hours.  Recent Results (from the past 240 hour(s))  Resp Panel by RT-PCR (Flu A&B, Covid) Nasopharyngeal Swab     Status: None   Collection Time: 05/27/20  4:26 PM   Specimen: Nasopharyngeal Swab; Nasopharyngeal(NP) swabs in vial transport medium  Result Value Ref Range Status   SARS Coronavirus 2 by RT PCR NEGATIVE NEGATIVE Final    Comment: (NOTE) SARS-CoV-2 target nucleic acids are NOT DETECTED.  The SARS-CoV-2 RNA is generally detectable in upper  respiratory specimens during the acute phase of infection. The lowest concentration of SARS-CoV-2 viral copies this assay can detect is 138 copies/mL. A negative result does not preclude SARS-Cov-2 infection and should not be used as the sole basis for treatment or other patient management decisions. A negative result may occur with  improper specimen collection/handling, submission of specimen other than nasopharyngeal swab, presence of viral mutation(s) within the areas targeted by this assay, and inadequate number of viral copies(<138 copies/mL). A negative result must be combined with clinical observations, patient history, and epidemiological information. The expected result is Negative.  Fact Sheet for Patients:  BloggerCourse.com  Fact Sheet for Healthcare Providers:  SeriousBroker.it  This test is no t yet approved or cleared by the [place] FDA and  has been authorized for detection and/or diagnosis of SARS-CoV-2 by FDA under an Emergency Use Authorization (EUA). This EUA will remain  in effect (meaning this test can be used) for the duration of the COVID-19 declaration under Section 564(b)(1) of the Act, 21 U.S.C.section 360bbb-3(b)(1), unless the authorization is terminated  or revoked sooner.       Influenza A by PCR NEGATIVE NEGATIVE Final   Influenza B by PCR NEGATIVE NEGATIVE Final    Comment: (NOTE) The Xpert Xpress SARS-CoV-2/FLU/RSV plus assay is intended as an aid in the diagnosis of influenza from Nasopharyngeal swab specimens and should not be used as a sole basis for treatment. Nasal washings and aspirates are unacceptable for Xpert Xpress SARS-CoV-2/FLU/RSV testing.  Fact Sheet for Patients: BloggerCourse.com  Fact Sheet for Healthcare Providers: SeriousBroker.it  This test is not yet approved or cleared by the [place] FDA and has been  authorized for detection and/or diagnosis of SARS-CoV-2 by FDA under an Emergency Use Authorization (EUA). This EUA will remain in effect (meaning this test can be used) for the duration of the COVID-19 declaration under Section 564(b)(1) of the Act, 21 U.S.C. section 360bbb-3(b)(1), unless the authorization is terminated or revoked.  Performed at [facility], [address] [place], [place] [address]   Culture, blood (routine x 2)     Status: None (Preliminary result)   Collection Time: 05/28/20 10:45 PM   Specimen: BLOOD RIGHT HAND  Result Value Ref Range Status   Specimen Description BLOOD RIGHT HAND  Final   Special Requests   Final    BOTTLES DRAWN AEROBIC ONLY Blood Culture adequate volume Performed at [facility], [address], [place], [place] [address]    Culture PENDING  Incomplete   Report Status PENDING  Incomplete  Culture, blood (routine x 2)  Status: None (Preliminary result)   Collection Time: 05/28/20 10:45 PM   Specimen: BLOOD LEFT HAND  Result Value Ref Range Status   Specimen Description BLOOD LEFT HAND  Final   Special Requests   Final    BOTTLES DRAWN AEROBIC ONLY Blood Culture results may not be optimal due to an inadequate volume of blood received in culture bottles Performed at [facility], [address], [place], [place] [address]    Culture PENDING  Incomplete   Report Status PENDING  Incomplete         Radiology Studies: US Abdomen Complete  Result Date: 05/28/2020 CLINICAL DATA:  Elevated liver function tests, chronic renal insufficiency EXAM: ABDOMEN ULTRASOUND COMPLETE COMPARISON:  04/17/2020 FINDINGS: Gallbladder: Surgically absent Common bile duct: Diameter: 4 mm Liver: Stable heterogeneous increased liver echotexture most consistent with hepatic steatosis. No focal liver abnormality. No intrahepatic duct dilation. Portal vein is patent on color Doppler imaging with normal direction of blood flow towards the  liver. IVC: No abnormality visualized. Pancreas: Visualized portion unremarkable. Spleen: Suboptimally visualized due to splenic atrophy noted on previous CT exams. Right Kidney: Length: 11.7 cm. Echogenicity within normal limits. No mass or hydronephrosis visualized. Left Kidney: Length: 10.3 cm. Echogenicity within normal limits. No mass or hydronephrosis visualized. Abdominal aorta: No aneurysm visualized. Other findings: None. IMPRESSION: 1. Increased liver echotexture compatible with hepatic steatosis. 2. Status post cholecystectomy. 3. 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 Electronically Signed   By: Sharlet Salina M.D.   On: 05/28/2020 02:18   DG Chest Portable 1 View  Result Date: 05/27/2020 CLINICAL DATA:  Weakness and shortness of breath. EXAM: PORTABLE CHEST 1 VIEW COMPARISON:  08/30/2019 FINDINGS: Right chest wall port a catheter is noted with tip in the SVC. Cardiac enlargement, unchanged. New right pleural effusion with overlying subsegmental atelectasis. Pulmonary vascular congestion. The visualized osseous structures are unremarkable. IMPRESSION: 1. Cardiac enlargement, pulmonary vascular congestion, and new right pleural effusion with overlying subsegmental atelectasis. Findings concerning for CHF. Electronically Signed   By: Signa Kell M.D.   On: 05/27/2020 17:04   ECHOCARDIOGRAM COMPLETE  Result Date: 05/28/2020    ECHOCARDIOGRAM REPORT   Patient Name:   Pamela Hudson Date of Exam: 05/28/2020 Medical Rec #:  [id number]     Height:       59.0 in Accession #:    [id number]    Weight:       138.7 lb Date of Birth:  08-24-1985     BSA:          1.579 m Patient Age:    34 years      BP:           101/64 mmHg Patient Gender: F             HR:           108 bpm. Exam Location:  Inpatient Procedure: 2D Echo, 3D Echo, Cardiac Doppler and Color Doppler Indications:    I51.7 Cardiomegaly  History:        Patient has prior history of  Echocardiogram examinations, most                 recent 08/31/2018. Abnormal ECG, Stroke and Pulmonary HTN; Risk                 Factors:Hypertension and Diabetes. Sickle cell.  Sonographer:    Sheralyn Boatman RDCS Referring Phys: [id number] ANASTASSIA DOUTOVA IMPRESSIONS  1.  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 The leaflets are fixed and appear immobile. Leaflet immobility is out of proportion to annular dilation. Would consider primary leaflet pathology such as carcinoid involvement in the work-up in addition to the patient's long history of severe pulmonary hypertension. The tricuspid valve is abnormal. Tricuspid valve regurgitation is severe.  2. RVSP is severely underestimated in the setting of severe tricuspid regurgitation. Right ventricular systolic function is severely reduced. The right ventricular size is severely enlarged. There is normal pulmonary artery systolic pressure. The estimated right ventricular systolic pressure is 34.5 mmHg.  3. Left ventricular ejection fraction, by estimation, is 45 to 50%. Left ventricular ejection fraction by 3D volume is 45 %. The left ventricle has mildly decreased function. The left ventricle demonstrates global hypokinesis. Indeterminate diastolic filling due to E-A fusion. There is the interventricular septum is flattened in systole and diastole, consistent with right ventricular pressure and volume overload.  4. Right atrial size was severely dilated.  5. A small pericardial effusion is present. The pericardial effusion is circumferential.  6. The mitral valve is grossly normal. Mild to moderate mitral valve regurgitation. No evidence of mitral stenosis.  7. The aortic valve is tricuspid. Aortic valve regurgitation is not visualized. No aortic stenosis is present.  8. The inferior vena cava is dilated in size with <50% respiratory variability, suggesting right atrial pressure of 15 mmHg. Comparison(s): Changes from prior study are noted. RV is severely dilated  with severely reduced function. Severe torrential TR is present. LVEF is mildly reduced 45-50%. RVSP underestimated in setting of severe TR. FINDINGS  Left Ventricle: Left ventricular ejection fraction, by estimation, is 45 to 50%. Left ventricular ejection fraction by 3D volume is 45 %. The left ventricle has mildly decreased function. The left ventricle demonstrates global hypokinesis. The left ventricular internal cavity size was normal in size. There is no left ventricular hypertrophy. The interventricular septum is flattened in systole and diastole, consistent with right ventricular pressure and volume overload. Indeterminate diastolic filling due to E-A fusion. Right Ventricle: RVSP is severely underestimated in the setting of severe tricuspid regurgitation. The right ventricular size is severely enlarged. No increase in right ventricular wall thickness. Right ventricular systolic function is severely reduced. There is normal pulmonary artery systolic pressure. The tricuspid regurgitant velocity is 2.21 m/s, and with an assumed right atrial pressure of 15 mmHg, the estimated right ventricular systolic pressure is 34.5 mmHg. Left Atrium: Left atrial size was normal in size. Right Atrium: Right atrial size was severely dilated. Pericardium: A small pericardial effusion is present. The pericardial effusion is circumferential. Mitral Valve: The mitral valve is grossly normal. Mild to moderate mitral valve regurgitation. No evidence of mitral valve stenosis. MV peak gradient, 14.0 mmHg. The mean mitral valve gradient is 4.0 mmHg. Tricuspid Valve: There is severe, torrential tricuspid regurgitation. The leaflets are fixed and appear immobile. Leaflet immobility is out of proportion to annular dilation. Would consider primary leaflet pathology such as carcinoid involvement in the work-up in addition to the patient's long history of severe pulmonary hypertension. The tricuspid valve is abnormal. Tricuspid valve  regurgitation is severe. No evidence of tricuspid stenosis. The flow in the hepatic veins is reversed during ventricular systole. Aortic Valve: The aortic valve is tricuspid. Aortic valve regurgitation is not visualized. No aortic stenosis is present. Pulmonic Valve: The pulmonic valve was grossly normal. Pulmonic valve regurgitation is mild. No evidence of pulmonic stenosis. Aorta: The aortic root and ascending aorta are structurally normal, with no evidence  of dilitation. Venous: The inferior vena cava is dilated in size with less than 50% respiratory variability, suggesting right atrial pressure of 15 mmHg. IAS/Shunts: There is left bowing of the interatrial septum, suggestive of elevated right atrial pressure. The atrial septum is grossly normal.  LEFT VENTRICLE PLAX 2D LVIDd:         4.80 cm         Diastology LVIDs:         3.60 cm         LV e' medial:    9.25 cm/s LV PW:         1.15 cm         LV E/e' medial:  14.9 LV IVS:        0.90 cm         LV e' lateral:   11.40 cm/s LVOT diam:     1.80 cm         LV E/e' lateral: 12.1 LV SV:         56 LV SV Index:   36 LVOT Area:     2.54 cm        3D Volume EF                                LV 3D EF:    Left                                             ventricular LV Volumes (MOD)                            ejection LV vol d, MOD    77.2 ml                    fraction by A2C:                                        3D volume LV vol d, MOD    98.4 ml                    is 45 %. A4C: LV vol s, MOD    36.1 ml A2C:                           3D Volume EF: LV vol s, MOD    59.1 ml       3D EF:        45 % A4C: LV SV MOD A2C:   41.1 ml LV SV MOD A4C:   98.4 ml LV SV MOD BP:    43.2 ml RIGHT VENTRICLE             IVC RV S prime:     10.90 cm/s  IVC diam: 3.00 cm TAPSE (M-mode): 1.9 cm LEFT ATRIUM           Index       RIGHT ATRIUM           Index LA diam:      4.00 cm 2.53 cm/m  RA Area:     25.80 cm LA Vol (A2C):  56.3 ml 35.66 ml/m RA Volume:   102.00 ml 64.61 ml/m LA  Vol (A4C): 39.5 ml 25.02 ml/m  AORTIC VALVE              PULMONIC VALVE LVOT Vmax:   134.00 cm/s  PR End Diast Vel: 2.37 msec LVOT Vmean:  104.000 cm/s LVOT VTI:    0.222 m  AORTA Ao Root diam: 2.70 cm Ao Asc diam:  3.30 cm MITRAL VALVE                 TRICUSPID VALVE MV Area (PHT): 5.84 cm      TR Peak grad:   19.5 mmHg MV Area VTI:   1.70 cm      TR Vmax:        221.00 cm/s MV Peak grad:  14.0 mmHg MV Mean grad:  4.0 mmHg      SHUNTS MV Vmax:       1.87 m/s      Systemic VTI:  0.22 m MV Vmean:      81.7 cm/s     Systemic Diam: 1.80 cm MV Decel Time: 130 msec MR Peak grad:    83.7 mmHg MR Mean grad:    56.0 mmHg MR Vmax:         457.50 cm/s MR Vmean:        356.5 cm/s MR PISA:         1.01 cm MR PISA Eff ROA: 9 mm MR PISA Radius:  0.40 cm MV E velocity: 138.00 cm/s Lennie Odor MD Electronically signed by Lennie Odor MD Signature Date/Time: 05/28/2020/2:05:27 PM    Final         Scheduled Meds: . Chlorhexidine Gluconate Cloth  6 each Topical Daily  . enoxaparin (LOVENOX) injection  40 mg Subcutaneous Daily  . feeding supplement  237 mL Oral BID BM  . furosemide  80 mg Intravenous BID  . hydroxyurea  1,000 mg Oral Daily  . insulin aspart  0-9 Units Subcutaneous Q4H  . insulin glargine  25 Units Subcutaneous QHS  . multivitamin with minerals  1 tablet Oral Daily  . sodium chloride flush  10-40 mL Intracatheter Q12H  . sodium chloride flush  3 mL Intravenous Q12H   Continuous Infusions: . sodium chloride      Assessment & Plan:   Active Problems:   Sickle cell anemia (HCC)   Elevated bilirubin   Leukocytosis   Dyspnea   Acute on chronic right-sided heart failure (HCC)   Chronic right-sided heart failure (HCC)   Pulmonary hypertension (HCC)   CKD (chronic kidney disease) stage 3, GFR 30-59 ml/min (HCC)   DM2 (diabetes mellitus, type 2) (HCC)   35 y.o. female with medical history significant of cardiomegaly, CHF, chronic pain, CVA, HTN, PCOS, sickle cell anemia, DM2, chronic  right heart strain, pulmonary Hypertension, CKD stage 3, HTN, transient A.fib Admitted for acute on chronic Right heart failure  Present on Admission: Acute biventricular HF Echo 45 to 50%.  RV systolic function severely reduced Severe TR Plan for right heart cath today Will need Lasix IV possibly drip as patient still volume overloaded BNP elevated 3195.8 Cardiology following I's and O's Daily weight   . Sickle cell anemia (HCC)  Clinically stable H&H Continue hydroxyurea Check a.m. CBC   Fever-100.8this am. Ck bcx Ck ucx IS  . Pulmonary hypertension (HCC) - Cardiology following WHO functional class III stage C RHC today, then likely lasix gtt Fluid restriction <2L V/q scan not appropraite  at this time   . Leukocytosis -chronic  Had low grade fever today Will ck cx, ck cbc in am   . CKD (chronic kidney disease) stage 3b Worsening, cr 2.44 Possibly due to hypoperfusion from chf Avoid nephrotoxic meds Monitor closely, if does not improve once lasix starts, will consult nephrology   . Elevated bilirubin  - chronic abnormal LFTs history of febrile hepatomegaly in the past Abdominal ultrasound with increased liver echotexture compatible with hepatic steatosis  Dm2-  - Order Sensitive  SSI  A1c 5.2 TSH 5.9129 and FT4 1.27, will need reck once acute medical issues stablized and reck levels in 4 weeks    DVT prophylaxis: Lovenox Code Status: Full Family Communication: None at bedside  Status is: Inpatient  Remains inpatient appropriate because:IV treatments appropriate due to intensity of illness or inability to take PO   Dispo: The patient is from: Home              Anticipated d/c is to: Home              Patient currently is not medically stable to d/c.   Difficult to place patient No  Patient still receiving IV Lasix due to volume overload          LOS: 2 days   Time spent: 35 minutes with more than 50% on COC    Lynn Ito,  MD [facility] Pager [phone]  If 7PM-7AM, please contact night-coverage 05/29/2020, 8:37 AM

## 2020-05-29 NOTE — Progress Notes (Addendum)
Heart Failure Nurse Navigator Progress Note  Assessed for Heart & Vascular TOC clinic readiness.  Unfortunately at this time the patient does not meet criteria due to her outpatient care providers are all through [facility]. Pt wants to continue care with [facility], will continue to follow for confirmation of care s/p hospitalization. Per pt cardiologist was going to recommend a new cards team at [facility] in regards to new ECHO findings. Pt pleasant during interview process, no social determinants of health barriers noted- pt drives, has not missed appts, able to get and take meds as prescribed, has good social support, no smoking, alcohol, or illicit drug use.   Navigator available for reassessment of patient.   Ozella Rocks, RN, BSN Heart Failure Nurse Navigator [phone]

## 2020-05-29 NOTE — Consult Note (Addendum)
Advanced Heart Failure Team Consult Note   Primary Physician: Curt Jews, PA-C PCP-Cardiologist:  No primary care provider on file.  Pulmonary: [place]  Reason for Consultation: Pulmonary Hypertension   HPI:    Pamela Hudson is seen today for evaluation of pulmonary hypertension at the request of Dr Julieanne Manson, Cardiology.   Pamela Hudson is a 35 year old with a history of sickle cell anemia, chronic diastolic heart failure/RV failuure, SVT, DMII, CVA, mild-mod TR, OSA, and pulmonary hypertension.   Admitted to [facility] in 2018 with increased shortness of breath and volume overload. Diuresed with IV lasix.  Had [facility] with elevated filling pressures, PVR 1.6, and high output. Dr Haroldine Laws was consulted after cath. Diuresed >70 pounds on lasix drip + diamox.   She has been followed at [facility] for cardiology/pulmonary management. Had cardiology follow up in January of this year.  Weight at that time was 126 pounds. Torsemide was cut back to 20 mg one day then 10 mg the next.   Now admitted with increased shortness of breath. CXR with pulmonary edema. Started on IV lasix but developed worsening AKI w/ attempts at diuresis, SCr 1.5>>2.5. 2D Echo showed biventricular dysfunction R>L. LVEF 45-50%. RV severely reduced w/ severe TR. Underwent RHC showing very mild PAH, severe RV failure w/ preserved CO. No evidence of intracardiac shunt.   Started on milrinone for RV support. AHF team asked to assist.   Of note, also bacteremic. BC + for staph aureus. WBC 16K. On Ancef    RHC Findings:  RA = 23 RV = 34/20 PA = 35/14 (26)  PCW = 16 Fick cardiac output/index = 9.1/5.8 Thermo CO/CI = 6.9/4.3 PVR = 1.1 (Fick) 1.5 (Thermo) Ao sat = 96% PA sat = 66%, 68% High SVC sat = 73% PAPi = 0.9  Assessment:  1. Very mild PAH 2. Severe RV failure however cardiac output preserved 3. No evidence of intracardiac shunt     2D Echo 05/28/20  There is severe, torrential tricuspid regurgitation. The  leaflets are fixed and appear immobile. Leaflet immobility is out of proportion to annular dilation. Would consider primary leaflet pathology such as carcinoid involvement in the work-up in addition to the patient's long history of severe pulmonary hypertension. The tricuspid valve is abnormal. Tricuspid valve regurgitation is severe. 2. RVSP is severely underestimated in the setting of severe tricuspid regurgitation. Right ventricular systolic function is severely reduced. The right ventricular size is severely enlarged. There is normal pulmonary artery systolic pressure. The estimated right ventricular systolic pressure is 66.0 mmHg. 3. Left ventricular ejection fraction, by estimation, is 45 to 50%. Left ventricular ejection fraction by 3D volume is 45 %. The left ventricle has mildly decreased function. The left ventricle demonstrates global hypokinesis. Indeterminate diastolic filling due to E-A fusion. There is the interventricular septum is flattened in systole and diastole, consistent with right ventricular pressure and volume overload. 4. Right atrial size was severely dilated. 5. A small pericardial effusion is present. The pericardial effusion is circumferential. 6. The mitral valve is grossly normal. Mild to moderate mitral valve regurgitation. No evidence of mitral stenosis. 7. The aortic valve is tricuspid. Aortic valve regurgitation is not visualized. No aortic stenosis is present. 8. The inferior vena cava is dilated  2022 Echo 45-50%, RV severely reduced, RVSP 34, RA pressure 15.  2021 ECHO RV was noted to be significantly smaller and TR was less significant. LVEF 55-60%. Normal diastolic function. Mild RV dilatation with mild RV hypertrophy and normal  systolic function. Right and left atria mildly to moderately dilated. Mild MR, mild-moderate TR, mild PAH with RVSP 46.  2018 Echo EF 60-65% RV normal , RA/LA mildly dilated, mod -severe TR     Review of Systems: [y] =  yes, _0  = no    General: Weight gain _1 ; Weight loss _2 ; Anorexia _3 ; Fatigue [Y ]; Fever _4 ; Chills _5 ; Weakness [ Y]   Cardiac: Chest pain/pressure _6 ; Resting SOB _7 ; Exertional SOB [ Y]; Orthopnea _8 ; Pedal Edema _9 ; Palpitations _10 ; Syncope _11 ; Presyncope _12 ; Paroxysmal nocturnal dyspnea_13    Pulmonary: Cough _14 ; Wheezing_15 ; Hemoptysis_16 ; Sputum _17 ; Snoring _18    GI: Vomiting_19 ; Dysphagia_20 ; Melena_21 ; Hematochezia _22 ; Heartburn_23 ; Abdominal pain _24 ; Constipation _25 ; Diarrhea _26 ; BRBPR _27    GU: Hematuria_28 ; Dysuria _29 ; Nocturia_30    Vascular: Pain in legs with walking _31 ; Pain in feet with lying flat _32 ; Non-healing sores _33 ; Stroke _34 ; TIA _35 ; Slurred speech _36 ;   Neuro: Headaches_37 ; Vertigo_38 ; Seizures_39 ; Paresthesias_40 ;Blurred vision _41 ; Diplopia _42 ; Vision changes _43    Ortho/Skin: Arthritis _44 ; Joint pain [Y ]; Muscle pain _45 ; Joint swelling _46 ; Back Pain [Y ]; Rash _47    Psych: Depression_48 ; Anxiety_49    Heme: Bleeding problems _50 ; Clotting disorders _51 ; Anemia _52    Endocrine: Diabetes [Y ]; Thyroid dysfunction_53   Home Medications Prior to Admission medications   Medication Sig Start Date End Date Taking? Authorizing Provider  digoxin (LANOXIN) 0.125 MG tablet Take 1 tablet (0.125 mg total) by mouth daily. 09/05/19  Yes Georgette Shell, MD  empagliflozin (JARDIANCE) 25 MG TABS tablet Take 25 mg by mouth in the morning and at bedtime.   Yes [provider]  Etonogestrel (IMPLANON [place]) Inject 1 each into the skin once. Implanted spring 2019   Yes [provider]  folic acid (FOLVITE) 1 MG tablet Take 1 mg by mouth daily. 11/21/15  Yes [provider]  hydroxyurea (HYDREA) 500 MG capsule Take 1,000 mg by mouth daily.    Yes [provider]  insulin aspart (NOVOLOG FLEXPEN) 100 UNIT/ML FlexPen Inject 5 Units into the skin 3 (three) times daily with meals. Patient taking differently: Inject 5  Units into the skin 3 (three) times daily with meals. Sliding scale 09/04/19  Yes Georgette Shell, MD  insulin glargine (LANTUS) 100 unit/mL SOPN Inject 0.25 mLs (25 Units total) into the skin daily. Patient taking differently: Inject 30 Units into the skin at bedtime. 09/04/19  Yes Georgette Shell, MD  Insulin Pen Needle 32G X 4 MM MISC 5 Units/oz/day by Does not apply route in the morning, at noon, and at bedtime. 09/04/19  Yes Georgette Shell, MD  magnesium oxide (MAG-OX) 400 MG tablet Take 400 mg by mouth daily. 01/26/18  Yes [provider]  metFORMIN (GLUCOPHAGE) 500 MG tablet Take 1,000 mg by mouth 2 (two) times daily with a meal.   Yes [provider]  OXBRYTA 500 MG TABS tablet Take 1,500 tablets by mouth daily. 08/28/19  Yes [provider]  torsemide (DEMADEX) 20 MG tablet Take 1 tablet (20 mg total) by mouth daily. 09/04/19  Yes Georgette Shell, MD  Vitamin D, Ergocalciferol, (DRISDOL) 1.25 MG (50000 UT) CAPS capsule Take 50,000  Units by mouth every 30 (thirty) days. 01/26/18  Yes [provider]  blood glucose meter kit and supplies Dispense based on patient and insurance preference. Use up to four times daily as directed. (FOR ICD-10 E10.9, E11.9). 09/04/19   Georgette Shell, MD  Deferasirox 360 MG TABS Take 720 mg by mouth at bedtime.    [provider]    Past Medical History: Past Medical History:  Diagnosis Date   Atrial fibrillation ([place])    Cardiomegaly    CHF (congestive heart failure) ([place])    Chronic kidney disease (CKD), stage III (moderate) (HCC)    Chronic pain    COVID-19    CVA (cerebral infarction) 02/2002   Diabetes mellitus without complication (HCC)    Hypertension    Liver disease    ?iron   PCOS (polycystic ovarian syndrome)    Renal stone    Sickle cell anemia (HCC)    Sleep apnea     Past Surgical History: Past Surgical History:  Procedure Laterality Date    CHOLECYSTECTOMY     LIVER BIOPSY  2007   PARATHYROIDECTOMY     PORTACATH PLACEMENT     RIGHT HEART CATH N/A 09/01/2018   Procedure: RIGHT HEART CATH;  Surgeon: Adrian Prows, MD;  Location: [facility];  Service: Cardiovascular;  Laterality: N/A;    Family History: Family History  Problem Relation Age of Onset   Diabetes Mellitus II Father    CAD Father    Diabetes Mellitus II Paternal Grandmother     Social History: Social History   Socioeconomic History   Marital status: Significant Other    Spouse name: Retail banker   Number of children: 1   Years of education: 14   Highest education level: Associate degree: occupational, Hotel manager, or vocational program  Occupational History   Not on file  Tobacco Use   Smoking status: Former Smoker    Types: Cigarettes   Smokeless tobacco: Never Used  Scientific laboratory technician Use: Never used  Substance and Sexual Activity   Alcohol use: Not Currently   Drug use: No   Sexual activity: Yes    Birth control/protection: Implant  Other Topics Concern   Not on file  Social History Narrative   Not on file   Social Determinants of Health   Financial Resource Strain: Low Risk    Difficulty of Paying Living Expenses: Not very hard  Food Insecurity: No Food Insecurity   Worried About Charity fundraiser in the Last Year: Never true   [place] in the Last Year: Never true  Transportation Needs: No Transportation Needs   Lack of Transportation (Medical): No   Lack of Transportation (Non-Medical): No  Physical Activity: Insufficiently Active   Days of Exercise per Week: 2 days   Minutes of Exercise per Session: 30 min  Stress: Not on file  Social Connections: Unknown   Frequency of Communication with Friends and Family: Not on file   Frequency of Social Gatherings with Friends and Family: Twice a week   Attends Religious Services: 1 to 4 times per year   Active Member of [employer] or Organizations: No    Attends Archivist Meetings: Never   Marital Status: Living with partner    Allergies:  Allergies  Allergen Reactions   Oxycodone-Acetaminophen Swelling and Other (See Comments)    Lips swell   Prednisone Other (See Comments)    Elevates BGL    Objective:  Vital Signs:   Temp:  [99 F (37.2 C)-101.3 F (38.5 C)] 99 F (37.2 C) (03/17 1143) Pulse Rate:  [101-123] 101 (03/17 1143) Resp:  [16-23] 16 (03/17 1143) BP: (94-140)/(51-68) 99/62 (03/17 1143) SpO2:  [95 %-100 %] 100 % (03/17 1143) Weight:  [63.4 kg] 63.4 kg (03/17 0500)    Weight change: Filed Weights   05/28/20 0416 05/29/20 0015 05/29/20 0500  Weight: 62.9 kg 63.4 kg 63.4 kg    Intake/Output:   Intake/Output Summary (Last 24 hours) at 05/29/2020 1307 Last data filed at 05/29/2020 0136 Gross per 24 hour  Intake 480 ml  Output 1000 ml  Net -520 ml      Physical Exam    General:  Young AAF. No resp difficulty HEENT: normal Neck: supple. JVP elevated to ear . Carotids 2+ bilat; no bruits. No lymphadenopathy or thyromegaly appreciated. Cor: PMI nondisplaced. Regular rate & rhythm. 3/6 TR murmur  Lungs: decreased BS at the bases bilaterally  Abdomen: soft, nontender, nondistended. No hepatosplenomegaly. No bruits or masses. Good bowel sounds. Extremities: no cyanosis, clubbing, rash, edema Neuro: alert & orientedx3, cranial nerves grossly intact. moves all 4 extremities w/o difficulty. Affect pleasant   Telemetry   Sinus tach low 100s   EKG    Admit EKG sinus tach 138 bpm   Labs   Basic Metabolic Panel: Recent Labs  Lab 05/27/20 1700 05/28/20 0221 05/29/20 0615 05/29/20 1129  NA 132* 132* 130* 130*  K 5.1 4.9 5.4* 5.2*  CL 107 107 104 105  CO2 15* 17* 18* 18*  GLUCOSE 202* 170* 139* 168*  BUN 55* 50* 59* 60*  CREATININE 1.49* 1.63* 2.41* 2.44*  CALCIUM 8.5* 8.5* 8.0* 8.0*  MG  --  2.4  --   --   PHOS  --  4.5  --   --     Liver Function Tests: Recent Labs  Lab  05/27/20 1700 05/28/20 0221 05/29/20 1129  AST 98* 89* 96*  ALT 88* 85* 82*  ALKPHOS 118 106 106  BILITOT 5.0* 5.6* 6.4*  PROT 9.4* 9.5* 8.8*  ALBUMIN 3.2* 2.8* 2.7*   Recent Labs  Lab 05/27/20 1700  LIPASE 30   No results for input(s): AMMONIA in the last 168 hours.  CBC: Recent Labs  Lab 05/27/20 1604 05/28/20 0221  WBC 12.8* 12.5*  NEUTROABS 7.9* 7.5  HGB 7.8* 7.4*  HCT 20.9* 19.9*  MCV 122.2* 122.8*  PLT 222 194    Cardiac Enzymes: No results for input(s): CKTOTAL, CKMB, CKMBINDEX, TROPONINI in the last 168 hours.  BNP: BNP (last 3 results) Recent Labs    05/27/20 1733 05/29/20 0615  BNP 1,228.5* 3,195.8*    ProBNP (last 3 results) No results for input(s): PROBNP in the last 8760 hours.   CBG: Recent Labs  Lab 05/28/20 2024 05/29/20 0014 05/29/20 0444 05/29/20 0731 05/29/20 1138  GLUCAP 235* 144* 77 151* 143*    Coagulation Studies: Recent Labs    05/28/20 0221  LABPROT 16.3*  INR 1.4*     Imaging   ECHOCARDIOGRAM COMPLETE  Result Date: 05/28/2020    ECHOCARDIOGRAM REPORT   Patient Name:   Pamela Hudson Date of Exam: 05/28/2020 Medical Rec #:  [id number]     Height:       59.0 in Accession #:    [id number]    Weight:       138.7 lb Date of Birth:  Jun 24, 1985     BSA:  1.579 m Patient Age:    34 years      BP:           101/64 mmHg Patient Gender: F             HR:           108 bpm. Exam Location:  Inpatient Procedure: 2D Echo, 3D Echo, Cardiac Doppler and Color Doppler Indications:    I51.7 Cardiomegaly  History:        Patient has prior history of Echocardiogram examinations, most                 recent 08/31/2018. Abnormal ECG, Stroke and Pulmonary HTN; Risk                 Factors:Hypertension and Diabetes. Sickle cell.  Sonographer:    Roseanna Rainbow RDCS Referring Phys: [place]  1. 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 The leaflets are fixed and appear immobile. Leaflet immobility is out of  proportion to annular dilation. Would consider primary leaflet pathology such as carcinoid involvement in the work-up in addition to the patient's long history of severe pulmonary hypertension. The tricuspid valve is abnormal. Tricuspid valve regurgitation is severe.  2. RVSP is severely underestimated in the setting of severe tricuspid regurgitation. Right ventricular systolic function is severely reduced. The right ventricular size is severely enlarged. There is normal pulmonary artery systolic pressure. The estimated right ventricular systolic pressure is 24.0 mmHg.  3. Left ventricular ejection fraction, by estimation, is 45 to 50%. Left ventricular ejection fraction by 3D volume is 45 %. The left ventricle has mildly decreased function. The left ventricle demonstrates global hypokinesis. Indeterminate diastolic filling due to E-A fusion. There is the interventricular septum is flattened in systole and diastole, consistent with right ventricular pressure and volume overload.  4. Right atrial size was severely dilated.  5. A small pericardial effusion is present. The pericardial effusion is circumferential.  6. The mitral valve is grossly normal. Mild to moderate mitral valve regurgitation. No evidence of mitral stenosis.  7. The aortic valve is tricuspid. Aortic valve regurgitation is not visualized. No aortic stenosis is present.  8. The inferior vena cava is dilated in size with <50% respiratory variability, suggesting right atrial pressure of 15 mmHg. Comparison(s): Changes from prior study are noted. RV is severely dilated with severely reduced function. Severe torrential TR is present. LVEF is mildly reduced 45-50%. RVSP underestimated in setting of severe TR. FINDINGS  Left Ventricle: Left ventricular ejection fraction, by estimation, is 45 to 50%. Left ventricular ejection fraction by 3D volume is 45 %. The left ventricle has mildly decreased function. The left ventricle demonstrates global hypokinesis.  The left ventricular internal cavity size was normal in size. There is no left ventricular hypertrophy. The interventricular septum is flattened in systole and diastole, consistent with right ventricular pressure and volume overload. Indeterminate diastolic filling due to E-A fusion. Right Ventricle: RVSP is severely underestimated in the setting of severe tricuspid regurgitation. The right ventricular size is severely enlarged. No increase in right ventricular wall thickness. Right ventricular systolic function is severely reduced. There is normal pulmonary artery systolic pressure. The tricuspid regurgitant velocity is 2.21 m/s, and with an assumed right atrial pressure of 15 mmHg, the estimated right ventricular systolic pressure is 97.3 mmHg. Left Atrium: Left atrial size was normal in size. Right Atrium: Right atrial size was severely dilated. Pericardium: A small pericardial effusion is present. The pericardial effusion is circumferential. Mitral  Valve: The mitral valve is grossly normal. Mild to moderate mitral valve regurgitation. No evidence of mitral valve stenosis. MV peak gradient, 14.0 mmHg. The mean mitral valve gradient is 4.0 mmHg. Tricuspid Valve: There is severe, torrential tricuspid regurgitation. The leaflets are fixed and appear immobile. Leaflet immobility is out of proportion to annular dilation. Would consider primary leaflet pathology such as carcinoid involvement in the work-up in addition to the patient's long history of severe pulmonary hypertension. The tricuspid valve is abnormal. Tricuspid valve regurgitation is severe. No evidence of tricuspid stenosis. The flow in the hepatic veins is reversed during ventricular systole. Aortic Valve: The aortic valve is tricuspid. Aortic valve regurgitation is not visualized. No aortic stenosis is present. Pulmonic Valve: The pulmonic valve was grossly normal. Pulmonic valve regurgitation is mild. No evidence of pulmonic stenosis. Aorta: The aortic  root and ascending aorta are structurally normal, with no evidence of dilitation. Venous: The inferior vena cava is dilated in size with less than 50% respiratory variability, suggesting right atrial pressure of 15 mmHg. IAS/Shunts: There is left bowing of the interatrial septum, suggestive of elevated right atrial pressure. The atrial septum is grossly normal.  LEFT VENTRICLE PLAX 2D LVIDd:         4.80 cm         Diastology LVIDs:         3.60 cm         LV e' medial:    9.25 cm/s LV PW:         1.15 cm         LV E/e' medial:  14.9 LV IVS:        0.90 cm         LV e' lateral:   11.40 cm/s LVOT diam:     1.80 cm         LV E/e' lateral: 12.1 LV SV:         56 LV SV Index:   36 LVOT Area:     2.54 cm        3D Volume EF                                LV 3D EF:    Left                                             ventricular LV Volumes (MOD)                            ejection LV vol d, MOD    77.2 ml                    fraction by A2C:                                        3D volume LV vol d, MOD    98.4 ml                    is 45 %. A4C: LV vol s, MOD    36.1 ml A2C:  3D Volume EF: LV vol s, MOD    59.1 ml       3D EF:        45 % A4C: LV SV MOD A2C:   41.1 ml LV SV MOD A4C:   98.4 ml LV SV MOD BP:    43.2 ml RIGHT VENTRICLE             IVC RV S prime:     10.90 cm/s  IVC diam: 3.00 cm TAPSE (M-mode): 1.9 cm LEFT ATRIUM           Index       RIGHT ATRIUM           Index LA diam:      4.00 cm 2.53 cm/m  RA Area:     25.80 cm LA Vol (A2C): 56.3 ml 35.66 ml/m RA Volume:   102.00 ml 64.61 ml/m LA Vol (A4C): 39.5 ml 25.02 ml/m  AORTIC VALVE              PULMONIC VALVE LVOT Vmax:   134.00 cm/s  PR End Diast Vel: 2.37 msec LVOT Vmean:  104.000 cm/s LVOT VTI:    0.222 m  AORTA Ao Root diam: 2.70 cm Ao Asc diam:  3.30 cm MITRAL VALVE                 TRICUSPID VALVE MV Area (PHT): 5.84 cm      TR Peak grad:   19.5 mmHg MV Area VTI:   1.70 cm      TR Vmax:        221.00 cm/s MV Peak grad:   14.0 mmHg MV Mean grad:  4.0 mmHg      SHUNTS MV Vmax:       1.87 m/s      Systemic VTI:  0.22 m MV Vmean:      81.7 cm/s     Systemic Diam: 1.80 cm MV Decel Time: 130 msec MR Peak grad:    83.7 mmHg MR Mean grad:    56.0 mmHg MR Vmax:         457.50 cm/s MR Vmean:        356.5 cm/s MR PISA:         1.01 cm MR PISA Eff ROA: 9 mm MR PISA Radius:  0.40 cm MV E velocity: 138.00 cm/s Eleonore Chiquito MD Electronically signed by Eleonore Chiquito MD Signature Date/Time: 05/28/2020/2:05:27 PM    Final       Medications:     Current Medications:  Chlorhexidine Gluconate Cloth  6 each Topical Daily   enoxaparin (LOVENOX) injection  40 mg Subcutaneous Daily   feeding supplement  237 mL Oral BID BM   hydroxyurea  1,000 mg Oral Daily   insulin aspart  0-9 Units Subcutaneous Q4H   insulin glargine  25 Units Subcutaneous QHS   multivitamin with minerals  1 tablet Oral Daily   sodium chloride flush  10-40 mL Intracatheter Q12H   sodium chloride flush  3 mL Intravenous Q12H   sodium chloride flush  3 mL Intravenous Q12H     Infusions:  sodium chloride     sodium chloride     sodium chloride 10 mL/hr at 05/29/20 1222        Assessment/Plan   1. 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 A/C Diastolic HF w/ Predominate RV Failure - Echo  45-50%, RV severely reduced, RVSP 34   - RHC w/ PCWP 16, preserved CO, low PAPi 0.9  - Now on Torsemide 20 mg daily  - Milrinone added today for RV support   3. AKI on CKD  - Creatinine on admit 1.5-->2.4 - SCr has stabilized at 2.5 - Resuming torsemide 20 mg today (home dose) - follow BMP   4. SVT  - improving, SR/ mild sinus tach - monitor w/ milrinone   5. Sickle Cell Anemia  - per primary team   6. TR - Severe on echo - likely functional from severely dilated RV  - ? Primary leaflet pathology such as carcinoid involvement  - ? Endocarditis given bacteremia. Consider  TEE to better assess the valve   7. Hyperkalemia - K 5.6 - treat w/ Lokelma and f/u BMP   8. Bacteremia - WBC trending up, 13.8>>16.1. AF  - BCs + for Staph  - on abx, Ancef  - ? TEE to better assess Tricuspid Valve ? Endocarditis   Length of Stay: 2  Lyda Jester, PA-C  Advanced Heart Failure Team Pager [phone] (M-F; 7a - 5p)  Please contact [facility] for night-coverage after hours (4p -7a ) and weekends on [email or web address]  Patient seen and examined with the above-signed Advanced Practice Provider and/or Housestaff. I personally reviewed laboratory data, imaging studies and relevant notes. I independently examined the patient and formulated the important aspects of the plan. I have edited the note to reflect any of my changes or salient points. I have personally discussed the plan with the patient and/or family.  35 y/o woman with sickle cell disease, PAH, RV failure and severe TR. Patient known to me from previous admission for R>>L HF in which we diuresed her 70 pounds. D/c weight about 175 (6/20).   Now presents with recurrent RV failure and AKI . RHC yesterday with very mild PAH and severe RV failure with PAPi 0.9 but CO high in setting of severe chronic anemia.   General:  Lying in bed No resp difficulty HEENT: normal Neck: supple. JVP to jaw Carotids 2+ bilat; no bruits. No lymphadenopathy or thryomegaly appreciated. Cor: PMI nondisplaced. Regular rate & rhythm. 2/6 TR Lungs: clear Abdomen: soft, nontender, nondistended. No hepatosplenomegaly. No bruits or masses. Good bowel sounds. Extremities: no cyanosis, clubbing, rash, 1+ edema Neuro: alert & orientedx3, cranial nerves grossly intact. moves all 4 extremities w/o difficulty. Affect pleasant  I suspect she has end-stage RV failure. Although CO is ok given AKI will try empiric course of milrinone to try and support RV and get some fluid off with slow diuresis with or torsemide. Suspect TR due to RV dilation. Bcx 2/2  from 3/16 growing staph (not speciated yet). Primary team following. If real will need prot removal.   She is likely close to end-stage unfortunately.   Glori Bickers, MD  7:21 PM

## 2020-05-29 NOTE — Interval H&P Note (Signed)
History and Physical Interval Note:  05/29/2020 6:53 PM  [name]  has presented today for surgery, with the diagnosis of chest pain.  The various methods of treatment have been discussed with the patient and family. After consideration of risks, benefits and other options for treatment, the patient has consented to  Procedure(s): RIGHT HEART CATH (N/A) as a surgical intervention.  The patient's history has been reviewed, patient examined, no change in status, stable for surgery.  I have reviewed the patient's chart and labs.  Questions were answered to the patient's satisfaction.     Pamela Hudson

## 2020-05-30 ENCOUNTER — Encounter (HOSPITAL_COMMUNITY): Payer: Self-pay | Admitting: Internal Medicine

## 2020-05-30 DIAGNOSIS — R7401 Elevation of levels of liver transaminase levels: Secondary | ICD-10-CM

## 2020-05-30 DIAGNOSIS — E1122 Type 2 diabetes mellitus with diabetic chronic kidney disease: Secondary | ICD-10-CM

## 2020-05-30 DIAGNOSIS — D5780 Other sickle-cell disorders without crisis: Secondary | ICD-10-CM | POA: Diagnosis not present

## 2020-05-30 DIAGNOSIS — F1021 Alcohol dependence, in remission: Secondary | ICD-10-CM

## 2020-05-30 DIAGNOSIS — N1832 Chronic kidney disease, stage 3b: Secondary | ICD-10-CM

## 2020-05-30 DIAGNOSIS — G8929 Other chronic pain: Secondary | ICD-10-CM

## 2020-05-30 DIAGNOSIS — Z87891 Personal history of nicotine dependence: Secondary | ICD-10-CM

## 2020-05-30 DIAGNOSIS — I272 Pulmonary hypertension, unspecified: Secondary | ICD-10-CM | POA: Diagnosis not present

## 2020-05-30 DIAGNOSIS — D649 Anemia, unspecified: Secondary | ICD-10-CM | POA: Diagnosis not present

## 2020-05-30 DIAGNOSIS — D571 Sickle-cell disease without crisis: Secondary | ICD-10-CM | POA: Diagnosis not present

## 2020-05-30 DIAGNOSIS — I50812 Chronic right heart failure: Secondary | ICD-10-CM | POA: Diagnosis not present

## 2020-05-30 DIAGNOSIS — I50813 Acute on chronic right heart failure: Secondary | ICD-10-CM | POA: Diagnosis not present

## 2020-05-30 DIAGNOSIS — I4891 Unspecified atrial fibrillation: Secondary | ICD-10-CM

## 2020-05-30 DIAGNOSIS — Z7984 Long term (current) use of oral hypoglycemic drugs: Secondary | ICD-10-CM

## 2020-05-30 LAB — GLUCOSE, CAPILLARY
Glucose-Capillary: 109 mg/dL — ABNORMAL HIGH (ref 70–99)
Glucose-Capillary: 130 mg/dL — ABNORMAL HIGH (ref 70–99)
Glucose-Capillary: 185 mg/dL — ABNORMAL HIGH (ref 70–99)
Glucose-Capillary: 200 mg/dL — ABNORMAL HIGH (ref 70–99)
Glucose-Capillary: 247 mg/dL — ABNORMAL HIGH (ref 70–99)
Glucose-Capillary: 309 mg/dL — ABNORMAL HIGH (ref 70–99)

## 2020-05-30 LAB — BLOOD CULTURE ID PANEL (REFLEXED) - BCID2

## 2020-05-30 LAB — CBC
HCT: 18.6 % — ABNORMAL LOW (ref 36.0–46.0)
Hemoglobin: 6.8 g/dL — CL (ref 12.0–15.0)
MCH: 44.4 pg — ABNORMAL HIGH (ref 26.0–34.0)
MCHC: 36.6 g/dL — ABNORMAL HIGH (ref 30.0–36.0)
MCV: 121.6 fL — ABNORMAL HIGH (ref 80.0–100.0)
Platelets: 184 10*3/uL (ref 150–400)
RBC: 1.53 MIL/uL — ABNORMAL LOW (ref 3.87–5.11)
RDW: 17.2 % — ABNORMAL HIGH (ref 11.5–15.5)
WBC: 16.1 10*3/uL — ABNORMAL HIGH (ref 4.0–10.5)
nRBC: 4.3 % — ABNORMAL HIGH (ref 0.0–0.2)

## 2020-05-30 LAB — BASIC METABOLIC PANEL
Anion gap: 8 (ref 5–15)
Anion gap: 9 (ref 5–15)
BUN: 69 mg/dL — ABNORMAL HIGH (ref 6–20)
BUN: 71 mg/dL — ABNORMAL HIGH (ref 6–20)
CO2: 18 mmol/L — ABNORMAL LOW (ref 22–32)
CO2: 17 mmol/L — ABNORMAL LOW (ref 22–32)
Calcium: 8.2 mg/dL — ABNORMAL LOW (ref 8.9–10.3)
Calcium: 8.1 mg/dL — ABNORMAL LOW (ref 8.9–10.3)
Chloride: 104 mmol/L (ref 98–111)
Chloride: 104 mmol/L (ref 98–111)
Creatinine, Ser: 2.53 mg/dL — ABNORMAL HIGH (ref 0.44–1.00)
Creatinine, Ser: 2.41 mg/dL — ABNORMAL HIGH (ref 0.44–1.00)
GFR, Estimated: 25 mL/min — ABNORMAL LOW (ref >60)
GFR, Estimated: 27 mL/min — ABNORMAL LOW (ref >60)
Glucose, Bld: 140 mg/dL — ABNORMAL HIGH (ref 70–99)
Glucose, Bld: 275 mg/dL — ABNORMAL HIGH (ref 70–99)
Potassium: 5.6 mmol/L — ABNORMAL HIGH (ref 3.5–5.1)
Potassium: 5.5 mmol/L — ABNORMAL HIGH (ref 3.5–5.1)
Sodium: 130 mmol/L — ABNORMAL LOW (ref 135–145)
Sodium: 130 mmol/L — ABNORMAL LOW (ref 135–145)

## 2020-05-30 LAB — RETICULOCYTES
Immature Retic Fract: 35.5 % — ABNORMAL HIGH (ref 2.3–15.9)
RBC.: 1.45 MIL/uL — ABNORMAL LOW (ref 3.87–5.11)
Retic Count, Absolute: 110.2 10*3/uL (ref 19.0–186.0)
Retic Ct Pct: 7.6 % — ABNORMAL HIGH (ref 0.4–3.1)

## 2020-05-30 MED ORDER — SODIUM POLYSTYRENE SULFONATE 15 GM/60ML PO SUSP
15.0000 g | Freq: Once | ORAL | Status: AC
  Administered 2020-05-30: 15 g via ORAL
  Filled 2020-05-30: qty 60

## 2020-05-30 MED ORDER — TORSEMIDE 20 MG PO TABS
20.0000 mg | ORAL_TABLET | Freq: Every day | ORAL | Status: DC
  Administered 2020-05-30: 20 mg via ORAL
  Filled 2020-05-30: qty 1

## 2020-05-30 MED ORDER — SODIUM ZIRCONIUM CYCLOSILICATE 10 G PO PACK
10.0000 g | PACK | Freq: Once | ORAL | Status: AC
  Administered 2020-05-30: 10 g via ORAL
  Filled 2020-05-30: qty 1

## 2020-05-30 MED ORDER — MILRINONE LACTATE IN DEXTROSE 20-5 MG/100ML-% IV SOLN
0.1250 ug/kg/min | INTRAVENOUS | Status: DC
  Administered 2020-05-30 – 2020-05-31 (×2): 0.125 ug/kg/min via INTRAVENOUS
  Filled 2020-05-30 (×2): qty 100

## 2020-05-30 MED ORDER — CEFAZOLIN SODIUM-DEXTROSE 2-4 GM/100ML-% IV SOLN
2.0000 g | Freq: Two times a day (BID) | INTRAVENOUS | Status: DC
  Administered 2020-05-30 – 2020-05-31 (×2): 2 g via INTRAVENOUS
  Filled 2020-05-30 (×4): qty 100

## 2020-05-30 MED ORDER — TORSEMIDE 20 MG PO TABS
40.0000 mg | ORAL_TABLET | Freq: Every day | ORAL | Status: DC
  Administered 2020-05-31: 40 mg via ORAL
  Filled 2020-05-30: qty 2

## 2020-05-30 MED ORDER — SODIUM POLYSTYRENE SULFONATE 15 GM/60ML PO SUSP: 30.0000 g | Freq: Once | ORAL | Status: DC

## 2020-05-30 MED ORDER — CYCLOBENZAPRINE HCL 5 MG PO TABS
5.0000 mg | ORAL_TABLET | Freq: Once | ORAL | Status: AC
  Administered 2020-05-30: 5 mg via ORAL
  Filled 2020-05-30: qty 1

## 2020-05-30 NOTE — Progress Notes (Signed)
Progress Note  Patient Name: Kanae Ignatowski Date of Encounter: 05/30/2020  Primary Cardiologist: No primary care provider on file.  [facility] vs [facility]; Otherwise can see Nethan Caudillo or AHF  Subjective   Patient feels similar from 05/29/20.  Neck is sore.  Notes that she feels better than she did in 2020, but is not back to baseline.  Inpatient Medications    Scheduled Meds: . Chlorhexidine Gluconate Cloth  6 each Topical Daily  . enoxaparin (LOVENOX) injection  30 mg Subcutaneous Q24H  . feeding supplement  237 mL Oral BID BM  . hydroxyurea  1,000 mg Oral Daily  . insulin aspart  0-9 Units Subcutaneous Q4H  . insulin glargine  25 Units Subcutaneous QHS  . multivitamin with minerals  1 tablet Oral Daily  . sodium chloride flush  10-40 mL Intracatheter Q12H  . sodium chloride flush  3 mL Intravenous Q12H  . sodium chloride flush  3 mL Intravenous Q12H   Continuous Infusions: . sodium chloride    . sodium chloride     PRN Meds: sodium chloride, sodium chloride, acetaminophen, guaiFENesin-dextromethorphan, ondansetron (ZOFRAN) IV, ondansetron (ZOFRAN) IV, sodium chloride flush, sodium chloride flush, sodium chloride flush   Vital Signs    Vitals:   05/29/20 2004 05/29/20 2151 05/30/20 0508 05/30/20 0511  BP: 93/60 95/67 103/68   Pulse: (!) 107 (!) 107 (!) 117   Resp: 20 20 20    Temp: 98.7 F (37.1 C) 98.5 F (36.9 C) 98.2 F (36.8 C)   TempSrc: Oral Oral Oral   SpO2: 96% 100% 100%   Weight:    63.8 kg  Height:        Intake/Output Summary (Last 24 hours) at 05/30/2020 1036 Last data filed at 05/30/2020 0859 Gross per 24 hour  Intake 580 ml  Output 1250 ml  Net -670 ml   Filed Weights   05/29/20 0500 05/29/20 1229 05/30/20 0511  Weight: 63.4 kg 63.4 kg 63.8 kg    Telemetry    Sinus tachycardia- Personally Reviewed  ECG    No new - Personally Reviewed  Physical Exam   GEN: No acute distress.   Neck:  JVD with prominent V wave Cardiac:  regular tachycardia, no murmurs, rubs, or gallops.  Respiratory: Clear to auscultation bilaterally with tachypnea GI: Soft, nontenderly distended with fluid wave and hernia MS: Non pitting edema; No deformity. Neuro:  Nonfocal  Psych: Normal affect   Labs    Chemistry Recent Labs  Lab 05/27/20 1700 05/28/20 0221 05/29/20 0615 05/29/20 1129 05/29/20 1925 05/29/20 1935 05/29/20 2200 05/30/20 0541  NA 132* 132* 130* 130* 137  138 141  --  130*  K 5.1 4.9 5.4* 5.2* 5.3*  5.3* 4.8  --  5.6*  CL 107 107 104 105  --   --   --  104  CO2 15* 17* 18* 18*  --   --   --  18*  GLUCOSE 202* 170* 139* 168*  --   --   --  140*  BUN 55* 50* 59* 60*  --   --   --  69*  CREATININE 1.49* 1.63* 2.41* 2.44*  --   --  2.49* 2.53*  CALCIUM 8.5* 8.5* 8.0* 8.0*  --   --   --  8.2*  PROT 9.4* 9.5*  --  8.8*  --   --   --   --   ALBUMIN 3.2* 2.8*  --  2.7*  --   --   --   --  AST 98* 89*  --  96*  --   --   --   --   ALT 88* 85*  --  82*  --   --   --   --   ALKPHOS 118 106  --  106  --   --   --   --   BILITOT 5.0* 5.6*  --  6.4*  --   --   --   --   GFRNONAA 48* 43* 27* 26*  --   --  26* 25*  ANIONGAP --   --   --  8     Hematology Recent Labs  Lab 05/28/20 0221 05/29/20 1925 05/29/20 1935 05/29/20 2200 05/30/20 0541  WBC 12.5*  --   --  13.8* 16.1*  RBC 1.62*  --   --  1.52* 1.53*  HGB 7.4*   < > 7.1* 6.8* 6.8*  HCT 19.9*   < > 21.0* 18.6* 18.6*  MCV 122.8*  --   --  122.4* 121.6*  MCH 45.7*  --   --  44.7* 44.4*  MCHC 37.2*  --   --  36.6* 36.6*  RDW 17.2*  --   --  16.9* 17.2*  PLT 194  --   --  183 184   < > = values in this interval not displayed.    Cardiac EnzymesNo results for input(s): TROPONINI in the last 168 hours. No results for input(s): TROPIPOC in the last 168 hours.   BNP Recent Labs  Lab 05/27/20 1733 05/29/20 0615  BNP 1,228.5* 3,195.8*     DDimer No results for input(s): DDIMER in the last 168 hours.   Radiology    CARDIAC  CATHETERIZATION  Result Date: 05/29/2020 Findings: RA = 23 RV = 34/20 PA = 35/14 (26) PCW = 16 Fick cardiac output/index = 9.1/5.8 Thermo CO/CI = 6.9/4.3 PVR = 1.1 (Fick) 1.5 (Thermo) Ao sat = 96% PA sat = 66%, 68% High SVC sat = 73% PAPi = 0.9 Assessment: 1. Very mild PAH 2. Severe RV failure however cardiac output preserved 3. No evidence of intracardiac shunt Plan/Discussion: Suspect she has near end-stage RV failure due to longstanding PAH. Continue medical therapy. Given normal cardiac output, not sure there is any role for inotropic support at this time. Arvilla Meres, MD 7:49 PM   ECHOCARDIOGRAM COMPLETE  Result Date: 05/28/2020    ECHOCARDIOGRAM REPORT   Patient Name:   REAGHAN KAWA Date of Exam: 05/28/2020 Medical Rec #:  [id number]     Height:       59.0 in Accession #:    [id number]    Weight:       138.7 lb Date of Birth:  [birth year]     BSA:          1.579 m Patient Age:    35 years      BP:           101/64 mmHg Patient Gender: F             HR:           108 bpm. Exam Location:  Inpatient Procedure: 2D Echo, 3D Echo, Cardiac Doppler and Color Doppler Indications:    I51.7 Cardiomegaly  History:        Patient has prior history of Echocardiogram examinations, most                 recent 08/31/2018. Abnormal ECG, Stroke and Pulmonary HTN;  Risk                 Factors:Hypertension and Diabetes. Sickle cell.  Sonographer:    Sheralyn Boatman RDCS Referring Phys: [id number] ANASTASSIA DOUTOVA IMPRESSIONS  1. 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 The leaflets are fixed and appear immobile. Leaflet immobility is out of proportion to annular dilation. Would consider primary leaflet pathology such as carcinoid involvement in the work-up in addition to the patient's long history of severe pulmonary hypertension. The tricuspid valve is abnormal. Tricuspid valve regurgitation is severe.  2. RVSP is severely underestimated in the setting of severe tricuspid regurgitation. Right ventricular systolic  function is severely reduced. The right ventricular size is severely enlarged. There is normal pulmonary artery systolic pressure. The estimated right ventricular systolic pressure is 34.5 mmHg.  3. Left ventricular ejection fraction, by estimation, is 45 to 50%. Left ventricular ejection fraction by 3D volume is 45 %. The left ventricle has mildly decreased function. The left ventricle demonstrates global hypokinesis. Indeterminate diastolic filling due to E-A fusion. There is the interventricular septum is flattened in systole and diastole, consistent with right ventricular pressure and volume overload.  4. Right atrial size was severely dilated.  5. A small pericardial effusion is present. The pericardial effusion is circumferential.  6. The mitral valve is grossly normal. Mild to moderate mitral valve regurgitation. No evidence of mitral stenosis.  7. The aortic valve is tricuspid. Aortic valve regurgitation is not visualized. No aortic stenosis is present.  8. The inferior vena cava is dilated in size with <50% respiratory variability, suggesting right atrial pressure of 15 mmHg. Comparison(s): Changes from prior study are noted. RV is severely dilated with severely reduced function. Severe torrential TR is present. LVEF is mildly reduced 45-50%. RVSP underestimated in setting of severe TR. FINDINGS  Left Ventricle: Left ventricular ejection fraction, by estimation, is 45 to 50%. Left ventricular ejection fraction by 3D volume is 45 %. The left ventricle has mildly decreased function. The left ventricle demonstrates global hypokinesis. The left ventricular internal cavity size was normal in size. There is no left ventricular hypertrophy. The interventricular septum is flattened in systole and diastole, consistent with right ventricular pressure and volume overload. Indeterminate diastolic filling due to E-A fusion. Right Ventricle: RVSP is severely underestimated in the setting of severe tricuspid  regurgitation. The right ventricular size is severely enlarged. No increase in right ventricular wall thickness. Right ventricular systolic function is severely reduced. There is normal pulmonary artery systolic pressure. The tricuspid regurgitant velocity is 2.21 m/s, and with an assumed right atrial pressure of 15 mmHg, the estimated right ventricular systolic pressure is 34.5 mmHg. Left Atrium: Left atrial size was normal in size. Right Atrium: Right atrial size was severely dilated. Pericardium: A small pericardial effusion is present. The pericardial effusion is circumferential. Mitral Valve: The mitral valve is grossly normal. Mild to moderate mitral valve regurgitation. No evidence of mitral valve stenosis. MV peak gradient, 14.0 mmHg. The mean mitral valve gradient is 4.0 mmHg. Tricuspid Valve: There is severe, torrential tricuspid regurgitation. The leaflets are fixed and appear immobile. Leaflet immobility is out of proportion to annular dilation. Would consider primary leaflet pathology such as carcinoid involvement in the work-up in addition to the patient's long history of severe pulmonary hypertension. The tricuspid valve is abnormal. Tricuspid valve regurgitation is severe. No evidence of tricuspid stenosis. The flow in the hepatic veins is reversed during ventricular systole. Aortic Valve: The aortic valve is tricuspid. Aortic valve regurgitation is  not visualized. No aortic stenosis is present. Pulmonic Valve: The pulmonic valve was grossly normal. Pulmonic valve regurgitation is mild. No evidence of pulmonic stenosis. Aorta: The aortic root and ascending aorta are structurally normal, with no evidence of dilitation. Venous: The inferior vena cava is dilated in size with less than 50% respiratory variability, suggesting right atrial pressure of 15 mmHg. IAS/Shunts: There is left bowing of the interatrial septum, suggestive of elevated right atrial pressure. The atrial septum is grossly normal.  LEFT  VENTRICLE PLAX 2D LVIDd:         4.80 cm         Diastology LVIDs:         3.60 cm         LV e' medial:    9.25 cm/s LV PW:         1.15 cm         LV E/e' medial:  14.9 LV IVS:        0.90 cm         LV e' lateral:   11.40 cm/s LVOT diam:     1.80 cm         LV E/e' lateral: 12.1 LV SV:         56 LV SV Index:   36 LVOT Area:     2.54 cm        3D Volume EF                                LV 3D EF:    Left                                             ventricular LV Volumes (MOD)                            ejection LV vol d, MOD    77.2 ml                    fraction by A2C:                                        3D volume LV vol d, MOD    98.4 ml                    is 45 %. A4C: LV vol s, MOD    36.1 ml A2C:                           3D Volume EF: LV vol s, MOD    59.1 ml       3D EF:        45 % A4C: LV SV MOD A2C:   41.1 ml LV SV MOD A4C:   98.4 ml LV SV MOD BP:    43.2 ml RIGHT VENTRICLE             IVC RV S prime:     10.90 cm/s  IVC diam: 3.00 cm TAPSE (M-mode): 1.9 cm LEFT ATRIUM           Index  RIGHT ATRIUM           Index LA diam:      4.00 cm 2.53 cm/m  RA Area:     25.80 cm LA Vol (A2C): 56.3 ml 35.66 ml/m RA Volume:   102.00 ml 64.61 ml/m LA Vol (A4C): 39.5 ml 25.02 ml/m  AORTIC VALVE              PULMONIC VALVE LVOT Vmax:   134.00 cm/s  PR End Diast Vel: 2.37 msec LVOT Vmean:  104.000 cm/s LVOT VTI:    0.222 m  AORTA Ao Root diam: 2.70 cm Ao Asc diam:  3.30 cm MITRAL VALVE                 TRICUSPID VALVE MV Area (PHT): 5.84 cm      TR Peak grad:   19.5 mmHg MV Area VTI:   1.70 cm      TR Vmax:        221.00 cm/s MV Peak grad:  14.0 mmHg MV Mean grad:  4.0 mmHg      SHUNTS MV Vmax:       1.87 m/s      Systemic VTI:  0.22 m MV Vmean:      81.7 cm/s     Systemic Diam: 1.80 cm MV Decel Time: 130 msec MR Peak grad:    83.7 mmHg MR Mean grad:    56.0 mmHg MR Vmax:         457.50 cm/s MR Vmean:        356.5 cm/s MR PISA:         1.01 cm MR PISA Eff ROA: 9 mm MR PISA Radius:  0.40 cm MV E  velocity: 138.00 cm/s Lennie Odor MD Electronically signed by Lennie Odor MD Signature Date/Time: 05/28/2020/2:05:27 PM    Final     Cardiac Studies   Left/Right Heart Catheterizations: Date: 05/29/20 Results: RA = 23 RV = 34/20 PA = 35/14 (26)  PCW = 16 TPG 10  Fick cardiac output/index = 9.1/5.8 Thermo CO/CI = 6.9/4.3 PVR = 1.1 (Fick) 1.5 (Thermo) Ao sat = 96% PA sat = 66%, 68% High SVC sat = 73% PAPi = 0.9 Right sided Volume overload; cannot rule out RV in burn out phase   Patient Profile     35 y.o. 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, hypervolemic, WHO V in the setting of Sickle Cell and likely PAH PH phenotype with OSA, Mild LV dysfunction - Diuretic regimen: planned for IV access (Discussed with AHF, does not need PICC) will start milrinone 0.125 mcg (order placed) and PM torsemide 20 mg PO  - Fluid restriction of < 2 L  - No history of DVT or PE to suggest chronic thromboembolic disease; if patient improves will need V/Q through hospital course  - :  unable to presently - No signs or symptoms of connective tissue disease and no family history of the CT or PH  - No weight loss drug use  - No methamphetamine or other illicit substance use - Last RHC: 05/29/20 - Pulm/AHF: WF- [facility] presently - PDEi: None at this time - NO or equivalent: future (would need transfer to [facility] center) - ERA: future - AHF  team following; appreciate recs  Transaminitis - continue to trend (ordered)  SVT  - presently SR - no AV nodal agents given RV dysfunction  DM - per primary  Discussed at length with patient including guarded prognosis if no decongestion with inodilation; Discussed with primary MD and AHF MD  For questions or updates, please  contact [facility] Please consult [email or web address] for contact info under Cardiology/STEMI.      Riley LamMahesh Chadric Kimberley, MD Cardiologist [facility]  [facility]  [address] [place], [place] [address] [phone]  10:36 AM

## 2020-05-30 NOTE — Progress Notes (Addendum)
Pt c/o 5/10 left neck soreness/pain. Pt had heart cath yesterday with vascular site in the left IJ. Site level 0/ unremarkable. MD paged.    Hgb = 6.8. MD made aware. Will continue to monitor.

## 2020-05-30 NOTE — Progress Notes (Signed)
PROGRESS NOTE    Pamela Hudson  SNK:[id number] DOB: Jul 20, 1985 DOA: 05/27/2020 PCP: Judd Lien, PA-C    Brief Narrative:  Pamela Hudson is a 35 y.o. female with medical history significant of cardiomegaly, CHF, chronic pain, CVA, HTN, PCOS, sickle cell anemia, DM2, chronic right heart strain, pulmonary Hypertension, CKD stage 3, HTN, transient A.fib COVID 2020    Presented with   Gradual leg swelling patient with CHF, chronic pain.  Patient was concerned that she was volume overloaded.  She has been having also worsening shortness of breath for the past few days to weeks.  States she has been taking her medication Reports up to 5 pound weight gain over the past 3 days  3/17- doe when walking the stairs. Feels close to baseline? Plan for [facility] cath today 3/18-s/p RHC on 3/17. Plan to start milrinone      Consultants:   Cardiology  Procedures:   Antimicrobials:       Subjective: Has no complaints. No sob , just doe. Lying in bed on her side.   Objective: Vitals:   05/29/20 2004 05/29/20 2151 05/30/20 0508 05/30/20 0511  BP: 93/60 95/67 103/68   Pulse: (!) 107 (!) 107 (!) 117   Resp: 20 20 20    Temp: 98.7 F (37.1 C) 98.5 F (36.9 C) 98.2 F (36.8 C)   TempSrc: Oral Oral Oral   SpO2: 96% 100% 100%   Weight:    63.8 kg  Height:        Intake/Output Summary (Last 24 hours) at 05/30/2020 0815 Last data filed at 05/30/2020 0500 Gross per 24 hour  Intake 460 ml  Output 1250 ml  Net -790 ml   Filed Weights   05/29/20 0500 05/29/20 1229 05/30/20 0511  Weight: 63.4 kg 63.4 kg 63.8 kg    Examination: Calm, comfortable Positive Rales at bases, no wheezing Regular mildly tacky S1-S2 no gallops Soft benign positive bowel sounds mildly distended No edema AAOxO x3    Data Reviewed: I have personally reviewed following labs and imaging studies  CBC: Recent Labs  Lab 05/27/20 1604 05/28/20 0221 05/29/20 1925 05/29/20 1935 05/29/20 2200 05/30/20 0541   WBC 12.8* 12.5*  --   --  13.8* 16.1*  NEUTROABS 7.9* 7.5  --   --   --   --   HGB 7.8* 7.4* 7.8*  7.8* 7.1* 6.8* 6.8*  HCT 20.9* 19.9* 23.0*  23.0* 21.0* 18.6* 18.6*  MCV 122.2* 122.8*  --   --  122.4* 121.6*  PLT 222 194  --   --  183 184   Basic Metabolic Panel: Recent Labs  Lab 05/27/20 1700 05/28/20 0221 05/29/20 0615 05/29/20 1129 05/29/20 1925 05/29/20 1935 05/29/20 2200 05/30/20 0541  NA 132* 132* 130* 130* 137  138 141  --  130*  K 5.1 4.9 5.4* 5.2* 5.3*  5.3* 4.8  --  5.6*  CL 107 107 104 105  --   --   --  104  CO2 15* 17* 18* 18*  --   --   --  18*  GLUCOSE 202* 170* 139* 168*  --   --   --  140*  BUN 55* 50* 59* 60*  --   --   --  69*  CREATININE 1.49* 1.63* 2.41* 2.44*  --   --  2.49* 2.53*  CALCIUM 8.5* 8.5* 8.0* 8.0*  --   --   --  8.2*  MG  --  2.4  --   --   --   --   --   --  PHOS  --  4.5  --   --   --   --   --   --    GFR: Estimated Creatinine Clearance: 25.9 mL/min (A) (by C-G formula based on SCr of 2.53 mg/dL (H)). Liver Function Tests: Recent Labs  Lab 05/27/20 1700 05/28/20 0221 05/29/20 1129  AST 98* 89* 96*  ALT 88* 85* 82*  ALKPHOS 118 106 106  BILITOT 5.0* 5.6* 6.4*  PROT 9.4* 9.5* 8.8*  ALBUMIN 3.2* 2.8* 2.7*   Recent Labs  Lab 05/27/20 1700  LIPASE 30   No results for input(s): AMMONIA in the last 168 hours. Coagulation Profile: Recent Labs  Lab 05/28/20 0221  INR 1.4*   Cardiac Enzymes: No results for input(s): CKTOTAL, CKMB, CKMBINDEX, TROPONINI in the last 168 hours. BNP (last 3 results) No results for input(s): PROBNP in the last 8760 hours. HbA1C: Recent Labs    05/28/20 0221  HGBA1C 5.2   CBG: Recent Labs  Lab 05/29/20 1547 05/29/20 2058 05/29/20 2340 05/30/20 0347 05/30/20 0718  GLUCAP 139* 88 137* 130* 109*   Lipid Profile: No results for input(s): CHOL, HDL, LDLCALC, TRIG, CHOLHDL, LDLDIRECT in the last 72 hours. Thyroid Function Tests: Recent Labs    05/27/20 2251 05/28/20 0221  TSH   --  5.929*  FREET4 1.27*  --    Anemia Panel: Recent Labs    05/27/20 1604  RETICCTPCT 7.6*   Sepsis Labs: No results for input(s): PROCALCITON, LATICACIDVEN in the last 168 hours.  Recent Results (from the past 240 hour(s))  Resp Panel by RT-PCR (Flu A&B, Covid) Nasopharyngeal Swab     Status: None   Collection Time: 05/27/20  4:26 PM   Specimen: Nasopharyngeal Swab; Nasopharyngeal(NP) swabs in vial transport medium  Result Value Ref Range Status   SARS Coronavirus 2 by RT PCR NEGATIVE NEGATIVE Final    Comment: (NOTE) SARS-CoV-2 target nucleic acids are NOT DETECTED.  The SARS-CoV-2 RNA is generally detectable in upper respiratory specimens during the acute phase of infection. The lowest concentration of SARS-CoV-2 viral copies this assay can detect is 138 copies/mL. A negative result does not preclude SARS-Cov-2 infection and should not be used as the sole basis for treatment or other patient management decisions. A negative result may occur with  improper specimen collection/handling, submission of specimen other than nasopharyngeal swab, presence of viral mutation(s) within the areas targeted by this assay, and inadequate number of viral copies(<138 copies/mL). A negative result must be combined with clinical observations, patient history, and epidemiological information. The expected result is Negative.  Fact Sheet for Patients:  BloggerCourse.com  Fact Sheet for Healthcare Providers:  SeriousBroker.it  This test is no t yet approved or cleared by the [place] FDA and  has been authorized for detection and/or diagnosis of SARS-CoV-2 by FDA under an Emergency Use Authorization (EUA). This EUA will remain  in effect (meaning this test can be used) for the duration of the COVID-19 declaration under Section 564(b)(1) of the Act, 21 U.S.C.section 360bbb-3(b)(1), unless the authorization is terminated  or revoked  sooner.       Influenza A by PCR NEGATIVE NEGATIVE Final   Influenza B by PCR NEGATIVE NEGATIVE Final    Comment: (NOTE) The Xpert Xpress SARS-CoV-2/FLU/RSV plus assay is intended as an aid in the diagnosis of influenza from Nasopharyngeal swab specimens and should not be used as a sole basis for treatment. Nasal washings and aspirates are unacceptable for Xpert Xpress SARS-CoV-2/FLU/RSV testing.  Fact Sheet for  Patients: BloggerCourse.com  Fact Sheet for Healthcare Providers: SeriousBroker.it  This test is not yet approved or cleared by the [place] FDA and has been authorized for detection and/or diagnosis of SARS-CoV-2 by FDA under an Emergency Use Authorization (EUA). This EUA will remain in effect (meaning this test can be used) for the duration of the COVID-19 declaration under Section 564(b)(1) of the Act, 21 U.S.C. section 360bbb-3(b)(1), unless the authorization is terminated or revoked.  Performed at [facility], [address] [place], [place] [address]   Culture, blood (routine x 2)     Status: None (Preliminary result)   Collection Time: 05/28/20 10:45 PM   Specimen: BLOOD RIGHT HAND  Result Value Ref Range Status   Specimen Description BLOOD RIGHT HAND  Final   Special Requests   Final    BOTTLES DRAWN AEROBIC ONLY Blood Culture adequate volume   Culture  Setup Time   Final    GRAM POSITIVE COCCI IN CLUSTERS AEROBIC BOTTLE ONLY CRITICAL VALUE NOTED.  VALUE IS CONSISTENT WITH PREVIOUSLY REPORTED AND CALLED VALUE. Performed at [facility], [address] [place], [place] [address]    Culture GRAM POSITIVE COCCI  Final   Report Status PENDING  Incomplete  Culture, blood (routine x 2)     Status: None (Preliminary result)   Collection Time: 05/28/20 10:45 PM   Specimen: BLOOD LEFT HAND  Result Value Ref Range Status   Specimen Description BLOOD LEFT HAND  Final   Special Requests    Final    BOTTLES DRAWN AEROBIC ONLY Blood Culture results may not be optimal due to an inadequate volume of blood received in culture bottles   Culture  Setup Time   Final    GRAM POSITIVE COCCI IN CLUSTERS AEROBIC BOTTLE ONLY Organism ID to follow CRITICAL RESULT CALLED TO, READ BACK BY AND VERIFIED WITH: C. AMEND PHARMD, AT 0550 05/30/20 BY D VANHOOK Performed at [facility], [address], [place], [place] [address]    Culture GRAM POSITIVE COCCI  Final   Report Status PENDING  Incomplete  Blood Culture ID Panel (Reflexed)     Status: Abnormal   Collection Time: 05/28/20 10:45 PM  Result Value Ref Range Status   Enterococcus faecalis NOT DETECTED NOT DETECTED Final   Enterococcus Faecium NOT DETECTED NOT DETECTED Final   Listeria monocytogenes NOT DETECTED NOT DETECTED Final   Staphylococcus species DETECTED (A) NOT DETECTED Final    Comment: CRITICAL RESULT CALLED TO, READ BACK BY AND VERIFIED WITH: C. AMEND PHARMD, AT 0550 05/30/20 BY D VANHOOK    Staphylococcus aureus (BCID) NOT DETECTED NOT DETECTED Final   Staphylococcus epidermidis NOT DETECTED NOT DETECTED Final   Staphylococcus lugdunensis NOT DETECTED NOT DETECTED Final   Streptococcus species NOT DETECTED NOT DETECTED Final   Streptococcus agalactiae NOT DETECTED NOT DETECTED Final   Streptococcus pneumoniae NOT DETECTED NOT DETECTED Final   Streptococcus pyogenes NOT DETECTED NOT DETECTED Final   A.calcoaceticus-baumannii NOT DETECTED NOT DETECTED Final   Bacteroides fragilis NOT DETECTED NOT DETECTED Final   Enterobacterales NOT DETECTED NOT DETECTED Final   Enterobacter cloacae complex NOT DETECTED NOT DETECTED Final   Escherichia coli NOT DETECTED NOT DETECTED Final   Klebsiella aerogenes NOT DETECTED NOT DETECTED Final   Klebsiella oxytoca NOT DETECTED NOT DETECTED Final   Klebsiella pneumoniae NOT DETECTED NOT DETECTED Final   Proteus species NOT DETECTED NOT DETECTED Final   Salmonella species NOT  DETECTED NOT DETECTED  Final   Serratia marcescens NOT DETECTED NOT DETECTED Final   Haemophilus influenzae NOT DETECTED NOT DETECTED Final   Neisseria meningitidis NOT DETECTED NOT DETECTED Final   Pseudomonas aeruginosa NOT DETECTED NOT DETECTED Final   Stenotrophomonas maltophilia NOT DETECTED NOT DETECTED Final   Candida albicans NOT DETECTED NOT DETECTED Final   Candida auris NOT DETECTED NOT DETECTED Final   Candida glabrata NOT DETECTED NOT DETECTED Final   Candida krusei NOT DETECTED NOT DETECTED Final   Candida parapsilosis NOT DETECTED NOT DETECTED Final   Candida tropicalis NOT DETECTED NOT DETECTED Final   Cryptococcus neoformans/gattii NOT DETECTED NOT DETECTED Final    Comment: Performed at [facility], [address] [place], [place] [address]         Radiology Studies: CARDIAC CATHETERIZATION  Result Date: 05/29/2020 Findings: RA = 23 RV = 34/20 PA = 35/14 (26) PCW = 16 Fick cardiac output/index = 9.1/5.8 Thermo CO/CI = 6.9/4.3 PVR = 1.1 (Fick) 1.5 (Thermo) Ao sat = 96% PA sat = 66%, 68% High SVC sat = 73% PAPi = 0.9 Assessment: 1. Very mild PAH 2. Severe RV failure however cardiac output preserved 3. No evidence of intracardiac shunt Plan/Discussion: Suspect she has near end-stage RV failure due to longstanding PAH. Continue medical therapy. Given normal cardiac output, not sure there is any role for inotropic support at this time. Arvilla Meres, MD 7:49 PM   ECHOCARDIOGRAM COMPLETE  Result Date: 05/28/2020    ECHOCARDIOGRAM REPORT   Patient Name:   Pamela Hudson Date of Exam: 05/28/2020 Medical Rec #:  [id number]     Height:       59.0 in Accession #:    [id number]    Weight:       138.7 lb Date of Birth:  05-Jun-1985     BSA:          1.579 m Patient Age:    34 years      BP:           101/64 mmHg Patient Gender: F             HR:           108 bpm. Exam Location:  Inpatient Procedure: 2D Echo, 3D Echo, Cardiac Doppler and Color Doppler Indications:    I51.7  Cardiomegaly  History:        Patient has prior history of Echocardiogram examinations, most                 recent 08/31/2018. Abnormal ECG, Stroke and Pulmonary HTN; Risk                 Factors:Hypertension and Diabetes. Sickle cell.  Sonographer:    Sheralyn Boatman RDCS Referring Phys: [id number] ANASTASSIA DOUTOVA IMPRESSIONS  1. 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 The leaflets are fixed and appear immobile. Leaflet immobility is out of proportion to annular dilation. Would consider primary leaflet pathology such as carcinoid involvement in the work-up in addition to the patient's long history of severe pulmonary hypertension. The tricuspid valve is abnormal. Tricuspid valve regurgitation is severe.  2. RVSP is severely underestimated in the setting of severe tricuspid regurgitation. Right ventricular systolic function is severely reduced. The right ventricular size is severely enlarged. There is normal pulmonary artery systolic pressure. The estimated right ventricular systolic pressure is 34.5 mmHg.  3. Left ventricular ejection fraction, by estimation, is 45 to 50%. Left ventricular ejection fraction by 3D volume is 45 %. The left  ventricle has mildly decreased function. The left ventricle demonstrates global hypokinesis. Indeterminate diastolic filling due to E-A fusion. There is the interventricular septum is flattened in systole and diastole, consistent with right ventricular pressure and volume overload.  4. Right atrial size was severely dilated.  5. A small pericardial effusion is present. The pericardial effusion is circumferential.  6. The mitral valve is grossly normal. Mild to moderate mitral valve regurgitation. No evidence of mitral stenosis.  7. The aortic valve is tricuspid. Aortic valve regurgitation is not visualized. No aortic stenosis is present.  8. The inferior vena cava is dilated in size with <50% respiratory variability, suggesting right atrial pressure of 15 mmHg. Comparison(s):  Changes from prior study are noted. RV is severely dilated with severely reduced function. Severe torrential TR is present. LVEF is mildly reduced 45-50%. RVSP underestimated in setting of severe TR. FINDINGS  Left Ventricle: Left ventricular ejection fraction, by estimation, is 45 to 50%. Left ventricular ejection fraction by 3D volume is 45 %. The left ventricle has mildly decreased function. The left ventricle demonstrates global hypokinesis. The left ventricular internal cavity size was normal in size. There is no left ventricular hypertrophy. The interventricular septum is flattened in systole and diastole, consistent with right ventricular pressure and volume overload. Indeterminate diastolic filling due to E-A fusion. Right Ventricle: RVSP is severely underestimated in the setting of severe tricuspid regurgitation. The right ventricular size is severely enlarged. No increase in right ventricular wall thickness. Right ventricular systolic function is severely reduced. There is normal pulmonary artery systolic pressure. The tricuspid regurgitant velocity is 2.21 m/s, and with an assumed right atrial pressure of 15 mmHg, the estimated right ventricular systolic pressure is 34.5 mmHg. Left Atrium: Left atrial size was normal in size. Right Atrium: Right atrial size was severely dilated. Pericardium: A small pericardial effusion is present. The pericardial effusion is circumferential. Mitral Valve: The mitral valve is grossly normal. Mild to moderate mitral valve regurgitation. No evidence of mitral valve stenosis. MV peak gradient, 14.0 mmHg. The mean mitral valve gradient is 4.0 mmHg. Tricuspid Valve: There is severe, torrential tricuspid regurgitation. The leaflets are fixed and appear immobile. Leaflet immobility is out of proportion to annular dilation. Would consider primary leaflet pathology such as carcinoid involvement in the work-up in addition to the patient's long history of severe pulmonary  hypertension. The tricuspid valve is abnormal. Tricuspid valve regurgitation is severe. No evidence of tricuspid stenosis. The flow in the hepatic veins is reversed during ventricular systole. Aortic Valve: The aortic valve is tricuspid. Aortic valve regurgitation is not visualized. No aortic stenosis is present. Pulmonic Valve: The pulmonic valve was grossly normal. Pulmonic valve regurgitation is mild. No evidence of pulmonic stenosis. Aorta: The aortic root and ascending aorta are structurally normal, with no evidence of dilitation. Venous: The inferior vena cava is dilated in size with less than 50% respiratory variability, suggesting right atrial pressure of 15 mmHg. IAS/Shunts: There is left bowing of the interatrial septum, suggestive of elevated right atrial pressure. The atrial septum is grossly normal.  LEFT VENTRICLE PLAX 2D LVIDd:         4.80 cm         Diastology LVIDs:         3.60 cm         LV e' medial:    9.25 cm/s LV PW:         1.15 cm         LV E/e' medial:  14.9  LV IVS:        0.90 cm         LV e' lateral:   11.40 cm/s LVOT diam:     1.80 cm         LV E/e' lateral: 12.1 LV SV:         56 LV SV Index:   36 LVOT Area:     2.54 cm        3D Volume EF                                LV 3D EF:    Left                                             ventricular LV Volumes (MOD)                            ejection LV vol d, MOD    77.2 ml                    fraction by A2C:                                        3D volume LV vol d, MOD    98.4 ml                    is 45 %. A4C: LV vol s, MOD    36.1 ml A2C:                           3D Volume EF: LV vol s, MOD    59.1 ml       3D EF:        45 % A4C: LV SV MOD A2C:   41.1 ml LV SV MOD A4C:   98.4 ml LV SV MOD BP:    43.2 ml RIGHT VENTRICLE             IVC RV S prime:     10.90 cm/s  IVC diam: 3.00 cm TAPSE (M-mode): 1.9 cm LEFT ATRIUM           Index       RIGHT ATRIUM           Index LA diam:      4.00 cm 2.53 cm/m  RA Area:     25.80 cm LA Vol  (A2C): 56.3 ml 35.66 ml/m RA Volume:   102.00 ml 64.61 ml/m LA Vol (A4C): 39.5 ml 25.02 ml/m  AORTIC VALVE              PULMONIC VALVE LVOT Vmax:   134.00 cm/s  PR End Diast Vel: 2.37 msec LVOT Vmean:  104.000 cm/s LVOT VTI:    0.222 m  AORTA Ao Root diam: 2.70 cm Ao Asc diam:  3.30 cm MITRAL VALVE                 TRICUSPID VALVE MV Area (PHT): 5.84 cm      TR Peak grad:   19.5 mmHg MV Area VTI:   1.70 cm  TR Vmax:        221.00 cm/s MV Peak grad:  14.0 mmHg MV Mean grad:  4.0 mmHg      SHUNTS MV Vmax:       1.87 m/s      Systemic VTI:  0.22 m MV Vmean:      81.7 cm/s     Systemic Diam: 1.80 cm MV Decel Time: 130 msec MR Peak grad:    83.7 mmHg MR Mean grad:    56.0 mmHg MR Vmax:         457.50 cm/s MR Vmean:        356.5 cm/s MR PISA:         1.01 cm MR PISA Eff ROA: 9 mm MR PISA Radius:  0.40 cm MV E velocity: 138.00 cm/s Lennie Odor MD Electronically signed by Lennie Odor MD Signature Date/Time: 05/28/2020/2:05:27 PM    Final         Scheduled Meds: . Chlorhexidine Gluconate Cloth  6 each Topical Daily  . enoxaparin (LOVENOX) injection  30 mg Subcutaneous Q24H  . feeding supplement  237 mL Oral BID BM  . hydroxyurea  1,000 mg Oral Daily  . insulin aspart  0-9 Units Subcutaneous Q4H  . insulin glargine  25 Units Subcutaneous QHS  . multivitamin with minerals  1 tablet Oral Daily  . sodium chloride flush  10-40 mL Intracatheter Q12H  . sodium chloride flush  3 mL Intravenous Q12H  . sodium chloride flush  3 mL Intravenous Q12H  . sodium polystyrene  30 g Oral Once   Continuous Infusions: . sodium chloride    . sodium chloride      Assessment & Plan:   Active Problems:   Sickle cell anemia (HCC)   Elevated bilirubin   Leukocytosis   Dyspnea   Acute on chronic right-sided heart failure (HCC)   Chronic right-sided heart failure (HCC)   Pulmonary hypertension (HCC)   CKD (chronic kidney disease) stage 3, GFR 30-59 ml/min (HCC)   DM2 (diabetes mellitus, type 2)  (HCC)   35 y.o. female with medical history significant of cardiomegaly, CHF, chronic pain, CVA, HTN, PCOS, sickle cell anemia, DM2, chronic right heart strain, pulmonary Hypertension, CKD stage 3, HTN, transient A.fib Admitted for acute on chronic Right heart failure  Present on Admission: Acute biventricular HF Echo 45 to 50%.  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 Very mild PAH, 2. Severe RV failure however cardiac output preserved, 3. No evidence of intracardiac shunt  Continue Lasix IV Getting started on milrinone drip Cardiology following     . Sickle cell anemia (HCC)  Hg 6.8 Continue hyroxyurea Consulted hematology to see pt , Dr. Debroah Baller will see as to when is appropriate for transfusion    Fever- Afebrile this a.m. WBC going up Urine culture pending Blood culture from 9/16+ for staph repeat cultures are pending We will start IV antibiotics empirically for now and will follow up final cultures  . Pulmonary hypertension (HCC) - Cardiology following WHO functional class III stage C 3/18-s/p RHC with results above. No h/xo DVT or PE to suggest chronic thromboembolic disease; if patient improves will need V/Q to hospital course Fluid restriction less than 2 L  . Leukocytosis - Chronic, however wbc going up Had fever yesterday bcx as above, will f/u  Hyperkalemia-K is 5.6 we will give Kayexalate    . CKD (chronic kidney disease) stage 3b Mildly up, cr 2.53 Possibly due to hypoperfusion from CHF  Avoid nephrotoxic meds Monitor closely with diuresis     . Elevated bilirubin  - chronic abnormal LFTs history of febrile hepatomegaly in the past Abdominal ultrasound with increased liver echotexture compatible with hepatic steatosis  Dm2-  - Order Sensitive  SSI  A1c 5.2 TSH 5.9129 and FT4 1.27, will need reck once acute medical issues stablized and reck levels in 4 weeks    DVT prophylaxis: Lovenox Code Status:  Full Family Communication: None at bedside  Status is: Inpatient  Remains inpatient appropriate because:IV treatments appropriate due to intensity of illness or inability to take PO   Dispo: The patient is from: Home              Anticipated d/c is to: Home              Patient currently is not medically stable to d/c.   Difficult to place patient No  Patient still receiving IV Lasix due to volume overload, starting IV milrinone          LOS: 3 days   Time spent: 45 minutes with more than 50% on COC    Lynn Ito, MD [facility] Pager [phone]  If 7PM-7AM, please contact night-coverage 05/30/2020, 8:15 AM

## 2020-05-30 NOTE — Progress Notes (Signed)
PHARMACY - PHYSICIAN COMMUNICATION CRITICAL VALUE ALERT - BLOOD CULTURE IDENTIFICATION (BCID)  Pamela Hudson is an 35 y.o. female who presented to [facility] on 05/27/2020 with a chief complaint of weakness  Assessment:  Pt with staph species in 1/4 blood culture bottles - likely contaminant   Name of physician (or Provider) Contacted: Dr. Carren Rang  Current antibiotics: None  Changes to prescribed antibiotics recommended:  No abx needed at this time  Results for orders placed or performed during the hospital encounter of 05/27/20  Blood Culture ID Panel (Reflexed) (Collected: 05/28/2020 10:45 PM)  Result Value Ref Range   Enterococcus faecalis NOT DETECTED NOT DETECTED   Enterococcus Faecium NOT DETECTED NOT DETECTED   Listeria monocytogenes NOT DETECTED NOT DETECTED   Staphylococcus species DETECTED (A) NOT DETECTED   Staphylococcus aureus (BCID) NOT DETECTED NOT DETECTED   Staphylococcus epidermidis NOT DETECTED NOT DETECTED   Staphylococcus lugdunensis NOT DETECTED NOT DETECTED   Streptococcus species NOT DETECTED NOT DETECTED   Streptococcus agalactiae NOT DETECTED NOT DETECTED   Streptococcus pneumoniae NOT DETECTED NOT DETECTED   Streptococcus pyogenes NOT DETECTED NOT DETECTED   A.calcoaceticus-baumannii NOT DETECTED NOT DETECTED   Bacteroides fragilis NOT DETECTED NOT DETECTED   Enterobacterales NOT DETECTED NOT DETECTED   Enterobacter cloacae complex NOT DETECTED NOT DETECTED   Escherichia coli NOT DETECTED NOT DETECTED   Klebsiella aerogenes NOT DETECTED NOT DETECTED   Klebsiella oxytoca NOT DETECTED NOT DETECTED   Klebsiella pneumoniae NOT DETECTED NOT DETECTED   Proteus species NOT DETECTED NOT DETECTED   Salmonella species NOT DETECTED NOT DETECTED   Serratia marcescens NOT DETECTED NOT DETECTED   Haemophilus influenzae NOT DETECTED NOT DETECTED   Neisseria meningitidis NOT DETECTED NOT DETECTED   Pseudomonas aeruginosa NOT DETECTED NOT DETECTED    Stenotrophomonas maltophilia NOT DETECTED NOT DETECTED   Candida albicans NOT DETECTED NOT DETECTED   Candida auris NOT DETECTED NOT DETECTED   Candida glabrata NOT DETECTED NOT DETECTED   Candida krusei NOT DETECTED NOT DETECTED   Candida parapsilosis NOT DETECTED NOT DETECTED   Candida tropicalis NOT DETECTED NOT DETECTED   Cryptococcus neoformans/gattii NOT DETECTED NOT DETECTED    Christoper Fabian, PharmD, BCPS Please see amion for complete clinical pharmacist phone list' 05/30/2020  5:55 AM

## 2020-05-30 NOTE — Consult Note (Addendum)
[name]  [phone] [phone]  ID: Pamela Hudson OB: Feb 06, 1986 MR#: [id number]#:[id number] PCP: Joella Prince OTHER MD: Dr. Jerrye Noble   CHIEF COMPLAINT: Anemia, sickle cell disease  INTERVAL HISTORY: Pamela Hudson is a 35 year old female from [place], [place].  She has a past medical history significant for cardiomegaly, CHF, chronic pain, hypertension, CVA, PCOS, sickle cell anemia, diabetes mellitus type 2, chronic right heart strain, pulmonary hypertension, CKD stage III, and transient A. Fib.  The patient presented to the emergency room with generalized weakness, gradual leg swelling, and chronic pain.  She was also having shortness of breath and a 5 pound weight gain over 3 days prior to admission.  The patient has been seen by cardiology this admission and started on IV diuresis.  She underwent a right heart cath on 05/29/2020 which showed very mild PAH, severe RV failure however cardiac output preserved, no evidence of intracardiac shunt.  REVIEW OF SYSTEMS: The patient reports ongoing edema but this has improved since admission.  She currently denies fevers, chills, chest pain, shortness of breath.  She has not noticed any bleeding.  She has been maintained on hydroxyurea 1000 mg daily, oxbryta 1500 mg daily, and folic acid 1 mg daily for sickle cell disease.  She is followed at [facility].  The patient tells me that she rarely receives blood transfusions.  States that her hemoglobin typically runs in the 6 range.  She only receives blood transfusions if her hemoglobin drops into the 5's or she is feeling very symptomatic.  The patient does not feel as though she needs a blood transfusion at this time.  The remainder of the review of systems is noncontributory.  PAST MEDICAL HISTORY: Past Medical History:  Diagnosis Date  . Atrial fibrillation ([place])   . Cardiomegaly   . CHF (congestive heart failure) ([place])   . Chronic kidney  disease (CKD), stage III (moderate) (HCC)   . Chronic pain   . COVID-19   . CVA (cerebral infarction) 02/2002  . Diabetes mellitus without complication ([place])   . Hypertension   . Liver disease    ?iron  . PCOS (polycystic ovarian syndrome)   . Renal stone   . Sickle cell anemia (HCC)   . Sleep apnea    PAST SURGICAL HISTORY: Past Surgical History:  Procedure Laterality Date  . CHOLECYSTECTOMY    . LIVER BIOPSY  2007  . PARATHYROIDECTOMY    . PORTACATH PLACEMENT    . RIGHT HEART CATH N/A 09/01/2018   Procedure: RIGHT HEART CATH;  Surgeon: Adrian Prows, MD;  Location: [facility];  Service: Cardiovascular;  Laterality: N/A;  . RIGHT HEART CATH N/A 05/29/2020   Procedure: RIGHT HEART CATH;  Surgeon: Jolaine Artist, MD;  Location: [facility];  Service: Cardiovascular;  Laterality: N/A;   FAMILY HISTORY Family History  Problem Relation Age of Onset  . Diabetes Mellitus II Father   . CAD Father   . Diabetes Mellitus II Paternal Grandmother    SOCIAL HISTORY: The patient lives in [place], [place] with her significant other Jamar and her child who is 29 years old. She works in [place] and also currently is occupied with paperwork re her mother who was recently admitted to an SNF. Denies history of alcohol and tobacco use.  ADVANCED DIRECTIVES: Not completed  HEALTH MAINTENANCE: Social History   Tobacco Use  . Smoking status: Former Smoker    Types: Cigarettes  .  Smokeless tobacco: Never Used  Vaping Use  . Vaping Use: Never used  Substance Use Topics  . Alcohol use: Not Currently  . Drug use: No   Colonoscopy: None  PAP: Reports that she completed a Pap smear in 2021  Bone density:  Lipid panel:  Allergies  Allergen Reactions  . Oxycodone-Acetaminophen Swelling and Other (See Comments)    Lips swell  . Prednisone Other (See Comments)    Elevates BGL   Current Facility-Administered Medications  Medication Dose Route Frequency Provider  Last Rate Last Admin  . 0.9 %  sodium chloride infusion  250 mL Intravenous PRN Bensimhon, Shaune Pascal, MD      . 0.9 %  sodium chloride infusion  250 mL Intravenous PRN Bensimhon, Shaune Pascal, MD      . acetaminophen (TYLENOL) tablet 650 mg  650 mg Oral Q4H PRN Bensimhon, Shaune Pascal, MD   650 mg at 05/30/20 0137  . ceFAZolin (ANCEF) IVPB 2g/100 mL premix  2 g Intravenous Q12H Nolberto Hanlon, MD      . Chlorhexidine Gluconate Cloth 2 % PADS 6 each  6 each Topical Daily Bensimhon, Shaune Pascal, MD   6 each at 05/29/20 0901  . enoxaparin (LOVENOX) injection 30 mg  30 mg Subcutaneous Q24H Bensimhon, Shaune Pascal, MD   30 mg at 05/30/20 1010  . feeding supplement (ENSURE ENLIVE / ENSURE PLUS) liquid 237 mL  237 mL Oral BID BM Bensimhon, Shaune Pascal, MD   237 mL at 05/30/20 1012  . guaiFENesin-dextromethorphan (ROBITUSSIN DM) 100-10 MG/5ML syrup 5 mL  5 mL Oral Q4H PRN Bensimhon, Shaune Pascal, MD   5 mL at 05/30/20 1012  . hydroxyurea (HYDREA) capsule 1,000 mg  1,000 mg Oral Daily Bensimhon, Shaune Pascal, MD   1,000 mg at 05/30/20 1008  . insulin aspart (novoLOG) injection 0-9 Units  0-9 Units Subcutaneous Q4H Bensimhon, Shaune Pascal, MD   2 Units at 05/30/20 1221  . insulin glargine (LANTUS) injection 25 Units  25 Units Subcutaneous QHS Bensimhon, Shaune Pascal, MD   25 Units at 05/28/20 2045  . milrinone (PRIMACOR) 20 MG/100 ML (0.2 mg/mL) infusion  0.125 mcg/kg/min Intravenous Continuous Chandrasekhar, Mahesh A, MD 2.39 mL/hr at 05/30/20 1226 0.125 mcg/kg/min at 05/30/20 1226  . multivitamin with minerals tablet 1 tablet  1 tablet Oral Daily Bensimhon, Shaune Pascal, MD   1 tablet at 05/30/20 1008  . ondansetron (ZOFRAN) injection 4 mg  4 mg Intravenous Q6H PRN Bensimhon, Shaune Pascal, MD   4 mg at 05/29/20 0005  . ondansetron (ZOFRAN) injection 4 mg  4 mg Intravenous Q6H PRN Bensimhon, Shaune Pascal, MD      . sodium chloride flush (NS) 0.9 % injection 10-40 mL  10-40 mL Intracatheter Q12H Bensimhon, Shaune Pascal, MD   10 mL at 05/29/20 0900  . sodium  chloride flush (NS) 0.9 % injection 10-40 mL  10-40 mL Intracatheter PRN Bensimhon, Shaune Pascal, MD      . sodium chloride flush (NS) 0.9 % injection 3 mL  3 mL Intravenous Q12H Bensimhon, Shaune Pascal, MD   3 mL at 05/29/20 0900  . sodium chloride flush (NS) 0.9 % injection 3 mL  3 mL Intravenous PRN Bensimhon, Shaune Pascal, MD      . sodium chloride flush (NS) 0.9 % injection 3 mL  3 mL Intravenous Q12H Bensimhon, Shaune Pascal, MD      . sodium chloride flush (NS) 0.9 % injection 3 mL  3 mL Intravenous PRN Bensimhon, Shaune Pascal,  MD      . sodium polystyrene (KAYEXALATE) 15 GM/60ML suspension 15 g  15 g Oral Once Nolberto Hanlon, MD      . torsemide [facility]) tablet 20 mg  20 mg Oral Daily Chandrasekhar, Mahesh A, MD       OBJECTIVE: Awake and alert, sitting on the side of the bed Vitals:   05/30/20 1135 05/30/20 1232  BP: (!) 94/56 (!) 94/54  Pulse: 100   Resp:    Temp:    SpO2: 90%    Body mass index is 28.41 kg/m. ECOG FS:1 - Symptomatic but completely ambulatory Ocular: Scleral icterus present Ear-nose-throat: Oropharynx clear, dentition fair Lymphatic: No cervical or supraclavicular adenopathy Lungs: Diminished breath sounds on the right, clear on the left Heart: Tachycardic, nonpitting edema in the bilateral lower extremities Abd soft, nontender, positive bowel sounds MSK no focal spinal tenderness Neuro: non-focal, well-oriented, appropriate affect  LAB RESULTS: CMP     Component Value Date/Time   NA 130 (L) 05/30/2020 0541   K 5.6 (H) 05/30/2020 0541   CL 104 05/30/2020 0541   CO2 18 (L) 05/30/2020 0541   GLUCOSE 140 (H) 05/30/2020 0541   BUN 69 (H) 05/30/2020 0541   CREATININE 2.53 (H) 05/30/2020 0541   CALCIUM 8.2 (L) 05/30/2020 0541   CALCIUM 8.4 (L) 03/25/2018 0457   PROT 8.8 (H) 05/29/2020 1129   ALBUMIN 2.7 (L) 05/29/2020 1129   AST 96 (H) 05/29/2020 1129   ALT 82 (H) 05/29/2020 1129   ALKPHOS 106 05/29/2020 1129   BILITOT 6.4 (H) 05/29/2020 1129   GFRNONAA 25 (L) 05/30/2020  0541   GFRAA 54 (L) 09/04/2019 0537   INo results found for: SPEP, UPEP Lab Results  Component Value Date   WBC 16.1 (H) 05/30/2020   NEUTROABS 7.5 05/28/2020   HGB 6.8 (LL) 05/30/2020   HCT 18.6 (L) 05/30/2020   MCV 121.6 (H) 05/30/2020   PLT 184 05/30/2020   _0 @ No results found for: LABCA2 No components found for: LABCA125 Recent Labs  Lab 05/28/20 0221  INR 1.4*   Urinalysis    Component Value Date/Time   COLORURINE YELLOW 05/27/2020 1610   APPEARANCEUR CLEAR 05/27/2020 1610   LABSPEC 1.010 05/27/2020 1610   PHURINE 5.5 05/27/2020 1610   GLUCOSEU >=500 (A) 05/27/2020 1610   HGBUR NEGATIVE 05/27/2020 1610   BILIRUBINUR NEGATIVE 05/27/2020 1610   KETONESUR NEGATIVE 05/27/2020 1610   PROTEINUR NEGATIVE 05/27/2020 1610   UROBILINOGEN 0.2 05/15/2013 2130   NITRITE NEGATIVE 05/27/2020 1610   LEUKOCYTESUR NEGATIVE 05/27/2020 1610   STUDIES: US Abdomen Complete  Result Date: 05/28/2020 CLINICAL DATA:  Elevated liver function tests, chronic renal insufficiency EXAM: ABDOMEN ULTRASOUND COMPLETE COMPARISON:  04/17/2020 FINDINGS: Gallbladder: Surgically absent Common bile duct: Diameter: 4 mm Liver: Stable heterogeneous increased liver echotexture most consistent with hepatic steatosis. No focal liver abnormality. No intrahepatic duct dilation. Portal vein is patent on color Doppler imaging with normal direction of blood flow towards the liver. IVC: No abnormality visualized. Pancreas: Visualized portion unremarkable. Spleen: Suboptimally visualized due to splenic atrophy noted on previous CT exams. Right Kidney: Length: 11.7 cm. Echogenicity within normal limits. No mass or hydronephrosis visualized. Left Kidney: Length: 10.3 cm. Echogenicity within normal limits. No mass or hydronephrosis visualized. Abdominal aorta: No aneurysm visualized. Other findings: None. IMPRESSION: 1. Increased liver echotexture compatible with hepatic steatosis. 2. Status post cholecystectomy.  3. 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 Electronically Signed   By: Legrand Como  Owens Shark M.D.   On: 05/28/2020 02:18   CARDIAC CATHETERIZATION  Result Date: 05/29/2020 Findings: RA = 23 RV = 34/20 PA = 35/14 (26) PCW = 16 Fick cardiac output/index = 9.1/5.8 Thermo CO/CI = 6.9/4.3 PVR = 1.1 (Fick) 1.5 (Thermo) Ao sat = 96% PA sat = 66%, 68% High SVC sat = 73% PAPi = 0.9 Assessment: 1. Very mild PAH 2. Severe RV failure however cardiac output preserved 3. No evidence of intracardiac shunt Plan/Discussion: Suspect she has near end-stage RV failure due to longstanding PAH. Continue medical therapy. Given normal cardiac output, not sure there is any role for inotropic support at this time. Glori Bickers, MD 7:49 PM   DG Chest Portable 1 View  Result Date: 05/27/2020 CLINICAL DATA:  Weakness and shortness of breath. EXAM: PORTABLE CHEST 1 VIEW COMPARISON:  08/30/2019 FINDINGS: Right chest wall port a catheter is noted with tip in the SVC. Cardiac enlargement, unchanged. New right pleural effusion with overlying subsegmental atelectasis. Pulmonary vascular congestion. The visualized osseous structures are unremarkable. IMPRESSION: 1. Cardiac enlargement, pulmonary vascular congestion, and new right pleural effusion with overlying subsegmental atelectasis. Findings concerning for CHF. Electronically Signed   By: Kerby Moors M.D.   On: 05/27/2020 17:04   ECHOCARDIOGRAM COMPLETE  Result Date: 05/28/2020    ECHOCARDIOGRAM REPORT   Patient Name:   Pamela Hudson Date of Exam: 05/28/2020 Medical Rec #:  [id number]     Height:       59.0 in Accession #:    [id number]    Weight:       138.7 lb Date of Birth:  1986/01/04     BSA:          1.579 m Patient Age:    34 years      BP:           101/64 mmHg Patient Gender: F             HR:           108 bpm. Exam Location:  Inpatient Procedure: 2D Echo, 3D Echo, Cardiac Doppler and Color Doppler Indications:     I51.7 Cardiomegaly  History:        Patient has prior history of Echocardiogram examinations, most                 recent 08/31/2018. Abnormal ECG, Stroke and Pulmonary HTN; Risk                 Factors:Hypertension and Diabetes. Sickle cell.  Sonographer:    Roseanna Rainbow RDCS Referring Phys: [place]  1. 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 The leaflets are fixed and appear immobile. Leaflet immobility is out of proportion to annular dilation. Would consider primary leaflet pathology such as carcinoid involvement in the work-up in addition to the patient's long history of severe pulmonary hypertension. The tricuspid valve is abnormal. Tricuspid valve regurgitation is severe.  2. RVSP is severely underestimated in the setting of severe tricuspid regurgitation. Right ventricular systolic function is severely reduced. The right ventricular size is severely enlarged. There is normal pulmonary artery systolic pressure. The estimated right ventricular systolic pressure is 23.5 mmHg.  3. Left ventricular ejection fraction, by estimation, is 45 to 50%. Left ventricular ejection fraction by 3D volume is 45 %. The left ventricle has mildly decreased function. The left ventricle demonstrates global hypokinesis. Indeterminate diastolic filling due to E-A fusion. There is the interventricular septum is flattened in systole and diastole, consistent with right  ventricular pressure and volume overload.  4. Right atrial size was severely dilated.  5. A small pericardial effusion is present. The pericardial effusion is circumferential.  6. The mitral valve is grossly normal. Mild to moderate mitral valve regurgitation. No evidence of mitral stenosis.  7. The aortic valve is tricuspid. Aortic valve regurgitation is not visualized. No aortic stenosis is present.  8. The inferior vena cava is dilated in size with <50% respiratory variability, suggesting right atrial pressure of 15 mmHg.  Comparison(s): Changes from prior study are noted. RV is severely dilated with severely reduced function. Severe torrential TR is present. LVEF is mildly reduced 45-50%. RVSP underestimated in setting of severe TR. FINDINGS  Left Ventricle: Left ventricular ejection fraction, by estimation, is 45 to 50%. Left ventricular ejection fraction by 3D volume is 45 %. The left ventricle has mildly decreased function. The left ventricle demonstrates global hypokinesis. The left ventricular internal cavity size was normal in size. There is no left ventricular hypertrophy. The interventricular septum is flattened in systole and diastole, consistent with right ventricular pressure and volume overload. Indeterminate diastolic filling due to E-A fusion. Right Ventricle: RVSP is severely underestimated in the setting of severe tricuspid regurgitation. The right ventricular size is severely enlarged. No increase in right ventricular wall thickness. Right ventricular systolic function is severely reduced. There is normal pulmonary artery systolic pressure. The tricuspid regurgitant velocity is 2.21 m/s, and with an assumed right atrial pressure of 15 mmHg, the estimated right ventricular systolic pressure is 18.2 mmHg. Left Atrium: Left atrial size was normal in size. Right Atrium: Right atrial size was severely dilated. Pericardium: A small pericardial effusion is present. The pericardial effusion is circumferential. Mitral Valve: The mitral valve is grossly normal. Mild to moderate mitral valve regurgitation. No evidence of mitral valve stenosis. MV peak gradient, 14.0 mmHg. The mean mitral valve gradient is 4.0 mmHg. Tricuspid Valve: There is severe, torrential tricuspid regurgitation. The leaflets are fixed and appear immobile. Leaflet immobility is out of proportion to annular dilation. Would consider primary leaflet pathology such as carcinoid involvement in the work-up in addition to the patient's long history of severe  pulmonary hypertension. The tricuspid valve is abnormal. Tricuspid valve regurgitation is severe. No evidence of tricuspid stenosis. The flow in the hepatic veins is reversed during ventricular systole. Aortic Valve: The aortic valve is tricuspid. Aortic valve regurgitation is not visualized. No aortic stenosis is present. Pulmonic Valve: The pulmonic valve was grossly normal. Pulmonic valve regurgitation is mild. No evidence of pulmonic stenosis. Aorta: The aortic root and ascending aorta are structurally normal, with no evidence of dilitation. Venous: The inferior vena cava is dilated in size with less than 50% respiratory variability, suggesting right atrial pressure of 15 mmHg. IAS/Shunts: There is left bowing of the interatrial septum, suggestive of elevated right atrial pressure. The atrial septum is grossly normal.  LEFT VENTRICLE PLAX 2D LVIDd:         4.80 cm         Diastology LVIDs:         3.60 cm         LV e' medial:    9.25 cm/s LV PW:         1.15 cm         LV E/e' medial:  14.9 LV IVS:        0.90 cm         LV e' lateral:   11.40 cm/s LVOT diam:  1.80 cm         LV E/e' lateral: 12.1 LV SV:         56 LV SV Index:   36 LVOT Area:     2.54 cm        3D Volume EF                                LV 3D EF:    Left                                             ventricular LV Volumes (MOD)                            ejection LV vol d, MOD    77.2 ml                    fraction by A2C:                                        3D volume LV vol d, MOD    98.4 ml                    is 45 %. A4C: LV vol s, MOD    36.1 ml A2C:                           3D Volume EF: LV vol s, MOD    59.1 ml       3D EF:        45 % A4C: LV SV MOD A2C:   41.1 ml LV SV MOD A4C:   98.4 ml LV SV MOD BP:    43.2 ml RIGHT VENTRICLE             IVC RV S prime:     10.90 cm/s  IVC diam: 3.00 cm TAPSE (M-mode): 1.9 cm LEFT ATRIUM           Index       RIGHT ATRIUM           Index LA diam:      4.00 cm 2.53 cm/m  RA Area:     25.80 cm  LA Vol (A2C): 56.3 ml 35.66 ml/m RA Volume:   102.00 ml 64.61 ml/m LA Vol (A4C): 39.5 ml 25.02 ml/m  AORTIC VALVE              PULMONIC VALVE LVOT Vmax:   134.00 cm/s  PR End Diast Vel: 2.37 msec LVOT Vmean:  104.000 cm/s LVOT VTI:    0.222 m  AORTA Ao Root diam: 2.70 cm Ao Asc diam:  3.30 cm MITRAL VALVE                 TRICUSPID VALVE MV Area (PHT): 5.84 cm      TR Peak grad:   19.5 mmHg MV Area VTI:   1.70 cm      TR Vmax:        221.00 cm/s MV Peak grad:  14.0 mmHg MV Mean grad:  4.0 mmHg      SHUNTS  MV Vmax:       1.87 m/s      Systemic VTI:  0.22 m MV Vmean:      81.7 cm/s     Systemic Diam: 1.80 cm MV Decel Time: 130 msec MR Peak grad:    83.7 mmHg MR Mean grad:    56.0 mmHg MR Vmax:         457.50 cm/s MR Vmean:        356.5 cm/s MR PISA:         1.01 cm MR PISA Eff ROA: 9 mm MR PISA Radius:  0.40 cm MV E velocity: 138.00 cm/s Eleonore Chiquito MD Electronically signed by Eleonore Chiquito MD Signature Date/Time: 05/28/2020/2:05:27 PM    Final    ASSESSMENT: 35 y.o. [place], [place] female with  1) sickle cell disease  2) anemia  3) pulmonary hypertension  4) CKD stage IIIb  5) diabetes mellitus type 2  PLAN: Pamela Hudson has sickle cell disease with anemia.  She is followed at [facility] for her sickle cell disease.  She is maintained on hydroxyurea 1000 mg daily, Oxbryta 1500 mg daily, and folic acid 1 mg daily.  She states that she rarely requires blood transfusions.  Review of her lab work from [facility] shows that her hemoglobin typically runs in the 6-7 range and that typically she only requires a blood transfusion if she drops into the 5 range or is feeling more symptomatic.  At this time, Pamela Hudson is not certain that she requires a blood transfusion.  She will be seen later today by Dr. Jana Hakim for final recommendations.  Mikey Bussing, NP 05/30/2020 2:05 PM    ADDENDUM: Met with Pamela Hudson and her SO Jamar and discussed her situation. She is aware of the negatives about transfusing  in the setting of SCD, specifically concerns regarding immunization against future transfusions, and also increasing viscosity. The latter is an issue here as she has been diuresed (we normally try to increase volume in SCD patients).   Looking at her HB from [facility] where she is usually followed, she had 6.8 on 03/13/2020 and 8.4 on 04/10/2020. The patient's values this admission have been Results for MLISSA, TAMAYO (MRN [id number]) as of 05/30/2020 17:02  Ref. Range 05/29/2020 19:25 05/29/2020 19:25 05/29/2020 19:35 05/29/2020 22:00 05/30/2020 05:41  Hemoglobin Latest Ref Range: 12.0 - 15.0 g/dL 7.8 (L) 7.8 (L) 7.1 (L) 6.8 (LL) 6.8 (LL)   Currently I don't find an indication to transfuse [acute drop in Hb usually requires 2 points and here it is only one (7.8 - 6.8) and the Hb has been stable over the past 24h]. Patients with chronic anemia develop compensatory changes (2,3DPG etc) so no one number mandates transfusion though Pamela Hudson tells me when her Hb drops below 6.0 she has been transfused in the past. I don't find a reticulocyte count, which I have ordered. If the retic appears inadequate (patient has stage III CKD) that would move [place] closer to transfusion.   I have requested a type and cross as these patients sometimes take days to crossmatch; however I have not ordered a transfusion. Instead will make that decision based on labs and the patient's situation tomorrow.  I personally saw this patient and performed a substantive portion of this encounter with the listed APP documented above.   Chauncey Cruel, MD Medical Oncology and Hematology [facility] [address] [place], [place] [address] Tel. [phone]    Fax. [phone]

## 2020-05-30 NOTE — Plan of Care (Signed)
  Problem: Clinical Measurements: Goal: Will remain free from infection Outcome: Progressing Goal: Respiratory complications will improve Outcome: Progressing   Problem: Coping: Goal: Level of anxiety will decrease Outcome: Progressing   

## 2020-05-31 DIAGNOSIS — R7881 Bacteremia: Secondary | ICD-10-CM

## 2020-05-31 DIAGNOSIS — N1832 Chronic kidney disease, stage 3b: Secondary | ICD-10-CM

## 2020-05-31 DIAGNOSIS — I50812 Chronic right heart failure: Secondary | ICD-10-CM | POA: Diagnosis not present

## 2020-05-31 DIAGNOSIS — N189 Chronic kidney disease, unspecified: Secondary | ICD-10-CM

## 2020-05-31 DIAGNOSIS — I50813 Acute on chronic right heart failure: Secondary | ICD-10-CM | POA: Diagnosis not present

## 2020-05-31 DIAGNOSIS — D5780 Other sickle-cell disorders without crisis: Secondary | ICD-10-CM

## 2020-05-31 DIAGNOSIS — I509 Heart failure, unspecified: Secondary | ICD-10-CM | POA: Diagnosis not present

## 2020-05-31 DIAGNOSIS — D649 Anemia, unspecified: Secondary | ICD-10-CM | POA: Diagnosis not present

## 2020-05-31 DIAGNOSIS — Z87891 Personal history of nicotine dependence: Secondary | ICD-10-CM

## 2020-05-31 DIAGNOSIS — E1122 Type 2 diabetes mellitus with diabetic chronic kidney disease: Secondary | ICD-10-CM

## 2020-05-31 DIAGNOSIS — I4891 Unspecified atrial fibrillation: Secondary | ICD-10-CM

## 2020-05-31 DIAGNOSIS — M109 Gout, unspecified: Secondary | ICD-10-CM

## 2020-05-31 DIAGNOSIS — F1021 Alcohol dependence, in remission: Secondary | ICD-10-CM

## 2020-05-31 DIAGNOSIS — Z7984 Long term (current) use of oral hypoglycemic drugs: Secondary | ICD-10-CM

## 2020-05-31 DIAGNOSIS — G8929 Other chronic pain: Secondary | ICD-10-CM

## 2020-05-31 DIAGNOSIS — I272 Pulmonary hypertension, unspecified: Secondary | ICD-10-CM | POA: Diagnosis not present

## 2020-05-31 LAB — CBC WITH DIFFERENTIAL/PLATELET
Abs Immature Granulocytes: 0.18 10*3/uL — ABNORMAL HIGH (ref 0.00–0.07)
Basophils Absolute: 0.1 10*3/uL (ref 0.0–0.1)
Basophils Relative: 0 %
Eosinophils Absolute: 0.1 10*3/uL (ref 0.0–0.5)
Eosinophils Relative: 1 %
HCT: 16.5 % — ABNORMAL LOW (ref 36.0–46.0)
Hemoglobin: 6.1 g/dL — CL (ref 12.0–15.0)
Immature Granulocytes: 1 %
Lymphocytes Relative: 18 %
Lymphs Abs: 2.8 10*3/uL (ref 0.7–4.0)
MCH: 44.5 pg — ABNORMAL HIGH (ref 26.0–34.0)
MCHC: 37 g/dL — ABNORMAL HIGH (ref 30.0–36.0)
MCV: 120.4 fL — ABNORMAL HIGH (ref 80.0–100.0)
Monocytes Absolute: 1.9 10*3/uL — ABNORMAL HIGH (ref 0.1–1.0)
Monocytes Relative: 12 %
Neutro Abs: 10.5 10*3/uL — ABNORMAL HIGH (ref 1.7–7.7)
Neutrophils Relative %: 68 %
Platelets: 179 10*3/uL (ref 150–400)
RBC: 1.37 MIL/uL — ABNORMAL LOW (ref 3.87–5.11)
RDW: 17.1 % — ABNORMAL HIGH (ref 11.5–15.5)
WBC: 15.6 10*3/uL — ABNORMAL HIGH (ref 4.0–10.5)
nRBC: 5.5 % — ABNORMAL HIGH (ref 0.0–0.2)

## 2020-05-31 LAB — BASIC METABOLIC PANEL
Anion gap: 8 (ref 5–15)
BUN: 67 mg/dL — ABNORMAL HIGH (ref 6–20)
CO2: 18 mmol/L — ABNORMAL LOW (ref 22–32)
Calcium: 8.2 mg/dL — ABNORMAL LOW (ref 8.9–10.3)
Chloride: 104 mmol/L (ref 98–111)
Creatinine, Ser: 2.14 mg/dL — ABNORMAL HIGH (ref 0.44–1.00)
GFR, Estimated: 31 mL/min — ABNORMAL LOW (ref >60)
Glucose, Bld: 224 mg/dL — ABNORMAL HIGH (ref 70–99)
Potassium: 4.7 mmol/L (ref 3.5–5.1)
Sodium: 130 mmol/L — ABNORMAL LOW (ref 135–145)

## 2020-05-31 LAB — GLUCOSE, CAPILLARY
Glucose-Capillary: 164 mg/dL — ABNORMAL HIGH (ref 70–99)
Glucose-Capillary: 190 mg/dL — ABNORMAL HIGH (ref 70–99)
Glucose-Capillary: 218 mg/dL — ABNORMAL HIGH (ref 70–99)
Glucose-Capillary: 227 mg/dL — ABNORMAL HIGH (ref 70–99)
Glucose-Capillary: 292 mg/dL — ABNORMAL HIGH (ref 70–99)
Glucose-Capillary: 307 mg/dL — ABNORMAL HIGH (ref 70–99)

## 2020-05-31 LAB — CBC
HCT: 20.6 % — ABNORMAL LOW (ref 36.0–46.0)
Hemoglobin: 7.7 g/dL — ABNORMAL LOW (ref 12.0–15.0)
MCH: 42.3 pg — ABNORMAL HIGH (ref 26.0–34.0)
MCHC: 37.4 g/dL — ABNORMAL HIGH (ref 30.0–36.0)
MCV: 113.2 fL — ABNORMAL HIGH (ref 80.0–100.0)
Platelets: 209 10*3/uL (ref 150–400)
RBC: 1.82 MIL/uL — ABNORMAL LOW (ref 3.87–5.11)
RDW: 23.2 % — ABNORMAL HIGH (ref 11.5–15.5)
WBC: 14.6 10*3/uL — ABNORMAL HIGH (ref 4.0–10.5)
nRBC: 7.3 % — ABNORMAL HIGH (ref 0.0–0.2)

## 2020-05-31 LAB — TECHNOLOGIST SMEAR REVIEW

## 2020-05-31 LAB — URINE CULTURE: Culture: <10000 — AB

## 2020-05-31 LAB — PREPARE RBC (CROSSMATCH)

## 2020-05-31 LAB — HEPATIC FUNCTION PANEL
ALT: 67 U/L — ABNORMAL HIGH (ref 0–44)
AST: 69 U/L — ABNORMAL HIGH (ref 15–41)
Albumin: 2.5 g/dL — ABNORMAL LOW (ref 3.5–5.0)
Alkaline Phosphatase: 102 U/L (ref 38–126)
Bilirubin, Direct: 2.5 mg/dL — ABNORMAL HIGH (ref 0.0–0.2)
Indirect Bilirubin: 3.4 mg/dL — ABNORMAL HIGH (ref 0.3–0.9)
Total Bilirubin: 5.9 mg/dL — ABNORMAL HIGH (ref 0.3–1.2)
Total Protein: 8.4 g/dL — ABNORMAL HIGH (ref 6.5–8.1)

## 2020-05-31 LAB — LACTATE DEHYDROGENASE: LDH: 247 U/L — ABNORMAL HIGH (ref 98–192)

## 2020-05-31 MED ORDER — PREDNISONE 20 MG PO TABS: 40.0000 mg | ORAL_TABLET | Freq: Every day | ORAL | Status: DC

## 2020-05-31 MED ORDER — VANCOMYCIN HCL 750 MG/150ML IV SOLN: 750.0000 mg | INTRAVENOUS | Status: DC

## 2020-05-31 MED ORDER — SODIUM CHLORIDE 0.9% FLUSH: 10.0000 mL | INTRAVENOUS | Status: DC | PRN

## 2020-05-31 MED ORDER — HEPARIN SOD (PORK) LOCK FLUSH 100 UNIT/ML IV SOLN
500.0000 [IU] | Freq: Every day | INTRAVENOUS | Status: AC | PRN
  Administered 2020-06-03: 500 [IU]
  Filled 2020-05-31: qty 5

## 2020-05-31 MED ORDER — ACETAMINOPHEN 325 MG PO TABS
325.0000 mg | ORAL_TABLET | Freq: Once | ORAL | Status: AC
  Administered 2020-05-31: 325 mg via ORAL
  Filled 2020-05-31: qty 1

## 2020-05-31 MED ORDER — VANCOMYCIN HCL 1000 MG/200ML IV SOLN: 1000.0000 mg | Freq: Two times a day (BID) | INTRAVENOUS | Status: DC

## 2020-05-31 MED ORDER — HEPARIN SOD (PORK) LOCK FLUSH 100 UNIT/ML IV SOLN
250.0000 [IU] | INTRAVENOUS | Status: DC | PRN
  Filled 2020-05-31: qty 2.5

## 2020-05-31 MED ORDER — VANCOMYCIN HCL 1250 MG/250ML IV SOLN
1250.0000 mg | Freq: Once | INTRAVENOUS | Status: AC
  Administered 2020-05-31: 1250 mg via INTRAVENOUS
  Filled 2020-05-31: qty 250

## 2020-05-31 MED ORDER — COLCHICINE 0.3 MG HALF TABLET
0.3000 mg | ORAL_TABLET | Freq: Once | ORAL | Status: AC
  Administered 2020-05-31: 0.3 mg via ORAL
  Filled 2020-05-31: qty 1

## 2020-05-31 MED ORDER — SODIUM CHLORIDE 0.9% FLUSH
3.0000 mL | INTRAVENOUS | Status: AC | PRN
  Administered 2020-06-04: 3 mL

## 2020-05-31 MED ORDER — DIPHENHYDRAMINE HCL 25 MG PO CAPS
25.0000 mg | ORAL_CAPSULE | Freq: Once | ORAL | Status: AC
  Administered 2020-05-31: 25 mg via ORAL
  Filled 2020-05-31: qty 1

## 2020-05-31 MED ORDER — SODIUM CHLORIDE 0.9% IV SOLUTION
250.0000 mL | Freq: Once | INTRAVENOUS | Status: AC
  Administered 2020-05-31: 250 mL via INTRAVENOUS

## 2020-05-31 MED ORDER — ALLOPURINOL 100 MG PO TABS: 50.0000 mg | ORAL_TABLET | ORAL | Status: DC

## 2020-05-31 MED ORDER — DARBEPOETIN ALFA 60 MCG/0.3ML IJ SOSY
60.0000 ug | PREFILLED_SYRINGE | Freq: Once | INTRAMUSCULAR | Status: AC
  Administered 2020-05-31: 60 ug via SUBCUTANEOUS
  Filled 2020-05-31: qty 0.3

## 2020-05-31 NOTE — Progress Notes (Addendum)
Per Dr Darnelle Catalan, it is ok to continue access to port for RN use and blood draw.

## 2020-05-31 NOTE — Progress Notes (Signed)
PROGRESS NOTE    Pamela Hudson  ZOX:[id number] DOB: Mar 12, 1986 DOA: 05/27/2020 PCP: Judd Lien, PA-C    Brief Narrative:  Pamela Hudson is a 35 y.o. female with medical history significant of cardiomegaly, CHF, chronic pain, CVA, HTN, PCOS, sickle cell anemia, DM2, chronic right heart strain, pulmonary Hypertension, CKD stage 3, HTN, transient A.fib COVID 2020    Presented with   Gradual leg swelling patient with CHF, chronic pain.  Patient was concerned that she was volume overloaded.  She has been having also worsening shortness of breath for the past few days to weeks.  States she has been taking her medication Reports up to 5 pound weight gain over the past 3 days  3/17- doe when walking the stairs. Feels close to baseline? Plan for [facility] cath today 3/18-s/p RHC on 3/17. Plan to start milrinone 3/19-Hg 6.1.  T-max 101.2.      Consultants:   Cardiology hematology  Procedures:   Antimicrobials:       Subjective: C/o LLE ankle pain similar to her gout   Objective: Vitals:   05/30/20 2357 05/31/20 0002 05/31/20 0319 05/31/20 0444  BP: 107/62  (!) 100/57   Pulse:   (!) 110   Resp:   18   Temp: (!) 101.2 F (38.4 C)  98.4 F (36.9 C)   TempSrc: Oral  Oral   SpO2:   97%   Weight:  64.2 kg  64.2 kg  Height:        Intake/Output Summary (Last 24 hours) at 05/31/2020 0826 Last data filed at 05/31/2020 0800 Gross per 24 hour  Intake 734.6 ml  Output 800 ml  Net -65.4 ml   Filed Weights   05/30/20 0511 05/31/20 0002 05/31/20 0444  Weight: 63.8 kg 64.2 kg 64.2 kg    Examination: Lying in bed, NAD Decreased breath sounds at bases no wheezing Tachycardic S1-S2 regular no gallops Mildly distended abdomen, positive bowel sounds nontender Trace pedal edema bilaterally . No erythema alert oriented x3 grossly intact     Data Reviewed: I have personally reviewed following labs and imaging studies  CBC: Recent Labs  Lab 05/27/20 1604 05/28/20 0221  05/29/20 1925 05/29/20 1935 05/29/20 2200 05/30/20 0541 05/31/20 0351  WBC 12.8* 12.5*  --   --  13.8* 16.1* 15.6*  NEUTROABS 7.9* 7.5  --   --   --   --  10.5*  HGB 7.8* 7.4* 7.8*  7.8* 7.1* 6.8* 6.8* 6.1*  HCT 20.9* 19.9* 23.0*  23.0* 21.0* 18.6* 18.6* 16.5*  MCV 122.2* 122.8*  --   --  122.4* 121.6* 120.4*  PLT 222 194  --   --  183 184 179   Basic Metabolic Panel: Recent Labs  Lab 05/28/20 0221 05/29/20 0615 05/29/20 1129 05/29/20 1925 05/29/20 1935 05/29/20 2200 05/30/20 0541 05/30/20 1717  NA 132* 130* 130* 137  138 141  --  130* 130*  K 4.9 5.4* 5.2* 5.3*  5.3* 4.8  --  5.6* 5.5*  CL 107 104 105  --   --   --  104 104  CO2 17* 18* 18*  --   --   --  18* 17*  GLUCOSE 170* 139* 168*  --   --   --  140* 275*  BUN 50* 59* 60*  --   --   --  69* 71*  CREATININE 1.63* 2.41* 2.44*  --   --  2.49* 2.53* 2.41*  CALCIUM 8.5* 8.0* 8.0*  --   --   --  8.2* 8.1*  MG 2.4  --   --   --   --   --   --   --   PHOS 4.5  --   --   --   --   --   --   --    GFR: Estimated Creatinine Clearance: 27.3 mL/min (A) (by C-G formula based on SCr of 2.41 mg/dL (H)). Liver Function Tests: Recent Labs  Lab 05/27/20 1700 05/28/20 0221 05/29/20 1129 05/31/20 0351  AST 98* 89* 96* 69*  ALT 88* 85* 82* 67*  ALKPHOS 118 106 106 102  BILITOT 5.0* 5.6* 6.4* 5.9*  PROT 9.4* 9.5* 8.8* 8.4*  ALBUMIN 3.2* 2.8* 2.7* 2.5*   Recent Labs  Lab 05/27/20 1700  LIPASE 30   No results for input(s): AMMONIA in the last 168 hours. Coagulation Profile: Recent Labs  Lab 05/28/20 0221  INR 1.4*   Cardiac Enzymes: No results for input(s): CKTOTAL, CKMB, CKMBINDEX, TROPONINI in the last 168 hours. BNP (last 3 results) No results for input(s): PROBNP in the last 8760 hours. HbA1C: No results for input(s): HGBA1C in the last 72 hours. CBG: Recent Labs  Lab 05/30/20 1620 05/30/20 1955 05/30/20 2351 05/31/20 0421 05/31/20 0736  GLUCAP 247* 309* 200* 218* 227*   Lipid Profile: No results  for input(s): CHOL, HDL, LDLCALC, TRIG, CHOLHDL, LDLDIRECT in the last 72 hours. Thyroid Function Tests: No results for input(s): TSH, T4TOTAL, FREET4, T3FREE, THYROIDAB in the last 72 hours. Anemia Panel: Recent Labs    05/30/20 1615  RETICCTPCT 7.6*   Sepsis Labs: No results for input(s): PROCALCITON, LATICACIDVEN in the last 168 hours.  Recent Results (from the past 240 hour(s))  Resp Panel by RT-PCR (Flu A&B, Covid) Nasopharyngeal Swab     Status: None   Collection Time: 05/27/20  4:26 PM   Specimen: Nasopharyngeal Swab; Nasopharyngeal(NP) swabs in vial transport medium  Result Value Ref Range Status   SARS Coronavirus 2 by RT PCR NEGATIVE NEGATIVE Final    Comment: (NOTE) SARS-CoV-2 target nucleic acids are NOT DETECTED.  The SARS-CoV-2 RNA is generally detectable in upper respiratory specimens during the acute phase of infection. The lowest concentration of SARS-CoV-2 viral copies this assay can detect is 138 copies/mL. A negative result does not preclude SARS-Cov-2 infection and should not be used as the sole basis for treatment or other patient management decisions. A negative result may occur with  improper specimen collection/handling, submission of specimen other than nasopharyngeal swab, presence of viral mutation(s) within the areas targeted by this assay, and inadequate number of viral copies(<138 copies/mL). A negative result must be combined with clinical observations, patient history, and epidemiological information. The expected result is Negative.  Fact Sheet for Patients:  BloggerCourse.comhttps://www.fda.gov/media/152166/download  Fact Sheet for Healthcare Providers:  SeriousBroker.ithttps://www.fda.gov/media/152162/download  This test is no t yet approved or cleared by the [place] FDA and  has been authorized for detection and/or diagnosis of SARS-CoV-2 by FDA under an Emergency Use Authorization (EUA). This EUA will remain  in effect (meaning this test can be used) for the  duration of the COVID-19 declaration under Section 564(b)(1) of the Act, 21 U.S.C.section 360bbb-3(b)(1), unless the authorization is terminated  or revoked sooner.       Influenza A by PCR NEGATIVE NEGATIVE Final   Influenza B by PCR NEGATIVE NEGATIVE Final    Comment: (NOTE) The Xpert Xpress SARS-CoV-2/FLU/RSV plus assay is intended as an aid in the diagnosis of influenza from Nasopharyngeal swab specimens and should  not be used as a sole basis for treatment. Nasal washings and aspirates are unacceptable for Xpert Xpress SARS-CoV-2/FLU/RSV testing.  Fact Sheet for Patients: BloggerCourse.com  Fact Sheet for Healthcare Providers: SeriousBroker.it  This test is not yet approved or cleared by the [place] FDA and has been authorized for detection and/or diagnosis of SARS-CoV-2 by FDA under an Emergency Use Authorization (EUA). This EUA will remain in effect (meaning this test can be used) for the duration of the COVID-19 declaration under Section 564(b)(1) of the Act, 21 U.S.C. section 360bbb-3(b)(1), unless the authorization is terminated or revoked.  Performed at [facility], [address] [place], [place] [address]   Culture, blood (routine x 2)     Status: None (Preliminary result)   Collection Time: 05/28/20 10:45 PM   Specimen: BLOOD RIGHT HAND  Result Value Ref Range Status   Specimen Description BLOOD RIGHT HAND  Final   Special Requests   Final    BOTTLES DRAWN AEROBIC ONLY Blood Culture adequate volume   Culture  Setup Time   Final    GRAM POSITIVE COCCI IN CLUSTERS AEROBIC BOTTLE ONLY CRITICAL VALUE NOTED.  VALUE IS CONSISTENT WITH PREVIOUSLY REPORTED AND CALLED VALUE. Performed at [facility], [address] [place], [place] [address]    Culture GRAM POSITIVE COCCI  Final   Report Status PENDING  Incomplete  Culture, blood (routine x 2)     Status: None (Preliminary result)    Collection Time: 05/28/20 10:45 PM   Specimen: BLOOD LEFT HAND  Result Value Ref Range Status   Specimen Description BLOOD LEFT HAND  Final   Special Requests   Final    BOTTLES DRAWN AEROBIC ONLY Blood Culture results may not be optimal due to an inadequate volume of blood received in culture bottles   Culture  Setup Time   Final    GRAM POSITIVE COCCI IN CLUSTERS AEROBIC BOTTLE ONLY Organism ID to follow CRITICAL RESULT CALLED TO, READ BACK BY AND VERIFIED WITH: C. AMEND PHARMD, AT 0550 05/30/20 BY D VANHOOK Performed at [facility], [address], [place], [place] [address]    Culture GRAM POSITIVE COCCI  Final   Report Status PENDING  Incomplete  Blood Culture ID Panel (Reflexed)     Status: Abnormal   Collection Time: 05/28/20 10:45 PM  Result Value Ref Range Status   Enterococcus faecalis NOT DETECTED NOT DETECTED Final   Enterococcus Faecium NOT DETECTED NOT DETECTED Final   Listeria monocytogenes NOT DETECTED NOT DETECTED Final   Staphylococcus species DETECTED (A) NOT DETECTED Final    Comment: CRITICAL RESULT CALLED TO, READ BACK BY AND VERIFIED WITH: C. AMEND PHARMD, AT 0550 05/30/20 BY D VANHOOK    Staphylococcus aureus (BCID) NOT DETECTED NOT DETECTED Final   Staphylococcus epidermidis NOT DETECTED NOT DETECTED Final   Staphylococcus lugdunensis NOT DETECTED NOT DETECTED Final   Streptococcus species NOT DETECTED NOT DETECTED Final   Streptococcus agalactiae NOT DETECTED NOT DETECTED Final   Streptococcus pneumoniae NOT DETECTED NOT DETECTED Final   Streptococcus pyogenes NOT DETECTED NOT DETECTED Final   A.calcoaceticus-baumannii NOT DETECTED NOT DETECTED Final   Bacteroides fragilis NOT DETECTED NOT DETECTED Final   Enterobacterales NOT DETECTED NOT DETECTED Final   Enterobacter cloacae complex NOT DETECTED NOT DETECTED Final   Escherichia coli NOT DETECTED NOT DETECTED Final   Klebsiella aerogenes NOT DETECTED NOT DETECTED Final   Klebsiella oxytoca NOT  DETECTED NOT DETECTED Final  Klebsiella pneumoniae NOT DETECTED NOT DETECTED Final   Proteus species NOT DETECTED NOT DETECTED Final   Salmonella species NOT DETECTED NOT DETECTED Final   Serratia marcescens NOT DETECTED NOT DETECTED Final   Haemophilus influenzae NOT DETECTED NOT DETECTED Final   Neisseria meningitidis NOT DETECTED NOT DETECTED Final   Pseudomonas aeruginosa NOT DETECTED NOT DETECTED Final   Stenotrophomonas maltophilia NOT DETECTED NOT DETECTED Final   Candida albicans NOT DETECTED NOT DETECTED Final   Candida auris NOT DETECTED NOT DETECTED Final   Candida glabrata NOT DETECTED NOT DETECTED Final   Candida krusei NOT DETECTED NOT DETECTED Final   Candida parapsilosis NOT DETECTED NOT DETECTED Final   Candida tropicalis NOT DETECTED NOT DETECTED Final   Cryptococcus neoformans/gattii NOT DETECTED NOT DETECTED Final    Comment: Performed at [facility], [address] [place], [place] [address]  Culture, blood (routine x 2)     Status: None (Preliminary result)   Collection Time: 05/29/20  2:38 PM   Specimen: BLOOD LEFT HAND  Result Value Ref Range Status   Specimen Description BLOOD LEFT HAND  Final   Special Requests   Final    BOTTLES DRAWN AEROBIC AND ANAEROBIC Blood Culture results may not be optimal due to an inadequate volume of blood received in culture bottles   Culture   Final    NO GROWTH < 12 HOURS Performed at [facility], [address], [place], [place] [address]    Report Status PENDING  Incomplete  Culture, blood (routine x 2)     Status: None (Preliminary result)   Collection Time: 05/29/20  2:46 PM   Specimen: BLOOD RIGHT HAND  Result Value Ref Range Status   Specimen Description BLOOD RIGHT HAND  Final   Special Requests   Final    BOTTLES DRAWN AEROBIC ONLY Blood Culture results may not be optimal due to an inadequate volume of blood received in culture bottles   Culture   Final    NO GROWTH < 12 HOURS Performed at [facility], [address] [place], [place] [address]    Report Status PENDING  Incomplete         Radiology Studies: CARDIAC CATHETERIZATION  Result Date: 05/29/2020 Findings: RA = 23 RV = 34/20 PA = 35/14 (26) PCW = 16 Fick cardiac output/index = 9.1/5.8 Thermo CO/CI = 6.9/4.3 PVR = 1.1 (Fick) 1.5 (Thermo) Ao sat = 96% PA sat = 66%, 68% High SVC sat = 73% PAPi = 0.9 Assessment: 1. Very mild PAH 2. Severe RV failure however cardiac output preserved 3. No evidence of intracardiac shunt Plan/Discussion: Suspect she has near end-stage RV failure due to longstanding PAH. Continue medical therapy. Given normal cardiac output, not sure there is any role for inotropic support at this time. Arvilla Meres, MD 7:49 PM        Scheduled Meds: . Chlorhexidine Gluconate Cloth  6 each Topical Daily  . enoxaparin (LOVENOX) injection  30 mg Subcutaneous Q24H  . feeding supplement  237 mL Oral BID BM  . hydroxyurea  1,000 mg Oral Daily  . insulin aspart  0-9 Units Subcutaneous Q4H  . insulin glargine  25 Units Subcutaneous QHS  . multivitamin with minerals  1 tablet Oral Daily  . sodium chloride flush  10-40 mL Intracatheter Q12H  . sodium chloride flush  3 mL Intravenous Q12H  . sodium chloride flush  3 mL Intravenous Q12H  . torsemide  40 mg Oral  Daily   Continuous Infusions: . sodium chloride    . sodium chloride    .  ceFAZolin (ANCEF) IV 2 g (05/31/20 0432)  . milrinone 0.125 mcg/kg/min (05/30/20 1226)    Assessment & Plan:   Active Problems:   Sickle cell anemia (HCC)   Elevated bilirubin   Leukocytosis   Dyspnea   Acute on chronic right-sided heart failure (HCC)   Chronic right-sided heart failure (HCC)   Pulmonary hypertension (HCC)   CKD (chronic kidney disease) stage 3, GFR 30-59 ml/min (HCC)   DM2 (diabetes mellitus, type 2) (HCC)   35 y.o. female with medical history significant of cardiomegaly, CHF, chronic pain, CVA, HTN, PCOS, sickle cell anemia, DM2, chronic  right heart strain, pulmonary Hypertension, CKD stage 3, HTN, transient A.fib Admitted for acute on chronic Right heart failure.  Present on Admission: Acute biventricular HF Echo 45 to 50%.  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 Very mild PAH, 2. Severe RV failure however cardiac output preserved, 3. No evidence of intracardiac shunt  3/19-continue on low dose milrinone and Demadex, increased to 40mg  qd I/o ?TEE for evaluation of TR.      . Sickle cell anemia (HCC)  Hemoglobin 6.1 Hematology was consulted.  Plan to transfuse patient today. Continue hydroxyurea     Fever- Febrile again Leukocytosis Blood cultures with staph capitis from 3/16 We will start patient on vancomycin IV Will consult ID consultation.  Patient having report  . Pulmonary hypertension (HCC) - Cardiology following WHO functional class III stage C 3/18-s/p RHC with results above. on right heart cath revealed only mild pulmonary hypertension at this time, she does have severe RV failure and management as above  No history of DVT or PE to suggest chronic thromboembolic disease: Patient improves will need V/Q scan as inpatient  Fluid restriction less than 2 L    . Leukocytosis - Chronic, however wbc going up Febrile again +bcx as above Start iv vanco.   Hyperkalemia-K is 5.6 we will give Kayexalate Stable now    . CKD (chronic kidney disease) stage 3b Mildly up, cr 2.53>>>2.14 improving Possibly due to hypoperfusion from CHF 3/19-continue milrinone and diuretics  Avoid nephrotoxic meds Monitor renal function     GOUT- LLE ankle Will place her on meds that is appropriate with her renal function.  . Elevated bilirubin  - chronic abnormal LFTs history of febrile hepatomegaly in the past Abdominal ultrasound with increased liver echotexture compatible with hepatic steatosis 3/19-may also be affected from her BIV failure. Continue to monitor  Dm2-  -  BG  mildly elevated at times  Sensitive  SSI  A1c 5.2 TSH 5.9129 and FT4 1.27, will need reck once acute medical issues stablized and reck levels in 4 weeks    DVT prophylaxis: Lovenox Code Status: Full Family Communication: None at bedside  Status is: Inpatient  Remains inpatient appropriate because:IV treatments appropriate due to intensity of illness or inability to take PO   Dispo: The patient is from: Home              Anticipated d/c is to: Home              Patient currently is not medically stable to d/c.   Difficult to place patient No  Patient still receiving IV Lasix due to volume overload, starting IV milrinone          LOS: 4 days   Time spent: 45 minutes with more than 50% on  COC    Lynn Ito, MD [facility] Pager [phone]  If 7PM-7AM, please contact night-coverage 05/31/2020, 8:26 AM

## 2020-05-31 NOTE — Progress Notes (Signed)
[facility]   DOB:16-Sep-1985   TI#:[id number]   ASN#:[id number]  Subjective:  Pamela Hudson spiked a temperature last night; she is now starting antibiotics. Tells me her breathing is better. Denies chest ain or pressure, pleurisy or hemoptysis. Tells me her gout is acting up and she has trouble getting to the BR. Nurses in room   Objective: African American woman examined in bed Vitals:   05/31/20 0319 05/31/20 1126  BP: (!) 100/57 100/67  Pulse: (!) 110 (!) 110  Resp: 18   Temp: 98.4 F (36.9 C) 98.5 F (36.9 C)  SpO2: 97%     Body mass index is 28.6 kg/m.  Intake/Output Summary (Last 24 hours) at 05/31/2020 1138 Last data filed at 05/31/2020 0930 Gross per 24 hour  Intake 614.6 ml  Output 1200 ml  Net -585.4 ml   .  CBG (last 3)  Recent Labs    05/30/20 2351 05/31/20 0421 05/31/20 0736  GLUCAP 200* 218* 227*     Labs:  Lab Results  Component Value Date   WBC 15.6 (H) 05/31/2020   HGB 6.1 (LL) 05/31/2020   HCT 16.5 (L) 05/31/2020   MCV 120.4 (H) 05/31/2020   PLT 179 05/31/2020   NEUTROABS 10.5 (H) 05/31/2020    @LASTCHEMISTRY @  Urine Studies No results for input(s): UHGB, CRYS in the last 72 hours.  Invalid input(s): UACOL, UAPR, USPG, UPH, UTP, UGL, UKET, UBIL, UNIT, UROB, ULEU, UEPI, UWBC, URBC, UBAC, CAST, [place], [place]  Basic Metabolic Panel: Recent Labs  Lab 05/28/20 0221 05/29/20 0615 05/29/20 1129 05/29/20 1925 05/29/20 1935 05/29/20 2200 05/30/20 0541 05/30/20 1717 05/31/20 0835  NA 132* 130* 130* 137  138 141  --  130* 130* 130*  K 4.9 5.4* 5.2* 5.3*  5.3* 4.8  --  5.6* 5.5* 4.7  CL 107 104 105  --   --   --  104 104 104  CO2 17* 18* 18*  --   --   --  18* 17* 18*  GLUCOSE 170* 139* 168*  --   --   --  140* 275* 224*  BUN 50* 59* 60*  --   --   --  69* 71* 67*  CREATININE 1.63* 2.41* 2.44*  --   --  2.49* 2.53* 2.41* 2.14*  CALCIUM 8.5* 8.0* 8.0*  --   --   --  8.2* 8.1* 8.2*  MG 2.4  --   --   --   --   --   --   --   --   PHOS 4.5  --    --   --   --   --   --   --   --    GFR Estimated Creatinine Clearance: 30.7 mL/min (A) (by C-G formula based on SCr of 2.14 mg/dL (H)). Liver Function Tests: Recent Labs  Lab 05/27/20 1700 05/28/20 0221 05/29/20 1129 05/31/20 0351  AST 98* 89* 96* 69*  ALT 88* 85* 82* 67*  ALKPHOS 118 106 106 102  BILITOT 5.0* 5.6* 6.4* 5.9*  PROT 9.4* 9.5* 8.8* 8.4*  ALBUMIN 3.2* 2.8* 2.7* 2.5*   Recent Labs  Lab 05/27/20 1700  LIPASE 30   No results for input(s): AMMONIA in the last 168 hours. Coagulation profile Recent Labs  Lab 05/28/20 0221  INR 1.4*    CBC: Recent Labs  Lab 05/27/20 1604 05/28/20 0221 05/29/20 1925 05/29/20 1935 05/29/20 2200 05/30/20 0541 05/31/20 0351  WBC 12.8* 12.5*  --   --  13.8*  16.1* 15.6*  NEUTROABS 7.9* 7.5  --   --   --   --  10.5*  HGB 7.8* 7.4* 7.8*  7.8* 7.1* 6.8* 6.8* 6.1*  HCT 20.9* 19.9* 23.0*  23.0* 21.0* 18.6* 18.6* 16.5*  MCV 122.2* 122.8*  --   --  122.4* 121.6* 120.4*  PLT 222 194  --   --  183 184 179   Cardiac Enzymes: No results for input(s): CKTOTAL, CKMB, CKMBINDEX, TROPONINI in the last 168 hours. BNP: Invalid input(s): POCBNP CBG: Recent Labs  Lab 05/30/20 1620 05/30/20 1955 05/30/20 2351 05/31/20 0421 05/31/20 0736  GLUCAP 247* 309* 200* 218* 227*   D-Dimer No results for input(s): DDIMER in the last 72 hours. Hgb A1c No results for input(s): HGBA1C in the last 72 hours. Lipid Profile No results for input(s): CHOL, HDL, LDLCALC, TRIG, CHOLHDL, LDLDIRECT in the last 72 hours. Thyroid function studies No results for input(s): TSH, T4TOTAL, T3FREE, THYROIDAB in the last 72 hours.  Invalid input(s): FREET3 Anemia work up Recent Labs    05/30/20 1615  RETICCTPCT 7.6*   Microbiology Recent Results (from the past 240 hour(s))  Resp Panel by RT-PCR (Flu A&B, Covid) Nasopharyngeal Swab     Status: None   Collection Time: 05/27/20  4:26 PM   Specimen: Nasopharyngeal Swab; Nasopharyngeal(NP) swabs in vial  transport medium  Result Value Ref Range Status   SARS Coronavirus 2 by RT PCR NEGATIVE NEGATIVE Final    Comment: (NOTE) SARS-CoV-2 target nucleic acids are NOT DETECTED.  The SARS-CoV-2 RNA is generally detectable in upper respiratory specimens during the acute phase of infection. The lowest concentration of SARS-CoV-2 viral copies this assay can detect is 138 copies/mL. A negative result does not preclude SARS-Cov-2 infection and should not be used as the sole basis for treatment or other patient management decisions. A negative result may occur with  improper specimen collection/handling, submission of specimen other than nasopharyngeal swab, presence of viral mutation(s) within the areas targeted by this assay, and inadequate number of viral copies(<138 copies/mL). A negative result must be combined with clinical observations, patient history, and epidemiological information. The expected result is Negative.  Fact Sheet for Patients:  BloggerCourse.comhttps://www.fda.gov/media/152166/download  Fact Sheet for Healthcare Providers:  SeriousBroker.ithttps://www.fda.gov/media/152162/download  This test is no t yet approved or cleared by the [place] FDA and  has been authorized for detection and/or diagnosis of SARS-CoV-2 by FDA under an Emergency Use Authorization (EUA). This EUA will remain  in effect (meaning this test can be used) for the duration of the COVID-19 declaration under Section 564(b)(1) of the Act, 21 U.S.C.section 360bbb-3(b)(1), unless the authorization is terminated  or revoked sooner.       Influenza A by PCR NEGATIVE NEGATIVE Final   Influenza B by PCR NEGATIVE NEGATIVE Final    Comment: (NOTE) The Xpert Xpress SARS-CoV-2/FLU/RSV plus assay is intended as an aid in the diagnosis of influenza from Nasopharyngeal swab specimens and should not be used as a sole basis for treatment. Nasal washings and aspirates are unacceptable for Xpert Xpress SARS-CoV-2/FLU/RSV testing.  Fact  Sheet for Patients: BloggerCourse.comhttps://www.fda.gov/media/152166/download  Fact Sheet for Healthcare Providers: SeriousBroker.ithttps://www.fda.gov/media/152162/download  This test is not yet approved or cleared by the [place] FDA and has been authorized for detection and/or diagnosis of SARS-CoV-2 by FDA under an Emergency Use Authorization (EUA). This EUA will remain in effect (meaning this test can be used) for the duration of the COVID-19 declaration under Section 564(b)(1) of the Act, 21 U.S.C. section 360bbb-3(b)(1),  unless the authorization is terminated or revoked.  Performed at [facility], [address] [place], [place] [address]   Culture, blood (routine x 2)     Status: Abnormal (Preliminary result)   Collection Time: 05/28/20 10:45 PM   Specimen: BLOOD RIGHT HAND  Result Value Ref Range Status   Specimen Description BLOOD RIGHT HAND  Final   Special Requests   Final    BOTTLES DRAWN AEROBIC ONLY Blood Culture adequate volume   Culture  Setup Time   Final    GRAM POSITIVE COCCI IN CLUSTERS AEROBIC BOTTLE ONLY CRITICAL VALUE NOTED.  VALUE IS CONSISTENT WITH PREVIOUSLY REPORTED AND CALLED VALUE. Performed at [facility], [address], [place], [place] [address]    Culture STAPHYLOCOCCUS CAPITIS (A)  Final   Report Status PENDING  Incomplete  Culture, blood (routine x 2)     Status: Abnormal (Preliminary result)   Collection Time: 05/28/20 10:45 PM   Specimen: BLOOD LEFT HAND  Result Value Ref Range Status   Specimen Description BLOOD LEFT HAND  Final   Special Requests   Final    BOTTLES DRAWN AEROBIC ONLY Blood Culture results may not be optimal due to an inadequate volume of blood received in culture bottles   Culture  Setup Time   Final    GRAM POSITIVE COCCI IN CLUSTERS AEROBIC BOTTLE ONLY Organism ID to follow CRITICAL RESULT CALLED TO, READ BACK BY AND VERIFIED WITH: C. AMEND PHARMD, AT 0550 05/30/20 BY D VANHOOK    Culture (A)  Final    STAPHYLOCOCCUS  CAPITIS SUSCEPTIBILITIES TO FOLLOW Performed at [facility], [address], [place], [place] [address]    Report Status PENDING  Incomplete  Blood Culture ID Panel (Reflexed)     Status: Abnormal   Collection Time: 05/28/20 10:45 PM  Result Value Ref Range Status   Enterococcus faecalis NOT DETECTED NOT DETECTED Final   Enterococcus Faecium NOT DETECTED NOT DETECTED Final   Listeria monocytogenes NOT DETECTED NOT DETECTED Final   Staphylococcus species DETECTED (A) NOT DETECTED Final    Comment: CRITICAL RESULT CALLED TO, READ BACK BY AND VERIFIED WITH: C. AMEND PHARMD, AT 0550 05/30/20 BY D VANHOOK    Staphylococcus aureus (BCID) NOT DETECTED NOT DETECTED Final   Staphylococcus epidermidis NOT DETECTED NOT DETECTED Final   Staphylococcus lugdunensis NOT DETECTED NOT DETECTED Final   Streptococcus species NOT DETECTED NOT DETECTED Final   Streptococcus agalactiae NOT DETECTED NOT DETECTED Final   Streptococcus pneumoniae NOT DETECTED NOT DETECTED Final   Streptococcus pyogenes NOT DETECTED NOT DETECTED Final   A.calcoaceticus-baumannii NOT DETECTED NOT DETECTED Final   Bacteroides fragilis NOT DETECTED NOT DETECTED Final   Enterobacterales NOT DETECTED NOT DETECTED Final   Enterobacter cloacae complex NOT DETECTED NOT DETECTED Final   Escherichia coli NOT DETECTED NOT DETECTED Final   Klebsiella aerogenes NOT DETECTED NOT DETECTED Final   Klebsiella oxytoca NOT DETECTED NOT DETECTED Final   Klebsiella pneumoniae NOT DETECTED NOT DETECTED Final   Proteus species NOT DETECTED NOT DETECTED Final   Salmonella species NOT DETECTED NOT DETECTED Final   Serratia marcescens NOT DETECTED NOT DETECTED Final   Haemophilus influenzae NOT DETECTED NOT DETECTED Final   Neisseria meningitidis NOT DETECTED NOT DETECTED Final   Pseudomonas aeruginosa NOT DETECTED NOT DETECTED Final   Stenotrophomonas maltophilia NOT DETECTED NOT DETECTED Final   Candida albicans NOT DETECTED NOT DETECTED  Final   Candida auris NOT DETECTED NOT DETECTED  Final   Candida glabrata NOT DETECTED NOT DETECTED Final   Candida krusei NOT DETECTED NOT DETECTED Final   Candida parapsilosis NOT DETECTED NOT DETECTED Final   Candida tropicalis NOT DETECTED NOT DETECTED Final   Cryptococcus neoformans/gattii NOT DETECTED NOT DETECTED Final    Comment: Performed at [facility], [address], [place], [place] [address]  Culture, blood (routine x 2)     Status: None (Preliminary result)   Collection Time: 05/29/20  2:38 PM   Specimen: BLOOD LEFT HAND  Result Value Ref Range Status   Specimen Description BLOOD LEFT HAND  Final   Special Requests   Final    BOTTLES DRAWN AEROBIC AND ANAEROBIC Blood Culture results may not be optimal due to an inadequate volume of blood received in culture bottles   Culture   Final    NO GROWTH < 12 HOURS Performed at [facility], [address] [place], [place] [address]    Report Status PENDING  Incomplete  Culture, blood (routine x 2)     Status: None (Preliminary result)   Collection Time: 05/29/20  2:46 PM   Specimen: BLOOD RIGHT HAND  Result Value Ref Range Status   Specimen Description BLOOD RIGHT HAND  Final   Special Requests   Final    BOTTLES DRAWN AEROBIC ONLY Blood Culture results may not be optimal due to an inadequate volume of blood received in culture bottles   Culture   Final    NO GROWTH < 12 HOURS Performed at [facility], [address], [place], [place] [address]    Report Status PENDING  Incomplete  Culture, Urine     Status: Abnormal   Collection Time: 05/30/20  5:41 AM   Specimen: Urine, Catheterized  Result Value Ref Range Status   Specimen Description URINE, CATHETERIZED  Final   Special Requests NONE  Final   Culture (A)  Final    <10,000 COLONIES/mL INSIGNIFICANT GROWTH Performed at [facility], [address], [place], [place] [address]    Report Status 05/31/2020 FINAL  Final      Studies:   CARDIAC CATHETERIZATION  Result Date: 05/29/2020 Findings: RA = 23 RV = 34/20 PA = 35/14 (26) PCW = 16 Fick cardiac output/index = 9.1/5.8 Thermo CO/CI = 6.9/4.3 PVR = 1.1 (Fick) 1.5 (Thermo) Ao sat = 96% PA sat = 66%, 68% High SVC sat = 73% PAPi = 0.9 Assessment: 1. Very mild PAH 2. Severe RV failure however cardiac output preserved 3. No evidence of intracardiac shunt Plan/Discussion: Suspect she has near end-stage RV failure due to longstanding PAH. Continue medical therapy. Given normal cardiac output, not sure there is any role for inotropic support at this time. Arvilla Meres, MD 7:49 PM    Assessment: 35 y.o. 35 y.o. [place], [place] female with  1) sickle cell disease  2) anemia  3) pulmonary hypertension  4) CKD stage IIIb  5) diabetes mellitus type 2    Plan:  Tamee's reticulocyte count is not helping her increase her Hb, which has dropped to 6.1 today. We discussed the plusses and minuses of transfusion including concerns w worsening iron overload, increasing blood viscosity and causing immunization to RBC antigens. Her preference is to go ahead and I am in agreement given the downward trend just noted.  Given her CKD her epo production is likely inadequate. While we have an EPO level pending we discussed starting EPO and she is agreeable.  This can be continued through her nephrologist [facility]) after discharge. The nephrologist (whom she just saw for the first time prior to admission) can also start her on oral iron chelation as outpatient.  Will follow with you while inpatient     Lowella Dell, MD 05/31/2020  11:38 AM Medical Oncology and Hematology [facility] [address] [place], [place] [address] Tel. [phone]    Fax. [phone]

## 2020-05-31 NOTE — Progress Notes (Signed)
Progress Note  Patient Name: Pamela Hudson Date of Encounter: 05/31/2020  Consulting Cardiologist: Riley Lam, MD  Subjective   States that her breathing is less tight, no chest pain or sense of palpitations.  Inpatient Medications    Scheduled Meds: . Chlorhexidine Gluconate Cloth  6 each Topical Daily  . darbepoetin (ARANESP) injection - NON-DIALYSIS  60 mcg Subcutaneous Once  . enoxaparin (LOVENOX) injection  30 mg Subcutaneous Q24H  . feeding supplement  237 mL Oral BID BM  . hydroxyurea  1,000 mg Oral Daily  . insulin aspart  0-9 Units Subcutaneous Q4H  . insulin glargine  25 Units Subcutaneous QHS  . multivitamin with minerals  1 tablet Oral Daily  . sodium chloride flush  10-40 mL Intracatheter Q12H  . sodium chloride flush  3 mL Intravenous Q12H  . sodium chloride flush  3 mL Intravenous Q12H  . torsemide  40 mg Oral Daily   Continuous Infusions: . sodium chloride    . sodium chloride    . milrinone 0.125 mcg/kg/min (05/30/20 1226)  . vancomycin 1,250 mg (05/31/20 1115)  . [START ON 06/01/2020] vancomycin     PRN Meds: sodium chloride, sodium chloride, acetaminophen, guaiFENesin-dextromethorphan, ondansetron (ZOFRAN) IV, ondansetron (ZOFRAN) IV, sodium chloride flush, sodium chloride flush, sodium chloride flush   Vital Signs    Vitals:   05/31/20 0002 05/31/20 0319 05/31/20 0444 05/31/20 1126  BP:  (!) 100/57  100/67  Pulse:  (!) 110  (!) 110  Resp:  18  20  Temp:  98.4 F (36.9 C)  98.5 F (36.9 C)  TempSrc:  Oral  Oral  SpO2:  97%    Weight: 64.2 kg  64.2 kg   Height:        Intake/Output Summary (Last 24 hours) at 05/31/2020 1159 Last data filed at 05/31/2020 1139 Gross per 24 hour  Intake 624.6 ml  Output 1200 ml  Net -575.4 ml   Filed Weights   05/30/20 0511 05/31/20 0002 05/31/20 0444  Weight: 63.8 kg 64.2 kg 64.2 kg    Telemetry    Sinus rhythm and sinus tachycardia.  Personally reviewed.  ECG    No ECG  reviewed.  Physical Exam   GEN: No acute distress.   Neck: JVD. Cardiac: RRR, no gallop.  Respiratory:  Clear to auscultation and. GI:  Protuberant, bowel sounds present. MS: No edema; No deformity. Neuro:  Nonfocal. Psych: Alert and oriented x 3. Normal affect.  Labs    Chemistry Recent Labs  Lab 05/28/20 0221 05/29/20 0615 05/29/20 1129 05/29/20 1925 05/30/20 0541 05/30/20 1717 05/31/20 0351 05/31/20 0835  NA 132*   < > 130*   < > 130* 130*  --  130*  K 4.9   < > 5.2*   < > 5.6* 5.5*  --  4.7  CL 107   < > 105  --  104 104  --  104  CO2 17*   < > 18*  --  18* 17*  --  18*  GLUCOSE 170*   < > 168*  --  140* 275*  --  224*  BUN 50*   < > 60*  --  69* 71*  --  67*  CREATININE 1.63*   < > 2.44*   < > 2.53* 2.41*  --  2.14*  CALCIUM 8.5*   < > 8.0*  --  8.2* 8.1*  --  8.2*  PROT 9.5*  --  8.8*  --   --   --  8.4*  --   ALBUMIN 2.8*  --  2.7*  --   --   --  2.5*  --   AST 89*  --  96*  --   --   --  69*  --   ALT 85*  --  82*  --   --   --  67*  --   ALKPHOS 106  --  106  --   --   --  102  --   BILITOT 5.6*  --  6.4*  --   --   --  5.9*  --   GFRNONAA 43*   < > 26*   < > 25* 27*  --  31*  ANIONGAP 8   < > 7  --  8 9  --  8   < > = values in this interval not displayed.     Hematology Recent Labs  Lab 05/29/20 2200 05/30/20 0541 05/30/20 1615 05/31/20 0351  WBC 13.8* 16.1*  --  15.6*  RBC 1.52* 1.53* 1.45* 1.37*  HGB 6.8* 6.8*  --  6.1*  HCT 18.6* 18.6*  --  16.5*  MCV 122.4* 121.6*  --  120.4*  MCH 44.7* 44.4*  --  44.5*  MCHC 36.6* 36.6*  --  37.0*  RDW 16.9* 17.2*  --  17.1*  PLT 183 184  --  179    Cardiac Enzymes Recent Labs  Lab 05/27/20 1604 05/27/20 1840  TROPONINIHS 34* 34*    BNP Recent Labs  Lab 05/27/20 1733 05/29/20 0615  BNP 1,228.5* 3,195.8*     DDimerNo results for input(s): DDIMER in the last 168 hours.   Radiology    CARDIAC CATHETERIZATION  Result Date: 05/29/2020 Findings: RA = 23 RV = 34/20 PA = 35/14 (26) PCW = 16  Fick cardiac output/index = 9.1/5.8 Thermo CO/CI = 6.9/4.3 PVR = 1.1 (Fick) 1.5 (Thermo) Ao sat = 96% PA sat = 66%, 68% High SVC sat = 73% PAPi = 0.9 Assessment: 1. Very mild PAH 2. Severe RV failure however cardiac output preserved 3. No evidence of intracardiac shunt Plan/Discussion: Suspect she has near end-stage RV failure due to longstanding PAH. Continue medical therapy. Given normal cardiac output, not sure there is any role for inotropic support at this time. Arvilla Meres, MD 7:49 PM    Cardiac Studies   Echocardiogram 05/28/2020:  1. 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 The leaflets are  fixed and appear immobile. Leaflet immobility is out of proportion to  annular dilation. Would consider primary leaflet pathology such as  carcinoid involvement in the work-up in  addition to the patient's long history of severe pulmonary hypertension.  The tricuspid valve is abnormal. Tricuspid valve regurgitation is severe.  2. RVSP is severely underestimated in the setting of severe tricuspid  regurgitation. Right ventricular systolic function is severely reduced.  The right ventricular size is severely enlarged. There is normal pulmonary  artery systolic pressure. The  estimated right ventricular systolic pressure is 34.5 mmHg.  3. Left ventricular ejection fraction, by estimation, is 45 to 50%. Left  ventricular ejection fraction by 3D volume is 45 %. The left ventricle has  mildly decreased function. The left ventricle demonstrates global  hypokinesis. Indeterminate diastolic  filling due to E-A fusion. There is the interventricular septum is  flattened in systole and diastole, consistent with right ventricular  pressure and volume overload.  4. Right atrial size was severely dilated.  5. A small pericardial effusion is present. The  pericardial effusion is  circumferential.  6. The mitral valve is grossly normal. Mild to moderate mitral valve  regurgitation. No  evidence of mitral stenosis.  7. The aortic valve is tricuspid. Aortic valve regurgitation is not  visualized. No aortic stenosis is present.  8. The inferior vena cava is dilated in size with <50% respiratory  variability, suggesting right atrial pressure of 15 mmHg.   Comparison(s): Changes from prior study are noted. RV is severely dilated  with severely reduced function. Severe torrential TR is present. LVEF is  mildly reduced 45-50%. RVSP underestimated in setting of severe TR.   Patient Profile     35 y.o. 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  Complicated by runs of SVT, hepatic congestion, DM, and CKD Stage IIIB.  Assessment & Plan    1.  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  Right heart catheterization demonstrates only mild pulmonary hypertension at this point.  She does have severe RV failure however and has been started on milrinone with continued efforts at fluid optimization.  2.  Acute on chronic kidney disease, CKD stage IIIb at baseline.  Creatinine has come down to 2.14.  3.  Severe tricuspid regurgitation.  Tricuspid valve is significantly restricted suggesting primary leaflet pathology in addition to annular dilatation.  4.  Acute on chronic combined heart failure with mild reduction in LVEF in the range of 45 to 50% by most recent assessment.  5.  Staph capitis bacteremia.  Patient afebrile, currently on vancomycin.  Discussed with Dr. Gala Romney.  Plan to continue low-dose milrinone and Demadex 40 mg daily.  Follow urine output and BMET in a.m., may need further adjustments in diuretic dose.  Question whether TEE may be indicated to further evaluate tricuspid valve particular with bacteremia, although would continue to optimize overall situation first.  Signed, Nona Dell, MD  05/31/2020, 11:59 AM

## 2020-05-31 NOTE — Plan of Care (Signed)
?  Problem: Clinical Measurements: ?Goal: Respiratory complications will improve ?Outcome: Progressing ?  ?Problem: Activity: ?Goal: Risk for activity intolerance will decrease ?Outcome: Progressing ?  ?Problem: Nutrition: ?Goal: Adequate nutrition will be maintained ?Outcome: Progressing ?  ?Problem: Coping: ?Goal: Level of anxiety will decrease ?Outcome: Progressing ?  ?

## 2020-05-31 NOTE — Consult Note (Addendum)
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 Course complicated by fever within 2 days of hospital admission and bcx staph capitis   Had right heart cath with mild pulm htn, but severe RV failure with severe tr, currently being fluid optimized  3/16 same time blood draw 2 sets bcx staph capitis; pendign susceptibility 3/17 bcx ngtd 3/18 ucx <10k insignificant growth  She has a port, and severe tr at risk for endocarditis/endovascular infection. Repeat bcx ngtd but we might have to get a tee  Other consideration for fever is her sickle cell anemia. She has mild ldh elevation and tbili elevation, but lower than before (the lft elevation is also confounded by her congestive hepatopathy) and just a small drop in her hct. It doesn't appear she has sickle cell crisis at this time to contribute to fever. We can trend haptoglobin/ldh and get a blood smear to sort this out  Plan: 1. Continue vanc 2. Trend ldh/haptoglobin, get peripheral blood smear to evaluate for hemolysis/sickle cell exacerbation which can contribute to fever 3. It might be difficult to get out of a TEE this admission. She doesn't have much reserve to approach this as a stop antibiotics after 10 days and wait 4.   If no hemolysis/sickle cell concern and tee negative, suspect we can treat her with 2 weeks linezolid and vancomycin port lock and see how she does (although option of 7 day abx with  surveillant culture is an alternative)   I spent 60 minute reviewing data/chart, and coordinating care and >50% direct face to face time providing counseling/discussing diagnostics/treatment plan with patient   Active Problems:   Sickle cell anemia (HCC)   Elevated bilirubin   Leukocytosis   Dyspnea   Acute on chronic right-sided heart failure (HCC)   Chronic right-sided heart failure (HCC)   Pulmonary hypertension (HCC)   CKD (chronic kidney disease) stage 3, GFR 30-59 ml/min (HCC)   DM2 (diabetes mellitus, type 2) (HCC)   Scheduled Meds: . sodium chloride  250 mL Intravenous Once  . acetaminophen  325 mg Oral Once  . Chlorhexidine Gluconate Cloth  6 each Topical Daily  . darbepoetin (ARANESP) injection - NON-DIALYSIS  60 mcg Subcutaneous Once  . diphenhydrAMINE  25 mg Oral Once  . enoxaparin (LOVENOX) injection  30 mg Subcutaneous Q24H  . feeding supplement  237 mL Oral BID BM  . hydroxyurea  1,000 mg Oral Daily  . insulin aspart  0-9 Units Subcutaneous Q4H  . insulin glargine  25 Units Subcutaneous QHS  . multivitamin with minerals  1 tablet Oral Daily  . sodium chloride flush  10-40 mL Intracatheter Q12H  . sodium chloride flush  3 mL Intravenous Q12H  . sodium chloride flush  3 mL Intravenous Q12H  . torsemide  40 mg Oral Daily   Continuous Infusions: . sodium chloride    . sodium chloride    . milrinone 0.125  mcg/kg/min (05/30/20 1226)  . [START ON 06/01/2020] vancomycin     PRN Meds:.sodium chloride, sodium chloride, acetaminophen, guaiFENesin-dextromethorphan, heparin lock flush, heparin lock flush, ondansetron (ZOFRAN) IV, ondansetron (ZOFRAN) IV, sodium chloride flush, sodium chloride flush, sodium chloride flush, sodium chloride flush, sodium chloride flush  HPI: Pamela Hudson is a 34 y.o. female sickle cell anemia, chronic heart failure stage d [facility] 2-3, ckd3, dm2, pulm htn, admitted 3/15 with dry cough, sob 1 week found to have heart failure, course complicated  by sepsis   Patient had sx above. So far this admission treated with diuresis and also milrinone was started the past 24 hours. She is improving in cough/sob  However, she developed fever 3/16 (1 day after admission) and then again 3/18 She had bcx on 3/16 with staph capitis Cefazolin was was started on 3/18 after wbc continues to rise, however she has recurrent fever on 3/18 and was transitioned to vancomycin on 3/19  She continues to feel better with breathing. No focal pain  She said she could feel fever when it occurred here Chart review also showed she had dropping hemoglobin and elevated lft along with ldh. She is to get prbc today  Previously with sickle cell crisis, she remembers having fever and lower back pain. She doesn't have lower back pain or myalgia/joint pain  Her cxr at admission suggest pulm vasc congestion along with right plueral effusion/atelectasis  She has abd u/s that showed steatosis  She is on room air and again minimal cough now  Initial covid screen  Negative  Her port was placed previously for iv support during sickle cell crisis. It is assessed every 3 month. No recent use outpatient. No issue with it   Review of Systems: ROS Other ros negative  Past Medical History:  Diagnosis Date  . Atrial fibrillation (HCC)   . Cardiomegaly   . CHF (congestive heart failure) (HCC)   . Chronic kidney disease (CKD), stage III (moderate) (HCC)   . Chronic pain   . COVID-19   . CVA (cerebral infarction) 02/2002  . Diabetes mellitus without complication (HCC)   . Hypertension   . Liver disease    ?iron  . PCOS (polycystic ovarian syndrome)   . Renal stone   . Sickle cell anemia (HCC)   . Sleep apnea     Social History   Tobacco Use  . Smoking status: Former Smoker    Types: Cigarettes  . Smokeless tobacco: Never Used  Vaping Use  . Vaping Use: Never used  Substance Use Topics  . Alcohol use: Not Currently  . Drug use: No    Family History   Problem Relation Age of Onset  . Diabetes Mellitus II Father   . CAD Father   . Diabetes Mellitus II Paternal Grandmother    Allergies  Allergen Reactions  . Oxycodone-Acetaminophen Swelling and Other (See Comments)    Lips swell  . Prednisone Other (See Comments)    Elevates BGL    OBJECTIVE: Blood pressure 100/67, pulse (!) 110, temperature 98.5 F (36.9 C), temperature source Oral, resp. rate 20, height 4\' 11"  (1.499 m), weight 64.2 kg, SpO2 97 %.  Physical Exam Constitutional:      Appearance: Normal appearance.  HENT:     Head: Normocephalic.     Mouth/Throat:     Mouth: Mucous membranes are moist.     Pharynx: Oropharynx is clear.  Eyes:     Conjunctiva/sclera: Conjunctivae normal.     Pupils: Pupils are equal,  round, and reactive to light.  Cardiovascular:     Rate and Rhythm: Regular rhythm. Tachycardia present.     Heart sounds: No murmur heard.   Pulmonary:     Effort: Pulmonary effort is normal.     Breath sounds: Rhonchi present.     Comments: Slight crackle / rhonchi rll Abdominal:     Palpations: Abdomen is soft.  Musculoskeletal:        General: Normal range of motion.     Cervical back: Normal range of motion.  Skin:    General: Skin is warm.     Findings: No rash.     Comments: Trace bilateral edema  Right chest port site no swelling/fluctuance/purulence/tenderness  Neurological:     General: No focal deficit present.     Mental Status: She is alert and oriented to person, place, and time.     Cranial Nerves: No cranial nerve deficit.     Motor: No weakness.  Psychiatric:        Mood and Affect: Mood normal.     Lab Results Lab Results  Component Value Date   WBC 15.6 (H) 05/31/2020   HGB 6.1 (LL) 05/31/2020   HCT 16.5 (L) 05/31/2020   MCV 120.4 (H) 05/31/2020   PLT 179 05/31/2020    Lab Results  Component Value Date   CREATININE 2.14 (H) 05/31/2020   BUN 67 (H) 05/31/2020   NA 130 (L) 05/31/2020   K 4.7 05/31/2020   CL 104  05/31/2020   CO2 18 (L) 05/31/2020    Lab Results  Component Value Date   ALT 67 (H) 05/31/2020   AST 69 (H) 05/31/2020   ALKPHOS 102 05/31/2020   BILITOT 5.9 (H) 05/31/2020     Microbiology: Recent Results (from the past 240 hour(s))  Resp Panel by RT-PCR (Flu A&B, Covid) Nasopharyngeal Swab     Status: None   Collection Time: 05/27/20  4:26 PM   Specimen: Nasopharyngeal Swab; Nasopharyngeal(NP) swabs in vial transport medium  Result Value Ref Range Status   SARS Coronavirus 2 by RT PCR NEGATIVE NEGATIVE Final    Comment: (NOTE) SARS-CoV-2 target nucleic acids are NOT DETECTED.  The SARS-CoV-2 RNA is generally detectable in upper respiratory specimens during the acute phase of infection. The lowest concentration of SARS-CoV-2 viral copies this assay can detect is 138 copies/mL. A negative result does not preclude SARS-Cov-2 infection and should not be used as the sole basis for treatment or other patient management decisions. A negative result may occur with  improper specimen collection/handling, submission of specimen other than nasopharyngeal swab, presence of viral mutation(s) within the areas targeted by this assay, and inadequate number of viral copies(<138 copies/mL). A negative result must be combined with clinical observations, patient history, and epidemiological information. The expected result is Negative.  Fact Sheet for Patients:  BloggerCourse.com  Fact Sheet for Healthcare Providers:  SeriousBroker.it  This test is no t yet approved or cleared by the [place] FDA and  has been authorized for detection and/or diagnosis of SARS-CoV-2 by FDA under an Emergency Use Authorization (EUA). This EUA will remain  in effect (meaning this test can be used) for the duration of the COVID-19 declaration under Section 564(b)(1) of the Act, 21 U.S.C.section 360bbb-3(b)(1), unless the authorization is terminated  or  revoked sooner.       Influenza A by PCR NEGATIVE NEGATIVE Final   Influenza B by PCR NEGATIVE NEGATIVE Final    Comment: (NOTE) The Xpert Xpress SARS-CoV-2/FLU/RSV  plus assay is intended as an aid in the diagnosis of influenza from Nasopharyngeal swab specimens and should not be used as a sole basis for treatment. Nasal washings and aspirates are unacceptable for Xpert Xpress SARS-CoV-2/FLU/RSV testing.  Fact Sheet for Patients: BloggerCourse.comhttps://www.fda.gov/media/152166/download  Fact Sheet for Healthcare Providers: SeriousBroker.ithttps://www.fda.gov/media/152162/download  This test is not yet approved or cleared by the [place] FDA and has been authorized for detection and/or diagnosis of SARS-CoV-2 by FDA under an Emergency Use Authorization (EUA). This EUA will remain in effect (meaning this test can be used) for the duration of the COVID-19 declaration under Section 564(b)(1) of the Act, 21 U.S.C. section 360bbb-3(b)(1), unless the authorization is terminated or revoked.  Performed at [facility] [place], [address] [place], [place] [address]   Culture, blood (routine x 2)     Status: Abnormal (Preliminary result)   Collection Time: 05/28/20 10:45 PM   Specimen: BLOOD RIGHT HAND  Result Value Ref Range Status   Specimen Description BLOOD RIGHT HAND  Final   Special Requests   Final    BOTTLES DRAWN AEROBIC ONLY Blood Culture adequate volume   Culture  Setup Time   Final    GRAM POSITIVE COCCI IN CLUSTERS AEROBIC BOTTLE ONLY CRITICAL VALUE NOTED.  VALUE IS CONSISTENT WITH PREVIOUSLY REPORTED AND CALLED VALUE. Performed at [facility], [address] [place], [place] [address]    Culture STAPHYLOCOCCUS CAPITIS (A)  Final   Report Status PENDING  Incomplete  Culture, blood (routine x 2)     Status: Abnormal (Preliminary result)   Collection Time: 05/28/20 10:45 PM   Specimen: BLOOD LEFT HAND  Result Value Ref Range Status   Specimen Description BLOOD LEFT HAND  Final    Special Requests   Final    BOTTLES DRAWN AEROBIC ONLY Blood Culture results may not be optimal due to an inadequate volume of blood received in culture bottles   Culture  Setup Time   Final    GRAM POSITIVE COCCI IN CLUSTERS AEROBIC BOTTLE ONLY Organism ID to follow CRITICAL RESULT CALLED TO, READ BACK BY AND VERIFIED WITH: C. AMEND PHARMD, AT 0550 05/30/20 BY D VANHOOK    Culture (A)  Final    STAPHYLOCOCCUS CAPITIS SUSCEPTIBILITIES TO FOLLOW Performed at [facility], [address] [place], [place] [address]    Report Status PENDING  Incomplete  Blood Culture ID Panel (Reflexed)     Status: Abnormal   Collection Time: 05/28/20 10:45 PM  Result Value Ref Range Status   Enterococcus faecalis NOT DETECTED NOT DETECTED Final   Enterococcus Faecium NOT DETECTED NOT DETECTED Final   Listeria monocytogenes NOT DETECTED NOT DETECTED Final   Staphylococcus species DETECTED (A) NOT DETECTED Final    Comment: CRITICAL RESULT CALLED TO, READ BACK BY AND VERIFIED WITH: C. AMEND PHARMD, AT 0550 05/30/20 BY D VANHOOK    Staphylococcus aureus (BCID) NOT DETECTED NOT DETECTED Final   Staphylococcus epidermidis NOT DETECTED NOT DETECTED Final   Staphylococcus lugdunensis NOT DETECTED NOT DETECTED Final   Streptococcus species NOT DETECTED NOT DETECTED Final   Streptococcus agalactiae NOT DETECTED NOT DETECTED Final   Streptococcus pneumoniae NOT DETECTED NOT DETECTED Final   Streptococcus pyogenes NOT DETECTED NOT DETECTED Final   A.calcoaceticus-baumannii NOT DETECTED NOT DETECTED Final   Bacteroides fragilis NOT DETECTED NOT DETECTED Final   Enterobacterales NOT DETECTED NOT DETECTED Final   Enterobacter cloacae complex NOT DETECTED NOT DETECTED Final   Escherichia coli NOT  DETECTED NOT DETECTED Final   Klebsiella aerogenes NOT DETECTED NOT DETECTED Final   Klebsiella oxytoca NOT DETECTED NOT DETECTED Final   Klebsiella pneumoniae NOT DETECTED NOT DETECTED Final   Proteus species  NOT DETECTED NOT DETECTED Final   Salmonella species NOT DETECTED NOT DETECTED Final   Serratia marcescens NOT DETECTED NOT DETECTED Final   Haemophilus influenzae NOT DETECTED NOT DETECTED Final   Neisseria meningitidis NOT DETECTED NOT DETECTED Final   Pseudomonas aeruginosa NOT DETECTED NOT DETECTED Final   Stenotrophomonas maltophilia NOT DETECTED NOT DETECTED Final   Candida albicans NOT DETECTED NOT DETECTED Final   Candida auris NOT DETECTED NOT DETECTED Final   Candida glabrata NOT DETECTED NOT DETECTED Final   Candida krusei NOT DETECTED NOT DETECTED Final   Candida parapsilosis NOT DETECTED NOT DETECTED Final   Candida tropicalis NOT DETECTED NOT DETECTED Final   Cryptococcus neoformans/gattii NOT DETECTED NOT DETECTED Final    Comment: Performed at [facility], [address], [place], [place] [address]  Culture, blood (routine x 2)     Status: None (Preliminary result)   Collection Time: 05/29/20  2:38 PM   Specimen: BLOOD LEFT HAND  Result Value Ref Range Status   Specimen Description BLOOD LEFT HAND  Final   Special Requests   Final    BOTTLES DRAWN AEROBIC AND ANAEROBIC Blood Culture results may not be optimal due to an inadequate volume of blood received in culture bottles   Culture   Final    NO GROWTH 2 DAYS Performed at [facility], [address] [place], [place] [address]    Report Status PENDING  Incomplete  Culture, blood (routine x 2)     Status: None (Preliminary result)   Collection Time: 05/29/20  2:46 PM   Specimen: BLOOD RIGHT HAND  Result Value Ref Range Status   Specimen Description BLOOD RIGHT HAND  Final   Special Requests   Final    BOTTLES DRAWN AEROBIC ONLY Blood Culture results may not be optimal due to an inadequate volume of blood received in culture bottles   Culture   Final    NO GROWTH 2 DAYS Performed at [facility], [address], [place], [place] [address]    Report Status PENDING  Incomplete  Culture, Urine      Status: Abnormal   Collection Time: 05/30/20  5:41 AM   Specimen: Urine, Catheterized  Result Value Ref Range Status   Specimen Description URINE, CATHETERIZED  Final   Special Requests NONE  Final   Culture (A)  Final    <10,000 COLONIES/mL INSIGNIFICANT GROWTH Performed at [facility], [address] [place], [place] [address]    Report Status 05/31/2020 FINAL  Final     Serology:   Imaging: If present, new imagings (plain films, ct scans, and mri) have been personally visualized and interpreted; radiology reports have been reviewed. Decision making incorporated into the Impression / Recommendations.  3/16 abd u/s 1. Increased liver echotexture compatible with hepatic steatosis. 2. Status post cholecystectomy. 3. 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   3/16 echo 1. 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 The leaflets are  fixed and appear immobile. Leaflet immobility is out of proportion to  annular dilation. Would consider primary leaflet pathology such as  carcinoid involvement in the work-up in  addition to the patient's long history of severe pulmonary hypertension.  The tricuspid valve is  abnormal. Tricuspid valve regurgitation is severe.  2. RVSP is severely underestimated in the setting of severe tricuspid  regurgitation. Right ventricular systolic function is severely reduced.  The right ventricular size is severely enlarged. There is normal pulmonary  artery systolic pressure. The  estimated right ventricular systolic pressure is 34.5 mmHg.  3. Left ventricular ejection fraction, by estimation, is 45 to 50%. Left  ventricular ejection fraction by 3D volume is 45 %. The left ventricle has  mildly decreased function. The left ventricle demonstrates global  hypokinesis. Indeterminate diastolic  filling due to E-A fusion. There is the interventricular septum is  flattened in  systole and diastole, consistent with right ventricular  pressure and volume overload.  4. Right atrial size was severely dilated.  5. A small pericardial effusion is present. The pericardial effusion is  circumferential.  6. The mitral valve is grossly normal. Mild to moderate mitral valve  regurgitation. No evidence of mitral stenosis.  7. The aortic valve is tricuspid. Aortic valve regurgitation is not  visualized. No aortic stenosis is present.  8. The inferior vena cava is dilated in size with <50% respiratory  variability, suggesting right atrial pressure of 15 mmHg.   3/15 cxr 1. Cardiac enlargement, pulmonary vascular congestion, and new right pleural effusion with overlying subsegmental atelectasis. Findings concerning for CHF.   Raymondo Band, MD [facility] [phone] pager    05/31/2020, 2:06 PM

## 2020-05-31 NOTE — Progress Notes (Signed)
Pharmacy Antibiotic Note  Pamela Hudson is a 35 y.o. female admitted on 05/27/2020 with bacteremia.  Pharmacy has been consulted for vancomycin dosing.  Pt was initiated on cefazolin when 3/16 blood cultures grew staph spp 1/4 bottles, now resulted as 2/4 bottles. Pt remains febrile with Tmax 101.2, WBC are up to 15.6. Kidney function has also worsened this admission with SCr on admission ~1.5 (baseline), up to 2.5 now.    Plan: D/c cefazolin Give vancomycin load 1,250mg  IV x1  Initiate vancomycin 750mg  IV every 24 hours - adj if renal function worsens Follow up with cultures, antibiotic de-escalation and LOT Monitor renal function and clinical progress  Height: 4\' 11"  (149.9 cm) Weight: 64.2 kg (141 lb 9.6 oz) IBW/kg (Calculated) : 43.2  Temp (24hrs), Avg:99.6 F (37.6 C), Min:98.4 F (36.9 C), Max:101.2 F (38.4 C)  Recent Labs  Lab 05/27/20 1604 05/27/20 1700 05/28/20 0221 05/29/20 0615 05/29/20 1129 05/29/20 2200 05/30/20 0541 05/30/20 1717 05/31/20 0351  WBC 12.8*  --  12.5*  --   --  13.8* 16.1*  --  15.6*  CREATININE  --    < > 1.63* 2.41* 2.44* 2.49* 2.53* 2.41*  --    < > = values in this interval not displayed.    Estimated Creatinine Clearance: 27.3 mL/min (A) (by C-G formula based on SCr of 2.41 mg/dL (H)).    Allergies  Allergen Reactions  . Oxycodone-Acetaminophen Swelling and Other (See Comments)    Lips swell  . Prednisone Other (See Comments)    Elevates BGL    Antimicrobials this admission: Cefazolin 3/18 >> 3/19 Vancomycin 3/19 >>   Microbiology results: 3/16 BCx: gram positive cocci in clusters >> staph spp 2/4 bottles  3/17 BCx: ng x12h  3/18 UCx: pending   Thank you for allowing pharmacy to be a part of this patient's care.  11-28-1968, PharmD PGY1 Acute Care Pharmacy Resident Please refer to [facility] for unit-specific pharmacist

## 2020-06-01 DIAGNOSIS — I272 Pulmonary hypertension, unspecified: Secondary | ICD-10-CM | POA: Diagnosis not present

## 2020-06-01 DIAGNOSIS — I5082 Biventricular heart failure: Secondary | ICD-10-CM | POA: Diagnosis not present

## 2020-06-01 DIAGNOSIS — D649 Anemia, unspecified: Secondary | ICD-10-CM | POA: Diagnosis not present

## 2020-06-01 DIAGNOSIS — I471 Supraventricular tachycardia: Secondary | ICD-10-CM

## 2020-06-01 DIAGNOSIS — A419 Sepsis, unspecified organism: Secondary | ICD-10-CM

## 2020-06-01 DIAGNOSIS — A411 Sepsis due to other specified staphylococcus: Secondary | ICD-10-CM | POA: Diagnosis not present

## 2020-06-01 DIAGNOSIS — I50812 Chronic right heart failure: Secondary | ICD-10-CM | POA: Diagnosis not present

## 2020-06-01 DIAGNOSIS — D571 Sickle-cell disease without crisis: Secondary | ICD-10-CM

## 2020-06-01 DIAGNOSIS — I50810 Right heart failure, unspecified: Secondary | ICD-10-CM

## 2020-06-01 DIAGNOSIS — I50813 Acute on chronic right heart failure: Secondary | ICD-10-CM | POA: Diagnosis not present

## 2020-06-01 DIAGNOSIS — N1832 Chronic kidney disease, stage 3b: Secondary | ICD-10-CM | POA: Diagnosis not present

## 2020-06-01 DIAGNOSIS — E860 Dehydration: Secondary | ICD-10-CM

## 2020-06-01 DIAGNOSIS — D5780 Other sickle-cell disorders without crisis: Secondary | ICD-10-CM | POA: Diagnosis not present

## 2020-06-01 DIAGNOSIS — R000 Tachycardia, unspecified: Secondary | ICD-10-CM

## 2020-06-01 DIAGNOSIS — R17 Unspecified jaundice: Secondary | ICD-10-CM | POA: Diagnosis not present

## 2020-06-01 DIAGNOSIS — R7881 Bacteremia: Secondary | ICD-10-CM | POA: Diagnosis not present

## 2020-06-01 LAB — CBC WITH DIFFERENTIAL/PLATELET
Abs Immature Granulocytes: 0.22 10*3/uL — ABNORMAL HIGH (ref 0.00–0.07)
Basophils Absolute: 0.1 10*3/uL (ref 0.0–0.1)
Basophils Relative: 1 %
Eosinophils Absolute: 0.2 10*3/uL (ref 0.0–0.5)
Eosinophils Relative: 1 %
HCT: 19.6 % — ABNORMAL LOW (ref 36.0–46.0)
Hemoglobin: 7.4 g/dL — ABNORMAL LOW (ref 12.0–15.0)
Immature Granulocytes: 2 %
Lymphocytes Relative: 21 %
Lymphs Abs: 3 10*3/uL (ref 0.7–4.0)
MCH: 42.5 pg — ABNORMAL HIGH (ref 26.0–34.0)
MCHC: 37.8 g/dL — ABNORMAL HIGH (ref 30.0–36.0)
MCV: 112.6 fL — ABNORMAL HIGH (ref 80.0–100.0)
Monocytes Absolute: 1.7 10*3/uL — ABNORMAL HIGH (ref 0.1–1.0)
Monocytes Relative: 12 %
Neutro Abs: 9.1 10*3/uL — ABNORMAL HIGH (ref 1.7–7.7)
Neutrophils Relative %: 63 %
Platelets: 201 10*3/uL (ref 150–400)
RBC: 1.74 MIL/uL — ABNORMAL LOW (ref 3.87–5.11)
RDW: 23.1 % — ABNORMAL HIGH (ref 11.5–15.5)
WBC: 14.2 10*3/uL — ABNORMAL HIGH (ref 4.0–10.5)
nRBC: 7.4 % — ABNORMAL HIGH (ref 0.0–0.2)

## 2020-06-01 LAB — GLUCOSE, CAPILLARY
Glucose-Capillary: 93 mg/dL (ref 70–99)
Glucose-Capillary: 145 mg/dL — ABNORMAL HIGH (ref 70–99)
Glucose-Capillary: 218 mg/dL — ABNORMAL HIGH (ref 70–99)
Glucose-Capillary: 260 mg/dL — ABNORMAL HIGH (ref 70–99)

## 2020-06-01 LAB — TYPE AND SCREEN
ABO/RH(D): B POS
Antibody Screen: NEGATIVE
Unit division: 0

## 2020-06-01 LAB — BASIC METABOLIC PANEL
Anion gap: 9 (ref 5–15)
BUN: 62 mg/dL — ABNORMAL HIGH (ref 6–20)
CO2: 18 mmol/L — ABNORMAL LOW (ref 22–32)
Calcium: 8.2 mg/dL — ABNORMAL LOW (ref 8.9–10.3)
Chloride: 104 mmol/L (ref 98–111)
Creatinine, Ser: 1.84 mg/dL — ABNORMAL HIGH (ref 0.44–1.00)
GFR, Estimated: 37 mL/min — ABNORMAL LOW (ref >60)
Glucose, Bld: 144 mg/dL — ABNORMAL HIGH (ref 70–99)
Potassium: 4.5 mmol/L (ref 3.5–5.1)
Sodium: 131 mmol/L — ABNORMAL LOW (ref 135–145)

## 2020-06-01 LAB — CULTURE, BLOOD (ROUTINE X 2)

## 2020-06-01 LAB — BPAM RBC
Blood Product Expiration Date: 202203252359
ISSUE DATE / TIME: 202203191526
Unit Type and Rh: 9500

## 2020-06-01 LAB — HEPATIC FUNCTION PANEL
ALT: 37 U/L (ref 0–44)
AST: 59 U/L — ABNORMAL HIGH (ref 15–41)
Albumin: 2.5 g/dL — ABNORMAL LOW (ref 3.5–5.0)
Alkaline Phosphatase: 105 U/L (ref 38–126)
Bilirubin, Direct: 2.2 mg/dL — ABNORMAL HIGH (ref 0.0–0.2)
Indirect Bilirubin: 2.8 mg/dL — ABNORMAL HIGH (ref 0.3–0.9)
Total Bilirubin: 5 mg/dL — ABNORMAL HIGH (ref 0.3–1.2)
Total Protein: 8.6 g/dL — ABNORMAL HIGH (ref 6.5–8.1)

## 2020-06-01 LAB — HAPTOGLOBIN: Haptoglobin: <10 mg/dL — ABNORMAL LOW (ref 33–278)

## 2020-06-01 LAB — MAGNESIUM: Magnesium: 2.2 mg/dL (ref 1.7–2.4)

## 2020-06-01 LAB — ERYTHROPOIETIN: Erythropoietin: 109.7 m[IU]/mL — ABNORMAL HIGH (ref 2.6–18.5)

## 2020-06-01 LAB — MRSA PCR SCREENING: MRSA by PCR: NEGATIVE

## 2020-06-01 LAB — LACTATE DEHYDROGENASE: LDH: 234 U/L — ABNORMAL HIGH (ref 98–192)

## 2020-06-01 MED ORDER — PHENYLEPHRINE HCL-NACL 10-0.9 MG/250ML-% IV SOLN
0.0000 ug/min | INTRAVENOUS | Status: DC
  Administered 2020-06-01: 100 ug/min via INTRAVENOUS
  Administered 2020-06-01: 70 ug/min via INTRAVENOUS
  Administered 2020-06-01: 100 ug/min via INTRAVENOUS
  Administered 2020-06-01: 50 ug/min via INTRAVENOUS
  Filled 2020-06-01 (×3): qty 250

## 2020-06-01 MED ORDER — ADENOSINE 6 MG/2ML IV SOLN: 12.0000 mg | Freq: Once | INTRAVENOUS | Status: AC

## 2020-06-01 MED ORDER — ADENOSINE 6 MG/2ML IV SOLN
6.0000 mg | Freq: Once | INTRAVENOUS | Status: AC
  Administered 2020-06-01: 6 mg via INTRAVENOUS

## 2020-06-01 MED ORDER — AMIODARONE HCL IN DEXTROSE 360-4.14 MG/200ML-% IV SOLN
30.0000 mg/h | INTRAVENOUS | Status: DC
  Administered 2020-06-01: 30 mg/h via INTRAVENOUS

## 2020-06-01 MED ORDER — COLCHICINE 0.6 MG PO TABS
0.6000 mg | ORAL_TABLET | Freq: Once | ORAL | Status: AC
  Administered 2020-06-01: 0.6 mg via ORAL
  Filled 2020-06-01: qty 1

## 2020-06-01 MED ORDER — ADENOSINE 6 MG/2ML IV SOLN
INTRAVENOUS | Status: AC
  Filled 2020-06-01: qty 2

## 2020-06-01 MED ORDER — AMIODARONE LOAD VIA INFUSION
300.0000 mg | Freq: Once | INTRAVENOUS | Status: AC
  Administered 2020-06-01: 300 mg via INTRAVENOUS
  Filled 2020-06-01: qty 166.67

## 2020-06-01 MED ORDER — SODIUM CHLORIDE 0.9 % IV BOLUS
250.0000 mL | Freq: Once | INTRAVENOUS | Status: AC
  Administered 2020-06-01: 250 mL via INTRAVENOUS

## 2020-06-01 MED ORDER — ADENOSINE 6 MG/2ML IV SOLN
INTRAVENOUS | Status: AC
  Administered 2020-06-01: 12 mg
  Filled 2020-06-01: qty 2

## 2020-06-01 MED ORDER — FENTANYL CITRATE (PF) 100 MCG/2ML IJ SOLN
25.0000 ug | Freq: Once | INTRAMUSCULAR | Status: AC
  Administered 2020-06-01: 25 ug via INTRAVENOUS
  Filled 2020-06-01: qty 2

## 2020-06-01 MED ORDER — AMIODARONE HCL IN DEXTROSE 360-4.14 MG/200ML-% IV SOLN
60.0000 mg/h | INTRAVENOUS | Status: AC
  Administered 2020-06-01: 60 mg/h via INTRAVENOUS
  Filled 2020-06-01: qty 400

## 2020-06-01 MED ORDER — SODIUM CHLORIDE 0.9 % IV BOLUS
500.0000 mL | Freq: Once | INTRAVENOUS | Status: AC
  Administered 2020-06-01: 500 mL via INTRAVENOUS

## 2020-06-01 MED ORDER — SODIUM CHLORIDE 0.9 % IV SOLN
INTRAVENOUS | Status: DC | PRN
  Administered 2020-06-01 – 2020-06-04 (×2): 1000 mL via INTRAVENOUS

## 2020-06-01 MED ORDER — AMIODARONE HCL IN DEXTROSE 360-4.14 MG/200ML-% IV SOLN
INTRAVENOUS | Status: AC
  Administered 2020-06-01: 60 mg/h via INTRAVENOUS
  Filled 2020-06-01: qty 200

## 2020-06-01 MED ORDER — MIDAZOLAM HCL 2 MG/2ML IJ SOLN
INTRAMUSCULAR | Status: AC
  Filled 2020-06-01: qty 4

## 2020-06-01 MED ORDER — ADENOSINE 6 MG/2ML IV SOLN
INTRAVENOUS | Status: AC
  Administered 2020-06-01: 6 mg
  Filled 2020-06-01: qty 4

## 2020-06-01 MED ORDER — ADENOSINE 6 MG/2ML IV SOLN
INTRAVENOUS | Status: AC
  Administered 2020-06-01: 6 mg
  Filled 2020-06-01: qty 2

## 2020-06-01 MED ORDER — AMIODARONE LOAD VIA INFUSION
150.0000 mg | Freq: Once | INTRAVENOUS | Status: AC
  Administered 2020-06-01: 150 mg via INTRAVENOUS
  Filled 2020-06-01: qty 83.34

## 2020-06-01 MED ORDER — KETOROLAC TROMETHAMINE 15 MG/ML IJ SOLN
15.0000 mg | Freq: Once | INTRAMUSCULAR | Status: AC
  Administered 2020-06-01: 15 mg via INTRAVENOUS
  Filled 2020-06-01: qty 1

## 2020-06-01 MED ORDER — ADENOSINE 6 MG/2ML IV SOLN
INTRAVENOUS | Status: AC
  Administered 2020-06-01: 12 mg via INTRAVENOUS
  Filled 2020-06-01: qty 4

## 2020-06-01 MED ORDER — VANCOMYCIN HCL 750 MG/150ML IV SOLN
750.0000 mg | INTRAVENOUS | Status: DC
  Administered 2020-06-01: 750 mg via INTRAVENOUS
  Filled 2020-06-01 (×2): qty 150

## 2020-06-01 MED ORDER — FENTANYL CITRATE (PF) 100 MCG/2ML IJ SOLN
INTRAMUSCULAR | Status: AC
  Filled 2020-06-01: qty 2

## 2020-06-01 NOTE — Progress Notes (Signed)
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 Course complicated by fever within 2 days of hospital admission and bcx staph capitis   Had right heart cath with mild pulm htn, but severe RV failure with severe tr, currently being fluid optimized  3/16 same time blood draw 2 sets bcx staph capitis; pendign susceptibility 3/17 bcx ngtd 3/18 ucx <10k insignificant growth  She has a port, and severe tr at risk for endocarditis/endovascular infection. Repeat bcx ngtd but we might have to get a tee  Other consideration for fever is her sickle cell anemia. She has mild ldh elevation and tbili elevation, but lower than before (the lft elevation is also confounded by her congestive hepatopathy) and just a small drop in her hct. It doesn't appear she has sickle cell crisis at this time to contribute to fever. We can trend haptoglobin/ldh and get a blood smear to sort this out  --------------- 3/20 assessment Reviewed labs including ldh and hematology team assessment. No sickle cell crisis Afebrile. Wbc stable Transferred to heart icu due to sinus tach/increased care need Repeat 3/17 bcx ngtd  Would need tee to r/o endocarditis; if negative would treat as presumed port associated infection. At this time however she is tenuous from cardiac function standpoint and is on pressor  Plan: 1. Continue vanc 2. When more stable, please get tee 4.   If tee negative for any vegetation, we can treat her with 2 weeks  linezolid and vancomycin port lock and see how she does (although option of 7 day abx with surveillant culture is an alternative)  I spent more than 25 minute reviewing data/chart, and coordinating care and >50% direct face to face time providing counseling/discussing diagnostics/treatment plan with patient  Active Problems:   Sickle cell anemia (HCC)   Elevated bilirubin   Leukocytosis   Dyspnea   Acute on chronic right-sided heart failure (HCC)   Chronic right-sided heart failure (HCC)   Pulmonary hypertension (HCC)   CKD (chronic kidney disease)   DM2 (diabetes mellitus, type 2) (HCC)   Bacteremia   SVT (supraventricular tachycardia) (HCC)   Scheduled Meds: . adenosine (ADENOCARD) IV  6 mg Intravenous Once  . Chlorhexidine Gluconate Cloth  6 each Topical Daily  . enoxaparin (LOVENOX) injection  30 mg Subcutaneous Q24H  . feeding supplement  237 mL Oral BID BM  . fentaNYL      . hydroxyurea  1,000 mg Oral Daily  . insulin aspart  0-9 Units Subcutaneous Q4H  . insulin glargine  25 Units Subcutaneous QHS  . multivitamin with minerals  1 tablet Oral Daily  . sodium chloride flush  10-40 mL Intracatheter Q12H   Continuous Infusions: . amiodarone 60 mg/hr (06/01/20 1258)  . amiodarone    . phenylephrine (  NEO-SYNEPHRINE) Adult infusion 70 mcg/min (06/01/20 1316)   PRN Meds:.acetaminophen, guaiFENesin-dextromethorphan, heparin lock flush, heparin lock flush, ondansetron (ZOFRAN) IV, ondansetron (ZOFRAN) IV, sodium chloride flush, sodium chloride flush   SUBJECTIVE: This morning svt and sob. Thought to potentially been over diursesis. Transferred to icu for svt and brief drop in sbp. On amio/neosynephrine. Milrinone stopped  Remains afebrile since 3/18. Repeat bcx so far negative No n/v/diarrhea/rash No dyspnea  Review of Systems: ROS Other ros negative  Allergies  Allergen Reactions  . Oxycodone-Acetaminophen Swelling and Other (See Comments)    Lips swell  .  Prednisone Other (See Comments)    Elevates BGL    OBJECTIVE: Vitals:   06/01/20 0351 06/01/20 0705 06/01/20 0900 06/01/20 1100  BP: 100/62  (!) 80/60   Pulse: (!) 106  (!) 195   Resp: 20     Temp: 97.8 F (36.6 C)   97.9 F (36.6 C)  TempSrc: Oral     SpO2: 97%     Weight:  64.8 kg    Height:       Body mass index is 28.85 kg/m.  Physical Exam Conversant, no distress Heent: normocephalic; per; conj clear Neck supple cv tachy/regular; no m/r/g Lungs clear; normal respiratory effort abd s/nt Ext trace pedal edema Neuro nonfocal Psych alert/oriented  Lab Results Lab Results  Component Value Date   WBC 14.2 (H) 06/01/2020   HGB 7.4 (L) 06/01/2020   HCT 19.6 (L) 06/01/2020   MCV 112.6 (H) 06/01/2020   PLT 201 06/01/2020    Lab Results  Component Value Date   CREATININE 1.84 (H) 06/01/2020   BUN 62 (H) 06/01/2020   NA 131 (L) 06/01/2020   K 4.5 06/01/2020   CL 104 06/01/2020   CO2 18 (L) 06/01/2020    Lab Results  Component Value Date   ALT 37 06/01/2020   AST 59 (H) 06/01/2020   ALKPHOS 105 06/01/2020   BILITOT 5.0 (H) 06/01/2020     Microbiology: Recent Results (from the past 240 hour(s))  Resp Panel by RT-PCR (Flu A&B, Covid) Nasopharyngeal Swab     Status: None   Collection Time: 05/27/20  4:26 PM   Specimen: Nasopharyngeal Swab; Nasopharyngeal(NP) swabs in vial transport medium  Result Value Ref Range Status   SARS Coronavirus 2 by RT PCR NEGATIVE NEGATIVE Final    Comment: (NOTE) SARS-CoV-2 target nucleic acids are NOT DETECTED.  The SARS-CoV-2 RNA is generally detectable in upper respiratory specimens during the acute phase of infection. The lowest concentration of SARS-CoV-2 viral copies this assay can detect is 138 copies/mL. A negative result does not preclude SARS-Cov-2 infection and should not be used as the sole basis for treatment or other patient management decisions. A negative result may occur with  improper specimen  collection/handling, submission of specimen other than nasopharyngeal swab, presence of viral mutation(s) within the areas targeted by this assay, and inadequate number of viral copies(<138 copies/mL). A negative result must be combined with clinical observations, patient history, and epidemiological information. The expected result is Negative.  Fact Sheet for Patients:  BloggerCourse.com  Fact Sheet for Healthcare Providers:  SeriousBroker.it  This test is no t yet approved or cleared by the [place] FDA and  has been authorized for detection and/or diagnosis of SARS-CoV-2 by FDA under an Emergency Use Authorization (EUA). This EUA will remain  in effect (meaning this test can be used) for the duration of the COVID-19 declaration under Section 564(b)(1) of the Act, 21 U.S.C.section 360bbb-3(b)(1), unless  the authorization is terminated  or revoked sooner.       Influenza A by PCR NEGATIVE NEGATIVE Final   Influenza B by PCR NEGATIVE NEGATIVE Final    Comment: (NOTE) The Xpert Xpress SARS-CoV-2/FLU/RSV plus assay is intended as an aid in the diagnosis of influenza from Nasopharyngeal swab specimens and should not be used as a sole basis for treatment. Nasal washings and aspirates are unacceptable for Xpert Xpress SARS-CoV-2/FLU/RSV testing.  Fact Sheet for Patients: BloggerCourse.com  Fact Sheet for Healthcare Providers: SeriousBroker.it  This test is not yet approved or cleared by the [place] FDA and has been authorized for detection and/or diagnosis of SARS-CoV-2 by FDA under an Emergency Use Authorization (EUA). This EUA will remain in effect (meaning this test can be used) for the duration of the COVID-19 declaration under Section 564(b)(1) of the Act, 21 U.S.C. section 360bbb-3(b)(1), unless the authorization is terminated or revoked.  Performed at [facility], [address] [place], [place] [address]   Culture, blood (routine x 2)     Status: Abnormal   Collection Time: 05/28/20 10:45 PM   Specimen: BLOOD RIGHT HAND  Result Value Ref Range Status   Specimen Description BLOOD RIGHT HAND  Final   Special Requests   Final    BOTTLES DRAWN AEROBIC ONLY Blood Culture adequate volume   Culture  Setup Time   Final    GRAM POSITIVE COCCI IN CLUSTERS AEROBIC BOTTLE ONLY CRITICAL VALUE NOTED.  VALUE IS CONSISTENT WITH PREVIOUSLY REPORTED AND CALLED VALUE.    Culture (A)  Final    STAPHYLOCOCCUS CAPITIS SUSCEPTIBILITIES PERFORMED ON PREVIOUS CULTURE WITHIN THE LAST 5 DAYS. Performed at [facility], [address] [place], [place] [address]    Report Status 06/01/2020 FINAL  Final  Culture, blood (routine x 2)     Status: Abnormal   Collection Time: 05/28/20 10:45 PM   Specimen: BLOOD LEFT HAND  Result Value Ref Range Status   Specimen Description BLOOD LEFT HAND  Final   Special Requests   Final    BOTTLES DRAWN AEROBIC ONLY Blood Culture results may not be optimal due to an inadequate volume of blood received in culture bottles   Culture  Setup Time   Final    GRAM POSITIVE COCCI IN CLUSTERS AEROBIC BOTTLE ONLY Organism ID to follow CRITICAL RESULT CALLED TO, READ BACK BY AND VERIFIED WITH: C. AMEND PHARMD, AT 0550 05/30/20 BY D VANHOOK Performed at [facility], [address], [place], [place] [address]    Culture STAPHYLOCOCCUS CAPITIS (A)  Final   Report Status 06/01/2020 FINAL  Final   Organism ID, Bacteria STAPHYLOCOCCUS CAPITIS  Final      Susceptibility   Staphylococcus capitis - MIC*    CIPROFLOXACIN <=0.5 SENSITIVE Sensitive     ERYTHROMYCIN >=8 RESISTANT Resistant     GENTAMICIN <=0.5 SENSITIVE Sensitive     OXACILLIN <=0.25 SENSITIVE Sensitive     TETRACYCLINE <=1 SENSITIVE Sensitive     VANCOMYCIN <=0.5 SENSITIVE Sensitive     TRIMETH/SULFA <=10 SENSITIVE Sensitive     CLINDAMYCIN <=0.25  SENSITIVE Sensitive     RIFAMPIN <=0.5 SENSITIVE Sensitive     Inducible Clindamycin NEGATIVE Sensitive     * STAPHYLOCOCCUS CAPITIS  Blood Culture ID Panel (Reflexed)     Status: Abnormal   Collection Time: 05/28/20 10:45 PM  Result Value Ref Range Status   Enterococcus faecalis NOT DETECTED NOT DETECTED Final   Enterococcus  Faecium NOT DETECTED NOT DETECTED Final   Listeria monocytogenes NOT DETECTED NOT DETECTED Final   Staphylococcus species DETECTED (A) NOT DETECTED Final    Comment: CRITICAL RESULT CALLED TO, READ BACK BY AND VERIFIED WITH: C. AMEND PHARMD, AT 0550 05/30/20 BY D VANHOOK    Staphylococcus aureus (BCID) NOT DETECTED NOT DETECTED Final   Staphylococcus epidermidis NOT DETECTED NOT DETECTED Final   Staphylococcus lugdunensis NOT DETECTED NOT DETECTED Final   Streptococcus species NOT DETECTED NOT DETECTED Final   Streptococcus agalactiae NOT DETECTED NOT DETECTED Final   Streptococcus pneumoniae NOT DETECTED NOT DETECTED Final   Streptococcus pyogenes NOT DETECTED NOT DETECTED Final   A.calcoaceticus-baumannii NOT DETECTED NOT DETECTED Final   Bacteroides fragilis NOT DETECTED NOT DETECTED Final   Enterobacterales NOT DETECTED NOT DETECTED Final   Enterobacter cloacae complex NOT DETECTED NOT DETECTED Final   Escherichia coli NOT DETECTED NOT DETECTED Final   Klebsiella aerogenes NOT DETECTED NOT DETECTED Final   Klebsiella oxytoca NOT DETECTED NOT DETECTED Final   Klebsiella pneumoniae NOT DETECTED NOT DETECTED Final   Proteus species NOT DETECTED NOT DETECTED Final   Salmonella species NOT DETECTED NOT DETECTED Final   Serratia marcescens NOT DETECTED NOT DETECTED Final   Haemophilus influenzae NOT DETECTED NOT DETECTED Final   Neisseria meningitidis NOT DETECTED NOT DETECTED Final   Pseudomonas aeruginosa NOT DETECTED NOT DETECTED Final   Stenotrophomonas maltophilia NOT DETECTED NOT DETECTED Final   Candida albicans NOT DETECTED NOT DETECTED Final   Candida  auris NOT DETECTED NOT DETECTED Final   Candida glabrata NOT DETECTED NOT DETECTED Final   Candida krusei NOT DETECTED NOT DETECTED Final   Candida parapsilosis NOT DETECTED NOT DETECTED Final   Candida tropicalis NOT DETECTED NOT DETECTED Final   Cryptococcus neoformans/gattii NOT DETECTED NOT DETECTED Final    Comment: Performed at [facility], [address] [place], [place] [address]  Culture, blood (routine x 2)     Status: None (Preliminary result)   Collection Time: 05/29/20  2:38 PM   Specimen: BLOOD LEFT HAND  Result Value Ref Range Status   Specimen Description BLOOD LEFT HAND  Final   Special Requests   Final    BOTTLES DRAWN AEROBIC AND ANAEROBIC Blood Culture results may not be optimal due to an inadequate volume of blood received in culture bottles   Culture   Final    NO GROWTH 2 DAYS Performed at [facility], [address] [place], [place] [address]    Report Status PENDING  Incomplete  Culture, blood (routine x 2)     Status: None (Preliminary result)   Collection Time: 05/29/20  2:46 PM   Specimen: BLOOD RIGHT HAND  Result Value Ref Range Status   Specimen Description BLOOD RIGHT HAND  Final   Special Requests   Final    BOTTLES DRAWN AEROBIC ONLY Blood Culture results may not be optimal due to an inadequate volume of blood received in culture bottles   Culture   Final    NO GROWTH 2 DAYS Performed at [facility], [address] [place], [place] [address]    Report Status PENDING  Incomplete  Culture, Urine     Status: Abnormal   Collection Time: 05/30/20  5:41 AM   Specimen: Urine, Catheterized  Result Value Ref Range Status   Specimen Description URINE, CATHETERIZED  Final   Special Requests NONE  Final   Culture (A)  Final    <10,000 COLONIES/mL  INSIGNIFICANT GROWTH Performed at [facility], [address] [place], [place] [id number]    Report Status 05/31/2020 FINAL  Final    Serology:  Imaging: If present, new  imagings (plain films, ct scans, and mri) have been personally visualized and interpreted; radiology reports have been reviewed. Decision making incorporated into the Impression / Recommendations.   3/16 abd u/s 1. Increased liver echotexture compatible with hepatic steatosis. 2. Status post cholecystectomy. 3. 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   3/16 echo 1. 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 The leaflets are  fixed and appear immobile. Leaflet immobility is out of proportion to  annular dilation. Would consider primary leaflet pathology such as  carcinoid involvement in the work-up in  addition to the patient's long history of severe pulmonary hypertension.  The tricuspid valve is abnormal. Tricuspid valve regurgitation is severe.  2. RVSP is severely underestimated in the setting of severe tricuspid  regurgitation. Right ventricular systolic function is severely reduced.  The right ventricular size is severely enlarged. There is normal pulmonary  artery systolic pressure. The  estimated right ventricular systolic pressure is 34.5 mmHg.  3. Left ventricular ejection fraction, by estimation, is 45 to 50%. Left  ventricular ejection fraction by 3D volume is 45 %. The left ventricle has  mildly decreased function. The left ventricle demonstrates global  hypokinesis. Indeterminate diastolic  filling due to E-A fusion. There is the interventricular septum is  flattened in systole and diastole, consistent with right ventricular  pressure and volume overload.  4. Right atrial size was severely dilated.  5. A small pericardial effusion is present. The pericardial effusion is  circumferential.  6. The mitral valve is grossly normal. Mild to moderate mitral valve  regurgitation. No evidence of mitral stenosis.  7. The aortic valve is tricuspid. Aortic valve regurgitation is not  visualized. No  aortic stenosis is present.  8. The inferior vena cava is dilated in size with <50% respiratory  variability, suggesting right atrial pressure of 15 mmHg.   3/15 cxr 1. Cardiac enlargement, pulmonary vascular congestion, and new right pleural effusion with overlying subsegmental atelectasis. Findings concerning for CHF.  Raymondo Bandrung T Chariah Bailey, MD [facility] [phone] pager    06/01/2020, 1:31 PM

## 2020-06-01 NOTE — Plan of Care (Signed)

## 2020-06-01 NOTE — Consult Note (Signed)
NAME:  Pamela Hudson, MRN:  [id number], DOB:  1985-05-30, LOS: 5 ADMISSION DATE:  05/27/2020, CONSULTATION DATE:  06/01/20 REFERRING MD:  Lupita Dawn MD, CHIEF COMPLAINT:  Shock, SVT  History of Present Illness:   35 year old with sickle cell, pulmonary hypertension, CKD, diabetes admitted with dyspnea, CHF worsening pulmonary hypertension with RV failure, severe tricuspid regurgitation, staph bactermia.  Being followed by cardiology on milrinone  Went into SVT today.  Rapid response called.  She failed vagal maneuvers, given adenosine x 4, started on IV amnio with conversion to normal sinus rhythm.  Neo-Synephrine started for hypotension.  PCCM consulted  Pertinent  Medical History   Cardiomegaly, CHF, chronic pain, CVA, HTN, PCOS, sickle cell anemia, DM2, chronic right heart strain, pulmonary Hypertension, CKD stage 3, HTN, transient A.fibCOVID 2020  Significant Hospital Events: Including procedures, antibiotic start and stop dates in addition to other pertinent events   3/15-Admit 3/16-Staph capitis bacteremia 3/17-underwent right heart cath with findings of very mild PAH, severe RV failure with preserved cardiac output.  No evidence of intracardiac shunt 3/18- Started milrinone and diuresed 3/19 Transfuse PRBC, ID consulted and vancomycin started 3/20- SVT > to ICU  Interim History / Subjective:   Feels better.  Denies any dyspnea, chest  Objective   Blood pressure (!) 80/60, pulse (!) 195, temperature 97.9 F (36.6 C), resp. rate 20, height _0  (1.499 m), weight 64.8 kg, SpO2 97 %.        Intake/Output Summary (Last 24 hours) at 06/01/2020 1308 Last data filed at 06/01/2020 4270 Gross per 24 hour  Intake 847.83 ml  Output 1770 ml  Net -922.17 ml   Filed Weights   05/31/20 0002 05/31/20 0444 06/01/20 0705  Weight: 64.2 kg 64.2 kg 64.8 kg    Examination: Gen:      No acute distress HEENT:  EOMI, sclera anicteric Neck:     No masses; no thyromegaly Lungs:    Clear to  auscultation bilaterally; normal respiratory effort CV:         Regular rate and rhythm; no murmurs Abd:      + bowel sounds; soft, non-tender; no palpable masses, no distension Ext:    No edema; adequate peripheral perfusion Skin:      Warm and dry; no rash Neuro: alert and oriented x 3 Psych: normal mood and affect  Labs/imaging personally reviewed    Echo 3/16-severe torrential tricuspid regurgitation with severe RV enlargement.  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  Holding Lasix due to hypotension Wean off Neo-Synephrine Continue IV amiodarone  Staph capitis bacteremia Discussed with ID.  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 Follow CBC  Best practice (evaluated daily)  Diet:  Oral Pain/Anxiety/Delirium protocol (if indicated): No VAP protocol (if indicated): Not indicated DVT prophylaxis: LMWH GI prophylaxis: N/A Glucose control:  SSI Yes and Basal insulin Yes Central venous access:  N/A Arterial line:  N/A Foley:  N/A Mobility:  bed rest  PT consulted: N/A Last date of multidisciplinary goals of care discussion [NA] Code Status:  full  code Disposition: ICU  Labs   CBC: Recent Labs  Lab 05/27/20 1604 05/28/20 0221 05/29/20 1925 05/29/20 2200 05/30/20 0541 05/31/20 0351 05/31/20 2130 06/01/20 0500  WBC 12.8* 12.5*  --  13.8* 16.1* 15.6* 14.6* 14.2*  NEUTROABS 7.9* 7.5  --   --   --  10.5*  --  9.1*  HGB 7.8* 7.4*   < > 6.8* 6.8* 6.1* 7.7* 7.4*  HCT 20.9* 19.9*   <  > 18.6* 18.6* 16.5* 20.6* 19.6*  MCV 122.2* 122.8*  --  122.4* 121.6* 120.4* 113.2* 112.6*  PLT 222 194  --  183 184 179 209 201   < > = values in this interval not displayed.    Basic Metabolic Panel: Recent Labs  Lab 05/28/20 0221 05/29/20 0615 05/29/20 1129 05/29/20 1925 05/29/20 1935 05/29/20 2200 05/30/20 0541 05/30/20 1717 05/31/20 0835 06/01/20 1017  NA 132*   < > 130*   < > 141  --  130* 130* 130* 131*  K 4.9   < > 5.2*   < > 4.8  --  5.6* 5.5* 4.7 4.5  CL 107   < > 105  --   --   --  104 104 104 104  CO2 17*   < > 18*  --   --   --  18* 17* 18* 18*  GLUCOSE 170*   < > 168*  --   --   --  140* 275* 224* 144*  BUN 50*   < > 60*  --   --   --  69* 71* 67* 62*  CREATININE 1.63*   < > 2.44*  --   --  2.49* 2.53* 2.41* 2.14* 1.84*  CALCIUM 8.5*   < > 8.0*  --   --   --  8.2* 8.1* 8.2* 8.2*  MG 2.4  --   --   --   --   --   --   --   --  2.2  PHOS 4.5  --   --   --   --   --   --   --   --   --    < > = values in this interval not displayed.   GFR: Estimated Creatinine Clearance: 35.9 mL/min (A) (by C-G formula based on SCr of 1.84 mg/dL (H)). Recent Labs  Lab 05/30/20 0541 05/31/20 0351 05/31/20 2130 06/01/20 0500  WBC 16.1* 15.6* 14.6* 14.2*    Liver Function Tests: Recent Labs  Lab 05/27/20 1700 05/28/20 0221 05/29/20 1129 05/31/20 0351 06/01/20 0500  AST 98* 89* 96* 69* 59*  ALT 88* 85* 82* 67* 37  ALKPHOS 118 106 106 102 105  BILITOT 5.0* 5.6* 6.4* 5.9* 5.0*  PROT 9.4* 9.5* 8.8* 8.4* 8.6*  ALBUMIN 3.2* 2.8* 2.7* 2.5* 2.5*   Recent Labs  Lab 05/27/20 1700  LIPASE 30   No results for input(s): AMMONIA in the last 168 hours.  ABG    Component Value Date/Time   HCO3 17.2 (L) 05/29/2020 1935   TCO2 18 (L) 05/29/2020 1935   ACIDBASEDEF 8.0 (H) 05/29/2020 1935   O2SAT 73.0 05/29/2020 1935     Coagulation Profile: Recent Labs  Lab 05/28/20 0221  INR 1.4*    Cardiac Enzymes: No results for input(s): CKTOTAL, CKMB, CKMBINDEX, TROPONINI in  the last 168 hours.  HbA1C: Hgb A1c MFr Bld  Date/Time Value Ref Range Status  05/28/2020 02:21 AM 5.2 4.8 - 5.6 % Final    Comment:    (  NOTE)         Prediabetes: 5.7 - 6.4         Diabetes: >6.4         Glycemic control for adults with diabetes: <7.0   08/31/2019 01:52 PM 7.8 (H) 4.8 - 5.6 % Final    Comment:    (NOTE)         Prediabetes: 5.7 - 6.4         Diabetes: >6.4         Glycemic control for adults with diabetes: <7.0     CBG: Recent Labs  Lab 05/31/20 1625 05/31/20 2001 05/31/20 2353 06/01/20 0349 06/01/20 1116  GLUCAP 307* 292* 164* 93 145*    Review of Systems:   REVIEW OF SYSTEMS:   All negative; except for those that are bolded, which indicate positives.  Constitutional: weight loss, weight gain, night sweats, fevers, chills, fatigue, weakness.  HEENT: headaches, sore throat, sneezing, nasal congestion, post nasal drip, difficulty swallowing, tooth/dental problems, visual complaints, visual changes, ear aches. Neuro: difficulty with speech, weakness, numbness, ataxia. CV:  chest pain, orthopnea, PND, swelling in lower extremities, dizziness, palpitations, syncope.  Resp: cough, hemoptysis, dyspnea, wheezing. GI: heartburn, indigestion, abdominal pain, nausea, vomiting, diarrhea, constipation, change in bowel habits, loss of appetite, hematemesis, melena, hematochezia.  GU: dysuria, change in color of urine, urgency or frequency, flank pain, hematuria. MSK: joint pain or swelling, decreased range of motion. Psych: change in mood or affect, depression, anxiety, suicidal ideations, homicidal ideations. Skin: rash, itching, bruising.  Past Medical History:  She,  has a past medical history of Atrial fibrillation ([place]), Cardiomegaly, CHF (congestive heart failure) ([place]), Chronic kidney disease (CKD), stage III (moderate) ([place]), Chronic pain, COVID-19, CVA (cerebral infarction) (02/2002), Diabetes mellitus without complication ([place]), Hypertension, Liver  disease, PCOS (polycystic ovarian syndrome), Renal stone, Sickle cell anemia ([place]), and Sleep apnea.   Surgical History:   Past Surgical History:  Procedure Laterality Date  . CHOLECYSTECTOMY    . LIVER BIOPSY  2007  . PARATHYROIDECTOMY    . PORTACATH PLACEMENT    . RIGHT HEART CATH N/A 09/01/2018   Procedure: RIGHT HEART CATH;  Surgeon: Adrian Prows, MD;  Location: [facility];  Service: Cardiovascular;  Laterality: N/A;  . RIGHT HEART CATH N/A 05/29/2020   Procedure: RIGHT HEART CATH;  Surgeon: Jolaine Artist, MD;  Location: [facility];  Service: Cardiovascular;  Laterality: N/A;     Social History:   reports that she has quit smoking. Her smoking use included cigarettes. She has never used smokeless tobacco. She reports previous alcohol use. She reports that she does not use drugs.   Family History:  Her family history includes CAD in her father; Diabetes Mellitus II in her father and paternal grandmother.   Allergies Allergies  Allergen Reactions  . Oxycodone-Acetaminophen Swelling and Other (See Comments)    Lips swell  . Prednisone Other (See Comments)    Elevates BGL     Home Medications  Prior to Admission medications   Medication Sig Start Date End Date Taking? Authorizing Provider  digoxin (LANOXIN) 0.125 MG tablet Take 1 tablet (0.125 mg total) by mouth daily. 09/05/19  Yes Georgette Shell, MD  empagliflozin (JARDIANCE) 25 MG TABS tablet Take 25 mg by mouth in the morning and at bedtime.   Yes [provider]  Etonogestrel (IMPLANON [place]) Inject 1 each into the skin once. Implanted spring 2019   Yes [provider]  folic acid (FOLVITE)  1 MG tablet Take 1 mg by mouth daily. 11/21/15  Yes [provider]  hydroxyurea (HYDREA) 500 MG capsule Take 1,000 mg by mouth daily.    Yes [provider]  insulin aspart (NOVOLOG FLEXPEN) 100 UNIT/ML FlexPen Inject 5 Units into the skin 3 (three) times daily with meals. Patient  taking differently: Inject 5 Units into the skin 3 (three) times daily with meals. Sliding scale 09/04/19  Yes Georgette Shell, MD  insulin glargine (LANTUS) 100 unit/mL SOPN Inject 0.25 mLs (25 Units total) into the skin daily. Patient taking differently: Inject 30 Units into the skin at bedtime. 09/04/19  Yes Georgette Shell, MD  Insulin Pen Needle 32G X 4 MM MISC 5 Units/oz/day by Does not apply route in the morning, at noon, and at bedtime. 09/04/19  Yes Georgette Shell, MD  magnesium oxide (MAG-OX) 400 MG tablet Take 400 mg by mouth daily. 01/26/18  Yes [provider]  metFORMIN (GLUCOPHAGE) 500 MG tablet Take 1,000 mg by mouth 2 (two) times daily with a meal.   Yes [provider]  OXBRYTA 500 MG TABS tablet Take 1,500 tablets by mouth daily. 08/28/19  Yes [provider]  torsemide (DEMADEX) 20 MG tablet Take 1 tablet (20 mg total) by mouth daily. 09/04/19  Yes Georgette Shell, MD  Vitamin D, Ergocalciferol, (DRISDOL) 1.25 MG (50000 UT) CAPS capsule Take 50,000 Units by mouth every 30 (thirty) days. 01/26/18  Yes [provider]  blood glucose meter kit and supplies Dispense based on patient and insurance preference. Use up to four times daily as directed. (FOR ICD-10 E10.9, E11.9). 09/04/19   Georgette Shell, MD  Deferasirox 360 MG TABS Take 720 mg by mouth at bedtime.    [provider]     Critical care time:    The patient is critically ill with multiple organ system failure and requires high complexity decision making for assessment and support, frequent evaluation and titration of therapies, advanced monitoring, review of radiographic studies and interpretation of complex data.   Critical Care Time devoted to patient care services, exclusive of separately billable procedures, described in this note is 45 minutes.   Marshell Garfinkel MD [facility] See Amion for pager  If no response to pager ,  please call [phone] until 7pm After 7:00 pm call Elink  [phone] 06/01/2020, 1:38 PM

## 2020-06-01 NOTE — Progress Notes (Signed)
Pamela Hudson   DOB:1986-02-10   RX#:[id number]   PJK#:[id number]  Subjective:  Pamela Hudson tells me she did "fine" with transfusion yesterday. This AM her heart rate is up and her blood pressure down. EKG shows sinus tach. She is receiving an IVF bolus and plan is to move her to ICU; nurses in room  Objective: African American woman examined in bed Vitals:   05/31/20 1959 06/01/20 0351  BP: 109/74 100/62  Pulse: (!) 108 (!) 106  Resp: 18 20  Temp: 98.4 F (36.9 C) 97.8 F (36.6 C)  SpO2: 100% 97%    Body mass index is 28.85 kg/m.  Intake/Output Summary (Last 24 hours) at 06/01/2020 0901 Last data filed at 06/01/2020 8099 Gross per 24 hour  Intake 1371.76 ml  Output 2170 ml  Net -798.24 ml    Lungs no rales or rhonchi Heart fast rate, regular rhythm Abd soft, nontender Neuro: nonfocal, states "I'm scared" Breasts: Deferred   CBG (last 3)  Recent Labs    05/31/20 2001 05/31/20 2353 06/01/20 0349  GLUCAP 292* 164* 93     Labs:  Lab Results  Component Value Date   WBC 14.2 (H) 06/01/2020   HGB 7.4 (L) 06/01/2020   HCT 19.6 (L) 06/01/2020   MCV 112.6 (H) 06/01/2020   PLT 201 06/01/2020   NEUTROABS 9.1 (H) 06/01/2020    @LASTCHEMISTRY @  Urine Studies No results for input(s): UHGB, CRYS in the last 72 hours.  Invalid input(s): UACOL, UAPR, USPG, UPH, UTP, UGL, UKET, UBIL, UNIT, UROB, ULEU, UEPI, UWBC, URBC, UBAC, CAST, [place], [place]  Basic Metabolic Panel: Recent Labs  Lab 05/28/20 0221 05/29/20 0615 05/29/20 1129 05/29/20 1925 05/29/20 1935 05/29/20 2200 05/30/20 0541 05/30/20 1717 05/31/20 0835  NA 132* 130* 130* 137  138 141  --  130* 130* 130*  K 4.9 5.4* 5.2* 5.3*  5.3* 4.8  --  5.6* 5.5* 4.7  CL 107 104 105  --   --   --  104 104 104  CO2 17* 18* 18*  --   --   --  18* 17* 18*  GLUCOSE 170* 139* 168*  --   --   --  140* 275* 224*  BUN 50* 59* 60*  --   --   --  69* 71* 67*  CREATININE 1.63* 2.41* 2.44*  --   --  2.49* 2.53* 2.41* 2.14*  CALCIUM  8.5* 8.0* 8.0*  --   --   --  8.2* 8.1* 8.2*  MG 2.4  --   --   --   --   --   --   --   --   PHOS 4.5  --   --   --   --   --   --   --   --    GFR Estimated Creatinine Clearance: 30.9 mL/min (A) (by C-G formula based on SCr of 2.14 mg/dL (H)). Liver Function Tests: Recent Labs  Lab 05/27/20 1700 05/28/20 0221 05/29/20 1129 05/31/20 0351 06/01/20 0500  AST 98* 89* 96* 69* 59*  ALT 88* 85* 82* 67* 37  ALKPHOS 118 106 106 102 105  BILITOT 5.0* 5.6* 6.4* 5.9* 5.0*  PROT 9.4* 9.5* 8.8* 8.4* 8.6*  ALBUMIN 3.2* 2.8* 2.7* 2.5* 2.5*   Recent Labs  Lab 05/27/20 1700  LIPASE 30   No results for input(s): AMMONIA in the last 168 hours. Coagulation profile Recent Labs  Lab 05/28/20 0221  INR 1.4*    CBC: Recent Labs  Lab 05/27/20 1604 05/28/20 0221 05/29/20 1925 05/29/20 2200 05/30/20 0541 05/31/20 0351 05/31/20 2130 06/01/20 0500  WBC 12.8* 12.5*  --  13.8* 16.1* 15.6* 14.6* 14.2*  NEUTROABS 7.9* 7.5  --   --   --  10.5*  --  9.1*  HGB 7.8* 7.4*   < > 6.8* 6.8* 6.1* 7.7* 7.4*  HCT 20.9* 19.9*   < > 18.6* 18.6* 16.5* 20.6* 19.6*  MCV 122.2* 122.8*  --  122.4* 121.6* 120.4* 113.2* 112.6*  PLT 222 194  --  183 184 179 209 201   < > = values in this interval not displayed.   Cardiac Enzymes: No results for input(s): CKTOTAL, CKMB, CKMBINDEX, TROPONINI in the last 168 hours. BNP: Invalid input(s): POCBNP CBG: Recent Labs  Lab 05/31/20 1138 05/31/20 1625 05/31/20 2001 05/31/20 2353 06/01/20 0349  GLUCAP 190* 307* 292* 164* 93   D-Dimer No results for input(s): DDIMER in the last 72 hours. Hgb A1c No results for input(s): HGBA1C in the last 72 hours. Lipid Profile No results for input(s): CHOL, HDL, LDLCALC, TRIG, CHOLHDL, LDLDIRECT in the last 72 hours. Thyroid function studies No results for input(s): TSH, T4TOTAL, T3FREE, THYROIDAB in the last 72 hours.  Invalid input(s): FREET3 Anemia work up Recent Labs    05/30/20 1615  RETICCTPCT 7.6*    Microbiology Recent Results (from the past 240 hour(s))  Resp Panel by RT-PCR (Flu A&B, Covid) Nasopharyngeal Swab     Status: None   Collection Time: 05/27/20  4:26 PM   Specimen: Nasopharyngeal Swab; Nasopharyngeal(NP) swabs in vial transport medium  Result Value Ref Range Status   SARS Coronavirus 2 by RT PCR NEGATIVE NEGATIVE Final    Comment: (NOTE) SARS-CoV-2 target nucleic acids are NOT DETECTED.  The SARS-CoV-2 RNA is generally detectable in upper respiratory specimens during the acute phase of infection. The lowest concentration of SARS-CoV-2 viral copies this assay can detect is 138 copies/mL. A negative result does not preclude SARS-Cov-2 infection and should not be used as the sole basis for treatment or other patient management decisions. A negative result may occur with  improper specimen collection/handling, submission of specimen other than nasopharyngeal swab, presence of viral mutation(s) within the areas targeted by this assay, and inadequate number of viral copies(<138 copies/mL). A negative result must be combined with clinical observations, patient history, and epidemiological information. The expected result is Negative.  Fact Sheet for Patients:  BloggerCourse.com  Fact Sheet for Healthcare Providers:  SeriousBroker.it  This test is no t yet approved or cleared by the [place] FDA and  has been authorized for detection and/or diagnosis of SARS-CoV-2 by FDA under an Emergency Use Authorization (EUA). This EUA will remain  in effect (meaning this test can be used) for the duration of the COVID-19 declaration under Section 564(b)(1) of the Act, 21 U.S.C.section 360bbb-3(b)(1), unless the authorization is terminated  or revoked sooner.       Influenza A by PCR NEGATIVE NEGATIVE Final   Influenza B by PCR NEGATIVE NEGATIVE Final    Comment: (NOTE) The Xpert Xpress SARS-CoV-2/FLU/RSV plus assay is  intended as an aid in the diagnosis of influenza from Nasopharyngeal swab specimens and should not be used as a sole basis for treatment. Nasal washings and aspirates are unacceptable for Xpert Xpress SARS-CoV-2/FLU/RSV testing.  Fact Sheet for Patients: BloggerCourse.com  Fact Sheet for Healthcare Providers: SeriousBroker.it  This test is not yet approved or cleared by the [place] FDA and has been authorized for detection  and/or diagnosis of SARS-CoV-2 by FDA under an Emergency Use Authorization (EUA). This EUA will remain in effect (meaning this test can be used) for the duration of the COVID-19 declaration under Section 564(b)(1) of the Act, 21 U.S.C. section 360bbb-3(b)(1), unless the authorization is terminated or revoked.  Performed at [facility], [address] [place], [place] [address]   Culture, blood (routine x 2)     Status: Abnormal (Preliminary result)   Collection Time: 05/28/20 10:45 PM   Specimen: BLOOD RIGHT HAND  Result Value Ref Range Status   Specimen Description BLOOD RIGHT HAND  Final   Special Requests   Final    BOTTLES DRAWN AEROBIC ONLY Blood Culture adequate volume   Culture  Setup Time   Final    GRAM POSITIVE COCCI IN CLUSTERS AEROBIC BOTTLE ONLY CRITICAL VALUE NOTED.  VALUE IS CONSISTENT WITH PREVIOUSLY REPORTED AND CALLED VALUE. Performed at [facility], [address], [place], [place] [address]    Culture STAPHYLOCOCCUS CAPITIS (A)  Final   Report Status PENDING  Incomplete  Culture, blood (routine x 2)     Status: Abnormal (Preliminary result)   Collection Time: 05/28/20 10:45 PM   Specimen: BLOOD LEFT HAND  Result Value Ref Range Status   Specimen Description BLOOD LEFT HAND  Final   Special Requests   Final    BOTTLES DRAWN AEROBIC ONLY Blood Culture results may not be optimal due to an inadequate volume of blood received in culture bottles   Culture  Setup  Time   Final    GRAM POSITIVE COCCI IN CLUSTERS AEROBIC BOTTLE ONLY Organism ID to follow CRITICAL RESULT CALLED TO, READ BACK BY AND VERIFIED WITH: C. AMEND PHARMD, AT 0550 05/30/20 BY D VANHOOK    Culture (A)  Final    STAPHYLOCOCCUS CAPITIS SUSCEPTIBILITIES TO FOLLOW Performed at [facility], [address], [place], [place] [address]    Report Status PENDING  Incomplete  Blood Culture ID Panel (Reflexed)     Status: Abnormal   Collection Time: 05/28/20 10:45 PM  Result Value Ref Range Status   Enterococcus faecalis NOT DETECTED NOT DETECTED Final   Enterococcus Faecium NOT DETECTED NOT DETECTED Final   Listeria monocytogenes NOT DETECTED NOT DETECTED Final   Staphylococcus species DETECTED (A) NOT DETECTED Final    Comment: CRITICAL RESULT CALLED TO, READ BACK BY AND VERIFIED WITH: C. AMEND PHARMD, AT 0550 05/30/20 BY D VANHOOK    Staphylococcus aureus (BCID) NOT DETECTED NOT DETECTED Final   Staphylococcus epidermidis NOT DETECTED NOT DETECTED Final   Staphylococcus lugdunensis NOT DETECTED NOT DETECTED Final   Streptococcus species NOT DETECTED NOT DETECTED Final   Streptococcus agalactiae NOT DETECTED NOT DETECTED Final   Streptococcus pneumoniae NOT DETECTED NOT DETECTED Final   Streptococcus pyogenes NOT DETECTED NOT DETECTED Final   A.calcoaceticus-baumannii NOT DETECTED NOT DETECTED Final   Bacteroides fragilis NOT DETECTED NOT DETECTED Final   Enterobacterales NOT DETECTED NOT DETECTED Final   Enterobacter cloacae complex NOT DETECTED NOT DETECTED Final   Escherichia coli NOT DETECTED NOT DETECTED Final   Klebsiella aerogenes NOT DETECTED NOT DETECTED Final   Klebsiella oxytoca NOT DETECTED NOT DETECTED Final   Klebsiella pneumoniae NOT DETECTED NOT DETECTED Final   Proteus species NOT DETECTED NOT DETECTED Final   Salmonella species NOT DETECTED NOT DETECTED Final   Serratia marcescens NOT DETECTED NOT DETECTED Final   Haemophilus influenzae NOT DETECTED NOT  DETECTED Final   Neisseria  meningitidis NOT DETECTED NOT DETECTED Final   Pseudomonas aeruginosa NOT DETECTED NOT DETECTED Final   Stenotrophomonas maltophilia NOT DETECTED NOT DETECTED Final   Candida albicans NOT DETECTED NOT DETECTED Final   Candida auris NOT DETECTED NOT DETECTED Final   Candida glabrata NOT DETECTED NOT DETECTED Final   Candida krusei NOT DETECTED NOT DETECTED Final   Candida parapsilosis NOT DETECTED NOT DETECTED Final   Candida tropicalis NOT DETECTED NOT DETECTED Final   Cryptococcus neoformans/gattii NOT DETECTED NOT DETECTED Final    Comment: Performed at [facility], [address] [place], [place] [address]  Culture, blood (routine x 2)     Status: None (Preliminary result)   Collection Time: 05/29/20  2:38 PM   Specimen: BLOOD LEFT HAND  Result Value Ref Range Status   Specimen Description BLOOD LEFT HAND  Final   Special Requests   Final    BOTTLES DRAWN AEROBIC AND ANAEROBIC Blood Culture results may not be optimal due to an inadequate volume of blood received in culture bottles   Culture   Final    NO GROWTH 2 DAYS Performed at [facility], [address] [place], [place] [address]    Report Status PENDING  Incomplete  Culture, blood (routine x 2)     Status: None (Preliminary result)   Collection Time: 05/29/20  2:46 PM   Specimen: BLOOD RIGHT HAND  Result Value Ref Range Status   Specimen Description BLOOD RIGHT HAND  Final   Special Requests   Final    BOTTLES DRAWN AEROBIC ONLY Blood Culture results may not be optimal due to an inadequate volume of blood received in culture bottles   Culture   Final    NO GROWTH 2 DAYS Performed at [facility], [address] [place], [place] [address]    Report Status PENDING  Incomplete  Culture, Urine     Status: Abnormal   Collection Time: 05/30/20  5:41 AM   Specimen: Urine, Catheterized  Result Value Ref Range Status   Specimen Description URINE, CATHETERIZED  Final   Special  Requests NONE  Final   Culture (A)  Final    <10,000 COLONIES/mL INSIGNIFICANT GROWTH Performed at [facility], [address] [place], [place] [address]    Report Status 05/31/2020 FINAL  Final      Studies:  No results found.  Assessment: 35 y.o. 35 y.[place]. [place] female with  1) sickle cell disease  2) anemia  3) pulmonary hypertension  4) CKD stage IIIb  5) diabetes mellitus type 2  6) fever    Plan:  Pamela Hudson's BP is soft and her heart rate is up; she has been on diuretics given her edema at presentation and likely she is dehydrated. Hopefully some fluid will correct this. I agree transfer to ICU is warranted--nurses on this floor have been in the room every time I have come to see patient and this has to create problem with their care of their other patients.  From a hematology point of view, this is a patient with Hb SS who is not in crisis. Her LDH is minimally elevated indicating hemolysis is at baseline. She received one unti PRBCs yesterday bringing her Hb up to 7.7 which is adequate-- her baseline is 8.0.  As you are aware, we prefer to not transfuse SCD patients unless there are strong indications because of concerns with sensitization and iron overload. However if the patient needed  further transfusion she is not a difficult match. Pamela Hudson's ferritin is >7500 and her saturation 98%. She will need chelation at some point--this can be done as outpatient through her renal MD.  Also she has stage 3 CKD. This means she likely does not make sufficient epo to keep her Hb at baseline (7/5 - 8.5) when under stress. She received aranesp yesterday and this also can be continued on an outpatient basis.  I will follow with you during this hospitalization.  Please let me know if I can be of further help at this point.   Lowella Dell, MD 06/01/2020  9:01 AM Medical Oncology and Hematology [facility] [address]  [place], [place] [address] Tel. [phone]    Fax. [phone]

## 2020-06-01 NOTE — Progress Notes (Signed)
eLink Physician-Brief Progress Note Patient Name: Pamela Hudson DOB: [birth year] MRN: [id number]   Date of Service  06/01/2020  HPI/Events of Note  Patient c/o gout flare and sickle cell pain.   eICU Interventions  Plan: 1. Colchicine 0.6 mg PO X 1 now.  2. Fentanyl 25 mcg IV X 1 now.      Intervention Category Major Interventions: Other:  Lenell Antu 06/01/2020, 10:07 PM

## 2020-06-01 NOTE — Progress Notes (Signed)
  Patient developed SVT with HR 190 on floor. Seen by Dr. Diona Browner. Milrinone stopped. Failed vagal maneuvers. Given adenosine 6,12,12 with apparent conversion to SR but immediate return to SVT. Given IVF and amio bolus 150 and moved to unit emergently.  On arrival to unit patient feeling weak. Remained in SVT with rates 190 despite initial amio bolus. On amio at 1. SBP 70s. Mentation and respiratory sats stable on exam.    Given bolus of neo and started on drip. Additional 250 NS given. Additional 300 IV amio given. Repeated adenosine 6 with what appeared to be possible atrial tach with 2:1 conduction and then rate increased again. Soon after patient broke with IV amio to NSR in 90s. ECG obtained.   Patient feeling much better. Will start to wean Neo. D/w Dr. Diona Browner who was also at bedside in ICU.   Critical care time 45 mins.   Arvilla Meres, MD  12:10 PM

## 2020-06-01 NOTE — Significant Event (Signed)
Rapid Response Event Note   Reason for Call :  Tachycardia  Initial Focused Assessment:  Received a call from the RN stating that her patient has gone into a sinus tachycardia.  The patient denies chest pain, dizziness, and SOB.  The patient just had a complaint that she doesn't feel good.  She states that this has happened to her before in the ED setting.    BP 80/60 ECG 198 O2 98 (2L) RR 20 Temp 97.8 F   Interventions:  Vagal maneuvers, MD performed carotid massage, amiodarone bolus (150 and 300 mg) and gtt, zoll monitoring, fluid bolus 250 mL x 3, adenosine (6 mg, 12 mg, 12 mg, and 6 mg) given, and patient was transferred to CVICU without complications  Plan of Care:  Continue cardiac monitoring on patient Monitor BP and titrate Neo   Event Summary:   MD Notified: Andre Lefort MD bedside Call Time: 0831 Arrival Time: 8677 End Time: 1030  Andrey Spearman, RN

## 2020-06-01 NOTE — Progress Notes (Signed)
Progress Note  Patient Name: Pamela Hudson Date of Encounter: 06/01/2020  Consulting Cardiologist: Riley Lam, MD  Subjective   Feels weak and short of breath this morning in setting of SVT.  Inpatient Medications    Scheduled Meds: . adenosine      . fentaNYL      . midazolam      . Chlorhexidine Gluconate Cloth  6 each Topical Daily  . enoxaparin (LOVENOX) injection  30 mg Subcutaneous Q24H  . feeding supplement  237 mL Oral BID BM  . hydroxyurea  1,000 mg Oral Daily  . insulin aspart  0-9 Units Subcutaneous Q4H  . insulin glargine  25 Units Subcutaneous QHS  . multivitamin with minerals  1 tablet Oral Daily  . sodium chloride flush  10-40 mL Intracatheter Q12H  . torsemide  40 mg Oral Daily   Continuous Infusions: . milrinone Stopped (06/01/20 0819)   PRN Meds: acetaminophen, guaiFENesin-dextromethorphan, heparin lock flush, heparin lock flush, ondansetron (ZOFRAN) IV, ondansetron (ZOFRAN) IV, sodium chloride flush, sodium chloride flush   Vital Signs    Vitals:   05/31/20 1959 06/01/20 0351 06/01/20 0705 06/01/20 0900  BP: 109/74 100/62  (!) 80/60  Pulse: (!) 108 (!) 106  (!) 195  Resp: 18 20    Temp: 98.4 F (36.9 C) 97.8 F (36.6 C)    TempSrc: Oral Oral    SpO2: 100% 97%    Weight:   64.8 kg   Height:        Intake/Output Summary (Last 24 hours) at 06/01/2020 1001 Last data filed at 06/01/2020 0727 Gross per 24 hour  Intake 1371.76 ml  Output 1770 ml  Net -398.24 ml   Filed Weights   05/31/20 0002 05/31/20 0444 06/01/20 0705  Weight: 64.2 kg 64.2 kg 64.8 kg    Telemetry    Onset persistent SVT in the 170s to 190s this morning, transition noted from sinus rhythm.  Personally reviewed.  ECG    I personally reviewed her ECG today consistent narrow complex tachycardia, SVT.  Physical Exam   GEN: No acute distress.   Neck: JVD. Cardiac:  Tachycardic. Respiratory:  Clear to auscultation and. GI:  Protuberant, bowel sounds  present. MS: No edema; No deformity. Neuro:  Nonfocal. Psych: Alert and oriented x 3. Normal affect.  Labs    Chemistry Recent Labs  Lab 05/29/20 1129 05/29/20 1925 05/30/20 0541 05/30/20 1717 05/31/20 0351 05/31/20 0835 06/01/20 0500  NA 130*   < > 130* 130*  --  130*  --   K 5.2*   < > 5.6* 5.5*  --  4.7  --   CL 105  --  104 104  --  104  --   CO2 18*  --  18* 17*  --  18*  --   GLUCOSE 168*  --  140* 275*  --  224*  --   BUN 60*  --  69* 71*  --  67*  --   CREATININE 2.44*   < > 2.53* 2.41*  --  2.14*  --   CALCIUM 8.0*  --  8.2* 8.1*  --  8.2*  --   PROT 8.8*  --   --   --  8.4*  --  8.6*  ALBUMIN 2.7*  --   --   --  2.5*  --  2.5*  AST 96*  --   --   --  69*  --  59*  ALT 82*  --   --   --  67*  --  37  ALKPHOS 106  --   --   --  102  --  105  BILITOT 6.4*  --   --   --  5.9*  --  5.0*  GFRNONAA 26*   < > 25* 27*  --  31*  --   ANIONGAP 7  --  8 9  --  8  --    < > = values in this interval not displayed.     Hematology Recent Labs  Lab 05/31/20 0351 05/31/20 2130 06/01/20 0500  WBC 15.6* 14.6* 14.2*  RBC 1.37* 1.82* 1.74*  HGB 6.1* 7.7* 7.4*  HCT 16.5* 20.6* 19.6*  MCV 120.4* 113.2* 112.6*  MCH 44.5* 42.3* 42.5*  MCHC 37.0* 37.4* 37.8*  RDW 17.1* 23.2* 23.1*  PLT 179 209 201    Cardiac Enzymes Recent Labs  Lab 05/27/20 1604 05/27/20 1840  TROPONINIHS 34* 34*    BNP Recent Labs  Lab 05/27/20 1733 05/29/20 0615  BNP 1,228.5* 3,195.8*     Radiology    No results found.  Cardiac Studies   Echocardiogram 05/28/2020:  1. 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 The leaflets are  fixed and appear immobile. Leaflet immobility is out of proportion to  annular dilation. Would consider primary leaflet pathology such as  carcinoid involvement in the work-up in  addition to the patient's long history of severe pulmonary hypertension.  The tricuspid valve is abnormal. Tricuspid valve regurgitation is severe.  2. RVSP is severely  underestimated in the setting of severe tricuspid  regurgitation. Right ventricular systolic function is severely reduced.  The right ventricular size is severely enlarged. There is normal pulmonary  artery systolic pressure. The  estimated right ventricular systolic pressure is 34.5 mmHg.  3. Left ventricular ejection fraction, by estimation, is 45 to 50%. Left  ventricular ejection fraction by 3D volume is 45 %. The left ventricle has  mildly decreased function. The left ventricle demonstrates global  hypokinesis. Indeterminate diastolic  filling due to E-A fusion. There is the interventricular septum is  flattened in systole and diastole, consistent with right ventricular  pressure and volume overload.  4. Right atrial size was severely dilated.  5. A small pericardial effusion is present. The pericardial effusion is  circumferential.  6. The mitral valve is grossly normal. Mild to moderate mitral valve  regurgitation. No evidence of mitral stenosis.  7. The aortic valve is tricuspid. Aortic valve regurgitation is not  visualized. No aortic stenosis is present.  8. The inferior vena cava is dilated in size with <50% respiratory  variability, suggesting right atrial pressure of 15 mmHg.   Comparison(s): Changes from prior study are noted. RV is severely dilated  with severely reduced function. Severe torrential TR is present. LVEF is  mildly reduced 45-50%. RVSP underestimated in setting of severe TR.   Patient Profile     35 y.o. 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  Complicated by runs of SVT, hepatic congestion, DM, and CKD Stage IIIB.  Assessment & Plan    1.  Persistent SVT with onset this morning.  I had milrinone discontinued and ECG obtained, assessed patient at the bedside.  She was initially treated with adenosine beginning at 6 mg, repeated at 12 mg, each time with conversion to sinus rhythm but rapid return to  SVT.  This was followed by initiation of IV amiodarone, 150 mg bolus and fusion.  Additional adenosine  12 mg was given, again with conversion to sinus rhythm with rapid return to SVT.  Systolic blood pressure in the 80s to 90s resulting in fluid bolus.  Patient transferred to the ICU.  Subsequently received additional amiodarone bolus, was started on Neo-Synephrine to support blood pressure.  Adenosine 6 mg dose given resulting in conversion to sinus rhythm and then return to SVT.  Eventually however, she converted spontaneously back to sinus tachycardia, likely combination of amiodarone and time.  Follow-up ECG shows sinus rhythm.  1.  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  Right heart catheterization demonstrates only mild pulmonary hypertension at this point.  She does have severe RV failure however.  Amiodarone has been discontinued above.  2.  Acute on chronic kidney disease, CKD stage IIIb at baseline.  Follow-up BMET pending.  3.  Severe tricuspid regurgitation.  Tricuspid valve is significantly restricted suggesting primary leaflet pathology in addition to annular dilatation.  4.  Acute on chronic combined heart failure with mild reduction in LVEF in the range of 45 to 50% by most recent assessment.  Approximately 1000 cc net output over the last 24 hours.  5.  Staph capitis bacteremia.  Patient afebrile, currently on vancomycin.  Reviewed case with Dr. Gala Romney who assisted with her care in ICU.  Continue IV amiodarone, plan to wean off Neo-Synephrine.  Follow-up BMET to reassess electrolytes and renal function.  Keep off milrinone.  Would hold Demadex for today.  Signed, Nona Dell, MD  06/01/2020, 10:01 AM

## 2020-06-01 NOTE — Progress Notes (Signed)
At approximately 0730, CCMD called and alerted RN to check on the patient in room 4. RN checked patient, she was tachycardic sustaining HR in 170's. No chest pain or SOB. MD paged. Rapid Response called. ECG performed. MDs came to bedside. New orders and meds given. Patient continued in SVT and was transferred to [facility].

## 2020-06-01 NOTE — Progress Notes (Signed)
PROGRESS NOTE    Pamela Hudson  ZOX:[id number] DOB: 1985/08/08 DOA: 05/27/2020 PCP: Judd Lien, PA-C    Brief Narrative:  Pamela Hudson is a 35 y.o. female with medical history significant of cardiomegaly, CHF, chronic pain, CVA, HTN, PCOS, sickle cell anemia, DM2, chronic right heart strain, pulmonary Hypertension, CKD stage 3, HTN, transient A.fib COVID 2020    Presented with   Gradual leg swelling patient with CHF, chronic pain.  Patient was concerned that she was volume overloaded.  She has been having also worsening shortness of breath for the past few days to weeks.  States she has been taking her medication Reports up to 5 pound weight gain over the past 3 days  3/17- doe when walking the stairs. Feels close to baseline? Plan for [facility] cath today 3/18-s/p RHC on 3/17. Plan to start milrinone 3/19-Hg 6.1.  T-max 101.2.  3/20-Rapid response called as pt's HR was 170's increasing to 200's. EKG with SVT.Vasovagal maneuvers applied,  Was given  adenosine, then  adenosine, then started on amiodarone gtt , given another round of adenosine12mg  with persistent SVT. SBP 70's, given bolus x2, pt transferred to ICU. Pt felt palpitations and reports having "episodes" in the past. Feels weak and sob. Cards by bedside too.     Consultants:   Cardiology hematology  Procedures:   Antimicrobials:       Subjective: As above, in SVT   Objective: Vitals:   05/31/20 1900 05/31/20 1959 06/01/20 0351 06/01/20 0705  BP: 111/78 109/74 100/62   Pulse: 82 (!) 108 (!) 106   Resp: Temp: 98.4 F (36.9 C) 98.4 F (36.9 C) 97.8 F (36.6 C)   TempSrc: Oral Oral Oral   SpO2: 97% 100% 97%   Weight:    64.8 kg  Height:        Intake/Output Summary (Last 24 hours) at 06/01/2020 0727 Last data filed at 06/01/2020 0600 Gross per 24 hour  Intake 1371.76 ml  Output 2370 ml  Net -998.24 ml   Filed Weights   05/31/20 0002 05/31/20 0444 06/01/20 0705  Weight: 64.2 kg  64.2 kg 64.8 kg    Examination: Lying in bed, NAD Decreased breath sounds at bases no wheezing Tachycardic S1-S2 regular no gallops Mildly distended abdomen, positive bowel sounds nontender Trace pedal edema bilaterally . No erythema alert oriented x3 grossly intact     Data Reviewed: I have personally reviewed following labs and imaging studies  CBC: Recent Labs  Lab 05/27/20 1604 05/28/20 0221 05/29/20 1925 05/29/20 2200 05/30/20 0541 05/31/20 0351 05/31/20 2130 06/01/20 0500  WBC 12.8* 12.5*  --  13.8* 16.1* 15.6* 14.6* 14.2*  NEUTROABS 7.9* 7.5  --   --   --  10.5*  --  9.1*  HGB 7.8* 7.4*   < > 6.8* 6.8* 6.1* 7.7* 7.4*  HCT 20.9* 19.9*   < > 18.6* 18.6* 16.5* 20.6* 19.6*  MCV 122.2* 122.8*  --  122.4* 121.6* 120.4* 113.2* 112.6*  PLT 222 194  --  183 184 179 209 201   < > = values in this interval not displayed.   Basic Metabolic Panel: Recent Labs  Lab 05/28/20 0221 05/29/20 0615 05/29/20 1129 05/29/20 1925 05/29/20 1935 05/29/20 2200 05/30/20 0541 05/30/20 1717 05/31/20 0835  NA 132* 130* 130* 137  138 141  --  130* 130* 130*  K 4.9 5.4* 5.2* 5.3*  5.3* 4.8  --  5.6* 5.5* 4.7  CL 107 104 105  --   --   --  104 104 104  CO2 17* 18* 18*  --   --   --  18* 17* 18*  GLUCOSE 170* 139* 168*  --   --   --  140* 275* 224*  BUN 50* 59* 60*  --   --   --  69* 71* 67*  CREATININE 1.63* 2.41* 2.44*  --   --  2.49* 2.53* 2.41* 2.14*  CALCIUM 8.5* 8.0* 8.0*  --   --   --  8.2* 8.1* 8.2*  MG 2.4  --   --   --   --   --   --   --   --   PHOS 4.5  --   --   --   --   --   --   --   --    GFR: Estimated Creatinine Clearance: 30.9 mL/min (A) (by C-G formula based on SCr of 2.14 mg/dL (H)). Liver Function Tests: Recent Labs  Lab 05/27/20 1700 05/28/20 0221 05/29/20 1129 05/31/20 0351 06/01/20 0500  AST 98* 89* 96* 69* 59*  ALT 88* 85* 82* 67* 37  ALKPHOS 118 106 106 102 105  BILITOT 5.0* 5.6* 6.4* 5.9* 5.0*  PROT 9.4* 9.5* 8.8* 8.4* 8.6*  ALBUMIN 3.2*  2.8* 2.7* 2.5* 2.5*   Recent Labs  Lab 05/27/20 1700  LIPASE 30   No results for input(s): AMMONIA in the last 168 hours. Coagulation Profile: Recent Labs  Lab 05/28/20 0221  INR 1.4*   Cardiac Enzymes: No results for input(s): CKTOTAL, CKMB, CKMBINDEX, TROPONINI in the last 168 hours. BNP (last 3 results) No results for input(s): PROBNP in the last 8760 hours. HbA1C: No results for input(s): HGBA1C in the last 72 hours. CBG: Recent Labs  Lab 05/31/20 1138 05/31/20 1625 05/31/20 2001 05/31/20 2353 06/01/20 0349  GLUCAP 190* 307* 292* 164* 93   Lipid Profile: No results for input(s): CHOL, HDL, LDLCALC, TRIG, CHOLHDL, LDLDIRECT in the last 72 hours. Thyroid Function Tests: No results for input(s): TSH, T4TOTAL, FREET4, T3FREE, THYROIDAB in the last 72 hours. Anemia Panel: Recent Labs    05/30/20 1615  RETICCTPCT 7.6*   Sepsis Labs: No results for input(s): PROCALCITON, LATICACIDVEN in the last 168 hours.  Recent Results (from the past 240 hour(s))  Resp Panel by RT-PCR (Flu A&B, Covid) Nasopharyngeal Swab     Status: None   Collection Time: 05/27/20  4:26 PM   Specimen: Nasopharyngeal Swab; Nasopharyngeal(NP) swabs in vial transport medium  Result Value Ref Range Status   SARS Coronavirus 2 by RT PCR NEGATIVE NEGATIVE Final    Comment: (NOTE) SARS-CoV-2 target nucleic acids are NOT DETECTED.  The SARS-CoV-2 RNA is generally detectable in upper respiratory specimens during the acute phase of infection. The lowest concentration of SARS-CoV-2 viral copies this assay can detect is 138 copies/mL. A negative result does not preclude SARS-Cov-2 infection and should not be used as the sole basis for treatment or other patient management decisions. A negative result may occur with  improper specimen collection/handling, submission of specimen other than nasopharyngeal swab, presence of viral mutation(s) within the areas targeted by this assay, and inadequate number  of viral copies(<138 copies/mL). A negative result must be combined with clinical observations, patient history, and epidemiological information. The expected result is Negative.  Fact Sheet for Patients:  BloggerCourse.com  Fact Sheet for Healthcare Providers:  SeriousBroker.it  This test is no t yet approved or cleared by the [place] FDA and  has been authorized for detection and/or  diagnosis of SARS-CoV-2 by FDA under an Emergency Use Authorization (EUA). This EUA will remain  in effect (meaning this test can be used) for the duration of the COVID-19 declaration under Section 564(b)(1) of the Act, 21 U.S.C.section 360bbb-3(b)(1), unless the authorization is terminated  or revoked sooner.       Influenza A by PCR NEGATIVE NEGATIVE Final   Influenza B by PCR NEGATIVE NEGATIVE Final    Comment: (NOTE) The Xpert Xpress SARS-CoV-2/FLU/RSV plus assay is intended as an aid in the diagnosis of influenza from Nasopharyngeal swab specimens and should not be used as a sole basis for treatment. Nasal washings and aspirates are unacceptable for Xpert Xpress SARS-CoV-2/FLU/RSV testing.  Fact Sheet for Patients: BloggerCourse.comhttps://www.fda.gov/media/152166/download  Fact Sheet for Healthcare Providers: SeriousBroker.ithttps://www.fda.gov/media/152162/download  This test is not yet approved or cleared by the [place] FDA and has been authorized for detection and/or diagnosis of SARS-CoV-2 by FDA under an Emergency Use Authorization (EUA). This EUA will remain in effect (meaning this test can be used) for the duration of the COVID-19 declaration under Section 564(b)(1) of the Act, 21 U.S.C. section 360bbb-3(b)(1), unless the authorization is terminated or revoked.  Performed at [facility] [place], [address] [place], [place] [address]   Culture, blood (routine x 2)     Status: Abnormal (Preliminary result)   Collection Time: 05/28/20 10:45  PM   Specimen: BLOOD RIGHT HAND  Result Value Ref Range Status   Specimen Description BLOOD RIGHT HAND  Final   Special Requests   Final    BOTTLES DRAWN AEROBIC ONLY Blood Culture adequate volume   Culture  Setup Time   Final    GRAM POSITIVE COCCI IN CLUSTERS AEROBIC BOTTLE ONLY CRITICAL VALUE NOTED.  VALUE IS CONSISTENT WITH PREVIOUSLY REPORTED AND CALLED VALUE. Performed at [facility], [address] [place], [place] [address]    Culture STAPHYLOCOCCUS CAPITIS (A)  Final   Report Status PENDING  Incomplete  Culture, blood (routine x 2)     Status: Abnormal (Preliminary result)   Collection Time: 05/28/20 10:45 PM   Specimen: BLOOD LEFT HAND  Result Value Ref Range Status   Specimen Description BLOOD LEFT HAND  Final   Special Requests   Final    BOTTLES DRAWN AEROBIC ONLY Blood Culture results may not be optimal due to an inadequate volume of blood received in culture bottles   Culture  Setup Time   Final    GRAM POSITIVE COCCI IN CLUSTERS AEROBIC BOTTLE ONLY Organism ID to follow CRITICAL RESULT CALLED TO, READ BACK BY AND VERIFIED WITH: C. AMEND PHARMD, AT 0550 05/30/20 BY D VANHOOK    Culture (A)  Final    STAPHYLOCOCCUS CAPITIS SUSCEPTIBILITIES TO FOLLOW Performed at [facility], [address] [place], [place] [address]    Report Status PENDING  Incomplete  Blood Culture ID Panel (Reflexed)     Status: Abnormal   Collection Time: 05/28/20 10:45 PM  Result Value Ref Range Status   Enterococcus faecalis NOT DETECTED NOT DETECTED Final   Enterococcus Faecium NOT DETECTED NOT DETECTED Final   Listeria monocytogenes NOT DETECTED NOT DETECTED Final   Staphylococcus species DETECTED (A) NOT DETECTED Final    Comment: CRITICAL RESULT CALLED TO, READ BACK BY AND VERIFIED WITH: C. AMEND PHARMD, AT 0550 05/30/20 BY D VANHOOK    Staphylococcus aureus (BCID) NOT DETECTED NOT DETECTED Final   Staphylococcus epidermidis NOT DETECTED NOT DETECTED Final    Staphylococcus lugdunensis  NOT DETECTED NOT DETECTED Final   Streptococcus species NOT DETECTED NOT DETECTED Final   Streptococcus agalactiae NOT DETECTED NOT DETECTED Final   Streptococcus pneumoniae NOT DETECTED NOT DETECTED Final   Streptococcus pyogenes NOT DETECTED NOT DETECTED Final   A.calcoaceticus-baumannii NOT DETECTED NOT DETECTED Final   Bacteroides fragilis NOT DETECTED NOT DETECTED Final   Enterobacterales NOT DETECTED NOT DETECTED Final   Enterobacter cloacae complex NOT DETECTED NOT DETECTED Final   Escherichia coli NOT DETECTED NOT DETECTED Final   Klebsiella aerogenes NOT DETECTED NOT DETECTED Final   Klebsiella oxytoca NOT DETECTED NOT DETECTED Final   Klebsiella pneumoniae NOT DETECTED NOT DETECTED Final   Proteus species NOT DETECTED NOT DETECTED Final   Salmonella species NOT DETECTED NOT DETECTED Final   Serratia marcescens NOT DETECTED NOT DETECTED Final   Haemophilus influenzae NOT DETECTED NOT DETECTED Final   Neisseria meningitidis NOT DETECTED NOT DETECTED Final   Pseudomonas aeruginosa NOT DETECTED NOT DETECTED Final   Stenotrophomonas maltophilia NOT DETECTED NOT DETECTED Final   Candida albicans NOT DETECTED NOT DETECTED Final   Candida auris NOT DETECTED NOT DETECTED Final   Candida glabrata NOT DETECTED NOT DETECTED Final   Candida krusei NOT DETECTED NOT DETECTED Final   Candida parapsilosis NOT DETECTED NOT DETECTED Final   Candida tropicalis NOT DETECTED NOT DETECTED Final   Cryptococcus neoformans/gattii NOT DETECTED NOT DETECTED Final    Comment: Performed at [facility], [address], [place], [place] [address]  Culture, blood (routine x 2)     Status: None (Preliminary result)   Collection Time: 05/29/20  2:38 PM   Specimen: BLOOD LEFT HAND  Result Value Ref Range Status   Specimen Description BLOOD LEFT HAND  Final   Special Requests   Final    BOTTLES DRAWN AEROBIC AND ANAEROBIC Blood Culture results may not be optimal due to an  inadequate volume of blood received in culture bottles   Culture   Final    NO GROWTH 2 DAYS Performed at [facility], [address] [place], [place] [address]    Report Status PENDING  Incomplete  Culture, blood (routine x 2)     Status: None (Preliminary result)   Collection Time: 05/29/20  2:46 PM   Specimen: BLOOD RIGHT HAND  Result Value Ref Range Status   Specimen Description BLOOD RIGHT HAND  Final   Special Requests   Final    BOTTLES DRAWN AEROBIC ONLY Blood Culture results may not be optimal due to an inadequate volume of blood received in culture bottles   Culture   Final    NO GROWTH 2 DAYS Performed at [facility], [address], [place], [place] [address]    Report Status PENDING  Incomplete  Culture, Urine     Status: Abnormal   Collection Time: 05/30/20  5:41 AM   Specimen: Urine, Catheterized  Result Value Ref Range Status   Specimen Description URINE, CATHETERIZED  Final   Special Requests NONE  Final   Culture (A)  Final    <10,000 COLONIES/mL INSIGNIFICANT GROWTH Performed at [facility], [address], [place], [place] [address]    Report Status 05/31/2020 FINAL  Final         Radiology Studies: No results found.      Scheduled Meds: . Chlorhexidine Gluconate Cloth  6 each Topical Daily  . enoxaparin (LOVENOX) injection  30 mg Subcutaneous Q24H  . feeding supplement  237 mL Oral  BID BM  . hydroxyurea  1,000 mg Oral Daily  . insulin aspart  0-9 Units Subcutaneous Q4H  . insulin glargine  25 Units Subcutaneous QHS  . multivitamin with minerals  1 tablet Oral Daily  . sodium chloride flush  10-40 mL Intracatheter Q12H  . torsemide  40 mg Oral Daily   Continuous Infusions: . milrinone 0.125 mcg/kg/min (05/31/20 2003)    Assessment & Plan:   Active Problems:   Sickle cell anemia (HCC)   Elevated bilirubin   Leukocytosis   Dyspnea   Acute on chronic right-sided heart failure (HCC)   Chronic right-sided heart  failure (HCC)   Pulmonary hypertension (HCC)   CKD (chronic kidney disease)   DM2 (diabetes mellitus, type 2) (HCC)   Bacteremia   35 y.o. female with medical history significant of cardiomegaly, CHF, chronic pain, CVA, HTN, PCOS, sickle cell anemia, DM2, chronic right heart strain, pulmonary Hypertension, CKD stage 3, HTN, transient A.fib Admitted for acute on chronic Right heart failure.  Present on Admission: Acute biventricular HF Echo 45 to 50%.  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 Very mild PAH, 2. Severe RV failure however cardiac output preserved, 3. No evidence of intracardiac shunt  3/20- went into sustained SVT HR @200 . Milrinone stopped. Hold Demadex as she is hypotensive On amiodarone gtt now , transferred to ICU. Cardiology following, rec. Continuing amiodarone gtt. May need TEE for evaluation of TR/endocarditis.     . Sickle cell anemia (HCC)  S/p 1 unit transfusion per hematology orders on 3/19  From hematology standpoint patient is not in crisis from [facility]. Her LDH is minimally elevated indicating hemolysis is at baseline Hemoglobin is adequate Continue hydroxyurea  Hematology following    Sepsis-  Staph Capitis bacteremia- Sepsis based on tachycardia, leukocytosis, +bcx Has chronic port Blood cultures with staph capitis x2 sets from 3/16 3/20 IDs input was appreciated. Continue vacomysin Likely will need TEE this admission.  If no hemolysis/sickle cell concern and TEE negative, ID recommends likely to treat patient with 2 weeks of linezolid and vancomycin [place] and see how she does versus option of 7-day antibiotic with surveillance cultures alternative.   Persistent SVT-this am. Unable to break arrhythmia with vasovagal, adenosine multiple pushes. Started on amiodarone gtt Transferred to icu.  Eventually converted to sinus tach.  Follow-up ECG sinus rhythm. We will follow up electrolytes Hold Demadex Hold  milrinone Continue IV amiodarone Cardiology following    Pulmonary hypertension [facility]) - Cardiology following WHO functional class III and V in settin gof SS and OSA 3/18-s/p RHC with results above. on right heart cath revealed only mild pulmonary hypertension at this time, she does have severe RV failure and management as above  No history of DVT or PE to suggest chronic thromboembolic disease: Patient improves will need V/Q scan as inpatient  Fluid restriction less than 2 L      Hyperkalemia- Resolved with kayexalate     . CKD (chronic kidney disease) stage 3b Mildly up, cr 2.53>>>2.14 improving Possibly due to hypoperfusion from CHF 3/19-continue milrinone and diuretics  3/20-milrinone and diuretics on hold. Avoid nephrotoxic meds Monitor renal function     GOUT- LLE ankle Given a dose of colchicine  . Elevated bilirubin  - chronic abnormal LFTs history of febrile hepatomegaly in the past Abdominal ultrasound with increased liver echotexture compatible with hepatic steatosis 3/19-may also be affected from her BIV failure. 3/20-LFT improving continue to monitor.  Dm2-  -  BG mildly elevated at  times  Sensitive  SSI  A1c 5.2 TSH 5.9129 and FT4 1.27, will need reck once acute medical issues stablized and reck levels in 4 weeks    DVT prophylaxis: Lovenox Code Status: Full Family Communication: None at bedside  Status is: Inpatient  Remains inpatient appropriate because:IV treatments appropriate due to intensity of illness or inability to take PO   Dispo: The patient is from: Home              Anticipated d/c is to: Home              Patient currently is not medically stable to d/c.   Difficult to place patient No            LOS: 5 days   Time spent: 90 minutes with more than 50% on COC on critical care time during rapid response      Lynn Ito, MD [facility] Pager [phone]  If 7PM-7AM, please contact  night-coverage 06/01/2020, 7:27 AM

## 2020-06-02 ENCOUNTER — Inpatient Hospital Stay (HOSPITAL_COMMUNITY): Payer: Medicaid Other

## 2020-06-02 DIAGNOSIS — R7881 Bacteremia: Secondary | ICD-10-CM | POA: Diagnosis not present

## 2020-06-02 DIAGNOSIS — I50812 Chronic right heart failure: Secondary | ICD-10-CM | POA: Diagnosis not present

## 2020-06-02 DIAGNOSIS — N183 Chronic kidney disease, stage 3 unspecified: Secondary | ICD-10-CM | POA: Diagnosis not present

## 2020-06-02 DIAGNOSIS — I50813 Acute on chronic right heart failure: Secondary | ICD-10-CM | POA: Diagnosis not present

## 2020-06-02 DIAGNOSIS — I272 Pulmonary hypertension, unspecified: Secondary | ICD-10-CM

## 2020-06-02 DIAGNOSIS — B957 Other staphylococcus as the cause of diseases classified elsewhere: Secondary | ICD-10-CM | POA: Diagnosis not present

## 2020-06-02 DIAGNOSIS — D72829 Elevated white blood cell count, unspecified: Secondary | ICD-10-CM

## 2020-06-02 DIAGNOSIS — R509 Fever, unspecified: Secondary | ICD-10-CM

## 2020-06-02 LAB — COMPREHENSIVE METABOLIC PANEL
ALT: 34 U/L (ref 0–44)
AST: 68 U/L — ABNORMAL HIGH (ref 15–41)
Albumin: 2.5 g/dL — ABNORMAL LOW (ref 3.5–5.0)
Alkaline Phosphatase: 127 U/L — ABNORMAL HIGH (ref 38–126)
Anion gap: 7 (ref 5–15)
BUN: 65 mg/dL — ABNORMAL HIGH (ref 6–20)
CO2: 18 mmol/L — ABNORMAL LOW (ref 22–32)
Calcium: 8.3 mg/dL — ABNORMAL LOW (ref 8.9–10.3)
Chloride: 103 mmol/L (ref 98–111)
Creatinine, Ser: 1.78 mg/dL — ABNORMAL HIGH (ref 0.44–1.00)
GFR, Estimated: 38 mL/min — ABNORMAL LOW (ref >60)
Glucose, Bld: 148 mg/dL — ABNORMAL HIGH (ref 70–99)
Potassium: 4.7 mmol/L (ref 3.5–5.1)
Sodium: 128 mmol/L — ABNORMAL LOW (ref 135–145)
Total Bilirubin: 4.8 mg/dL — ABNORMAL HIGH (ref 0.3–1.2)
Total Protein: 8.7 g/dL — ABNORMAL HIGH (ref 6.5–8.1)

## 2020-06-02 LAB — CBC WITH DIFFERENTIAL/PLATELET
Abs Immature Granulocytes: 0.22 10*3/uL — ABNORMAL HIGH (ref 0.00–0.07)
Basophils Absolute: 0.1 10*3/uL (ref 0.0–0.1)
Basophils Relative: 1 %
Eosinophils Absolute: 0.1 10*3/uL (ref 0.0–0.5)
Eosinophils Relative: 1 %
HCT: 21.7 % — ABNORMAL LOW (ref 36.0–46.0)
Hemoglobin: 8 g/dL — ABNORMAL LOW (ref 12.0–15.0)
Immature Granulocytes: 1 %
Lymphocytes Relative: 19 %
Lymphs Abs: 2.9 10*3/uL (ref 0.7–4.0)
MCH: 42.6 pg — ABNORMAL HIGH (ref 26.0–34.0)
MCHC: 36.9 g/dL — ABNORMAL HIGH (ref 30.0–36.0)
MCV: 115.4 fL — ABNORMAL HIGH (ref 80.0–100.0)
Monocytes Absolute: 1.6 10*3/uL — ABNORMAL HIGH (ref 0.1–1.0)
Monocytes Relative: 10 %
Neutro Abs: 10.4 10*3/uL — ABNORMAL HIGH (ref 1.7–7.7)
Neutrophils Relative %: 68 %
Platelets: 225 10*3/uL (ref 150–400)
RBC: 1.88 MIL/uL — ABNORMAL LOW (ref 3.87–5.11)
RDW: 23.1 % — ABNORMAL HIGH (ref 11.5–15.5)
WBC: 15.4 10*3/uL — ABNORMAL HIGH (ref 4.0–10.5)
nRBC: 10.2 % — ABNORMAL HIGH (ref 0.0–0.2)

## 2020-06-02 LAB — LACTATE DEHYDROGENASE: LDH: 263 U/L — ABNORMAL HIGH (ref 98–192)

## 2020-06-02 LAB — RETICULOCYTES: RBC.: 1.9 MIL/uL — ABNORMAL LOW (ref 3.87–5.11)

## 2020-06-02 LAB — GLUCOSE, CAPILLARY
Glucose-Capillary: 146 mg/dL — ABNORMAL HIGH (ref 70–99)
Glucose-Capillary: 177 mg/dL — ABNORMAL HIGH (ref 70–99)
Glucose-Capillary: 197 mg/dL — ABNORMAL HIGH (ref 70–99)
Glucose-Capillary: 243 mg/dL — ABNORMAL HIGH (ref 70–99)
Glucose-Capillary: 293 mg/dL — ABNORMAL HIGH (ref 70–99)
Glucose-Capillary: 89 mg/dL (ref 70–99)

## 2020-06-02 LAB — HAPTOGLOBIN: Haptoglobin: <10 mg/dL — ABNORMAL LOW (ref 33–278)

## 2020-06-02 MED ORDER — PREDNISONE 20 MG PO TABS
40.0000 mg | ORAL_TABLET | Freq: Once | ORAL | Status: AC
  Administered 2020-06-02: 40 mg via ORAL
  Filled 2020-06-02: qty 2

## 2020-06-02 MED ORDER — CEFAZOLIN SODIUM-DEXTROSE 2-4 GM/100ML-% IV SOLN
2.0000 g | Freq: Three times a day (TID) | INTRAVENOUS | Status: DC
  Administered 2020-06-02 – 2020-06-11 (×27): 2 g via INTRAVENOUS
  Filled 2020-06-02 (×33): qty 100

## 2020-06-02 MED ORDER — PREDNISONE 10 MG PO TABS
10.0000 mg | ORAL_TABLET | Freq: Every day | ORAL | Status: AC
  Administered 2020-06-05 – 2020-06-06 (×2): 10 mg via ORAL
  Filled 2020-06-02 (×2): qty 1

## 2020-06-02 MED ORDER — PREDNISONE 20 MG PO TABS: 40.0000 mg | ORAL_TABLET | Freq: Every day | ORAL | Status: DC

## 2020-06-02 MED ORDER — HYDROMORPHONE HCL 2 MG PO TABS
2.0000 mg | ORAL_TABLET | ORAL | Status: DC | PRN
  Administered 2020-06-02 – 2020-06-07 (×4): 2 mg via ORAL
  Filled 2020-06-02 (×4): qty 1

## 2020-06-02 MED ORDER — INSULIN GLARGINE 100 UNIT/ML ~~LOC~~ SOLN
12.0000 [IU] | Freq: Every day | SUBCUTANEOUS | Status: DC
  Administered 2020-06-02 – 2020-06-03 (×2): 12 [IU] via SUBCUTANEOUS
  Filled 2020-06-02 (×3): qty 0.12

## 2020-06-02 MED ORDER — FENTANYL CITRATE (PF) 100 MCG/2ML IJ SOLN
25.0000 ug | Freq: Once | INTRAMUSCULAR | Status: AC
  Administered 2020-06-02: 25 ug via INTRAVENOUS
  Filled 2020-06-02: qty 2

## 2020-06-02 MED ORDER — FUROSEMIDE 10 MG/ML IJ SOLN
80.0000 mg | Freq: Once | INTRAMUSCULAR | Status: AC
  Administered 2020-06-02: 80 mg via INTRAVENOUS
  Filled 2020-06-02: qty 8

## 2020-06-02 MED ORDER — PREDNISONE 10 MG PO TABS
5.0000 mg | ORAL_TABLET | Freq: Every day | ORAL | Status: AC
  Administered 2020-06-07: 5 mg via ORAL
  Filled 2020-06-02: qty 1

## 2020-06-02 MED ORDER — PREDNISONE 20 MG PO TABS
20.0000 mg | ORAL_TABLET | Freq: Every day | ORAL | Status: AC
  Administered 2020-06-03 – 2020-06-04 (×2): 20 mg via ORAL
  Filled 2020-06-02 (×2): qty 1

## 2020-06-02 MED ORDER — AMIODARONE HCL 200 MG PO TABS
200.0000 mg | ORAL_TABLET | Freq: Two times a day (BID) | ORAL | Status: DC
  Administered 2020-06-02 – 2020-06-03 (×3): 200 mg via ORAL
  Filled 2020-06-02 (×3): qty 1

## 2020-06-02 MED ORDER — FUROSEMIDE 10 MG/ML IJ SOLN
INTRAMUSCULAR | Status: AC
  Filled 2020-06-02: qty 4

## 2020-06-02 NOTE — Progress Notes (Signed)
COURTESY NOTE:  35 y/o [place] woman with Hb SS disease complicated by pulmonary hypertension, R heart failure, chronic compensated hemolysis, iron overload, CKD stage III, received aranesp x1 05/31/2020 (but note epo level 109.7 on 05/31/2020), s/p transfusion 1 unit PRBCs 05/31/2020  Hb back to baseline at 8.0, LDH no trend (also at baseline), reticulocyte >100   No evidence of crisis; will follow peripherally; note that patient has well-established hematologic and renal follow-up through [facility] and will return to those specialists at discharge

## 2020-06-02 NOTE — Progress Notes (Signed)
[facility]  Date of Admission:  05/27/2020      Total days of antibiotics 4  Cefazolin 1  Vancomycin 2 days           ASSESSMENT: Pamela Hudson is a 35 y.o. female here with acute on chronic exacerbation of right sided heart failure / pulmonary artery hypertension.  She was also found to have leukocytosis, fever and staphylococcus capitus growing in 2/4 blood culture bottles from two sites c/w true bacteremia.  Will switch back to cefazolin now that susceptibilities are back.   Concerning that her port is infected. D/W Dr. Tonia Brooms at the bedside - would prefer to have port removed for curative approach --> can be re-implanted after 24-48 hours. Problem is that she will need to establish peripheral access prior to removal in IR. [name] is OK with this plan if we need to do that.  Alternatively if she is not able to get the port out we could look into systemic IV therapy + antibiotic lock dwell (if this is possible with her chest port) x 2 weeks with surveillance cultures after.   TEE would be helpful for anatomy r/t RV failure + TV assessment but also to r/o vegetations. Will also help contribute to antibiotic treatment duration.     PLAN: 1. Change to IV cefazolin  2. IV team to try to establish PIV if possible  3. If #2 achieved, can consult IR for port removal.     Active Problems:   Sickle cell anemia (HCC)   Elevated bilirubin   Leukocytosis   Dyspnea   Acute on chronic right-sided heart failure (HCC)   Chronic right-sided heart failure (HCC)   Pulmonary hypertension (HCC)   CKD (chronic kidney disease)   DM2 (diabetes mellitus, type 2) (HCC)   Bacteremia   SVT (supraventricular tachycardia) (HCC)   . amiodarone  200 mg Oral BID  . Chlorhexidine Gluconate Cloth  6 each Topical Daily  . enoxaparin (LOVENOX) injection  30 mg Subcutaneous Q24H  . feeding supplement  237 mL Oral BID BM  . hydroxyurea  1,000 mg Oral Daily  .  insulin aspart  0-9 Units Subcutaneous Q4H  . insulin glargine  25 Units Subcutaneous QHS  . multivitamin with minerals  1 tablet Oral Daily  . [START ON 06/03/2020] predniSONE  20 mg Oral Q breakfast   Followed by  . [START ON 06/05/2020] predniSONE  10 mg Oral Q breakfast  . [START ON 06/07/2020] predniSONE  5 mg Oral Q breakfast  . sodium chloride flush  10-40 mL Intracatheter Q12H    SUBJECTIVE: Doing better - specifically her lungs are improved and cough improved. Having what she thinks is a gout flare currently however - left foot and right wrist. Wrist is defintely swollen to her, has not seen her foot today. No wounds on feet - she says she is careful to check her feet with her h/o diabetes.   Reports that she has had her dual-lumen power port was placed in her right chest 5 years ago. Used to get high volumes of blood transfusions however now just uses it every 3 months for venous access to do labs. Last used was 3 months ago. Did have to arrange what sounds like tPA at that time due to no blood withdrawing from it. She states it is working appropriately at this time. Does have swelling in neck veins but attributed that more to fluid.  Review of Systems: Review of Systems  Constitutional: Negative for chills, fever, malaise/fatigue and weight loss.  HENT: Negative for sore throat.   Respiratory: Positive for cough. Negative for sputum production.   Cardiovascular: Negative for chest pain and leg swelling.  Gastrointestinal: Negative for abdominal pain, diarrhea and vomiting.  Genitourinary: Negative for dysuria and flank pain.  Musculoskeletal: Positive for joint pain (L foot, R wrist). Negative for myalgias and neck pain.  Skin: Negative for rash.  Neurological: Negative for dizziness, tingling and headaches.  Psychiatric/Behavioral: Negative for depression and substance abuse. The patient is not nervous/anxious and does not have insomnia.     Allergies  Allergen Reactions   . Oxycodone-Acetaminophen Swelling and Other (See Comments)    Lips swell  . Prednisone Other (See Comments)    Elevates BGL    OBJECTIVE: Vitals:   06/02/20 0700 06/02/20 0759 06/02/20 0800 06/02/20 1134  BP: 100/79  100/78   Pulse: 96 96    Resp: (!) 21 20 (!) 25   Temp:    98.3 F (36.8 C)  TempSrc:    Oral  SpO2: 97% 98%    Weight:      Height:       Body mass index is 29.61 kg/m.  Physical Exam Constitutional:      Comments: Resting quietly in bed.   HENT:     Mouth/Throat:     Mouth: No oral lesions.     Dentition: No dental abscesses.  Cardiovascular:     Rate and Rhythm: Normal rate and regular rhythm.     Heart sounds: Normal heart sounds.  Pulmonary:     Effort: Pulmonary effort is normal.     Breath sounds: Normal breath sounds.  Chest:       Comments: Chest port accessed - some ecchymosis noted to the medial boarder. No tenderness, warmth or fluctuance Abdominal:     General: There is no distension.     Palpations: Abdomen is soft.     Tenderness: There is no abdominal tenderness.  Musculoskeletal:        General: No tenderness. Normal range of motion.     Cervical back: No tenderness.  Lymphadenopathy:     Cervical: No cervical adenopathy.  Skin:    General: Skin is warm and dry.     Capillary Refill: Capillary refill takes less than 2 seconds.     Findings: No rash.  Neurological:     Mental Status: She is alert and oriented to person, place, and time.  Psychiatric:        Judgment: Judgment normal.     Lab Results Lab Results  Component Value Date   WBC 15.4 (H) 06/02/2020   HGB 8.0 (L) 06/02/2020   HCT 21.7 (L) 06/02/2020   MCV 115.4 (H) 06/02/2020   PLT 225 06/02/2020    Lab Results  Component Value Date   CREATININE 1.78 (H) 06/02/2020   BUN 65 (H) 06/02/2020   NA 128 (L) 06/02/2020   K 4.7 06/02/2020   CL 103 06/02/2020   CO2 18 (L) 06/02/2020    Lab Results  Component Value Date   ALT 34 06/02/2020   AST 68 (H)  06/02/2020   ALKPHOS 127 (H) 06/02/2020   BILITOT 4.8 (H) 06/02/2020     Microbiology: Recent Results (from the past 240 hour(s))  Resp Panel by RT-PCR (Flu A&B, Covid) Nasopharyngeal Swab     Status: None   Collection Time: 05/27/20  4:26 PM   Specimen:  Nasopharyngeal Swab; Nasopharyngeal(NP) swabs in vial transport medium  Result Value Ref Range Status   SARS Coronavirus 2 by RT PCR NEGATIVE NEGATIVE Final    Comment: (NOTE) SARS-CoV-2 target nucleic acids are NOT DETECTED.  The SARS-CoV-2 RNA is generally detectable in upper respiratory specimens during the acute phase of infection. The lowest concentration of SARS-CoV-2 viral copies this assay can detect is 138 copies/mL. A negative result does not preclude SARS-Cov-2 infection and should not be used as the sole basis for treatment or other patient management decisions. A negative result may occur with  improper specimen collection/handling, submission of specimen other than nasopharyngeal swab, presence of viral mutation(s) within the areas targeted by this assay, and inadequate number of viral copies(<138 copies/mL). A negative result must be combined with clinical observations, patient history, and epidemiological information. The expected result is Negative.  Fact Sheet for Patients:  BloggerCourse.com  Fact Sheet for Healthcare Providers:  SeriousBroker.it  This test is no t yet approved or cleared by the [place] FDA and  has been authorized for detection and/or diagnosis of SARS-CoV-2 by FDA under an Emergency Use Authorization (EUA). This EUA will remain  in effect (meaning this test can be used) for the duration of the COVID-19 declaration under Section 564(b)(1) of the Act, 21 U.S.C.section 360bbb-3(b)(1), unless the authorization is terminated  or revoked sooner.       Influenza A by PCR NEGATIVE NEGATIVE Final   Influenza B by PCR NEGATIVE NEGATIVE  Final    Comment: (NOTE) The Xpert Xpress SARS-CoV-2/FLU/RSV plus assay is intended as an aid in the diagnosis of influenza from Nasopharyngeal swab specimens and should not be used as a sole basis for treatment. Nasal washings and aspirates are unacceptable for Xpert Xpress SARS-CoV-2/FLU/RSV testing.  Fact Sheet for Patients: BloggerCourse.com  Fact Sheet for Healthcare Providers: SeriousBroker.it  This test is not yet approved or cleared by the [place] FDA and has been authorized for detection and/or diagnosis of SARS-CoV-2 by FDA under an Emergency Use Authorization (EUA). This EUA will remain in effect (meaning this test can be used) for the duration of the COVID-19 declaration under Section 564(b)(1) of the Act, 21 U.S.C. section 360bbb-3(b)(1), unless the authorization is terminated or revoked.  Performed at [facility], [address] [place], [place] [address]   Culture, blood (routine x 2)     Status: Abnormal (Preliminary result)   Collection Time: 05/28/20 10:45 PM   Specimen: BLOOD RIGHT HAND  Result Value Ref Range Status   Specimen Description BLOOD RIGHT HAND  Final   Special Requests   Final    BOTTLES DRAWN AEROBIC ONLY Blood Culture adequate volume   Culture  Setup Time   Final    GRAM POSITIVE COCCI IN CLUSTERS AEROBIC BOTTLE ONLY CRITICAL VALUE NOTED.  VALUE IS CONSISTENT WITH PREVIOUSLY REPORTED AND CALLED VALUE.    Culture (A)  Final    STAPHYLOCOCCUS CAPITIS SUSCEPTIBILITIES TO FOLLOW Performed at [facility], [address], [place], [place] [address]    Report Status PENDING  Incomplete  Culture, blood (routine x 2)     Status: Abnormal   Collection Time: 05/28/20 10:45 PM   Specimen: BLOOD LEFT HAND  Result Value Ref Range Status   Specimen Description BLOOD LEFT HAND  Final   Special Requests   Final    BOTTLES DRAWN AEROBIC ONLY Blood Culture results may not be  optimal due to an inadequate volume of blood received in  culture bottles   Culture  Setup Time   Final    GRAM POSITIVE COCCI IN CLUSTERS AEROBIC BOTTLE ONLY Organism ID to follow CRITICAL RESULT CALLED TO, READ BACK BY AND VERIFIED WITH: C. AMEND PHARMD, AT 0550 05/30/20 BY D VANHOOK Performed at [facility], [address] [place], [place] [address]    Culture STAPHYLOCOCCUS CAPITIS (A)  Final   Report Status 06/01/2020 FINAL  Final   Organism ID, Bacteria STAPHYLOCOCCUS CAPITIS  Final      Susceptibility   Staphylococcus capitis - MIC*    CIPROFLOXACIN <=0.5 SENSITIVE Sensitive     ERYTHROMYCIN >=8 RESISTANT Resistant     GENTAMICIN <=0.5 SENSITIVE Sensitive     OXACILLIN <=0.25 SENSITIVE Sensitive     TETRACYCLINE <=1 SENSITIVE Sensitive     VANCOMYCIN <=0.5 SENSITIVE Sensitive     TRIMETH/SULFA <=10 SENSITIVE Sensitive     CLINDAMYCIN <=0.25 SENSITIVE Sensitive     RIFAMPIN <=0.5 SENSITIVE Sensitive     Inducible Clindamycin NEGATIVE Sensitive     * STAPHYLOCOCCUS CAPITIS  Blood Culture ID Panel (Reflexed)     Status: Abnormal   Collection Time: 05/28/20 10:45 PM  Result Value Ref Range Status   Enterococcus faecalis NOT DETECTED NOT DETECTED Final   Enterococcus Faecium NOT DETECTED NOT DETECTED Final   Listeria monocytogenes NOT DETECTED NOT DETECTED Final   Staphylococcus species DETECTED (A) NOT DETECTED Final    Comment: CRITICAL RESULT CALLED TO, READ BACK BY AND VERIFIED WITH: C. AMEND PHARMD, AT 0550 05/30/20 BY D VANHOOK    Staphylococcus aureus (BCID) NOT DETECTED NOT DETECTED Final   Staphylococcus epidermidis NOT DETECTED NOT DETECTED Final   Staphylococcus lugdunensis NOT DETECTED NOT DETECTED Final   Streptococcus species NOT DETECTED NOT DETECTED Final   Streptococcus agalactiae NOT DETECTED NOT DETECTED Final   Streptococcus pneumoniae NOT DETECTED NOT DETECTED Final   Streptococcus pyogenes NOT DETECTED NOT DETECTED Final    A.calcoaceticus-baumannii NOT DETECTED NOT DETECTED Final   Bacteroides fragilis NOT DETECTED NOT DETECTED Final   Enterobacterales NOT DETECTED NOT DETECTED Final   Enterobacter cloacae complex NOT DETECTED NOT DETECTED Final   Escherichia coli NOT DETECTED NOT DETECTED Final   Klebsiella aerogenes NOT DETECTED NOT DETECTED Final   Klebsiella oxytoca NOT DETECTED NOT DETECTED Final   Klebsiella pneumoniae NOT DETECTED NOT DETECTED Final   Proteus species NOT DETECTED NOT DETECTED Final   Salmonella species NOT DETECTED NOT DETECTED Final   Serratia marcescens NOT DETECTED NOT DETECTED Final   Haemophilus influenzae NOT DETECTED NOT DETECTED Final   Neisseria meningitidis NOT DETECTED NOT DETECTED Final   Pseudomonas aeruginosa NOT DETECTED NOT DETECTED Final   Stenotrophomonas maltophilia NOT DETECTED NOT DETECTED Final   Candida albicans NOT DETECTED NOT DETECTED Final   Candida auris NOT DETECTED NOT DETECTED Final   Candida glabrata NOT DETECTED NOT DETECTED Final   Candida krusei NOT DETECTED NOT DETECTED Final   Candida parapsilosis NOT DETECTED NOT DETECTED Final   Candida tropicalis NOT DETECTED NOT DETECTED Final   Cryptococcus neoformans/gattii NOT DETECTED NOT DETECTED Final    Comment: Performed at [facility], [address] [place], [place] [address]  Culture, blood (routine x 2)     Status: None (Preliminary result)   Collection Time: 05/29/20  2:38 PM   Specimen: BLOOD LEFT HAND  Result Value Ref Range Status   Specimen Description BLOOD LEFT HAND  Final   Special Requests   Final    BOTTLES  DRAWN AEROBIC AND ANAEROBIC Blood Culture results may not be optimal due to an inadequate volume of blood received in culture bottles   Culture   Final    NO GROWTH 4 DAYS Performed at [facility], [address] [place], [place] [address]    Report Status PENDING  Incomplete  Culture, blood (routine x 2)     Status: None (Preliminary result)   Collection Time:  05/29/20  2:46 PM   Specimen: BLOOD RIGHT HAND  Result Value Ref Range Status   Specimen Description BLOOD RIGHT HAND  Final   Special Requests   Final    BOTTLES DRAWN AEROBIC ONLY Blood Culture results may not be optimal due to an inadequate volume of blood received in culture bottles   Culture   Final    NO GROWTH 4 DAYS Performed at [facility], [address], [place], [place] [address]    Report Status PENDING  Incomplete  Culture, Urine     Status: Abnormal   Collection Time: 05/30/20  5:41 AM   Specimen: Urine, Catheterized  Result Value Ref Range Status   Specimen Description URINE, CATHETERIZED  Final   Special Requests NONE  Final   Culture (A)  Final    <10,000 COLONIES/mL INSIGNIFICANT GROWTH Performed at [facility], [address], [place], [place] [address]    Report Status 05/31/2020 FINAL  Final  MRSA PCR Screening     Status: None   Collection Time: 06/01/20  8:40 PM   Specimen: Nasopharyngeal  Result Value Ref Range Status   MRSA by PCR NEGATIVE NEGATIVE Final    Comment:        The GeneXpert MRSA Assay (FDA approved for NASAL specimens only), is one component of a comprehensive MRSA colonization surveillance program. It is not intended to diagnose MRSA infection nor to guide or monitor treatment for MRSA infections. Performed at [facility], [address], [place], [place] [address]      Rexene Alberts, MSN, NP-C [facility]  [email or web address] Pager: [phone] Office: [phone] [facility] Main Line: [phone]

## 2020-06-02 NOTE — Progress Notes (Signed)
NAME:  Pamela Hudson, MRN:  [id number], DOB:  [birth year], LOS: 6 ADMISSION DATE:  05/27/2020, CONSULTATION DATE:  06/01/20 REFERRING MD:  Lupita Dawn MD, CHIEF COMPLAINT:  Shock, SVT  History of Present Illness:   35 year old with sickle cell, pulmonary hypertension, CKD, diabetes admitted with dyspnea, CHF worsening pulmonary hypertension with RV failure, severe tricuspid regurgitation, staph bactermia.  Being followed by cardiology on milrinone  Went into SVT today.  Rapid response called.  She failed vagal maneuvers, given adenosine x 4, started on IV amnio with conversion to normal sinus rhythm.  Neo-Synephrine started for hypotension.  PCCM consulted  Pertinent  Medical History   Cardiomegaly, CHF, chronic pain, CVA, HTN, PCOS, sickle cell anemia, DM2, chronic right heart strain, pulmonary Hypertension, CKD stage 3, HTN, transient A.fibCOVID 2020  Significant Hospital Events: Including procedures, antibiotic start and stop dates in addition to other pertinent events   3/15-Admit 3/16-Staph capitis bacteremia 3/17-underwent right heart cath with findings of very mild PAH, severe RV failure with preserved cardiac output.  No evidence of intracardiac shunt 3/18- Started milrinone and diuresed 3/19 Transfuse PRBC, ID consulted and vancomycin started 3/20- SVT > to ICU 3/21 remains on amiodarone infusion.  Interim History / Subjective:   No chest pain, complains of left lower extremity pain.  Objective   Blood pressure 100/79, pulse 96, temperature 98.3 F (36.8 C), temperature source Oral, resp. rate (!) 21, height '4\' 11"'  (1.499 m), weight 66.5 kg, SpO2 97 %.        Intake/Output Summary (Last 24 hours) at 06/02/2020 0705 Last data filed at 06/02/2020 0400 Gross per 24 hour  Intake 1439.46 ml  Output 150 ml  Net 1289.46 ml   Filed Weights   05/31/20 0444 06/01/20 0705 06/02/20 0600  Weight: 64.2 kg 64.8 kg 66.5 kg    Examination: Gen:      Young female, resting in  bed HEENT: Tracking appropriately, NCAT Lungs:   Clear to auscultation bilaterally no crackles no wheeze CV:        systolic murmur, regular rate rhythm Abd:   Bowel sounds present, soft, nontender Ext: No significant edema, pain with palpation of the left great toe Skin:     warm dry, no rash Neuro: Alert, oriented following commands Psych: Normal mood and affect  Labs/imaging personally reviewed    Echo 3/16-severe torrential tricuspid regurgitation with severe RV enlargement.  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 We will discuss with cardiology services.  Staph capitis bacteremia Discussed with ID.  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 Hepatic steatosis on [place] Plan: Continue to monitor labs  Gout Plan: Continue colchicine last dose 06/01/2020 Prednisone 77m X1 XR left foot, history of staph bacteremia, look for any since of OM   Diabetes Plan: SSI plus Lantus  Sickle cell disease, chronic anemia Chronic pain Plan: Continue hydroxyurea Restarted home Dilaudid dosing  Best practice (evaluated daily)  Diet:  Oral Pain/Anxiety/Delirium protocol (if indicated): No VAP protocol (if indicated): Not indicated DVT prophylaxis: LMWH GI prophylaxis: N/A Glucose control:  SSI Yes  and Basal insulin Yes Central venous access:  N/A Arterial line:  N/A Foley:  N/A Mobility:  bed rest  PT consulted: N/A Last date of multidisciplinary goals of care discussion [NA] Code Status:  full code Disposition: ICU  Labs    CBC: Recent Labs  Lab 05/27/20 1604 05/28/20 0221 05/29/20 1925 05/30/20 0541 05/31/20 0351 05/31/20 2130 06/01/20 0500 06/02/20 0413  WBC 12.8* 12.5*   < > 16.1* 15.6* 14.6* 14.2* 15.4*  NEUTROABS 7.9* 7.5  --   --  10.5*  --  9.1* 10.4*  HGB 7.8* 7.4*   < > 6.8* 6.1* 7.7* 7.4* 8.0*  HCT 20.9* 19.9*   < > 18.6* 16.5* 20.6* 19.6* 21.7*  MCV 122.2* 122.8*   < > 121.6* 120.4* 113.2* 112.6* 115.4*  PLT 222 194   < > 184 179 209 201 225   < > = values in this interval not displayed.    Basic Metabolic Panel: Recent Labs  Lab 05/28/20 0221 05/29/20 0615 05/30/20 0541 05/30/20 1717 05/31/20 0835 06/01/20 1017 06/02/20 0413  NA 132*   < > 130* 130* 130* 131* 128*  K 4.9   < > 5.6* 5.5* 4.7 4.5 4.7  CL 107   < > 104 104 104 104 103  CO2 17*   < > 18* 17* 18* 18* 18*  GLUCOSE 170*   < > 140* 275* 224* 144* 148*  BUN 50*   < > 69* 71* 67* 62* 65*  CREATININE 1.63*   < > 2.53* 2.41* 2.14* 1.84* 1.78*  CALCIUM 8.5*   < > 8.2* 8.1* 8.2* 8.2* 8.3*  MG 2.4  --   --   --   --  2.2  --   PHOS 4.5  --   --   --   --   --   --    < > = values in this interval not displayed.   GFR: Estimated Creatinine Clearance: 37.6 mL/min (A) (by C-G formula based on SCr of 1.78 mg/dL (H)). Recent Labs  Lab 05/31/20 0351 05/31/20 2130 06/01/20 0500 06/02/20 0413  WBC 15.6* 14.6* 14.2* 15.4*    Liver Function Tests: Recent Labs  Lab 05/28/20 0221 05/29/20 1129 05/31/20 0351 06/01/20 0500 06/02/20 0413  AST 89* 96* 69* 59* 68*  ALT 85* 82* 67* 37 34  ALKPHOS 106 106 102 105 127*  BILITOT 5.6* 6.4* 5.9* 5.0* 4.8*  PROT 9.5* 8.8* 8.4* 8.6* 8.7*  ALBUMIN 2.8* 2.7* 2.5* 2.5* 2.5*   Recent Labs  Lab 05/27/20 1700  LIPASE 30   No results for input(s): AMMONIA in the last 168 hours.  ABG    Component Value Date/Time   HCO3 17.2 (L) 05/29/2020 1935   TCO2 18 (L) 05/29/2020 1935   ACIDBASEDEF 8.0 (H) 05/29/2020 1935   O2SAT 73.0 05/29/2020 1935     Coagulation  Profile: Recent Labs  Lab 05/28/20 0221  INR 1.4*    Cardiac Enzymes: No results for input(s): CKTOTAL, CKMB, CKMBINDEX, TROPONINI in the last 168 hours.  HbA1C: Hgb A1c MFr Bld  Date/Time Value Ref Range Status  05/28/2020 02:21 AM 5.2 4.8 - 5.6 % Final    Comment:    (NOTE)         Prediabetes: 5.7 - 6.4         Diabetes: >6.4         Glycemic control for adults with diabetes: <7.0   08/31/2019 01:52 PM 7.8 (H) 4.8 - 5.6 % Final  Comment:    (NOTE)         Prediabetes: 5.7 - 6.4         Diabetes: >6.4         Glycemic control for adults with diabetes: <7.0     CBG: Recent Labs  Lab 06/01/20 1535 06/01/20 1958 06/02/20 0009 06/02/20 0334 06/02/20 0642  GLUCAP 218* 260* 197* 146* 89    Review of Systems:   REVIEW OF SYSTEMS:   All negative; except for those that are bolded, which indicate positives.  Constitutional: weight loss, weight gain, night sweats, fevers, chills, fatigue, weakness.  HEENT: headaches, sore throat, sneezing, nasal congestion, post nasal drip, difficulty swallowing, tooth/dental problems, visual complaints, visual changes, ear aches. Neuro: difficulty with speech, weakness, numbness, ataxia. CV:  chest pain, orthopnea, PND, swelling in lower extremities, dizziness, palpitations, syncope.  Resp: cough, hemoptysis, dyspnea, wheezing. GI: heartburn, indigestion, abdominal pain, nausea, vomiting, diarrhea, constipation, change in bowel habits, loss of appetite, hematemesis, melena, hematochezia.  GU: dysuria, change in color of urine, urgency or frequency, flank pain, hematuria. MSK: joint pain or swelling, decreased range of motion. Psych: change in mood or affect, depression, anxiety, suicidal ideations, homicidal ideations. Skin: rash, itching, bruising.  Past Medical History:  She,  has a past medical history of Atrial fibrillation ([place]), Cardiomegaly, CHF (congestive heart failure) ([place]), Chronic kidney disease (CKD), stage III  (moderate) ([place]), Chronic pain, COVID-19, CVA (cerebral infarction) (02/2002), Diabetes mellitus without complication ([place]), Hypertension, Liver disease, PCOS (polycystic ovarian syndrome), Renal stone, Sickle cell anemia ([place]), and Sleep apnea.   Surgical History:   Past Surgical History:  Procedure Laterality Date  . CHOLECYSTECTOMY    . LIVER BIOPSY  2007  . PARATHYROIDECTOMY    . PORTACATH PLACEMENT    . RIGHT HEART CATH N/A 09/01/2018   Procedure: RIGHT HEART CATH;  Surgeon: Adrian Prows, MD;  Location: [facility];  Service: Cardiovascular;  Laterality: N/A;  . RIGHT HEART CATH N/A 05/29/2020   Procedure: RIGHT HEART CATH;  Surgeon: Jolaine Artist, MD;  Location: [facility];  Service: Cardiovascular;  Laterality: N/A;     Social History:   reports that she has quit smoking. Her smoking use included cigarettes. She has never used smokeless tobacco. She reports previous alcohol use. She reports that she does not use drugs.   Family History:  Her family history includes CAD in her father; Diabetes Mellitus II in her father and paternal grandmother.   Allergies Allergies  Allergen Reactions  . Oxycodone-Acetaminophen Swelling and Other (See Comments)    Lips swell  . Prednisone Other (See Comments)    Elevates BGL     Home Medications  Prior to Admission medications   Medication Sig Start Date End Date Taking? Authorizing Provider  digoxin (LANOXIN) 0.125 MG tablet Take 1 tablet (0.125 mg total) by mouth daily. 09/05/19  Yes Georgette Shell, MD  empagliflozin (JARDIANCE) 25 MG TABS tablet Take 25 mg by mouth in the morning and at bedtime.   Yes [provider]  Etonogestrel (IMPLANON [place]) Inject 1 each into the skin once. Implanted spring 2019   Yes [provider]  folic acid (FOLVITE) 1 MG tablet Take 1 mg by mouth daily. 11/21/15  Yes [provider]  hydroxyurea (HYDREA) 500 MG capsule Take 1,000 mg by mouth daily.    Yes  [provider]  insulin aspart (NOVOLOG FLEXPEN) 100 UNIT/ML FlexPen Inject 5 Units into the skin 3 (three) times daily  with meals. Patient taking differently: Inject 5 Units into the skin 3 (three) times daily with meals. Sliding scale 09/04/19  Yes Georgette Shell, MD  insulin glargine (LANTUS) 100 unit/mL SOPN Inject 0.25 mLs (25 Units total) into the skin daily. Patient taking differently: Inject 30 Units into the skin at bedtime. 09/04/19  Yes Georgette Shell, MD  Insulin Pen Needle 32G X 4 MM MISC 5 Units/oz/day by Does not apply route in the morning, at noon, and at bedtime. 09/04/19  Yes Georgette Shell, MD  magnesium oxide (MAG-OX) 400 MG tablet Take 400 mg by mouth daily. 01/26/18  Yes [provider]  metFORMIN (GLUCOPHAGE) 500 MG tablet Take 1,000 mg by mouth 2 (two) times daily with a meal.   Yes [provider]  OXBRYTA 500 MG TABS tablet Take 1,500 tablets by mouth daily. 08/28/19  Yes [provider]  torsemide (DEMADEX) 20 MG tablet Take 1 tablet (20 mg total) by mouth daily. 09/04/19  Yes Georgette Shell, MD  Vitamin D, Ergocalciferol, (DRISDOL) 1.25 MG (50000 UT) CAPS capsule Take 50,000 Units by mouth every 30 (thirty) days. 01/26/18  Yes [provider]  blood glucose meter kit and supplies Dispense based on patient and insurance preference. Use up to four times daily as directed. (FOR ICD-10 E10.9, E11.9). 09/04/19   Georgette Shell, MD  Deferasirox 360 MG TABS Take 720 mg by mouth at bedtime.    [provider]      This patient is critically ill with multiple organ system failure; which, requires frequent high complexity decision making, assessment, support, evaluation, and titration of therapies. This was completed through the application of advanced monitoring technologies and extensive interpretation of multiple databases. During this encounter critical care time was devoted to patient care services  described in this note for 35 minutes.  Garner Nash, DO [facility] 06/02/2020 7:06 AM

## 2020-06-02 NOTE — Progress Notes (Addendum)
Advanced Heart Failure Rounding Note  PCP-Cardiologist: No primary care provider on file.   Subjective:    Developed hemodynamically unable SVT yesterday, HR in the 190s. Failed vagal maneuvers, adenosine x 4, converted on amio gtt. Moved to CCU.  Milrinone stopped for hypotension. Started on Neo.  Rhythm stable. Normal sinus. HR 90s. No further SVT. Remains on amio gtt 30. Now off NEO. SBP low 100s.   On Vanc for staph capitus bacteremia. AF. WBC 14>>15K.   Main complaint is left great toe pain from acute gout flare.   No dyspnea or chest pain but she feels like she is fluid overloaded. Wt up 4 lb.  Only 150 cc in UOP yesterday.   SCr trending down 2.5>>2.1>>1.8   Objective:   Weight Range: 66.5 kg Body mass index is 29.61 kg/m.   Vital Signs:   Temp:  [97.9 F (36.6 C)-98.5 F (36.9 C)] 98.3 F (36.8 C) (03/21 0640) Pulse Rate:  [88-195] 96 (03/21 0700) Resp:  [16-26] 21 (03/21 0700) BP: (80-111)/(60-79) 100/79 (03/21 0700) SpO2:  [92 %-100 %] 97 % (03/21 0700) Weight:  [66.5 kg] 66.5 kg (03/21 0600) Last BM Date: 06/01/20  Weight change: Filed Weights   05/31/20 0444 06/01/20 0705 06/02/20 0600  Weight: 64.2 kg 64.8 kg 66.5 kg    Intake/Output:   Intake/Output Summary (Last 24 hours) at 06/02/2020 0747 Last data filed at 06/02/2020 0400 Gross per 24 hour  Intake 1439.46 ml  Output --  Net 1439.46 ml      Physical Exam    General:  Chronically ill appearing young female. No resp difficulty HEENT: Normal Neck: Supple. JVP elevated to ear . Carotids 2+ bilat; no bruits. No lymphadenopathy or thyromegaly appreciated. Cor: PMI nondisplaced. Regular rate & rhythm. No rubs, gallops or murmurs. Lungs: Clear Abdomen: Soft, nontender, nondistended. No hepatosplenomegaly. No bruits or masses. Good bowel sounds. Extremities: No cyanosis, clubbing, rash, edema Neuro: Alert & orientedx3, cranial nerves grossly intact. moves all 4 extremities w/o difficulty.  Affect pleasant   Telemetry   NSR 90s. No further SVT  EKG    No new EKG to review   Labs    CBC Recent Labs    06/01/20 0500 06/02/20 0413  WBC 14.2* 15.4*  NEUTROABS 9.1* 10.4*  HGB 7.4* 8.0*  HCT 19.6* 21.7*  MCV 112.6* 115.4*  PLT 201 225   Basic Metabolic Panel Recent Labs    95/28/41 1017 06/02/20 0413  NA 131* 128*  K 4.5 4.7  CL 104 103  CO2 18* 18*  GLUCOSE 144* 148*  BUN 62* 65*  CREATININE 1.84* 1.78*  CALCIUM 8.2* 8.3*  MG 2.2  --    Liver Function Tests Recent Labs    06/01/20 0500 06/02/20 0413  AST 59* 68*  ALT 37 34  ALKPHOS 105 127*  BILITOT 5.0* 4.8*  PROT 8.6* 8.7*  ALBUMIN 2.5* 2.5*   No results for input(s): LIPASE, AMYLASE in the last 72 hours. Cardiac Enzymes No results for input(s): CKTOTAL, CKMB, CKMBINDEX, TROPONINI in the last 72 hours.  BNP: BNP (last 3 results) Recent Labs    05/27/20 1733 05/29/20 0615  BNP 1,228.5* 3,195.8*    ProBNP (last 3 results) No results for input(s): PROBNP in the last 8760 hours.   D-Dimer No results for input(s): DDIMER in the last 72 hours. Hemoglobin A1C No results for input(s): HGBA1C in the last 72 hours. Fasting Lipid Panel No results for input(s): CHOL, HDL, LDLCALC, TRIG, CHOLHDL, LDLDIRECT  in the last 72 hours. Thyroid Function Tests No results for input(s): TSH, T4TOTAL, T3FREE, THYROIDAB in the last 72 hours.  Invalid input(s): FREET3  Other results:   Imaging     No results found.   Medications:     Scheduled Medications: . Chlorhexidine Gluconate Cloth  6 each Topical Daily  . enoxaparin (LOVENOX) injection  30 mg Subcutaneous Q24H  . feeding supplement  237 mL Oral BID BM  . hydroxyurea  1,000 mg Oral Daily  . insulin aspart  0-9 Units Subcutaneous Q4H  . insulin glargine  25 Units Subcutaneous QHS  . multivitamin with minerals  1 tablet Oral Daily  . sodium chloride flush  10-40 mL Intracatheter Q12H     Infusions: . sodium chloride Stopped  (06/01/20 1530)  . amiodarone 30 mg/hr (06/02/20 0400)  . phenylephrine (NEO-SYNEPHRINE) Adult infusion Stopped (06/01/20 2205)  . vancomycin Stopped (06/01/20 1504)     PRN Medications:  sodium chloride, acetaminophen, guaiFENesin-dextromethorphan, heparin lock flush, heparin lock flush, ondansetron (ZOFRAN) IV, ondansetron (ZOFRAN) IV, sodium chloride flush, sodium chloride flush   Assessment/Plan    1. 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 A/C Diastolic HF w/ Predominate RV Failure - Echo 45-50%, RV severely reduced, RVSP 34   - RHC w/ PCWP 16, preserved CO, low PAPi 0.9  - Diuretics temp held for hypotension  - Milrinone added 8/18 for RV support, Stopped 3/20 for SVT and hypotension  - Wt up 4 lb after IVF boluses, decreased UOP. SBP low 100s - Give 80 IV Lasix x 1   3. AKI on CKD  - Creatinine on admit 1.5-->2.5 - Improving, 1.8 today  - follow BMP   4. SVT  - now off milrinone - maintaining NSR w/ IV amio  - monitor on tele   5. Sickle Cell Anemia  - per primary team   6. TR - Severe on echo - likely functional from severely dilated RV  - ? Primary leaflet pathology such as carcinoid involvement  - ? Endocarditis given bacteremia. Consider TEE to better assess the valve   7. Hyperkalemia - resolved w/ Lokelma  - K 4.7 today, monitor   8. Bacteremia - BCs 3/16 + for Staph capitus - BCs 3/17 NGTD - WBC 16>>15. AF  - on abx, Vanc. ID following  - Will need TEE to better assess Tricuspid Valve and r/o Endocarditis   9. Acute Gout - Lt Great toe - colchicine on hold w/ AKI  - PRN dilaudid ordered    Length of Stay: [address] PA-C  06/02/2020, 7:47 AM  Advanced Heart Failure Team Pager [phone] (M-F; 7a - 5p)  Please contact [facility] for night-coverage after hours (4p -7a ) and weekends on [email or web address]  Patient seen and examined with the above-signed  Advanced Practice Provider and/or Housestaff. I personally reviewed laboratory data, imaging studies and relevant notes. I independently examined the patient and formulated the important aspects of the plan. I have edited the note to reflect any of my changes or salient points. I have personally discussed the plan with the patient and/or family.  Looks better today. No further SVT. Maintaining NSR on amio. C/o gout in left LE.   BCx + for staph capitis. IFD recommending port removal and TEE .  General:  Lying in bed . No resp difficulty HEENT: normal Neck: supple. JVP to ear. Carotids 2+ bilat;  no bruits. No lymphadenopathy or thryomegaly appreciated. Cor: PMI nondisplaced. Regular rate & rhythm. 2/6 TR Lungs: clear Abdomen: soft, nontender, nondistended. No hepatosplenomegaly. No bruits or masses. Good bowel sounds. Extremities: no cyanosis, clubbing, rash, edema Neuro: alert & orientedx3, cranial nerves grossly intact. moves all 4 extremities w/o difficulty. Affect pleasant  Off milrinone due to SVT. Continue amio. Will give prednisone for acute gout. Plan TEE to evaluate for endocarditis. Resume diuretics for RHF.   I made NPO after MN and cut lantus in half tonight   Arvilla Meres, MD  5:50 PM

## 2020-06-03 ENCOUNTER — Encounter (HOSPITAL_COMMUNITY): Payer: Self-pay | Admitting: Pulmonary Disease

## 2020-06-03 ENCOUNTER — Inpatient Hospital Stay (HOSPITAL_COMMUNITY): Payer: Medicaid Other

## 2020-06-03 ENCOUNTER — Encounter (HOSPITAL_COMMUNITY)
Admission: EM | Disposition: A | Discharge status: Home / Self Care | Payer: Self-pay | Source: Home / Self Care | Attending: Internal Medicine

## 2020-06-03 DIAGNOSIS — I361 Nonrheumatic tricuspid (valve) insufficiency: Secondary | ICD-10-CM

## 2020-06-03 DIAGNOSIS — E119 Type 2 diabetes mellitus without complications: Secondary | ICD-10-CM

## 2020-06-03 DIAGNOSIS — D72829 Elevated white blood cell count, unspecified: Secondary | ICD-10-CM | POA: Diagnosis not present

## 2020-06-03 DIAGNOSIS — I50813 Acute on chronic right heart failure: Secondary | ICD-10-CM | POA: Diagnosis not present

## 2020-06-03 DIAGNOSIS — R0600 Dyspnea, unspecified: Secondary | ICD-10-CM | POA: Diagnosis not present

## 2020-06-03 DIAGNOSIS — I509 Heart failure, unspecified: Secondary | ICD-10-CM | POA: Diagnosis not present

## 2020-06-03 DIAGNOSIS — I313 Pericardial effusion (noninflammatory): Secondary | ICD-10-CM

## 2020-06-03 DIAGNOSIS — R7881 Bacteremia: Secondary | ICD-10-CM | POA: Diagnosis not present

## 2020-06-03 HISTORY — PX: TEE WITHOUT CARDIOVERSION: SHX5443

## 2020-06-03 LAB — BASIC METABOLIC PANEL
Anion gap: 8 (ref 5–15)
Anion gap: 7 (ref 5–15)
BUN: 68 mg/dL — ABNORMAL HIGH (ref 6–20)
BUN: 67 mg/dL — ABNORMAL HIGH (ref 6–20)
CO2: 18 mmol/L — ABNORMAL LOW (ref 22–32)
CO2: 19 mmol/L — ABNORMAL LOW (ref 22–32)
Calcium: 8.6 mg/dL — ABNORMAL LOW (ref 8.9–10.3)
Calcium: 8.8 mg/dL — ABNORMAL LOW (ref 8.9–10.3)
Chloride: 102 mmol/L (ref 98–111)
Chloride: 104 mmol/L (ref 98–111)
Creatinine, Ser: 1.84 mg/dL — ABNORMAL HIGH (ref 0.44–1.00)
Creatinine, Ser: 1.62 mg/dL — ABNORMAL HIGH (ref 0.44–1.00)
GFR, Estimated: 37 mL/min — ABNORMAL LOW (ref >60)
GFR, Estimated: 43 mL/min — ABNORMAL LOW (ref >60)
Glucose, Bld: 382 mg/dL — ABNORMAL HIGH (ref 70–99)
Glucose, Bld: 213 mg/dL — ABNORMAL HIGH (ref 70–99)
Potassium: 5.3 mmol/L — ABNORMAL HIGH (ref 3.5–5.1)
Potassium: 5.5 mmol/L — ABNORMAL HIGH (ref 3.5–5.1)
Sodium: 128 mmol/L — ABNORMAL LOW (ref 135–145)
Sodium: 130 mmol/L — ABNORMAL LOW (ref 135–145)

## 2020-06-03 LAB — CBC WITH DIFFERENTIAL/PLATELET
Abs Immature Granulocytes: 0.17 10*3/uL — ABNORMAL HIGH (ref 0.00–0.07)
Basophils Absolute: 0 10*3/uL (ref 0.0–0.1)
Basophils Relative: 0 %
Eosinophils Absolute: 0 10*3/uL (ref 0.0–0.5)
Eosinophils Relative: 0 %
HCT: 23.4 % — ABNORMAL LOW (ref 36.0–46.0)
Hemoglobin: 8.9 g/dL — ABNORMAL LOW (ref 12.0–15.0)
Immature Granulocytes: 1 %
Lymphocytes Relative: 7 %
Lymphs Abs: 1.1 10*3/uL (ref 0.7–4.0)
MCH: 42.8 pg — ABNORMAL HIGH (ref 26.0–34.0)
MCHC: 38 g/dL — ABNORMAL HIGH (ref 30.0–36.0)
MCV: 112.5 fL — ABNORMAL HIGH (ref 80.0–100.0)
Monocytes Absolute: 2 10*3/uL — ABNORMAL HIGH (ref 0.1–1.0)
Monocytes Relative: 13 %
Neutro Abs: 12.4 10*3/uL — ABNORMAL HIGH (ref 1.7–7.7)
Neutrophils Relative %: 79 %
Platelets: 191 10*3/uL (ref 150–400)
RBC: 2.08 MIL/uL — ABNORMAL LOW (ref 3.87–5.11)
RDW: 21 % — ABNORMAL HIGH (ref 11.5–15.5)
WBC: 15.7 10*3/uL — ABNORMAL HIGH (ref 4.0–10.5)
nRBC: 11.5 % — ABNORMAL HIGH (ref 0.0–0.2)

## 2020-06-03 LAB — CULTURE, BLOOD (ROUTINE X 2)
Culture: NO GROWTH
Culture: NO GROWTH
Special Requests: ADEQUATE

## 2020-06-03 LAB — GLUCOSE, CAPILLARY
Glucose-Capillary: 158 mg/dL — ABNORMAL HIGH (ref 70–99)
Glucose-Capillary: 200 mg/dL — ABNORMAL HIGH (ref 70–99)
Glucose-Capillary: 219 mg/dL — ABNORMAL HIGH (ref 70–99)
Glucose-Capillary: 299 mg/dL — ABNORMAL HIGH (ref 70–99)
Glucose-Capillary: 330 mg/dL — ABNORMAL HIGH (ref 70–99)
Glucose-Capillary: 341 mg/dL — ABNORMAL HIGH (ref 70–99)
Glucose-Capillary: 365 mg/dL — ABNORMAL HIGH (ref 70–99)

## 2020-06-03 LAB — HAPTOGLOBIN: Haptoglobin: <10 mg/dL — ABNORMAL LOW (ref 33–278)

## 2020-06-03 SURGERY — ECHOCARDIOGRAM, TRANSESOPHAGEAL
Anesthesia: Moderate Sedation

## 2020-06-03 MED ORDER — DIPHENHYDRAMINE HCL 50 MG/ML IJ SOLN
INTRAMUSCULAR | Status: AC
  Filled 2020-06-03: qty 1

## 2020-06-03 MED ORDER — FENTANYL CITRATE (PF) 100 MCG/2ML IJ SOLN
INTRAMUSCULAR | Status: DC | PRN
  Administered 2020-06-03: 50 ug via INTRAVENOUS

## 2020-06-03 MED ORDER — FUROSEMIDE 10 MG/ML IJ SOLN
80.0000 mg | Freq: Two times a day (BID) | INTRAMUSCULAR | Status: AC
  Administered 2020-06-03: 80 mg via INTRAVENOUS
  Filled 2020-06-03 (×2): qty 8

## 2020-06-03 MED ORDER — LIDOCAINE VISCOUS HCL 2 % MT SOLN
OROMUCOSAL | Status: AC
  Filled 2020-06-03: qty 15

## 2020-06-03 MED ORDER — HEPARIN SOD (PORK) LOCK FLUSH 100 UNIT/ML IV SOLN
500.0000 [IU] | INTRAVENOUS | Status: DC | PRN
  Filled 2020-06-03: qty 5

## 2020-06-03 MED ORDER — SODIUM ZIRCONIUM CYCLOSILICATE 10 G PO PACK
10.0000 g | PACK | Freq: Once | ORAL | Status: AC
  Administered 2020-06-03: 10 g via ORAL
  Filled 2020-06-03: qty 1

## 2020-06-03 MED ORDER — AMIODARONE HCL IN DEXTROSE 360-4.14 MG/200ML-% IV SOLN
60.0000 mg/h | INTRAVENOUS | Status: DC
  Administered 2020-06-03 (×2): 60 mg/h via INTRAVENOUS
  Filled 2020-06-03: qty 200

## 2020-06-03 MED ORDER — MIDAZOLAM HCL (PF) 10 MG/2ML IJ SOLN
INTRAMUSCULAR | Status: DC | PRN
  Administered 2020-06-03 (×2): 2 mg via INTRAVENOUS
  Administered 2020-06-03 (×2): 1 mg via INTRAVENOUS

## 2020-06-03 MED ORDER — AMIODARONE LOAD VIA INFUSION
150.0000 mg | Freq: Once | INTRAVENOUS | Status: AC
  Administered 2020-06-03: 150 mg via INTRAVENOUS
  Filled 2020-06-03: qty 83.34

## 2020-06-03 MED ORDER — PHENYLEPHRINE 40 MCG/ML (10ML) SYRINGE FOR IV PUSH (FOR BLOOD PRESSURE SUPPORT)
PREFILLED_SYRINGE | INTRAVENOUS | Status: DC | PRN
  Administered 2020-06-03 (×2): 80 ug via INTRAVENOUS

## 2020-06-03 MED ORDER — AMIODARONE HCL IN DEXTROSE 360-4.14 MG/200ML-% IV SOLN
30.0000 mg/h | INTRAVENOUS | Status: DC
  Administered 2020-06-03 – 2020-06-06 (×6): 30 mg/h via INTRAVENOUS
  Filled 2020-06-03 (×7): qty 200

## 2020-06-03 MED ORDER — ENOXAPARIN SODIUM 30 MG/0.3ML ~~LOC~~ SOLN
30.0000 mg | SUBCUTANEOUS | Status: DC
  Administered 2020-06-05 – 2020-06-11 (×7): 30 mg via SUBCUTANEOUS
  Filled 2020-06-03 (×7): qty 0.3

## 2020-06-03 MED ORDER — MIDAZOLAM HCL (PF) 5 MG/ML IJ SOLN
INTRAMUSCULAR | Status: AC
  Filled 2020-06-03: qty 3

## 2020-06-03 MED ORDER — SODIUM CHLORIDE 0.9 % IV SOLN: INTRAVENOUS | Status: DC

## 2020-06-03 MED ORDER — FENTANYL CITRATE (PF) 100 MCG/2ML IJ SOLN
INTRAMUSCULAR | Status: AC
  Filled 2020-06-03: qty 6

## 2020-06-03 NOTE — H&P (View-Only) (Signed)
I have patient scheduled for TEE today at 3pm in Endo.   Jenese Mischke, MD  8:20 AM   

## 2020-06-03 NOTE — Progress Notes (Signed)
Spoke with Dr. Jearld Pies patient due for port-a-cath removal today.  Hold daily dose of lovenox.  Administer after procedure.

## 2020-06-03 NOTE — Progress Notes (Signed)
Inpatient Diabetes Program Recommendations  AACE/ADA: New Consensus Statement on Inpatient Glycemic Control   Target Ranges:  Prepandial:   less than 140 mg/dL      Peak postprandial:   less than 180 mg/dL (1-2 hours)      Critically ill patients:  140 - 180 mg/dL   Results for Pamela Hudson, Pamela Hudson (MRN [id number]) as of 06/03/2020 09:03  Ref. Range 06/02/2020 06:42 06/02/2020 11:30 06/02/2020 16:11 06/02/2020 19:30 06/02/2020 23:40 06/03/2020 03:13 06/03/2020 07:55  Glucose-Capillary Latest Ref Range: 70 - 99 mg/dL 89 785 (H) 885 (H) 027 (H) 365 (H) 299 (H) 219 (H)   Review of Glycemic Control  Diabetes history: DM2 Outpatient Diabetes medications: Lantus 30 units QHS, Novolog 5 units TID with meals, Jardiance 25 mg BID, Metformin 1000 mg BID Current orders for Inpatient glycemic control: Lantus 12 units QHS, Novolog 0-9 units Q4H  Inpatient Diabetes Program Recommendations:    Insulin: Please consider increasing Lantus to 20 units QHS. Once diet resumed, please consider changing CBGs to AC&HS and Novolog to 0-9 units AC&HS.  Thanks, Orlando Penner, RN, MSN, CDE Diabetes Coordinator Inpatient Diabetes Program [phone] (Team Pager from 8am to 5pm)

## 2020-06-03 NOTE — Interval H&P Note (Signed)
History and Physical Interval Note:  06/03/2020 2:08 PM  Pamela Hudson  has presented today for surgery, with the diagnosis of bactermia  The various methods of treatment have been discussed with the patient and family. After consideration of risks, benefits and other options for treatment, the patient has consented to  Procedure(s): TRANSESOPHAGEAL ECHOCARDIOGRAM (TEE) (N/A) as a surgical intervention.  The patient's history has been reviewed, patient examined, no change in status, stable for surgery.  I have reviewed the patient's chart and labs.  Questions were answered to the patient's satisfaction.     Thurmon Mizell

## 2020-06-03 NOTE — Progress Notes (Signed)
Amio 150mg  IV administered

## 2020-06-03 NOTE — Progress Notes (Signed)
  Echocardiogram Echocardiogram Transesophageal has been performed.  Pamela Hudson 06/03/2020, 3:25 PM

## 2020-06-03 NOTE — Progress Notes (Addendum)
Advanced Heart Failure Rounding Note  PCP-Cardiologist: No primary care provider on file.   Subjective:    3/20 Developed hemodynamically unable SVT yesterday, HR in the 190s. Failed vagal maneuvers, adenosine x 4, converted on amio gtt. Moved to CCU.  Milrinone stopped for hypotension. Started on Neo.  No further SVT. Sinus tach currently, HR 110s. BP stable off Neo.   Being treated for staph capitus bacteremia. ID following. Abx changed Ancef. AF. WBC elevated but stable at 15K. Awaiting TEE.   Remains fluid overloaded. Wt up 1 lb from yesterday. No current resting dyspnea.   SCr has plateaued ~1.8. K 5.3  Toe pain improving w/ prednisone.    Objective:   Weight Range: 67 kg Body mass index is 29.83 kg/m.   Vital Signs:   Temp:  [98 F (36.7 C)-99 F (37.2 C)] 98.4 F (36.9 C) (03/22 0757) Pulse Rate:  [100-108] 103 (03/22 0700) Resp:  [15-26] 23 (03/22 0700) BP: (97-118)/(69-81) 103/70 (03/22 0700) SpO2:  [94 %-100 %] 99 % (03/22 0700) Weight:  [67 kg] 67 kg (03/22 0500) Last BM Date: 06/01/20  Weight change: Filed Weights   06/01/20 0705 06/02/20 0600 06/03/20 0500  Weight: 64.8 kg 66.5 kg 67 kg    Intake/Output:   Intake/Output Summary (Last 24 hours) at 06/03/2020 0806 Last data filed at 06/03/2020 0700 Gross per 24 hour  Intake 421.7 ml  Output --  Net 421.7 ml      Physical Exam    General:  Chronically ill appearing. Looks older than actual age. No resp difficulty HEENT: Normal Neck: Supple. Distended neck veins. J VP elevated to ear . Carotids 2+ bilat; no bruits. No lymphadenopathy or thyromegaly appreciated. + port rt upper chest  Cor: PMI nondisplaced. Regular rhythm, tachy rate.  3/6 TR murmur  Lungs: Clear Abdomen: Soft, nontender, nondistended. No hepatosplenomegaly. No bruits or masses. Good bowel sounds. Extremities: No cyanosis, clubbing, rash, trace bilateral dema Neuro: Alert & orientedx3, cranial nerves grossly intact. moves all  4 extremities w/o difficulty. Affect pleasant  Telemetry   Sinus tach 100s. No further SVT  EKG    No new EKG to review   Labs    CBC Recent Labs    06/02/20 0413 06/03/20 0024  WBC 15.4* 15.7*  NEUTROABS 10.4* 12.4*  HGB 8.0* 8.9*  HCT 21.7* 23.4*  MCV 115.4* 112.5*  PLT 225 191   Basic Metabolic Panel Recent Labs    51/70/01 1017 06/02/20 0413 06/03/20 0024  NA 131* 128* 128*  K 4.5 4.7 5.3*  CL 104 103 102  CO2 18* 18* 18*  GLUCOSE 144* 148* 382*  BUN 62* 65* 68*  CREATININE 1.84* 1.78* 1.84*  CALCIUM 8.2* 8.3* 8.6*  MG 2.2  --   --    Liver Function Tests Recent Labs    06/01/20 0500 06/02/20 0413  AST 59* 68*  ALT 37 34  ALKPHOS 105 127*  BILITOT 5.0* 4.8*  PROT 8.6* 8.7*  ALBUMIN 2.5* 2.5*   No results for input(s): LIPASE, AMYLASE in the last 72 hours. Cardiac Enzymes No results for input(s): CKTOTAL, CKMB, CKMBINDEX, TROPONINI in the last 72 hours.  BNP: BNP (last 3 results) Recent Labs    05/27/20 1733 05/29/20 0615  BNP 1,228.5* 3,195.8*    ProBNP (last 3 results) No results for input(s): PROBNP in the last 8760 hours.   D-Dimer No results for input(s): DDIMER in the last 72 hours. Hemoglobin A1C No results for input(s): HGBA1C in  the last 72 hours. Fasting Lipid Panel No results for input(s): CHOL, HDL, LDLCALC, TRIG, CHOLHDL, LDLDIRECT in the last 72 hours. Thyroid Function Tests No results for input(s): TSH, T4TOTAL, T3FREE, THYROIDAB in the last 72 hours.  Invalid input(s): FREET3  Other results:   Imaging    DG Foot 2 Views Left  Result Date: 06/02/2020 CLINICAL DATA:  Pain EXAM: LEFT FOOT - 2 VIEW COMPARISON:  August 20, 2019 FINDINGS: Frontal and lateral views were obtained. No fracture or dislocation. Joint spaces appear normal. No erosive change. There is a small inferior calcaneal spur. IMPRESSION: No fracture or dislocation. No evident arthropathy. There is a small inferior calcaneal spur. Electronically Signed    By: Bretta Bang III M.D.   On: 06/02/2020 12:12     Medications:     Scheduled Medications: . amiodarone  200 mg Oral BID  . Chlorhexidine Gluconate Cloth  6 each Topical Daily  . enoxaparin (LOVENOX) injection  30 mg Subcutaneous Q24H  . feeding supplement  237 mL Oral BID BM  . hydroxyurea  1,000 mg Oral Daily  . insulin aspart  0-9 Units Subcutaneous Q4H  . insulin glargine  12 Units Subcutaneous QHS  . multivitamin with minerals  1 tablet Oral Daily  . predniSONE  20 mg Oral Q breakfast   Followed by  . [START ON 06/05/2020] predniSONE  10 mg Oral Q breakfast  . [START ON 06/07/2020] predniSONE  5 mg Oral Q breakfast  . sodium chloride flush  10-40 mL Intracatheter Q12H    Infusions: . sodium chloride 10 mL/hr at 06/03/20 0700  .  ceFAZolin (ANCEF) IV Stopped (06/03/20 7517)    PRN Medications: sodium chloride, acetaminophen, guaiFENesin-dextromethorphan, heparin lock flush, heparin lock flush, HYDROmorphone, ondansetron (ZOFRAN) IV, ondansetron (ZOFRAN) IV, sodium chloride flush, sodium chloride flush   Assessment/Plan    1. 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 AKI on CKD  - Creatinine on admit 1.5-->2.5 - Improving, 1.8 today (stable) - follow BMP   4. SVT  - now off milrinone - maintaining NSR on PO amio  - monitor on tele   5. Sickle Cell Anemia  - per primary team   6. TR - Severe on echo - likely functional from severely dilated RV  - ? Primary leaflet pathology such as carcinoid involvement  - ? Endocarditis given bacteremia. Plan TEE today to better assess the valve   7. Hyperkalemia - 5.3 today  -  Give IV Lasix and repeat BMP at noon   8. Bacteremia - BCs 3/16 + for Staph capitus - BCs 3/17 NGTD - WBC 16>>15. AF  - on abx, Ancef. ID following  - Will need TEE to better assess Tricuspid Valve and r/o Endocarditis  - Plan port removal today   9. Acute Gout - Lt Great toe - colchicine on hold w/ AKI  - Improving w/ prednisone. Treat x 3 days   Length of Stay: [address] PA-C  06/03/2020, 8:06 AM  Advanced Heart Failure Team Pager [phone] (M-F; 7a - 5p)  Please contact [facility] for night-coverage after hours (4p -7a ) and weekends on [email or web address]  Patient seen and examined with the above-signed Advanced Practice Provider and/or Housestaff. I personally reviewed laboratory data, imaging studies and relevant notes. I independently examined the patient and formulated the important aspects of the plan. I have edited the note to reflect any of my changes or salient points. I have personally discussed the plan with the patient and/or family.  No further SVT. Now on po amio. Remains fulid overloaded. Creatinine improved. On ancef for staph capitis bacteremia. No f/c. Gout pain improving with prednisone  General:  Lying in bed  No resp difficulty HEENT: normal Neck: supple. JVP to ear Carotids 2+ bilat; no bruits. No lymphadenopathy or thryomegaly appreciated. Cor: PMI nondisplaced. Regular rate & rhythm. 2/6 TR Lungs: clear Abdomen: soft, nontender, nondistended. No hepatosplenomegaly. No bruits or masses. Good bowel sounds. Extremities: no cyanosis, clubbing, rash, edema Neuro: alert & orientedx3, cranial nerves grossly intact. moves all 4 extremities w/o difficulty. Affect pleasant  Agree with IV lasix. Watch creatinine closely. Will plan TEE today to look for endocarditis. Port removal scheduled for today as well. Continue prednisone for gout.   Arvilla Meres, MD  9:14 AM

## 2020-06-03 NOTE — Progress Notes (Addendum)
EKG completed and New order received.

## 2020-06-03 NOTE — Progress Notes (Signed)
[facility]  Date of Admission:  05/27/2020      Total days of antibiotics 5  Cefazolin 2  Vancomycin 2 days (stopped 3/21)          ASSESSMENT: Pamela GongDevon Woodson is a 35 y.o. female here with acute on chronic exacerbation of right sided heart failure / pulmonary artery hypertension.  She was also found to have leukocytosis, fever and staphylococcus capitus growing in 2/4 blood culture bottles from two sites c/w true bacteremia. Concern over her chronic chest port being nidus for infection.   Leukocytosis trending down to and within baseline chronic levels - ranges from around 11-16K in reviewing her outpatient records. She is not on maintenance prednisone for gout flare, only x 1 dose. No new growth from repeated blood cultures. Repeat in AM after removed port.   PIVs able to be placed overnight - planning IR to remove port today. We discussed potentially holding off on replacing the port until she can discuss ongoing need with her regular hematologist. She does not require maintenance infusions any longer and only accesses it every 3 months for blood draws with routine checks. She is not a resounding fan   TEE scheduled this afternoon to r/o endocarditis - will help with treatment plan. If she has no endocarditis can do a much shorter course of treatment.  Tolerating cefazolin well at this time.    PLAN: 1. Continue cefazolin  2. IR to remove her port - would prefer to have her hold on replacing until she can d/w her hematologist for need going forward  3. Follow TEE results  4. WBC seems to be closing in on her normal chronic levels.     Active Problems:   Sickle cell anemia (HCC)   Elevated bilirubin   Leukocytosis   Dyspnea   Acute on chronic right-sided heart failure (HCC)   Chronic right-sided heart failure (HCC)   Pulmonary hypertension (HCC)   CKD (chronic kidney disease)   DM2 (diabetes mellitus, type 2) (HCC)   Bacteremia   SVT  (supraventricular tachycardia) (HCC)   . Chlorhexidine Gluconate Cloth  6 each Topical Daily  . enoxaparin (LOVENOX) injection  30 mg Subcutaneous Q24H  . feeding supplement  237 mL Oral BID BM  . furosemide  80 mg Intravenous BID  . hydroxyurea  1,000 mg Oral Daily  . insulin aspart  0-9 Units Subcutaneous Q4H  . insulin glargine  12 Units Subcutaneous QHS  . multivitamin with minerals  1 tablet Oral Daily  . predniSONE  20 mg Oral Q breakfast   Followed by  . [START ON 06/05/2020] predniSONE  10 mg Oral Q breakfast  . [START ON 06/07/2020] predniSONE  5 mg Oral Q breakfast  . sodium chloride flush  10-40 mL Intracatheter Q12H    SUBJECTIVE: Continues to feel better for the most part. Joint pain is improved with prednisone. Still tachycardic but breathing better. Says she is getting her port out today and heart test soon.   She would prefer the port be replaced as she has "always had it" and "nice to have if she gets hospitalized". 2 PIVs were able to be placed.    Review of Systems: Review of Systems  Constitutional: Negative for chills, fever, malaise/fatigue and weight loss.  HENT: Negative for sore throat.   Respiratory: Positive for cough. Negative for sputum production.   Cardiovascular: Negative for chest pain and leg swelling.  Gastrointestinal: Negative for  abdominal pain, diarrhea and vomiting.  Genitourinary: Negative for dysuria and flank pain.  Musculoskeletal: Positive for joint pain (L foot, R wrist - pain improved). Negative for myalgias and neck pain.  Skin: Negative for rash.  Neurological: Negative for dizziness, tingling and headaches.  Psychiatric/Behavioral: Negative for depression and substance abuse. The patient is not nervous/anxious and does not have insomnia.     Allergies  Allergen Reactions  . Oxycodone-Acetaminophen Swelling and Other (See Comments)    Lips swell  . Prednisone Other (See Comments)    Elevates BGL    OBJECTIVE: Vitals:    06/03/20 0852 06/03/20 0900 06/03/20 1000 06/03/20 1100  BP:  108/73 111/79 106/80  Pulse: (!) 105 (!) 105 (!) 130 (!) 124  Resp:  18 (!) 21 18  Temp:      TempSrc:      SpO2: 99% 99% 100% 100%  Weight:      Height:       Body mass index is 29.83 kg/m.    Physical Exam Vitals and nursing note reviewed.  Constitutional:      Comments: Resting quietly in bed.   HENT:     Mouth/Throat:     Mouth: No oral lesions.     Dentition: No dental abscesses.  Cardiovascular:     Rate and Rhythm: Regular rhythm. Tachycardia present.     Heart sounds: Normal heart sounds. No murmur heard.     Comments: Rate 130s Pulmonary:     Effort: Pulmonary effort is normal.     Breath sounds: Normal breath sounds.  Chest:       Comments: Chest port accessed - some ecchymosis noted to the medial boarder. No tenderness, warmth or fluctuance Abdominal:     General: There is no distension.     Palpations: Abdomen is soft.     Tenderness: There is no abdominal tenderness.  Musculoskeletal:        General: No tenderness. Normal range of motion.     Cervical back: No tenderness.  Lymphadenopathy:     Cervical: No cervical adenopathy.  Skin:    General: Skin is warm and dry.     Capillary Refill: Capillary refill takes less than 2 seconds.     Findings: No rash.  Neurological:     Mental Status: She is alert and oriented to person, place, and time.  Psychiatric:        Judgment: Judgment normal.     Lab Results Lab Results  Component Value Date   WBC 15.7 (H) 06/03/2020   HGB 8.9 (L) 06/03/2020   HCT 23.4 (L) 06/03/2020   MCV 112.5 (H) 06/03/2020   PLT 191 06/03/2020    Lab Results  Component Value Date   CREATININE 1.84 (H) 06/03/2020   BUN 68 (H) 06/03/2020   NA 128 (L) 06/03/2020   K 5.3 (H) 06/03/2020   CL 102 06/03/2020   CO2 18 (L) 06/03/2020    Lab Results  Component Value Date   ALT 34 06/02/2020   AST 68 (H) 06/02/2020   ALKPHOS 127 (H) 06/02/2020   BILITOT 4.8 (H)  06/02/2020     Microbiology: Recent Results (from the past 240 hour(s))  Resp Panel by RT-PCR (Flu A&B, Covid) Nasopharyngeal Swab     Status: None   Collection Time: 05/27/20  4:26 PM   Specimen: Nasopharyngeal Swab; Nasopharyngeal(NP) swabs in vial transport medium  Result Value Ref Range Status   SARS Coronavirus 2 by RT PCR NEGATIVE NEGATIVE Final  Comment: (NOTE) SARS-CoV-2 target nucleic acids are NOT DETECTED.  The SARS-CoV-2 RNA is generally detectable in upper respiratory specimens during the acute phase of infection. The lowest concentration of SARS-CoV-2 viral copies this assay can detect is 138 copies/mL. A negative result does not preclude SARS-Cov-2 infection and should not be used as the sole basis for treatment or other patient management decisions. A negative result may occur with  improper specimen collection/handling, submission of specimen other than nasopharyngeal swab, presence of viral mutation(s) within the areas targeted by this assay, and inadequate number of viral copies(<138 copies/mL). A negative result must be combined with clinical observations, patient history, and epidemiological information. The expected result is Negative.  Fact Sheet for Patients:  BloggerCourse.com  Fact Sheet for Healthcare Providers:  SeriousBroker.it  This test is no t yet approved or cleared by the [place] FDA and  has been authorized for detection and/or diagnosis of SARS-CoV-2 by FDA under an Emergency Use Authorization (EUA). This EUA will remain  in effect (meaning this test can be used) for the duration of the COVID-19 declaration under Section 564(b)(1) of the Act, 21 U.S.C.section 360bbb-3(b)(1), unless the authorization is terminated  or revoked sooner.       Influenza A by PCR NEGATIVE NEGATIVE Final   Influenza B by PCR NEGATIVE NEGATIVE Final    Comment: (NOTE) The Xpert Xpress SARS-CoV-2/FLU/RSV  plus assay is intended as an aid in the diagnosis of influenza from Nasopharyngeal swab specimens and should not be used as a sole basis for treatment. Nasal washings and aspirates are unacceptable for Xpert Xpress SARS-CoV-2/FLU/RSV testing.  Fact Sheet for Patients: BloggerCourse.com  Fact Sheet for Healthcare Providers: SeriousBroker.it  This test is not yet approved or cleared by the [place] FDA and has been authorized for detection and/or diagnosis of SARS-CoV-2 by FDA under an Emergency Use Authorization (EUA). This EUA will remain in effect (meaning this test can be used) for the duration of the COVID-19 declaration under Section 564(b)(1) of the Act, 21 U.S.C. section 360bbb-3(b)(1), unless the authorization is terminated or revoked.  Performed at [facility], [address] [place], [place] [address]   Culture, blood (routine x 2)     Status: Abnormal   Collection Time: 05/28/20 10:45 PM   Specimen: BLOOD RIGHT HAND  Result Value Ref Range Status   Specimen Description BLOOD RIGHT HAND  Final   Special Requests   Final    BOTTLES DRAWN AEROBIC ONLY Blood Culture adequate volume   Culture  Setup Time   Final    GRAM POSITIVE COCCI IN CLUSTERS AEROBIC BOTTLE ONLY CRITICAL VALUE NOTED.  VALUE IS CONSISTENT WITH PREVIOUSLY REPORTED AND CALLED VALUE. Performed at [facility], [address], [place], [place] [address]    Culture STAPHYLOCOCCUS CAPITIS (A)  Final   Report Status 06/03/2020 FINAL  Final   Organism ID, Bacteria STAPHYLOCOCCUS CAPITIS  Final      Susceptibility   Staphylococcus capitis - MIC*    CIPROFLOXACIN <=0.5 SENSITIVE Sensitive     ERYTHROMYCIN >=8 RESISTANT Resistant     GENTAMICIN <=0.5 SENSITIVE Sensitive     OXACILLIN <=0.25 SENSITIVE Sensitive     TETRACYCLINE <=1 SENSITIVE Sensitive     VANCOMYCIN <=0.5 SENSITIVE Sensitive     TRIMETH/SULFA <=10 SENSITIVE Sensitive      CLINDAMYCIN <=0.25 SENSITIVE Sensitive     RIFAMPIN <=0.5 SENSITIVE Sensitive     Inducible Clindamycin NEGATIVE Sensitive     * STAPHYLOCOCCUS  CAPITIS  Culture, blood (routine x 2)     Status: Abnormal   Collection Time: 05/28/20 10:45 PM   Specimen: BLOOD LEFT HAND  Result Value Ref Range Status   Specimen Description BLOOD LEFT HAND  Final   Special Requests   Final    BOTTLES DRAWN AEROBIC ONLY Blood Culture results may not be optimal due to an inadequate volume of blood received in culture bottles   Culture  Setup Time   Final    GRAM POSITIVE COCCI IN CLUSTERS AEROBIC BOTTLE ONLY Organism ID to follow CRITICAL RESULT CALLED TO, READ BACK BY AND VERIFIED WITH: C. AMEND PHARMD, AT 0550 05/30/20 BY D VANHOOK Performed at [facility], [address] [place], [place] [address]    Culture STAPHYLOCOCCUS CAPITIS (A)  Final   Report Status 06/01/2020 FINAL  Final   Organism ID, Bacteria STAPHYLOCOCCUS CAPITIS  Final      Susceptibility   Staphylococcus capitis - MIC*    CIPROFLOXACIN <=0.5 SENSITIVE Sensitive     ERYTHROMYCIN >=8 RESISTANT Resistant     GENTAMICIN <=0.5 SENSITIVE Sensitive     OXACILLIN <=0.25 SENSITIVE Sensitive     TETRACYCLINE <=1 SENSITIVE Sensitive     VANCOMYCIN <=0.5 SENSITIVE Sensitive     TRIMETH/SULFA <=10 SENSITIVE Sensitive     CLINDAMYCIN <=0.25 SENSITIVE Sensitive     RIFAMPIN <=0.5 SENSITIVE Sensitive     Inducible Clindamycin NEGATIVE Sensitive     * STAPHYLOCOCCUS CAPITIS  Blood Culture ID Panel (Reflexed)     Status: Abnormal   Collection Time: 05/28/20 10:45 PM  Result Value Ref Range Status   Enterococcus faecalis NOT DETECTED NOT DETECTED Final   Enterococcus Faecium NOT DETECTED NOT DETECTED Final   Listeria monocytogenes NOT DETECTED NOT DETECTED Final   Staphylococcus species DETECTED (A) NOT DETECTED Final    Comment: CRITICAL RESULT CALLED TO, READ BACK BY AND VERIFIED WITH: C. AMEND PHARMD, AT 0550 05/30/20 BY D VANHOOK     Staphylococcus aureus (BCID) NOT DETECTED NOT DETECTED Final   Staphylococcus epidermidis NOT DETECTED NOT DETECTED Final   Staphylococcus lugdunensis NOT DETECTED NOT DETECTED Final   Streptococcus species NOT DETECTED NOT DETECTED Final   Streptococcus agalactiae NOT DETECTED NOT DETECTED Final   Streptococcus pneumoniae NOT DETECTED NOT DETECTED Final   Streptococcus pyogenes NOT DETECTED NOT DETECTED Final   A.calcoaceticus-baumannii NOT DETECTED NOT DETECTED Final   Bacteroides fragilis NOT DETECTED NOT DETECTED Final   Enterobacterales NOT DETECTED NOT DETECTED Final   Enterobacter cloacae complex NOT DETECTED NOT DETECTED Final   Escherichia coli NOT DETECTED NOT DETECTED Final   Klebsiella aerogenes NOT DETECTED NOT DETECTED Final   Klebsiella oxytoca NOT DETECTED NOT DETECTED Final   Klebsiella pneumoniae NOT DETECTED NOT DETECTED Final   Proteus species NOT DETECTED NOT DETECTED Final   Salmonella species NOT DETECTED NOT DETECTED Final   Serratia marcescens NOT DETECTED NOT DETECTED Final   Haemophilus influenzae NOT DETECTED NOT DETECTED Final   Neisseria meningitidis NOT DETECTED NOT DETECTED Final   Pseudomonas aeruginosa NOT DETECTED NOT DETECTED Final   Stenotrophomonas maltophilia NOT DETECTED NOT DETECTED Final   Candida albicans NOT DETECTED NOT DETECTED Final   Candida auris NOT DETECTED NOT DETECTED Final   Candida glabrata NOT DETECTED NOT DETECTED Final   Candida krusei NOT DETECTED NOT DETECTED Final   Candida parapsilosis NOT DETECTED NOT DETECTED Final   Candida tropicalis NOT DETECTED NOT DETECTED Final   Cryptococcus neoformans/gattii NOT DETECTED NOT DETECTED Final  Comment: Performed at [facility], [address] [place], [place] [address]  Culture, blood (routine x 2)     Status: None   Collection Time: 05/29/20  2:38 PM   Specimen: BLOOD LEFT HAND  Result Value Ref Range Status   Specimen Description BLOOD LEFT HAND  Final   Special  Requests   Final    BOTTLES DRAWN AEROBIC AND ANAEROBIC Blood Culture results may not be optimal due to an inadequate volume of blood received in culture bottles   Culture   Final    NO GROWTH 5 DAYS Performed at [facility], [address], [place], [place] [address]    Report Status 06/03/2020 FINAL  Final  Culture, blood (routine x 2)     Status: None   Collection Time: 05/29/20  2:46 PM   Specimen: BLOOD RIGHT HAND  Result Value Ref Range Status   Specimen Description BLOOD RIGHT HAND  Final   Special Requests   Final    BOTTLES DRAWN AEROBIC ONLY Blood Culture results may not be optimal due to an inadequate volume of blood received in culture bottles   Culture   Final    NO GROWTH 5 DAYS Performed at [facility], [address], [place], [place] [address]    Report Status 06/03/2020 FINAL  Final  Culture, Urine     Status: Abnormal   Collection Time: 05/30/20  5:41 AM   Specimen: Urine, Catheterized  Result Value Ref Range Status   Specimen Description URINE, CATHETERIZED  Final   Special Requests NONE  Final   Culture (A)  Final    <10,000 COLONIES/mL INSIGNIFICANT GROWTH Performed at [facility], [address] [place], [place] [address]    Report Status 05/31/2020 FINAL  Final  MRSA PCR Screening     Status: None   Collection Time: 06/01/20  8:40 PM   Specimen: Nasopharyngeal  Result Value Ref Range Status   MRSA by PCR NEGATIVE NEGATIVE Final    Comment:        The GeneXpert MRSA Assay (FDA approved for NASAL specimens only), is one component of a comprehensive MRSA colonization surveillance program. It is not intended to diagnose MRSA infection nor to guide or monitor treatment for MRSA infections. Performed at [facility], [address] [place], [place] [address]      Rexene Alberts, MSN, NP-C [facility]  [email or web address] Pager: [phone] Office:  [phone] [facility] Main Line: [phone]

## 2020-06-03 NOTE — Progress Notes (Signed)
NAME:  Pamela Hudson, MRN:  [id number], DOB:  Feb 14, 1986, LOS: 7 ADMISSION DATE:  05/27/2020, CONSULTATION DATE:  06/01/20 REFERRING MD:  Evalyn Casco MD, CHIEF COMPLAINT:  Shock, SVT  History of Present Illness:   35 year old with sickle cell, pulmonary hypertension, CKD, diabetes admitted with dyspnea, CHF worsening pulmonary hypertension with RV failure, severe tricuspid regurgitation, staph bactermia.  Being followed by cardiology on milrinone  Went into SVT today.  Rapid response called.  She failed vagal maneuvers, given adenosine x 4, started on IV amnio with conversion to normal sinus rhythm.  Neo-Synephrine started for hypotension.  PCCM consulted  Pertinent  Medical History   Cardiomegaly, CHF, chronic pain, CVA, HTN, PCOS, sickle cell anemia, DM2, chronic right heart strain, pulmonary Hypertension, CKD stage 3, HTN, transient A.fibCOVID 2020  Significant Hospital Events: Including procedures, antibiotic start and stop dates in addition to other pertinent events   3/15-Admit 3/16-Staph capitis bacteremia 3/17-underwent right heart cath with findings of very mild PAH, severe RV failure with preserved cardiac output.  No evidence of intracardiac shunt 3/18- Started milrinone and diuresed 3/19 Transfuse PRBC, ID consulted and vancomycin started 3/20- SVT > to ICU 3/21 remains on amiodarone infusion. 3/22 transfer from ICU  Interim History / Subjective:   Comfortable this morning.  Getting up to work with PT  Objective   Blood pressure 102/74, pulse (!) 105, temperature 98.4 F (36.9 C), temperature source Oral, resp. rate 20, height 4\' 11"  (1.499 m), weight 67 kg, SpO2 99 %.        Intake/Output Summary (Last 24 hours) at 06/03/2020 0905 Last data filed at 06/03/2020 0800 Gross per 24 hour  Intake 405.01 ml  Output 350 ml  Net 55.01 ml   Filed Weights   06/01/20 0705 06/02/20 0600 06/03/20 0500  Weight: 64.8 kg 66.5 kg 67 kg    Examination: Gen:      Young female  comfortable resting in bed HEENT: Tracking appropriately Lungs:   Clear to auscultation bilaterally CV:        Systolic murmur, regular rhythm S1-S2 Chest: Left port placement Abd:   Bowel sounds present, soft nontender Ext: No significant edema Skin:    Warm dry, no rash Neuro: Alert, oriented following commands Psych: Normal mood and affect  Labs/imaging personally reviewed    Echo 3/16-severe torrential tricuspid regurgitation with severe RV enlargement.  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 Plan: Continue vancomycin TEE scheduled Awaiting removal of port which is planned for today We will plan for line holiday The IVs have been obtained  CKD Follow renal function and urine output  Elevated LFTs, likely congestive hepatitis from RV failure Hepatic steatosis on 06/05/20 Plan: Monitor  Gout Plan: Foot x-ray clear Prednisone taper  Diabetes Plan: SSI plus Lantus with CBGs  Sickle cell disease, chronic anemia Chronic pain Plan: Tinea hydroxyurea and as needed Dilaudid  Best practice (evaluated daily)  Diet:  Oral Pain/Anxiety/Delirium protocol (if indicated): No VAP protocol (if indicated): Not indicated DVT prophylaxis: LMWH GI prophylaxis: N/A Glucose control:  SSI Yes and Basal insulin Yes Central venous access:  N/A Arterial line:  N/A Foley:  N/A Mobility:  bed rest  PT consulted: N/A Last date of multidisciplinary goals of care  discussion [NA] Code Status:  full code Disposition: ICU  Labs   CBC: Recent Labs  Lab 05/28/20 0221 05/29/20 1925 05/31/20 0351 05/31/20 2130 06/01/20 0500 06/02/20 0413 06/03/20 0024  WBC 12.5*   < > 15.6* 14.6* 14.2* 15.4*  15.7*  NEUTROABS 7.5  --  10.5*  --  9.1* 10.4* 12.4*  HGB 7.4*   < > 6.1* 7.7* 7.4* 8.0* 8.9*  HCT 19.9*   < > 16.5* 20.6* 19.6* 21.7* 23.4*  MCV 122.8*   < > 120.4* 113.2* 112.6* 115.4* 112.5*  PLT 194   < > 179 209 201 225 191   < > = values in this interval not displayed.    Basic Metabolic Panel: Recent Labs  Lab 05/28/20 0221 05/29/20 0615 05/30/20 1717 05/31/20 0835 06/01/20 1017 06/02/20 0413 06/03/20 0024  NA 132*   < > 130* 130* 131* 128* 128*  K 4.9   < > 5.5* 4.7 4.5 4.7 5.3*  CL 107   < > 104 104 104 103 102  CO2 17*   < > 17* 18* 18* 18* 18*  GLUCOSE 170*   < > 275* 224* 144* 148* 382*  BUN 50*   < > 71* 67* 62* 65* 68*  CREATININE 1.63*   < > 2.41* 2.14* 1.84* 1.78* 1.84*  CALCIUM 8.5*   < > 8.1* 8.2* 8.2* 8.3* 8.6*  MG 2.4  --   --   --  2.2  --   --   PHOS 4.5  --   --   --   --   --   --    < > = values in this interval not displayed.   GFR: Estimated Creatinine Clearance: 36.5 mL/min (A) (by C-G formula based on SCr of 1.84 mg/dL (H)). Recent Labs  Lab 05/31/20 2130 06/01/20 0500 06/02/20 0413 06/03/20 0024  WBC 14.6* 14.2* 15.4* 15.7*    Liver Function Tests: Recent Labs  Lab 05/28/20 0221 05/29/20 1129 05/31/20 0351 06/01/20 0500 06/02/20 0413  AST 89* 96* 69* 59* 68*  ALT 85* 82* 67* 37 34  ALKPHOS 106 106 102 105 127*  BILITOT 5.6* 6.4* 5.9* 5.0* 4.8*  PROT 9.5* 8.8* 8.4* 8.6* 8.7*  ALBUMIN 2.8* 2.7* 2.5* 2.5* 2.5*   Recent Labs  Lab 05/27/20 1700  LIPASE 30   No results for input(s): AMMONIA in the last 168 hours.  ABG    Component Value Date/Time   HCO3 17.2 (L) 05/29/2020 1935   TCO2 18 (L) 05/29/2020 1935   ACIDBASEDEF 8.0 (H) 05/29/2020 1935   O2SAT 73.0 05/29/2020 1935     Coagulation Profile: Recent Labs  Lab 05/28/20 0221  INR 1.4*    Cardiac Enzymes: No results for input(s): CKTOTAL, CKMB, CKMBINDEX, TROPONINI in the last 168 hours.  HbA1C: Hgb A1c MFr Bld  Date/Time Value Ref Range Status   05/28/2020 02:21 AM 5.2 4.8 - 5.6 % Final    Comment:    (NOTE)         Prediabetes: 5.7 - 6.4         Diabetes: >6.4         Glycemic control for adults with diabetes: <7.0   08/31/2019 01:52 PM 7.8 (H) 4.8 - 5.6 % Final    Comment:    (NOTE)         Prediabetes: 5.7 - 6.4         Diabetes: >6.4         Glycemic control for adults  with diabetes: <7.0     CBG: Recent Labs  Lab 06/02/20 1611 06/02/20 1930 06/02/20 2340 06/03/20 0313 06/03/20 0755  GLUCAP 243* 293* 365* 299* 219*     Josephine Igo, DO [facility] 06/03/2020 9:06 AM

## 2020-06-03 NOTE — Progress Notes (Signed)
[facility] RE:    Patient HR now SVT 130s. No SOB, pt asymptomatic, VSS.

## 2020-06-03 NOTE — Progress Notes (Signed)
Chief Complaint: Patient was seen in consultation today for port removal  Referring Physician(s): Dr. Gwynn Burly  Supervising Physician: Ruel Favors  Patient Status: [facility] - In-pt  History of Present Illness: Pamela Hudson is a 35 y.o. female with hx of sickle cell as well as heart failure. Has a chronic right port catheter that was placed several years ago at [facility] for poor venous access needs.  Unfortunately she has been admitted with acute on chronic exacerbation of right sided heart failure / pulmonary artery hypertension. She was also found to have fever and leukocytosis and positive blood cultures. ID involved and recommends removal of port. Pt just returned from TEE and is still very sleepy, unable to contribute much to history. PMHx, meds, labs, imaging, allergies reviewed.     Past Medical History:  Diagnosis Date  . Atrial fibrillation (HCC)   . Cardiomegaly   . CHF (congestive heart failure) (HCC)   . Chronic kidney disease (CKD), stage III (moderate) (HCC)   . Chronic pain   . COVID-19   . CVA (cerebral infarction) 02/2002  . Diabetes mellitus without complication (HCC)   . Hypertension   . Liver disease    ?iron  . PCOS (polycystic ovarian syndrome)   . Renal stone   . Sickle cell anemia (HCC)   . Sleep apnea     Past Surgical History:  Procedure Laterality Date  . CHOLECYSTECTOMY    . LIVER BIOPSY  2007  . PARATHYROIDECTOMY    . PORTACATH PLACEMENT    . RIGHT HEART CATH N/A 09/01/2018   Procedure: RIGHT HEART CATH;  Surgeon: Yates Decamp, MD;  Location: [facility] INVASIVE CV LAB;  Service: Cardiovascular;  Laterality: N/A;  . RIGHT HEART CATH N/A 05/29/2020   Procedure: RIGHT HEART CATH;  Surgeon: Dolores Patty, MD;  Location: [facility] INVASIVE CV LAB;  Service: Cardiovascular;  Laterality: N/A;    Allergies: Oxycodone-acetaminophen and Prednisone  Medications:  Current Facility-Administered Medications:  .  0.9 %  sodium chloride infusion, ,  Intravenous, PRN, Mannam, Praveen, MD, Last Rate: 10 mL/hr at 06/03/20 1200, Infusion Verify at 06/03/20 1200 .  acetaminophen (TYLENOL) tablet 650 mg, 650 mg, Oral, Q4H PRN, Bensimhon, Bevelyn Buckles, MD, 650 mg at 06/01/20 2204 .  [COMPLETED] amiodarone (NEXTERONE) 1.8 mg/mL load via infusion 150 mg, 150 mg, Intravenous, Once, 150 mg at 06/03/20 0956 **FOLLOWED BY** amiodarone (NEXTERONE PREMIX) 360-4.14 MG/200ML-% (1.8 mg/mL) IV infusion, 60 mg/hr, Intravenous, Continuous, Last Rate: 33.3 mL/hr at 06/03/20 1500, 60 mg/hr at 06/03/20 1500 **FOLLOWED BY** amiodarone (NEXTERONE PREMIX) 360-4.14 MG/200ML-% (1.8 mg/mL) IV infusion, 30 mg/hr, Intravenous, Continuous, Bensimhon, Daniel R, MD .  ceFAZolin (ANCEF) IVPB 2g/100 mL premix, 2 g, Intravenous, Q8H, Kathlynn Grate, DO, Stopped at 06/03/20 [phone] .  Chlorhexidine Gluconate Cloth 2 % PADS 6 each, 6 each, Topical, Daily, Bensimhon, Bevelyn Buckles, MD, 6 each at 06/03/20 1014 .  enoxaparin (LOVENOX) injection 30 mg, 30 mg, Subcutaneous, Q24H, Bensimhon, Bevelyn Buckles, MD, 30 mg at 06/02/20 0729 .  feeding supplement (ENSURE ENLIVE / ENSURE PLUS) liquid 237 mL, 237 mL, Oral, BID BM, Bensimhon, Bevelyn Buckles, MD, 237 mL at 05/30/20 1012 .  furosemide (LASIX) injection 80 mg, 80 mg, Intravenous, BID, Robbie Lis M, PA-C, Given at 06/02/20 [phone] .  guaiFENesin-dextromethorphan (ROBITUSSIN DM) 100-10 MG/5ML syrup 5 mL, 5 mL, Oral, Q4H PRN, Bensimhon, Bevelyn Buckles, MD, 5 mL at 05/31/20 1840 .  heparin lock flush 100 unit/mL, 250 Units, Intracatheter, PRN, Magrinat, Valentino Hue, MD .  heparin lock flush 100 unit/mL, 500 Units, Intracatheter, Prior to discharge, Icard, Bradley L, DO .  HYDROmorphone (DILAUDID) tablet 2 mg, 2 mg, Oral, Q4H PRN, Icard, Bradley L, DO, 2 mg at 06/02/20 0758 .  hydroxyurea (HYDREA) capsule 1,000 mg, 1,000 mg, Oral, Daily, Bensimhon, Bevelyn Buckles, MD, 1,000 mg at 06/02/20 0926 .  insulin aspart (novoLOG) injection 0-9 Units, 0-9 Units, Subcutaneous, Q4H,  Bensimhon, Bevelyn Buckles, MD, 2 Units at 06/03/20 1602 .  insulin glargine (LANTUS) injection 12 Units, 12 Units, Subcutaneous, QHS, Bensimhon, Bevelyn Buckles, MD, 12 Units at 06/02/20 2149 .  multivitamin with minerals tablet 1 tablet, 1 tablet, Oral, Daily, Bensimhon, Bevelyn Buckles, MD, 1 tablet at 06/03/20 0945 .  ondansetron (ZOFRAN) injection 4 mg, 4 mg, Intravenous, Q6H PRN, Bensimhon, Bevelyn Buckles, MD, 4 mg at 05/29/20 0005 .  ondansetron (ZOFRAN) injection 4 mg, 4 mg, Intravenous, Q6H PRN, Bensimhon, Bevelyn Buckles, MD, 4 mg at 05/31/20 0009 .  predniSONE (DELTASONE) tablet 20 mg, 20 mg, Oral, Q breakfast, 20 mg at 06/03/20 0819 **FOLLOWED BY** [START ON 06/05/2020] predniSONE (DELTASONE) tablet 10 mg, 10 mg, Oral, Q breakfast, Icard, Bradley L, DO .  [START ON 06/07/2020] predniSONE (DELTASONE) tablet 5 mg, 5 mg, Oral, Q breakfast, Icard, Bradley L, DO .  sodium chloride flush (NS) 0.9 % injection 10 mL, 10 mL, Intracatheter, PRN, Magrinat, Valentino Hue, MD .  sodium chloride flush (NS) 0.9 % injection 10-40 mL, 10-40 mL, Intracatheter, Q12H, Bensimhon, Bevelyn Buckles, MD, 10 mL at 06/02/20 2150 .  sodium chloride flush (NS) 0.9 % injection 3 mL, 3 mL, Intracatheter, PRN, Magrinat, Valentino Hue, MD .  sodium zirconium cyclosilicate (LOKELMA) packet 10 g, 10 g, Oral, Once, Sharol Harness, Brittainy M, PA-C    Family History  Problem Relation Age of Onset  . Diabetes Mellitus II Father   . CAD Father   . Diabetes Mellitus II Paternal Grandmother     Social History   Socioeconomic History  . Marital status: Significant Other    Spouse name: Jamar  . Number of children: 1  . Years of education: 4  . Highest education level: Associate degree: occupational, Scientist, product/process development, or vocational program  Occupational History  . Not on file  Tobacco Use  . Smoking status: Former Smoker    Types: Cigarettes  . Smokeless tobacco: Never Used  Vaping Use  . Vaping Use: Never used  Substance and Sexual Activity  . Alcohol use: Not Currently   . Drug use: No  . Sexual activity: Yes    Birth control/protection: Implant  Other Topics Concern  . Not on file  Social History Narrative  . Not on file   Social Determinants of Health   Financial Resource Strain: Low Risk   . Difficulty of Paying Living Expenses: Not very hard  Food Insecurity: No Food Insecurity  . Worried About Programme researcher, broadcasting/film/video in the Last Year: Never true  . Ran Out of Food in the Last Year: Never true  Transportation Needs: No Transportation Needs  . Lack of Transportation (Medical): No  . Lack of Transportation (Non-Medical): No  Physical Activity: Insufficiently Active  . Days of Exercise per Week: 2 days  . Minutes of Exercise per Session: 30 min  Stress: Not on file  Social Connections: Unknown  . Frequency of Communication with Friends and Family: Not on file  . Frequency of Social Gatherings with Friends and Family: Twice a week  . Attends Religious Services: 1 to 4 times per year  .  Active Member of Clubs or Organizations: No  . Attends Banker Meetings: Never  . Marital Status: Living with partner    Review of Systems: A 12 point ROS discussed and pertinent positives are indicated in the HPI above.  All other systems are negative.  Review of Systems  Vital Signs: BP 99/72   Pulse (!) 55   Temp 98.3 F (36.8 C) (Oral)   Resp (!) 30   Ht  (1.499 m)   Wt 67 kg   SpO2 98%   BMI 29.83 kg/m   Physical Exam Constitutional:      Appearance: Normal appearance. She is not ill-appearing.  HENT:     Mouth/Throat:     Mouth: Mucous membranes are moist.     Pharynx: Oropharynx is clear.  Cardiovascular:     Rate and Rhythm: Normal rate and regular rhythm.     Heart sounds: Normal heart sounds.  Pulmonary:     Effort: Pulmonary effort is normal. No respiratory distress.     Breath sounds: Normal breath sounds.  Skin:    Comments: (R)upper chest dual-port palpable beneath skin.   Neurological:     Mental Status: She  is alert.     Comments: Somnolent, arouses to questions but quickly back to sleep/     Imaging: US Abdomen Complete  Result Date: 05/28/2020 CLINICAL DATA:  Elevated liver function tests, chronic renal insufficiency EXAM: ABDOMEN ULTRASOUND COMPLETE COMPARISON:  04/17/2020 FINDINGS: Gallbladder: Surgically absent Common bile duct: Diameter: 4 mm Liver: Stable heterogeneous increased liver echotexture most consistent with hepatic steatosis. No focal liver abnormality. No intrahepatic duct dilation. Portal vein is patent on color Doppler imaging with normal direction of blood flow towards the liver. IVC: No abnormality visualized. Pancreas: Visualized portion unremarkable. Spleen: Suboptimally visualized due to splenic atrophy noted on previous CT exams. Right Kidney: Length: 11.7 cm. Echogenicity within normal limits. No mass or hydronephrosis visualized. Left Kidney: Length: 10.3 cm. Echogenicity within normal limits. No mass or hydronephrosis visualized. Abdominal aorta: No aneurysm visualized. Other findings: None. IMPRESSION: 1. Increased liver echotexture compatible with hepatic steatosis. 2. Status post cholecystectomy. 3. 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 Electronically Signed   By: Sharlet Salina M.D.   On: 05/28/2020 02:18   CARDIAC CATHETERIZATION  Result Date: 05/29/2020 Findings: RA = 23 RV = 34/20 PA = 35/14 (26) PCW = 16 Fick cardiac output/index = 9.1/5.8 Thermo CO/CI = 6.9/4.3 PVR = 1.1 (Fick) 1.5 (Thermo) Ao sat = 96% PA sat = 66%, 68% High SVC sat = 73% PAPi = 0.9 Assessment: 1. Very mild PAH 2. Severe RV failure however cardiac output preserved 3. No evidence of intracardiac shunt Plan/Discussion: Suspect she has near end-stage RV failure due to longstanding PAH. Continue medical therapy. Given normal cardiac output, not sure there is any role for inotropic support at this time. Arvilla Meres, MD 7:49 PM   DG Chest  Portable 1 View  Result Date: 05/27/2020 CLINICAL DATA:  Weakness and shortness of breath. EXAM: PORTABLE CHEST 1 VIEW COMPARISON:  08/30/2019 FINDINGS: Right chest wall port a catheter is noted with tip in the SVC. Cardiac enlargement, unchanged. New right pleural effusion with overlying subsegmental atelectasis. Pulmonary vascular congestion. The visualized osseous structures are unremarkable. IMPRESSION: 1. Cardiac enlargement, pulmonary vascular congestion, and new right pleural effusion with overlying subsegmental atelectasis. Findings concerning for CHF. Electronically Signed   By: Signa Kell M.D.   On: 05/27/2020 17:04  DG Foot 2 Views Left  Result Date: 06/02/2020 CLINICAL DATA:  Pain EXAM: LEFT FOOT - 2 VIEW COMPARISON:  August 20, 2019 FINDINGS: Frontal and lateral views were obtained. No fracture or dislocation. Joint spaces appear normal. No erosive change. There is a small inferior calcaneal spur. IMPRESSION: No fracture or dislocation. No evident arthropathy. There is a small inferior calcaneal spur. Electronically Signed   By: Bretta Bang III M.D.   On: 06/02/2020 12:12   ECHOCARDIOGRAM COMPLETE  Result Date: 05/28/2020    ECHOCARDIOGRAM REPORT   Patient Name:   PRANAVI AURE Date of Exam: 05/28/2020 Medical Rec #:  [id number]     Height:       59.0 in Accession #:    [id number]    Weight:       138.7 lb Date of Birth:  1986-01-09     BSA:          1.579 m Patient Age:    34 years      BP:           101/64 mmHg Patient Gender: F             HR:           108 bpm. Exam Location:  Inpatient Procedure: 2D Echo, 3D Echo, Cardiac Doppler and Color Doppler Indications:    I51.7 Cardiomegaly  History:        Patient has prior history of Echocardiogram examinations, most                 recent 08/31/2018. Abnormal ECG, Stroke and Pulmonary HTN; Risk                 Factors:Hypertension and Diabetes. Sickle cell.  Sonographer:    Sheralyn Boatman RDCS Referring Phys: [id number] ANASTASSIA DOUTOVA IMPRESSIONS   1. 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 The leaflets are fixed and appear immobile. Leaflet immobility is out of proportion to annular dilation. Would consider primary leaflet pathology such as carcinoid involvement in the work-up in addition to the patient's long history of severe pulmonary hypertension. The tricuspid valve is abnormal. Tricuspid valve regurgitation is severe.  2. RVSP is severely underestimated in the setting of severe tricuspid regurgitation. Right ventricular systolic function is severely reduced. The right ventricular size is severely enlarged. There is normal pulmonary artery systolic pressure. The estimated right ventricular systolic pressure is 34.5 mmHg.  3. Left ventricular ejection fraction, by estimation, is 45 to 50%. Left ventricular ejection fraction by 3D volume is 45 %. The left ventricle has mildly decreased function. The left ventricle demonstrates global hypokinesis. Indeterminate diastolic filling due to E-A fusion. There is the interventricular septum is flattened in systole and diastole, consistent with right ventricular pressure and volume overload.  4. Right atrial size was severely dilated.  5. A small pericardial effusion is present. The pericardial effusion is circumferential.  6. The mitral valve is grossly normal. Mild to moderate mitral valve regurgitation. No evidence of mitral stenosis.  7. The aortic valve is tricuspid. Aortic valve regurgitation is not visualized. No aortic stenosis is present.  8. The inferior vena cava is dilated in size with <50% respiratory variability, suggesting right atrial pressure of 15 mmHg. Comparison(s): Changes from prior study are noted. RV is severely dilated with severely reduced function. Severe torrential TR is present. LVEF is mildly reduced 45-50%. RVSP underestimated in setting of severe TR. FINDINGS  Left Ventricle: Left ventricular ejection fraction, by estimation, is 45 to 50%. Left ventricular  ejection  fraction by 3D volume is 45 %. The left ventricle has mildly decreased function. The left ventricle demonstrates global hypokinesis. The left ventricular internal cavity size was normal in size. There is no left ventricular hypertrophy. The interventricular septum is flattened in systole and diastole, consistent with right ventricular pressure and volume overload. Indeterminate diastolic filling due to E-A fusion. Right Ventricle: RVSP is severely underestimated in the setting of severe tricuspid regurgitation. The right ventricular size is severely enlarged. No increase in right ventricular wall thickness. Right ventricular systolic function is severely reduced. There is normal pulmonary artery systolic pressure. The tricuspid regurgitant velocity is 2.21 m/s, and with an assumed right atrial pressure of 15 mmHg, the estimated right ventricular systolic pressure is 34.5 mmHg. Left Atrium: Left atrial size was normal in size. Right Atrium: Right atrial size was severely dilated. Pericardium: A small pericardial effusion is present. The pericardial effusion is circumferential. Mitral Valve: The mitral valve is grossly normal. Mild to moderate mitral valve regurgitation. No evidence of mitral valve stenosis. MV peak gradient, 14.0 mmHg. The mean mitral valve gradient is 4.0 mmHg. Tricuspid Valve: There is severe, torrential tricuspid regurgitation. The leaflets are fixed and appear immobile. Leaflet immobility is out of proportion to annular dilation. Would consider primary leaflet pathology such as carcinoid involvement in the work-up in addition to the patient's long history of severe pulmonary hypertension. The tricuspid valve is abnormal. Tricuspid valve regurgitation is severe. No evidence of tricuspid stenosis. The flow in the hepatic veins is reversed during ventricular systole. Aortic Valve: The aortic valve is tricuspid. Aortic valve regurgitation is not visualized. No aortic stenosis is present. Pulmonic  Valve: The pulmonic valve was grossly normal. Pulmonic valve regurgitation is mild. No evidence of pulmonic stenosis. Aorta: The aortic root and ascending aorta are structurally normal, with no evidence of dilitation. Venous: The inferior vena cava is dilated in size with less than 50% respiratory variability, suggesting right atrial pressure of 15 mmHg. IAS/Shunts: There is left bowing of the interatrial septum, suggestive of elevated right atrial pressure. The atrial septum is grossly normal.  LEFT VENTRICLE PLAX 2D LVIDd:         4.80 cm         Diastology LVIDs:         3.60 cm         LV e' medial:    9.25 cm/s LV PW:         1.15 cm         LV E/e' medial:  14.9 LV IVS:        0.90 cm         LV e' lateral:   11.40 cm/s LVOT diam:     1.80 cm         LV E/e' lateral: 12.1 LV SV:         56 LV SV Index:   36 LVOT Area:     2.54 cm        3D Volume EF                                LV 3D EF:    Left                                             ventricular  LV Volumes (MOD)                            ejection LV vol d, MOD    77.2 ml                    fraction by A2C:                                        3D volume LV vol d, MOD    98.4 ml                    is 45 %. A4C: LV vol s, MOD    36.1 ml A2C:                           3D Volume EF: LV vol s, MOD    59.1 ml       3D EF:        45 % A4C: LV SV MOD A2C:   41.1 ml LV SV MOD A4C:   98.4 ml LV SV MOD BP:    43.2 ml RIGHT VENTRICLE             IVC RV S prime:     10.90 cm/s  IVC diam: 3.00 cm TAPSE (M-mode): 1.9 cm LEFT ATRIUM           Index       RIGHT ATRIUM           Index LA diam:      4.00 cm 2.53 cm/m  RA Area:     25.80 cm LA Vol (A2C): 56.3 ml 35.66 ml/m RA Volume:   102.00 ml 64.61 ml/m LA Vol (A4C): 39.5 ml 25.02 ml/m  AORTIC VALVE              PULMONIC VALVE LVOT Vmax:   134.00 cm/s  PR End Diast Vel: 2.37 msec LVOT Vmean:  104.000 cm/s LVOT VTI:    0.222 m  AORTA Ao Root diam: 2.70 cm Ao Asc diam:  3.30 cm MITRAL VALVE                  TRICUSPID VALVE MV Area (PHT): 5.84 cm      TR Peak grad:   19.5 mmHg MV Area VTI:   1.70 cm      TR Vmax:        221.00 cm/s MV Peak grad:  14.0 mmHg MV Mean grad:  4.0 mmHg      SHUNTS MV Vmax:       1.87 m/s      Systemic VTI:  0.22 m MV Vmean:      81.7 cm/s     Systemic Diam: 1.80 cm MV Decel Time: 130 msec MR Peak grad:    83.7 mmHg MR Mean grad:    56.0 mmHg MR Vmax:         457.50 cm/s MR Vmean:        356.5 cm/s MR PISA:         1.01 cm MR PISA Eff ROA: 9 mm MR PISA Radius:  0.40 cm MV E velocity: 138.00 cm/s Lennie Odor MD Electronically signed by Lennie Odor MD Signature Date/Time: 05/28/2020/2:05:27 PM    Final     Labs:  CBC: Recent Labs    05/31/20 2130 06/01/20 0500 06/02/20 0413 06/03/20 0024  WBC 14.6* 14.2* 15.4* 15.7*  HGB 7.7* 7.4* 8.0* 8.9*  HCT 20.6* 19.6* 21.7* 23.4*  PLT 209 201 225 191    COAGS: Recent Labs    09/02/19 1841 05/28/20 0221  INR 1.3* 1.4*  APTT 65*  --     BMP: Recent Labs    08/31/19 1352 09/02/19 0702 09/03/19 1407 09/04/19 0537 05/27/20 1700 06/01/20 1017 06/02/20 0413 06/03/20 0024 06/03/20 1451  NA 134* 133* 134* 134*   < > 131* 128* 128* 130*  K 4.4 4.3 5.0 4.9   < > 4.5 4.7 5.3* 5.5*  CL 101 100 101 103   < > 104 103 102 104  CO2 26 26 24 24    < > 18* 18* 18* 19*  GLUCOSE 417* 187* 173* 61*   < > 144* 148* 382* 213*  BUN 54* 35* 25* 28*   < > 62* 65* 68* 67*  CALCIUM 8.7* 8.9 9.0 8.7*   < > 8.2* 8.3* 8.6* 8.8*  CREATININE 1.67* 1.40* 1.31* 1.47*   < > 1.84* 1.78* 1.84* 1.62*  GFRNONAA 40* 50* 54* 47*   < > 37* 38* 37* 43*  GFRAA 46* 57* >60 54*  --   --   --   --   --    < > = values in this interval not displayed.    LIVER FUNCTION TESTS: Recent Labs    05/29/20 1129 05/31/20 0351 06/01/20 0500 06/02/20 0413  BILITOT 6.4* 5.9* 5.0* 4.8*  AST 96* 69* 59* 68*  ALT 82* 67* 37 34  ALKPHOS 106 102 105 127*  PROT 8.8* 8.4* 8.6* 8.7*  ALBUMIN 2.7* 2.5* 2.5* 2.5*    TUMOR MARKERS: No results for  input(s): AFPTM, CEA, CA199, CHROMGRNA in the last 8760 hours.  Assessment and Plan: Bacteremia Hx of chronic right sided dual-port for poor venous access. Sickle cell. ID request removal. Tried to discuss with pt plan for procedure tomorrow. Pt able to follow some commands but with recent sedation from TEE, will need to further discuss tomorrow and consent. Risks and benefits of image guided port-a-catheter removal was discussed with the patient including, but not limited to bleeding, infection, pneumothorax, or fibrin sheath development and need for additional procedures.     Thank you for this interesting consult.  I greatly enjoyed meeting 06/04/20 and look forward to participating in their care.  A copy of this report was sent to the requesting provider on this date.  Electronically Signed: [address], PA-C 06/03/2020, 4:28 PM   I spent a total of 10 minutes in face to face in clinical consultation, greater than 50% of which was counseling/coordinating care for port removal

## 2020-06-03 NOTE — Progress Notes (Signed)
PCCM:  Patient has been accepted for pick up by Dr. Marylu Lund with [facility] for 06/04/2020.  Josephine Igo, DO [facility] 06/03/2020 9:30 AM

## 2020-06-03 NOTE — Progress Notes (Signed)
I have patient scheduled for TEE today at 3pm in Endo.   Arvilla Meres, MD  8:20 AM

## 2020-06-03 NOTE — CV Procedure (Signed)
    TRANSESOPHAGEAL ECHOCARDIOGRAM   NAME:  Pamela Hudson   MRN: [id number] DOB:  September 03, 1985   ADMIT DATE: 05/27/2020  INDICATIONS:  Bacteremia  PROCEDURE:   Informed consent was obtained prior to the procedure. The risks, benefits and alternatives for the procedure were discussed and the patient comprehended these risks.  Risks include, but are not limited to, cough, sore throat, vomiting, nausea, somnolence, esophageal and stomach trauma or perforation, bleeding, low blood pressure, aspiration, pneumonia, infection, trauma to the teeth and death.    After a procedural time-out, the patient was given 6 mg versed and 50 mcg fentanyl for moderate sedation.  The oropharynx was anesthetized 10 cc of topical 1% viscous lidocaine.  The transesophageal probe was inserted in the esophagus and stomach without difficulty and multiple views were obtained.    COMPLICATIONS:    There were no immediate complications.  FINDINGS:  LEFT VENTRICLE: EF = 30-35%. No regional wall motion abnormalities.  RIGHT VENTRICLE: Moderately dilated, moderate to severe HK  LEFT ATRIUM: Normal  LEFT ATRIAL APPENDAGE: No thrombus.   RIGHT ATRIUM: Markedly dialted  AORTIC VALVE:  Trileaflet.  No AI/AS. No vegetation   MITRAL VALVE:    Normal. Trivial MR. No vegetation  TRICUSPID VALVE: Markedly abnormal with thickened and restricted leaflets. No coaptation. Severe TR. No vegetation  PULMONIC VALVE: Normal. Mild PI. No vegetation  INTERATRIAL SEPTUM: No PFO or ASD.  PERICARDIUM: Small effusion along RA  DESCENDING AORTA: Not well visualized   CONCLUSION:  No TEE evidence of endocarditis.   Truman Hayward 2:39 PM

## 2020-06-03 NOTE — Evaluation (Signed)
Physical Therapy Evaluation Patient Details Name: Pamela Hudson MRN: [id number] DOB: [birth year] Today's Date: 06/03/2020   History of Present Illness  35 y.o. female presenting to [facility] ED on 05/27/2020 with LE swelling and SOB, concerned for volume overload with CHf exacerbation. Gout flare acutely. 3/20 SVT. PMH includes cardiomegaly, CHF, chronic pain, CVA, HTN, PCOS, sickle cell anemia, DM2, chronic right heart strain, pulmonary Hypertension, CKD stage 3, HTN, transient A.fib COVID 2020.  Clinical Impression  Pt pleasant and reports decreased mobility acutely since gout flare with inability to walk and only hopping limited distance in room. Pt normally independent and was even walking in hall and stairs with P.T. on 3/16. Pt with decreased gait and activity tolerance limited by pain with education for RW use and progression. Pt will benefit from acute therapy to maximize mobility and independence as gout clears. Encouraged OOB and walking with RW with nursing staff daily.   HR 105 SpO2 99% on RA    Follow Up Recommendations No PT follow up    Equipment Recommendations  None recommended by PT    Recommendations for Other Services       Precautions / Restrictions Precautions Precautions: Fall Precaution Comments: left great toe gout pain      Mobility  Bed Mobility Overal bed mobility: Independent                  Transfers Overall transfer level: Needs assistance   Transfers: Sit to/from Stand Sit to Stand: Supervision         General transfer comment: cues for hand placement with RW present  Ambulation/Gait Ambulation/Gait assistance: Supervision Gait Distance (Feet): 140 Feet Assistive device: Rolling walker (2 wheeled) Gait Pattern/deviations: Step-to pattern;Decreased stride length   Gait velocity interpretation: 1.31 - 2.62 ft/sec, indicative of limited community ambulator General Gait Details: cues for proximity to RW and sequence  Optometrist    Modified Rankin (Stroke Patients Only)       Balance Overall balance assessment: Mild deficits observed, not formally tested                                           Pertinent Vitals/Pain Pain Assessment: 0-10 Pain Score: 5  Pain Location: left great toe Pain Descriptors / Indicators: Aching;Guarding;Constant Pain Intervention(s): Limited activity within patient's tolerance;Monitored during session;Repositioned    Home Living Family/patient expects to be discharged to:: Private residence Living Arrangements: Spouse/significant other;Children Available Help at Discharge: Family;Available PRN/intermittently Type of Home: Apartment Home Access: Stairs to enter   Entrance Stairs-Number of Steps: 2 flights Home Layout: One level Home Equipment: Crutches;Walker - 2 wheels;Bedside commode      Prior Function Level of Independence: Independent         Comments: pt reports she does not work and has been assisting with caring for mother who has MS and just moved to SNF     Hand Dominance        Extremity/Trunk Assessment   Upper Extremity Assessment Upper Extremity Assessment: Overall WFL for tasks assessed    Lower Extremity Assessment Lower Extremity Assessment: Overall WFL for tasks assessed;LLE deficits/detail LLE Deficits / Details: stance limited by gout pain    Cervical / Trunk Assessment Cervical / Trunk Assessment: Normal  Communication   Communication: No difficulties  Cognition Arousal/Alertness: Awake/alert Behavior During  Therapy: WFL for tasks assessed/performed Overall Cognitive Status: Within Functional Limits for tasks assessed                                        General Comments      Exercises     Assessment/Plan    PT Assessment Patient needs continued PT services  PT Problem List Decreased mobility;Decreased activity tolerance;Decreased knowledge of use of DME;Pain        PT Treatment Interventions DME instruction;Gait training;Stair training;Functional mobility training;Therapeutic activities;Patient/family education    PT Goals (Current goals can be found in the Care Plan section)  Acute Rehab PT Goals Patient Stated Goal: return home PT Goal Formulation: With patient Time For Goal Achievement: 06/17/20 Potential to Achieve Goals: Good    Frequency Min 3X/week   Barriers to discharge        Co-evaluation               AM-PAC PT "6 Clicks" Mobility  Outcome Measure Help needed turning from your back to your side while in a flat bed without using bedrails?: None Help needed moving from lying on your back to sitting on the side of a flat bed without using bedrails?: None Help needed moving to and from a bed to a chair (including a wheelchair)?: A Little Help needed standing up from a chair using your arms (e.g., wheelchair or bedside chair)?: A Little Help needed to walk in hospital room?: A Little Help needed climbing 3-5 steps with a railing? : A Little 6 Click Score: 20    End of Session   Activity Tolerance: Patient tolerated treatment well Patient left: in chair;with call bell/phone within reach Nurse Communication: Mobility status PT Visit Diagnosis: Other abnormalities of gait and mobility (R26.89);Pain Pain - Right/Left: Left Pain - part of body: Ankle and joints of foot    Time: 1275-1700 PT Time Calculation (min) (ACUTE ONLY): 16 min   Charges:   PT Evaluation $PT Eval Moderate Complexity: 1 Mod          Pamela Hudson P, PT Acute Rehabilitation Services Pager: [phone] Office: [phone]   Pamela Hudson Pamela Hudson 06/03/2020, 8:56 AM

## 2020-06-04 ENCOUNTER — Inpatient Hospital Stay (HOSPITAL_COMMUNITY): Payer: Medicaid Other

## 2020-06-04 DIAGNOSIS — I509 Heart failure, unspecified: Secondary | ICD-10-CM | POA: Diagnosis not present

## 2020-06-04 DIAGNOSIS — I50813 Acute on chronic right heart failure: Secondary | ICD-10-CM | POA: Diagnosis not present

## 2020-06-04 DIAGNOSIS — R7881 Bacteremia: Secondary | ICD-10-CM | POA: Diagnosis not present

## 2020-06-04 DIAGNOSIS — D72829 Elevated white blood cell count, unspecified: Secondary | ICD-10-CM | POA: Diagnosis not present

## 2020-06-04 HISTORY — PX: IR REMOVAL TUN ACCESS W/ PORT W/O FL MOD SED: IMG2290

## 2020-06-04 LAB — CBC WITH DIFFERENTIAL/PLATELET
Abs Immature Granulocytes: 0.2 10*3/uL — ABNORMAL HIGH (ref 0.00–0.07)
Basophils Absolute: 0.2 10*3/uL — ABNORMAL HIGH (ref 0.0–0.1)
Basophils Relative: 1 %
Eosinophils Absolute: 0 10*3/uL (ref 0.0–0.5)
Eosinophils Relative: 0 %
HCT: 23.2 % — ABNORMAL LOW (ref 36.0–46.0)
Hemoglobin: 8.5 g/dL — ABNORMAL LOW (ref 12.0–15.0)
Lymphocytes Relative: 12 %
Lymphs Abs: 2 10*3/uL (ref 0.7–4.0)
MCH: 42.1 pg — ABNORMAL HIGH (ref 26.0–34.0)
MCHC: 36.6 g/dL — ABNORMAL HIGH (ref 30.0–36.0)
MCV: 114.9 fL — ABNORMAL HIGH (ref 80.0–100.0)
Monocytes Absolute: 1.4 10*3/uL — ABNORMAL HIGH (ref 0.1–1.0)
Monocytes Relative: 8 %
Neutro Abs: 13.2 10*3/uL — ABNORMAL HIGH (ref 1.7–7.7)
Neutrophils Relative %: 78 %
Platelets: 275 10*3/uL (ref 150–400)
Promyelocytes Relative: 1 %
RBC: 2.02 MIL/uL — ABNORMAL LOW (ref 3.87–5.11)
RDW: 21.1 % — ABNORMAL HIGH (ref 11.5–15.5)
WBC: 16.9 10*3/uL — ABNORMAL HIGH (ref 4.0–10.5)
nRBC: 11 % — ABNORMAL HIGH (ref 0.0–0.2)
nRBC: 9 /100 WBC — ABNORMAL HIGH

## 2020-06-04 LAB — BASIC METABOLIC PANEL
Anion gap: 9 (ref 5–15)
Anion gap: 9 (ref 5–15)
BUN: 72 mg/dL — ABNORMAL HIGH (ref 6–20)
BUN: 72 mg/dL — ABNORMAL HIGH (ref 6–20)
CO2: 16 mmol/L — ABNORMAL LOW (ref 22–32)
CO2: 17 mmol/L — ABNORMAL LOW (ref 22–32)
Calcium: 8.6 mg/dL — ABNORMAL LOW (ref 8.9–10.3)
Calcium: 8.6 mg/dL — ABNORMAL LOW (ref 8.9–10.3)
Chloride: 102 mmol/L (ref 98–111)
Chloride: 104 mmol/L (ref 98–111)
Creatinine, Ser: 1.82 mg/dL — ABNORMAL HIGH (ref 0.44–1.00)
Creatinine, Ser: 1.8 mg/dL — ABNORMAL HIGH (ref 0.44–1.00)
GFR, Estimated: 37 mL/min — ABNORMAL LOW (ref >60)
GFR, Estimated: 38 mL/min — ABNORMAL LOW (ref >60)
Glucose, Bld: 378 mg/dL — ABNORMAL HIGH (ref 70–99)
Glucose, Bld: 152 mg/dL — ABNORMAL HIGH (ref 70–99)
Potassium: 5.6 mmol/L — ABNORMAL HIGH (ref 3.5–5.1)
Potassium: 5 mmol/L (ref 3.5–5.1)
Sodium: 127 mmol/L — ABNORMAL LOW (ref 135–145)
Sodium: 130 mmol/L — ABNORMAL LOW (ref 135–145)

## 2020-06-04 LAB — GLUCOSE, CAPILLARY
Glucose-Capillary: 246 mg/dL — ABNORMAL HIGH (ref 70–99)
Glucose-Capillary: 184 mg/dL — ABNORMAL HIGH (ref 70–99)
Glucose-Capillary: 137 mg/dL — ABNORMAL HIGH (ref 70–99)
Glucose-Capillary: 195 mg/dL — ABNORMAL HIGH (ref 70–99)
Glucose-Capillary: 390 mg/dL — ABNORMAL HIGH (ref 70–99)
Glucose-Capillary: 449 mg/dL — ABNORMAL HIGH (ref 70–99)

## 2020-06-04 MED ORDER — MIDAZOLAM HCL 2 MG/2ML IJ SOLN
INTRAMUSCULAR | Status: AC | PRN
  Administered 2020-06-04 (×2): 1 mg via INTRAVENOUS

## 2020-06-04 MED ORDER — INSULIN ASPART 100 UNIT/ML ~~LOC~~ SOLN
8.0000 [IU] | Freq: Once | SUBCUTANEOUS | Status: AC
  Administered 2020-06-04: 8 [IU] via SUBCUTANEOUS

## 2020-06-04 MED ORDER — SODIUM ZIRCONIUM CYCLOSILICATE 10 G PO PACK
10.0000 g | PACK | Freq: Once | ORAL | Status: AC
  Administered 2020-06-04: 10 g via ORAL
  Filled 2020-06-04: qty 1

## 2020-06-04 MED ORDER — MIDAZOLAM HCL 2 MG/2ML IJ SOLN
INTRAMUSCULAR | Status: AC
  Filled 2020-06-04: qty 4

## 2020-06-04 MED ORDER — CEFAZOLIN SODIUM-DEXTROSE 2-4 GM/100ML-% IV SOLN
INTRAVENOUS | Status: AC
  Filled 2020-06-04: qty 100

## 2020-06-04 MED ORDER — LIDOCAINE HCL 1 % IJ SOLN
INTRAMUSCULAR | Status: AC
  Filled 2020-06-04: qty 20

## 2020-06-04 MED ORDER — INSULIN ASPART 100 UNIT/ML ~~LOC~~ SOLN
0.0000 [IU] | Freq: Every day | SUBCUTANEOUS | Status: DC
  Administered 2020-06-05 – 2020-06-09 (×2): 2 [IU] via SUBCUTANEOUS

## 2020-06-04 MED ORDER — INSULIN GLARGINE 100 UNIT/ML ~~LOC~~ SOLN
20.0000 [IU] | Freq: Every day | SUBCUTANEOUS | Status: DC
  Administered 2020-06-04: 20 [IU] via SUBCUTANEOUS
  Filled 2020-06-04 (×2): qty 0.2

## 2020-06-04 MED ORDER — INSULIN ASPART 100 UNIT/ML ~~LOC~~ SOLN
0.0000 [IU] | Freq: Three times a day (TID) | SUBCUTANEOUS | Status: DC
  Administered 2020-06-04: 3 [IU] via SUBCUTANEOUS
  Administered 2020-06-05: 8 [IU] via SUBCUTANEOUS
  Administered 2020-06-05 (×2): 5 [IU] via SUBCUTANEOUS
  Administered 2020-06-06: 3 [IU] via SUBCUTANEOUS
  Administered 2020-06-06: 5 [IU] via SUBCUTANEOUS
  Administered 2020-06-06 – 2020-06-08 (×5): 3 [IU] via SUBCUTANEOUS
  Administered 2020-06-08: 2 [IU] via SUBCUTANEOUS
  Administered 2020-06-09: 5 [IU] via SUBCUTANEOUS
  Administered 2020-06-09: 3 [IU] via SUBCUTANEOUS
  Administered 2020-06-10: 5 [IU] via SUBCUTANEOUS
  Administered 2020-06-10: 2 [IU] via SUBCUTANEOUS
  Administered 2020-06-10: 5 [IU] via SUBCUTANEOUS
  Administered 2020-06-11 (×2): 3 [IU] via SUBCUTANEOUS

## 2020-06-04 NOTE — Progress Notes (Signed)
PROGRESS NOTE    Pamela GongDevon Cones  WUJ:[id number] DOB: 01/28/1986 DOA: 05/27/2020 PCP: Judd LienWebb, Meghan H, PA-C    Brief Narrative:  Pamela Hudson is a 35 year old female with past medical history significant for sickle cell anemia, pulmonary hypertension, CKD stage IIIb, type 2 diabetes mellitus, CVA, PCOS, Hx COVID 2020 who presented to the ED with progressive shortness of breath, lower extremity edema reported 5 pound weight gain over the previous 3 days.  Patient was admitted for acute on chronic systolic/diastolic congestive heart failure exacerbation with associated pulmonary hypertension and was found to have staph capitis bacteremia.  Patient underwent right heart catheterization with findings of mild PAH, severe RV failure with preserved cardiac output with no evidence of intracardiac shunt.  On 05/30/2020 was started on a milrinone drip for assistance with aggressive diuresis per cardiology.  On 06/01/2020, patient developed SVT while on milrinone drip and was transreferred to the intensive care unit.  Milrinone drip was discontinued and patient was started on amiodarone infusion.  [facility].   Assessment & Plan:   Principal Problem:   Bacteremia Active Problems:   Sickle cell anemia (HCC)   Elevated bilirubin   Leukocytosis   Dyspnea   Acute on chronic right-sided heart failure (HCC)   Chronic right-sided heart failure (HCC)   Pulmonary hypertension (HCC)   CKD (chronic kidney disease)   DM2 (diabetes mellitus, type 2) (HCC)   SVT (supraventricular tachycardia) (HCC)   Biventricular heart failure Severe tricuspid regurgitation with core pulmonary SVT Patient presenting to the ED with progressive shortness of breath and lower extremity edema. TTE with LVEF 45-50%, RV severely reduced, RVSP 34.  Underwent right heart catheterization on 05/29/2020 with mild PAH but with severe RV failure.  Patient was started on IV  diuresis with milrinone drip support, but subsequently developed SVT in which she was transferred to the intensive care unit.  Milrinone was discontinued and patient was started on amiodarone drip.  That she is close to end-stage disease. --Cardiology/heart failure team following, appreciate assistance --Amiodarone drip --IV Lasix now on hold due to rising BUN/creatinine --Strict I's and O's and daily weights  Staph capitis bacteremia Blood cultures from 05/28/2020 + for Staphylococcus capitis.  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  Patient was initially started on Ancef, followed by vancomycin.  Now deescalated back to Ancef based on culture identification and susceptibilities.  Repeat blood cultures 05/29/2020 with no growth x5 days.  TEE 3/22 with no signs of endocarditis. --Infectious disease following, appreciate assistance --IR for port removal today --Recommends repeat blood cultures following port removal --Continue Ancef  Acute renal failure on CKD stage IIIb Creatinine on admission 1.49, trended up to a high of 2.53 on 05/30/2020; likely secondary to aggressive IV diuresis. 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 --CMP in am  Gout Left foot x-ray with no fracture or dislocation, no evident arthropathy with small inferior calcaneal spur. --Continue prednisone taper; complete on 06/07/2020  Type 2 diabetes mellitus The 5.2 on 05/28/2020, well controlled.  Home regimen includes Jardiance 25 mg p.o. twice daily, Lantus 30 units subcutaneously nightly, NovoLog 5 units 3 times daily AC, Metformin 1000 mg p.o. twice daily --Increase Lantus to 20 units subcutaneously daily --Moderate dose SSI for further coverage --CBG's every 4 hours until post procedure  Sickle cell anemia Chronic pain --Hydroxyurea  daily --Dilaudid  grams p.o. every 4 hours as needed severe pain   DVT prophylaxis: Heparin   Code Status: Full Code Family Communication: No family present at bedside this morning  Disposition Plan:  Level of care: ICU Status is: Inpatient  Remains inpatient appropriate because:Ongoing diagnostic testing needed not appropriate for outpatient work up, Unsafe d/c plan, IV treatments appropriate due to intensity of illness or inability to take PO and Inpatient level of care appropriate due to severity of illness   Dispo: The patient is from: Home              Anticipated d/c is to: Home              Patient currently is not medically stable to d/c.   Difficult to place patient No  Consultants:   PCCM  Infectious disease  Cardiology/heart failure team  Procedures:   TEE  Antimicrobials:   Vancomycin 3/19 - 3/21  Ancef 3/18 - 3/19, 3/21   Subjective: Patient seen and examined bedside, resting comfortably.  No specific complaints this morning.  Awaiting for port removal later today.  Continues with mild toe pain but improved.  Denies headache, no fever/chills/night sweats, no nausea/vomiting/diarrhea, no chest pain, no palpitations, no abdominal pain.  No acute events overnight per nursing staff.  Objective: Vitals:   06/04/20 0500 06/04/20 0600 06/04/20 0700 06/04/20 0826  BP: 99/66 99/66 103/66   Pulse: 93 90 91   Resp: 12 (!) 25 (!) 24   Temp:    98.4 F (36.9 C)  TempSrc:    Oral  SpO2: 98% 96% 99%   Weight:      Height:        Intake/Output Summary (Last 24 hours) at 06/04/2020 1148 Last data filed at 06/04/2020 0700 Gross per 24 hour  Intake 870.14 ml  Output 500 ml  Net 370.14 ml   Filed Weights   06/02/20 0600 06/03/20 0500 06/04/20 0118  Weight: 66.5 kg 67 kg 67.1 kg    Examination:  General exam: Appears calm and comfortable  Respiratory system: Clear to auscultation. Respiratory effort normal.  On room air with SPO2 96% Cardiovascular system: S1 & S2 heard,  RRR. No JVD, murmurs, rubs, gallops or clicks. No pedal edema. Gastrointestinal system: Abdomen is nondistended, soft and nontender. No organomegaly or masses felt. Normal bowel sounds heard. Central nervous system: Alert and oriented. No focal neurological deficits. Extremities: Symmetric 5 x 5 power. Skin: No rashes, lesions or ulcers Psychiatry: Judgement and insight appear normal. Mood & affect appropriate.     Data Reviewed: I have personally reviewed following labs and imaging studies  CBC: Recent Labs  Lab 05/31/20 0351 05/31/20 2130 06/01/20 0500 06/02/20 0413 06/03/20 0024 06/04/20 0104  WBC 15.6* 14.6* 14.2* 15.4* 15.7* 16.9*  NEUTROABS 10.5*  --  9.1* 10.4* 12.4* 13.2*  HGB 6.1* 7.7* 7.4* 8.0* 8.9* 8.5*  HCT 16.5* 20.6* 19.6* 21.7* 23.4* 23.2*  MCV 120.4* 113.2* 112.6* 115.4* 112.5* 114.9*  PLT 179 209 201 225 191 275   Basic Metabolic Panel: Recent Labs  Lab 06/01/20 1017 06/02/20 0413 06/03/20 0024 06/03/20 1451 06/04/20 0104  NA 131* 128* 128* 130* 127*  K 4.5 4.7 5.3* 5.5* 5.6*  CL 104 103 102 104 102  CO2 18* 18* 18* 19* 16*  GLUCOSE 144* 148* 382* 213* 378*  BUN 62* 65* 68* 67* 72*  CREATININE 1.84* 1.78* 1.84* 1.62* 1.82*  CALCIUM 8.2* 8.3* 8.6* 8.8* 8.6*  MG 2.2  --   --   --   --  GFR: Estimated Creatinine Clearance: 37 mL/min (A) (by C-G formula based on SCr of 1.82 mg/dL (H)). Liver Function Tests: Recent Labs  Lab 05/29/20 1129 05/31/20 0351 06/01/20 0500 06/02/20 0413  AST 96* 69* 59* 68*  ALT 82* 67* 37 34  ALKPHOS 106 102 105 127*  BILITOT 6.4* 5.9* 5.0* 4.8*  PROT 8.8* 8.4* 8.6* 8.7*  ALBUMIN 2.7* 2.5* 2.5* 2.5*   No results for input(s): LIPASE, AMYLASE in the last 168 hours. No results for input(s): AMMONIA in the last 168 hours. Coagulation Profile: No results for input(s): INR, PROTIME in the last 168 hours. Cardiac Enzymes: No results for input(s): CKTOTAL, CKMB, CKMBINDEX, TROPONINI in the last 168 hours. BNP (last  3 results) No results for input(s): PROBNP in the last 8760 hours. HbA1C: No results for input(s): HGBA1C in the last 72 hours. CBG: Recent Labs  Lab 06/03/20 1524 06/03/20 2132 06/03/20 2355 06/04/20 0444 06/04/20 0823  GLUCAP 200* 330* 341* 246* 184*   Lipid Profile: No results for input(s): CHOL, HDL, LDLCALC, TRIG, CHOLHDL, LDLDIRECT in the last 72 hours. Thyroid Function Tests: No results for input(s): TSH, T4TOTAL, FREET4, T3FREE, THYROIDAB in the last 72 hours. Anemia Panel: Recent Labs    06/02/20 0413  RETICCTPCT Not Measured   Sepsis Labs: No results for input(s): PROCALCITON, LATICACIDVEN in the last 168 hours.  Recent Results (from the past 240 hour(s))  Resp Panel by RT-PCR (Flu A&B, Covid) Nasopharyngeal Swab     Status: None   Collection Time: 05/27/20  4:26 PM   Specimen: Nasopharyngeal Swab; Nasopharyngeal(NP) swabs in vial transport medium  Result Value Ref Range Status   SARS Coronavirus 2 by RT PCR NEGATIVE NEGATIVE Final    Comment: (NOTE) SARS-CoV-2 target nucleic acids are NOT DETECTED.  The SARS-CoV-2 RNA is generally detectable in upper respiratory specimens during the acute phase of infection. The lowest concentration of SARS-CoV-2 viral copies this assay can detect is 138 copies/mL. A negative result does not preclude SARS-Cov-2 infection and should not be used as the sole basis for treatment or other patient management decisions. A negative result may occur with  improper specimen collection/handling, submission of specimen other than nasopharyngeal swab, presence of viral mutation(s) within the areas targeted by this assay, and inadequate number of viral copies(<138 copies/mL). A negative result must be combined with clinical observations, patient history, and epidemiological information. The expected result is Negative.  Fact Sheet for Patients:  BloggerCourse.com  Fact Sheet for Healthcare Providers:   SeriousBroker.it  This test is no t yet approved or cleared by the [place] FDA and  has been authorized for detection and/or diagnosis of SARS-CoV-2 by FDA under an Emergency Use Authorization (EUA). This EUA will remain  in effect (meaning this test can be used) for the duration of the COVID-19 declaration under Section 564(b)(1) of the Act, 21 U.S.C.section 360bbb-3(b)(1), unless the authorization is terminated  or revoked sooner.       Influenza A by PCR NEGATIVE NEGATIVE Final   Influenza B by PCR NEGATIVE NEGATIVE Final    Comment: (NOTE) The Xpert Xpress SARS-CoV-2/FLU/RSV plus assay is intended as an aid in the diagnosis of influenza from Nasopharyngeal swab specimens and should not be used as a sole basis for treatment. Nasal washings and aspirates are unacceptable for Xpert Xpress SARS-CoV-2/FLU/RSV testing.  Fact Sheet for Patients: BloggerCourse.com  Fact Sheet for Healthcare Providers: SeriousBroker.it  This test is not yet approved or cleared by the [place] and has been authorized  for detection and/or diagnosis of SARS-CoV-2 by FDA under an Emergency Use Authorization (EUA). This EUA will remain in effect (meaning this test can be used) for the duration of the COVID-19 declaration under Section 564(b)(1) of the Act, 21 U.S.C. section 360bbb-3(b)(1), unless the authorization is terminated or revoked.  Performed at [facility], [address] [place], [place] [address]   Culture, blood (routine x 2)     Status: Abnormal   Collection Time: 05/28/20 10:45 PM   Specimen: BLOOD RIGHT HAND  Result Value Ref Range Status   Specimen Description BLOOD RIGHT HAND  Final   Special Requests   Final    BOTTLES DRAWN AEROBIC ONLY Blood Culture adequate volume   Culture  Setup Time   Final    GRAM POSITIVE COCCI IN CLUSTERS AEROBIC BOTTLE ONLY CRITICAL VALUE NOTED.   VALUE IS CONSISTENT WITH PREVIOUSLY REPORTED AND CALLED VALUE. Performed at [facility], [address] [place], [place] [address]    Culture STAPHYLOCOCCUS CAPITIS (A)  Final   Report Status 06/03/2020 FINAL  Final   Organism ID, Bacteria STAPHYLOCOCCUS CAPITIS  Final      Susceptibility   Staphylococcus capitis - MIC*    CIPROFLOXACIN <=0.5 SENSITIVE Sensitive     ERYTHROMYCIN >=8 RESISTANT Resistant     GENTAMICIN <=0.5 SENSITIVE Sensitive     OXACILLIN <=0.25 SENSITIVE Sensitive     TETRACYCLINE <=1 SENSITIVE Sensitive     VANCOMYCIN <=0.5 SENSITIVE Sensitive     TRIMETH/SULFA <=10 SENSITIVE Sensitive     CLINDAMYCIN <=0.25 SENSITIVE Sensitive     RIFAMPIN <=0.5 SENSITIVE Sensitive     Inducible Clindamycin NEGATIVE Sensitive     * STAPHYLOCOCCUS CAPITIS  Culture, blood (routine x 2)     Status: Abnormal   Collection Time: 05/28/20 10:45 PM   Specimen: BLOOD LEFT HAND  Result Value Ref Range Status   Specimen Description BLOOD LEFT HAND  Final   Special Requests   Final    BOTTLES DRAWN AEROBIC ONLY Blood Culture results may not be optimal due to an inadequate volume of blood received in culture bottles   Culture  Setup Time   Final    GRAM POSITIVE COCCI IN CLUSTERS AEROBIC BOTTLE ONLY Organism ID to follow CRITICAL RESULT CALLED TO, READ BACK BY AND VERIFIED WITH: C. AMEND PHARMD, AT 0550 05/30/20 BY D VANHOOK Performed at [facility], [address], [place], [place] [address]    Culture STAPHYLOCOCCUS CAPITIS (A)  Final   Report Status 06/01/2020 FINAL  Final   Organism ID, Bacteria STAPHYLOCOCCUS CAPITIS  Final      Susceptibility   Staphylococcus capitis - MIC*    CIPROFLOXACIN <=0.5 SENSITIVE Sensitive     ERYTHROMYCIN >=8 RESISTANT Resistant     GENTAMICIN <=0.5 SENSITIVE Sensitive     OXACILLIN <=0.25 SENSITIVE Sensitive     TETRACYCLINE <=1 SENSITIVE Sensitive     VANCOMYCIN <=0.5 SENSITIVE Sensitive     TRIMETH/SULFA <=10 SENSITIVE Sensitive      CLINDAMYCIN <=0.25 SENSITIVE Sensitive     RIFAMPIN <=0.5 SENSITIVE Sensitive     Inducible Clindamycin NEGATIVE Sensitive     * STAPHYLOCOCCUS CAPITIS  Blood Culture ID Panel (Reflexed)     Status: Abnormal   Collection Time: 05/28/20 10:45 PM  Result Value Ref Range Status   Enterococcus faecalis NOT DETECTED NOT DETECTED Final   Enterococcus Faecium NOT DETECTED NOT DETECTED Final   Listeria monocytogenes NOT DETECTED NOT DETECTED Final  Staphylococcus species DETECTED (A) NOT DETECTED Final    Comment: CRITICAL RESULT CALLED TO, READ BACK BY AND VERIFIED WITH: C. AMEND PHARMD, AT 0550 05/30/20 BY D VANHOOK    Staphylococcus aureus (BCID) NOT DETECTED NOT DETECTED Final   Staphylococcus epidermidis NOT DETECTED NOT DETECTED Final   Staphylococcus lugdunensis NOT DETECTED NOT DETECTED Final   Streptococcus species NOT DETECTED NOT DETECTED Final   Streptococcus agalactiae NOT DETECTED NOT DETECTED Final   Streptococcus pneumoniae NOT DETECTED NOT DETECTED Final   Streptococcus pyogenes NOT DETECTED NOT DETECTED Final   A.calcoaceticus-baumannii NOT DETECTED NOT DETECTED Final   Bacteroides fragilis NOT DETECTED NOT DETECTED Final   Enterobacterales NOT DETECTED NOT DETECTED Final   Enterobacter cloacae complex NOT DETECTED NOT DETECTED Final   Escherichia coli NOT DETECTED NOT DETECTED Final   Klebsiella aerogenes NOT DETECTED NOT DETECTED Final   Klebsiella oxytoca NOT DETECTED NOT DETECTED Final   Klebsiella pneumoniae NOT DETECTED NOT DETECTED Final   Proteus species NOT DETECTED NOT DETECTED Final   Salmonella species NOT DETECTED NOT DETECTED Final   Serratia marcescens NOT DETECTED NOT DETECTED Final   Haemophilus influenzae NOT DETECTED NOT DETECTED Final   Neisseria meningitidis NOT DETECTED NOT DETECTED Final   Pseudomonas aeruginosa NOT DETECTED NOT DETECTED Final   Stenotrophomonas maltophilia NOT DETECTED NOT DETECTED Final   Candida albicans NOT DETECTED NOT  DETECTED Final   Candida auris NOT DETECTED NOT DETECTED Final   Candida glabrata NOT DETECTED NOT DETECTED Final   Candida krusei NOT DETECTED NOT DETECTED Final   Candida parapsilosis NOT DETECTED NOT DETECTED Final   Candida tropicalis NOT DETECTED NOT DETECTED Final   Cryptococcus neoformans/gattii NOT DETECTED NOT DETECTED Final    Comment: Performed at [facility], [address], [place], [place] [address]  Culture, blood (routine x 2)     Status: None   Collection Time: 05/29/20  2:38 PM   Specimen: BLOOD LEFT HAND  Result Value Ref Range Status   Specimen Description BLOOD LEFT HAND  Final   Special Requests   Final    BOTTLES DRAWN AEROBIC AND ANAEROBIC Blood Culture results may not be optimal due to an inadequate volume of blood received in culture bottles   Culture   Final    NO GROWTH 5 DAYS Performed at [facility], [address] [place], [place] [address]    Report Status 06/03/2020 FINAL  Final  Culture, blood (routine x 2)     Status: None   Collection Time: 05/29/20  2:46 PM   Specimen: BLOOD RIGHT HAND  Result Value Ref Range Status   Specimen Description BLOOD RIGHT HAND  Final   Special Requests   Final    BOTTLES DRAWN AEROBIC ONLY Blood Culture results may not be optimal due to an inadequate volume of blood received in culture bottles   Culture   Final    NO GROWTH 5 DAYS Performed at [facility], [address], [place], [place] [address]    Report Status 06/03/2020 FINAL  Final  Culture, Urine     Status: Abnormal   Collection Time: 05/30/20  5:41 AM   Specimen: Urine, Catheterized  Result Value Ref Range Status   Specimen Description URINE, CATHETERIZED  Final   Special Requests NONE  Final   Culture (A)  Final    <10,000 COLONIES/mL INSIGNIFICANT GROWTH Performed at [facility], [address], [place], [place] [address]    Report  Status 05/31/2020 FINAL  Final  MRSA PCR Screening     Status: None   Collection Time:  06/01/20  8:40 PM   Specimen: Nasopharyngeal  Result Value Ref Range Status   MRSA by PCR NEGATIVE NEGATIVE Final    Comment:        The GeneXpert MRSA Assay (FDA approved for NASAL specimens only), is one component of a comprehensive MRSA colonization surveillance program. It is not intended to diagnose MRSA infection nor to guide or monitor treatment for MRSA infections. Performed at [facility], [address] [place], [place] [address]          Radiology Studies: No results found.      Scheduled Meds: . Chlorhexidine Gluconate Cloth  6 each Topical Daily  . [START ON 06/05/2020] enoxaparin (LOVENOX) injection  30 mg Subcutaneous Q24H  . hydroxyurea  1,000 mg Oral Daily  . insulin aspart  0-9 Units Subcutaneous Q4H  . insulin glargine  12 Units Subcutaneous QHS  . multivitamin with minerals  1 tablet Oral Daily  . [START ON 06/05/2020] predniSONE  10 mg Oral Q breakfast  . [START ON 06/07/2020] predniSONE  5 mg Oral Q breakfast  . sodium chloride flush  10-40 mL Intracatheter Q12H   Continuous Infusions: . sodium chloride 10 mL/hr at 06/03/20 1200  . amiodarone 30 mg/hr (06/04/20 0700)  .  ceFAZolin (ANCEF) IV Stopped (06/04/20 0531)     LOS: 8 days    Time spent: 39 minutes spent on chart review, discussion with nursing staff, consultants, updating family and interview/physical exam; more than 50% of that time was spent in counseling and/or coordination of care.    Alvira Philips [place], DO [facility] Available via Epic secure chat 7am-7pm After these hours, please refer to coverage provider listed on [email or web address] 06/04/2020, 11:48 AM

## 2020-06-04 NOTE — Procedures (Signed)
Interventional Radiology Procedure Note  Procedure: Right chest port removal  Complications: None  Estimated Blood Loss: < 10 mL  Findings: Right chest dual lumen port removed in entirety. Port pocket clean and not grossly infected. After irrigation of pocket, wound closed with dual layer absorbable suture closure and Dermabond applied to incision.  Jodi Marble. Fredia Sorrow, M.D Pager:  [phone]

## 2020-06-04 NOTE — Progress Notes (Addendum)
Inpatient Diabetes Program Recommendations  AACE/ADA: New Consensus Statement on Inpatient Glycemic Control   Target Ranges:  Prepandial:   less than 140 mg/dL      Peak postprandial:   less than 180 mg/dL (1-2 hours)      Critically ill patients:  140 - 180 mg/dL   Results for PAKOU, RAINBOW (MRN [id number]) as of 06/04/2020 11:29  Ref. Range 06/03/2020 07:55 06/03/2020 11:15 06/03/2020 15:24 06/03/2020 21:32 06/03/2020 23:55 06/04/2020 04:44 06/04/2020 08:23  Glucose-Capillary Latest Ref Range: 70 - 99 mg/dL 177 (H)  Novolog 3 units 158 (H)  Novolog 2 units 200 (H)  Novolog 2 units 330 (H)  Novolog 7 units  Lantus 12 units 341 (H)  Novolog 7 units 246 (H)  Novolog 3 units 184 (H)  Novolog 2 units   Review of Glycemic Control  Diabetes history: DM2 Outpatient Diabetes medications: Lantus 30 units QHS, Novolog 5 units TID with meals, Jardiance 25 mg BID, Metformin 1000 mg BID Current orders for Inpatient glycemic control: Lantus 12 units QHS, Novolog 0-9 units Q4H  Inpatient Diabetes Program Recommendations:    Insulin: Please consider increasing Lantus to 20 units QHS and increasing Novolog to 0-15 units Q4H. Once diet is resumed, please consider ordering Novolog 5 units TID with meals for meal coverage if patient eats at least 50% of meals.  Thanks, Orlando Penner, RN, MSN, CDE Diabetes Coordinator Inpatient Diabetes Program [phone] (Team Pager from 8am to 5pm)

## 2020-06-04 NOTE — Progress Notes (Signed)
[facility]  Date of Admission:  05/27/2020      Total days of antibiotics 6  Cefazolin 4  Vancomycin 2 days (stopped 3/21)          ASSESSMENT: Pamela Hudson is a 35 y.o. female here with acute on chronic exacerbation of right sided heart failure / pulmonary artery hypertension.  She was also found to have leukocytosis, fever and staphylococcus capitus growing in 2/4 blood culture bottles from two sites c/w true bacteremia. TEE obtained with new reduced LVEF, wide open TR and no evidence of endocarditis. 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 She seems open to leaving this out at this time and discussing further with her hematology team about need to have it replaced. Blood cultures cleared. With port out would give her an additional 7 days of treatment with IV cefazolin through 3/30. Should she be discharged earlier than end date can finish out treatment with linezolid to spare a line for her at discharge.   Leukocytosis was improving until steroids introduced for gout flare - she has chronic leukocytosis ranges from around 11-16K in reviewing her outpatient records.   New LVEF in the setting of severe RV failure - not an inotrope candidate with arrhythmias.    PLAN: 1. Continue cefazolin while inpatient. End date of abx 3/30. Can switch to linezolid 600 mg bid to complete if she is ready to D/C prior to this date.  2. Would advocate holding off on port replacement (she seems OK with this today).     Principal Problem:   Bacteremia Active Problems:   Sickle cell anemia (HCC)   Elevated bilirubin   Leukocytosis   Dyspnea   Acute on chronic right-sided heart failure (HCC)   Chronic right-sided heart failure (HCC)   Pulmonary hypertension (HCC)   CKD (chronic kidney disease)   DM2 (diabetes mellitus, type 2) (HCC)   SVT (supraventricular tachycardia) (HCC)   . Chlorhexidine Gluconate Cloth  6 each  Topical Daily  . [START ON 06/05/2020] enoxaparin (LOVENOX) injection  30 mg Subcutaneous Q24H  . hydroxyurea  1,000 mg Oral Daily  . insulin aspart  0-9 Units Subcutaneous Q4H  . insulin glargine  12 Units Subcutaneous QHS  . multivitamin with minerals  1 tablet Oral Daily  . [START ON 06/05/2020] predniSONE  10 mg Oral Q breakfast  . [START ON 06/07/2020] predniSONE  5 mg Oral Q breakfast  . sodium chloride flush  10-40 mL Intracatheter Q12H    SUBJECTIVE: No new complaints. Would be open to discussing replacing port with hematology team outpatient if this is best.  Pending port removal today. Still with foot wrist pain but better.    Review of Systems: Review of Systems  Constitutional: Negative for chills, fever, malaise/fatigue and weight loss.  HENT: Negative for sore throat.   Respiratory: Positive for cough. Negative for sputum production.   Cardiovascular: Negative for chest pain and leg swelling.  Gastrointestinal: Negative for abdominal pain, diarrhea and vomiting.  Genitourinary: Negative for dysuria and flank pain.  Musculoskeletal: Positive for joint pain (L foot, R wrist - pain improved). Negative for myalgias and neck pain.  Skin: Negative for rash.  Neurological: Negative for dizziness, tingling and headaches.  Psychiatric/Behavioral: Negative for depression and substance abuse. The patient is not nervous/anxious and does not have insomnia.     Allergies  Allergen Reactions  . Oxycodone-Acetaminophen Swelling and Other (  See Comments)    Lips swell  . Prednisone Other (See Comments)    Elevates BGL    OBJECTIVE: Vitals:   06/04/20 0500 06/04/20 0600 06/04/20 0700 06/04/20 0826  BP: 99/66 99/66 103/66   Pulse: 93 90 91   Resp: 12 (!) 25 (!) 24   Temp:    98.4 F (36.9 C)  TempSrc:    Oral  SpO2: 98% 96% 99%   Weight:      Height:       Body mass index is 29.88 kg/m.    Physical Exam Vitals and nursing note reviewed.  Constitutional:       Comments: Resting quietly in bed.   HENT:     Mouth/Throat:     Mouth: No oral lesions.     Dentition: No dental abscesses.  Cardiovascular:     Rate and Rhythm: Normal rate and regular rhythm.     Heart sounds: Murmur heard.    Pulmonary:     Effort: Pulmonary effort is normal.     Breath sounds: Normal breath sounds.  Chest:       Comments: Chest port accessed - some ecchymosis noted to the medial boarder. No tenderness, warmth or fluctuance Abdominal:     General: There is no distension.     Palpations: Abdomen is soft.     Tenderness: There is no abdominal tenderness.  Musculoskeletal:        General: No tenderness. Normal range of motion.     Cervical back: No tenderness.  Lymphadenopathy:     Cervical: No cervical adenopathy.  Skin:    General: Skin is warm and dry.     Capillary Refill: Capillary refill takes less than 2 seconds.     Findings: No rash.  Neurological:     Mental Status: She is alert and oriented to person, place, and time.  Psychiatric:        Judgment: Judgment normal.     Lab Results Lab Results  Component Value Date   WBC 16.9 (H) 06/04/2020   HGB 8.5 (L) 06/04/2020   HCT 23.2 (L) 06/04/2020   MCV 114.9 (H) 06/04/2020   PLT 275 06/04/2020    Lab Results  Component Value Date   CREATININE 1.82 (H) 06/04/2020   BUN 72 (H) 06/04/2020   NA 127 (L) 06/04/2020   K 5.6 (H) 06/04/2020   CL 102 06/04/2020   CO2 16 (L) 06/04/2020    Lab Results  Component Value Date   ALT 34 06/02/2020   AST 68 (H) 06/02/2020   ALKPHOS 127 (H) 06/02/2020   BILITOT 4.8 (H) 06/02/2020     Microbiology: Recent Results (from the past 240 hour(s))  Resp Panel by RT-PCR (Flu A&B, Covid) Nasopharyngeal Swab     Status: None   Collection Time: 05/27/20  4:26 PM   Specimen: Nasopharyngeal Swab; Nasopharyngeal(NP) swabs in vial transport medium  Result Value Ref Range Status   SARS Coronavirus 2 by RT PCR NEGATIVE NEGATIVE Final    Comment:  (NOTE) SARS-CoV-2 target nucleic acids are NOT DETECTED.  The SARS-CoV-2 RNA is generally detectable in upper respiratory specimens during the acute phase of infection. The lowest concentration of SARS-CoV-2 viral copies this assay can detect is 138 copies/mL. A negative result does not preclude SARS-Cov-2 infection and should not be used as the sole basis for treatment or other patient management decisions. A negative result may occur with  improper specimen collection/handling, submission of specimen other than nasopharyngeal swab, presence  of viral mutation(s) within the areas targeted by this assay, and inadequate number of viral copies(<138 copies/mL). A negative result must be combined with clinical observations, patient history, and epidemiological information. The expected result is Negative.  Fact Sheet for Patients:  BloggerCourse.com  Fact Sheet for Healthcare Providers:  SeriousBroker.it  This test is no t yet approved or cleared by the [place] FDA and  has been authorized for detection and/or diagnosis of SARS-CoV-2 by FDA under an Emergency Use Authorization (EUA). This EUA will remain  in effect (meaning this test can be used) for the duration of the COVID-19 declaration under Section 564(b)(1) of the Act, 21 U.S.C.section 360bbb-3(b)(1), unless the authorization is terminated  or revoked sooner.       Influenza A by PCR NEGATIVE NEGATIVE Final   Influenza B by PCR NEGATIVE NEGATIVE Final    Comment: (NOTE) The Xpert Xpress SARS-CoV-2/FLU/RSV plus assay is intended as an aid in the diagnosis of influenza from Nasopharyngeal swab specimens and should not be used as a sole basis for treatment. Nasal washings and aspirates are unacceptable for Xpert Xpress SARS-CoV-2/FLU/RSV testing.  Fact Sheet for Patients: BloggerCourse.com  Fact Sheet for Healthcare  Providers: SeriousBroker.it  This test is not yet approved or cleared by the [place] FDA and has been authorized for detection and/or diagnosis of SARS-CoV-2 by FDA under an Emergency Use Authorization (EUA). This EUA will remain in effect (meaning this test can be used) for the duration of the COVID-19 declaration under Section 564(b)(1) of the Act, 21 U.S.C. section 360bbb-3(b)(1), unless the authorization is terminated or revoked.  Performed at [facility], [address] [place], [place] [address]   Culture, blood (routine x 2)     Status: Abnormal   Collection Time: 05/28/20 10:45 PM   Specimen: BLOOD RIGHT HAND  Result Value Ref Range Status   Specimen Description BLOOD RIGHT HAND  Final   Special Requests   Final    BOTTLES DRAWN AEROBIC ONLY Blood Culture adequate volume   Culture  Setup Time   Final    GRAM POSITIVE COCCI IN CLUSTERS AEROBIC BOTTLE ONLY CRITICAL VALUE NOTED.  VALUE IS CONSISTENT WITH PREVIOUSLY REPORTED AND CALLED VALUE. Performed at [facility], [address] [place], [place] [address]    Culture STAPHYLOCOCCUS CAPITIS (A)  Final   Report Status 06/03/2020 FINAL  Final   Organism ID, Bacteria STAPHYLOCOCCUS CAPITIS  Final      Susceptibility   Staphylococcus capitis - MIC*    CIPROFLOXACIN <=0.5 SENSITIVE Sensitive     ERYTHROMYCIN >=8 RESISTANT Resistant     GENTAMICIN <=0.5 SENSITIVE Sensitive     OXACILLIN <=0.25 SENSITIVE Sensitive     TETRACYCLINE <=1 SENSITIVE Sensitive     VANCOMYCIN <=0.5 SENSITIVE Sensitive     TRIMETH/SULFA <=10 SENSITIVE Sensitive     CLINDAMYCIN <=0.25 SENSITIVE Sensitive     RIFAMPIN <=0.5 SENSITIVE Sensitive     Inducible Clindamycin NEGATIVE Sensitive     * STAPHYLOCOCCUS CAPITIS  Culture, blood (routine x 2)     Status: Abnormal   Collection Time: 05/28/20 10:45 PM   Specimen: BLOOD LEFT HAND  Result Value Ref Range Status   Specimen Description BLOOD LEFT  HAND  Final   Special Requests   Final    BOTTLES DRAWN AEROBIC ONLY Blood Culture results may not be optimal due to an inadequate volume of blood received in culture bottles   Culture  Setup Time   Final  GRAM POSITIVE COCCI IN CLUSTERS AEROBIC BOTTLE ONLY Organism ID to follow CRITICAL RESULT CALLED TO, READ BACK BY AND VERIFIED WITH: C. AMEND PHARMD, AT 0550 05/30/20 BY D VANHOOK Performed at [facility], [address] [place], [place] [address]    Culture STAPHYLOCOCCUS CAPITIS (A)  Final   Report Status 06/01/2020 FINAL  Final   Organism ID, Bacteria STAPHYLOCOCCUS CAPITIS  Final      Susceptibility   Staphylococcus capitis - MIC*    CIPROFLOXACIN <=0.5 SENSITIVE Sensitive     ERYTHROMYCIN >=8 RESISTANT Resistant     GENTAMICIN <=0.5 SENSITIVE Sensitive     OXACILLIN <=0.25 SENSITIVE Sensitive     TETRACYCLINE <=1 SENSITIVE Sensitive     VANCOMYCIN <=0.5 SENSITIVE Sensitive     TRIMETH/SULFA <=10 SENSITIVE Sensitive     CLINDAMYCIN <=0.25 SENSITIVE Sensitive     RIFAMPIN <=0.5 SENSITIVE Sensitive     Inducible Clindamycin NEGATIVE Sensitive     * STAPHYLOCOCCUS CAPITIS  Blood Culture ID Panel (Reflexed)     Status: Abnormal   Collection Time: 05/28/20 10:45 PM  Result Value Ref Range Status   Enterococcus faecalis NOT DETECTED NOT DETECTED Final   Enterococcus Faecium NOT DETECTED NOT DETECTED Final   Listeria monocytogenes NOT DETECTED NOT DETECTED Final   Staphylococcus species DETECTED (A) NOT DETECTED Final    Comment: CRITICAL RESULT CALLED TO, READ BACK BY AND VERIFIED WITH: C. AMEND PHARMD, AT 0550 05/30/20 BY D VANHOOK    Staphylococcus aureus (BCID) NOT DETECTED NOT DETECTED Final   Staphylococcus epidermidis NOT DETECTED NOT DETECTED Final   Staphylococcus lugdunensis NOT DETECTED NOT DETECTED Final   Streptococcus species NOT DETECTED NOT DETECTED Final   Streptococcus agalactiae NOT DETECTED NOT DETECTED Final   Streptococcus pneumoniae NOT DETECTED  NOT DETECTED Final   Streptococcus pyogenes NOT DETECTED NOT DETECTED Final   A.calcoaceticus-baumannii NOT DETECTED NOT DETECTED Final   Bacteroides fragilis NOT DETECTED NOT DETECTED Final   Enterobacterales NOT DETECTED NOT DETECTED Final   Enterobacter cloacae complex NOT DETECTED NOT DETECTED Final   Escherichia coli NOT DETECTED NOT DETECTED Final   Klebsiella aerogenes NOT DETECTED NOT DETECTED Final   Klebsiella oxytoca NOT DETECTED NOT DETECTED Final   Klebsiella pneumoniae NOT DETECTED NOT DETECTED Final   Proteus species NOT DETECTED NOT DETECTED Final   Salmonella species NOT DETECTED NOT DETECTED Final   Serratia marcescens NOT DETECTED NOT DETECTED Final   Haemophilus influenzae NOT DETECTED NOT DETECTED Final   Neisseria meningitidis NOT DETECTED NOT DETECTED Final   Pseudomonas aeruginosa NOT DETECTED NOT DETECTED Final   Stenotrophomonas maltophilia NOT DETECTED NOT DETECTED Final   Candida albicans NOT DETECTED NOT DETECTED Final   Candida auris NOT DETECTED NOT DETECTED Final   Candida glabrata NOT DETECTED NOT DETECTED Final   Candida krusei NOT DETECTED NOT DETECTED Final   Candida parapsilosis NOT DETECTED NOT DETECTED Final   Candida tropicalis NOT DETECTED NOT DETECTED Final   Cryptococcus neoformans/gattii NOT DETECTED NOT DETECTED Final    Comment: Performed at [facility], [address], [place], [place] [address]  Culture, blood (routine x 2)     Status: None   Collection Time: 05/29/20  2:38 PM   Specimen: BLOOD LEFT HAND  Result Value Ref Range Status   Specimen Description BLOOD LEFT HAND  Final   Special Requests   Final    BOTTLES DRAWN AEROBIC AND ANAEROBIC Blood Culture results may not be optimal due to an inadequate volume  of blood received in culture bottles   Culture   Final    NO GROWTH 5 DAYS Performed at [facility], [address], [place], [place] [address]    Report Status 06/03/2020 FINAL  Final  Culture, blood (routine  x 2)     Status: None   Collection Time: 05/29/20  2:46 PM   Specimen: BLOOD RIGHT HAND  Result Value Ref Range Status   Specimen Description BLOOD RIGHT HAND  Final   Special Requests   Final    BOTTLES DRAWN AEROBIC ONLY Blood Culture results may not be optimal due to an inadequate volume of blood received in culture bottles   Culture   Final    NO GROWTH 5 DAYS Performed at [facility], [address] [place], [place] [address]    Report Status 06/03/2020 FINAL  Final  Culture, Urine     Status: Abnormal   Collection Time: 05/30/20  5:41 AM   Specimen: Urine, Catheterized  Result Value Ref Range Status   Specimen Description URINE, CATHETERIZED  Final   Special Requests NONE  Final   Culture (A)  Final    <10,000 COLONIES/mL INSIGNIFICANT GROWTH Performed at [facility], [address] [place], [place] [address]    Report Status 05/31/2020 FINAL  Final  MRSA PCR Screening     Status: None   Collection Time: 06/01/20  8:40 PM   Specimen: Nasopharyngeal  Result Value Ref Range Status   MRSA by PCR NEGATIVE NEGATIVE Final    Comment:        The GeneXpert MRSA Assay (FDA approved for NASAL specimens only), is one component of a comprehensive MRSA colonization surveillance program. It is not intended to diagnose MRSA infection nor to guide or monitor treatment for MRSA infections. Performed at [facility], [address], [place], [place] [address]      Rexene Alberts, MSN, NP-C [facility]  [email or web address] Pager: [phone] Office: [phone] [facility] Main Line: [phone]

## 2020-06-04 NOTE — Progress Notes (Signed)
Nutrition Follow-up  DOCUMENTATION CODES:   Not applicable  INTERVENTION:   D/C Ensure Enlive  Add Snacks TID between meals   NUTRITION DIAGNOSIS:   Increased nutrient needs related to chronic illness (CHF, CKD3b) as evidenced by estimated needs.  Being addressed via snacks  GOAL:   Patient will meet greater than or equal to 90% of their needs  Progressing  MONITOR:   PO intake,Supplement acceptance,Skin,I & O's,Labs,Weight trends  REASON FOR ASSESSMENT:   Consult Assessment of nutrition requirement/status  ASSESSMENT:   19 YOF admitted for sickle cell anemia. PMH of CVA, sickle cell anemia, PCOS, chronic pain, DM2, CKD3b, cardiomegaly, liver disease, HTN, CHF, cholecystectomy.  3/22 TEE performed  Severe TR per ECHO, TEE yesterday to assess for possible endocarditis  NPO today for procedure (port removal); otherwise pt reports good appetite on Heart Healthy/Carb Modified diet with recorded po intake 90-100% of meals  Pt is not drinking Ensure Enlive; does not like. Plan to supplement with snacks between meals instead  Labs: sodium 127 (L), potassium 5.6 (H), CBGs 184-341 Meds: lasix, ss novolog, lantus, MVI with Minerals, prednisone, lokelma    Diet Order:   Diet Order            Diet NPO time specified Except for: Sips with Meds  Diet effective midnight                 EDUCATION NEEDS:   No education needs have been identified at this time  Skin:  Skin Assessment: Reviewed RN Assessment  Last BM:  3/20  Height:   Ht Readings from Last 1 Encounters:  05/27/20 4\' 11"  (1.499 m)    Weight:   Wt Readings from Last 1 Encounters:  06/04/20 67.1 kg    Ideal Body Weight:  44.7 kg  BMI:  Body mass index is 29.88 kg/m.  Estimated Nutritional Needs:   Kcal:  1900-2100 kcals  Protein:  95-105 g  Fluid:  >/= 1.9 L   06/06/20 MS, RDN, LDN, CNSC Registered Dietitian III Clinical Nutrition RD Pager and On-Call Pager Number Located  in [place]

## 2020-06-04 NOTE — Progress Notes (Addendum)
Advanced Heart Failure Rounding Note  PCP-Cardiologist: No primary care provider on file.   Subjective:    - 3/20 Developed hemodynamically unable SVT, HR in the 190s. Failed vagal maneuvers, adenosine x 4, converted on amio gtt. Moved to CCU.  Milrinone stopped for hypotension. Started on Neo. - 3/22 had recurrent SVT restarted on amio gtt  Being treated for staph capitus bacteremia. ID following. Abx changed Ancef. AF. WBC elevated at 17K.  TEE 3/22 negative for endocarditis.   No further SVT overnight. NSR. HR 90s.   I/Os not accurate. Had several uncharted urinary recurrences yesterday. Wt unchanged (bed wt. Rn to check standing wt).  SCr 1.84>1.62>1.82. BUN trending up 67>>72 K 5.6 Na 127   Feels ok today. No dyspnea. Still w/ toe pain but improved.    Objective:   Weight Range: 67.1 kg Body mass index is 29.88 kg/m.   Vital Signs:   Temp:  [98.1 F (36.7 C)-98.4 F (36.9 C)] 98.2 F (36.8 C) (03/23 0400) Pulse Rate:  [55-130] 91 (03/23 0700) Resp:  [12-30] 24 (03/23 0700) BP: (84-111)/(66-88) 103/66 (03/23 0700) SpO2:  [91 %-100 %] 99 % (03/23 0700) Weight:  [67.1 kg] 67.1 kg (03/23 0118) Last BM Date: 06/01/20  Weight change: Filed Weights   06/02/20 0600 06/03/20 0500 06/04/20 0118  Weight: 66.5 kg 67 kg 67.1 kg    Intake/Output:   Intake/Output Summary (Last 24 hours) at 06/04/2020 0739 Last data filed at 06/04/2020 0700 Gross per 24 hour  Intake 937.87 ml  Output 1250 ml  Net -312.13 ml      Physical Exam    General:  Laying in bed. No resp difficulty HEENT: Normal Neck: Supple. Distended neck veins. JVP to ear Carotids 2+ bilat; no bruits. No lymphadenopathy or thyromegaly appreciated. + port rt upper chest  Cor: PMI nondisplaced. RRR.  3/6 TR murmur  Lungs: Clear. No wheezing  Abdomen: Soft, nontender, nondistended. No hepatosplenomegaly. No bruits or masses. Good bowel sounds. Extremities: No cyanosis, clubbing, rash, no  bilateral  dema Neuro: Alert & orientedx3, cranial nerves grossly intact. moves all 4 extremities w/o difficulty. Affect pleasant  Telemetry   NSR 90s  No further SVT  EKG    No new EKG to review   Labs    CBC Recent Labs    06/03/20 0024 06/04/20 0104  WBC 15.7* 16.9*  NEUTROABS 12.4* 13.2*  HGB 8.9* 8.5*  HCT 23.4* 23.2*  MCV 112.5* 114.9*  PLT 191 275   Basic Metabolic Panel Recent Labs    70/96/28 1017 06/02/20 0413 06/03/20 1451 06/04/20 0104  NA 131*   < > 130* 127*  K 4.5   < > 5.5* 5.6*  CL 104   < > 104 102  CO2 18*   < > 19* 16*  GLUCOSE 144*   < > 213* 378*  BUN 62*   < > 67* 72*  CREATININE 1.84*   < > 1.62* 1.82*  CALCIUM 8.2*   < > 8.8* 8.6*  MG 2.2  --   --   --    < > = values in this interval not displayed.   Liver Function Tests Recent Labs    06/02/20 0413  AST 68*  ALT 34  ALKPHOS 127*  BILITOT 4.8*  PROT 8.7*  ALBUMIN 2.5*   No results for input(s): LIPASE, AMYLASE in the last 72 hours. Cardiac Enzymes No results for input(s): CKTOTAL, CKMB, CKMBINDEX, TROPONINI in the last 72 hours.  BNP:  BNP (last 3 results) Recent Labs    05/27/20 1733 05/29/20 0615  BNP 1,228.5* 3,195.8*    ProBNP (last 3 results) No results for input(s): PROBNP in the last 8760 hours.   D-Dimer No results for input(s): DDIMER in the last 72 hours. Hemoglobin A1C No results for input(s): HGBA1C in the last 72 hours. Fasting Lipid Panel No results for input(s): CHOL, HDL, LDLCALC, TRIG, CHOLHDL, LDLDIRECT in the last 72 hours. Thyroid Function Tests No results for input(s): TSH, T4TOTAL, T3FREE, THYROIDAB in the last 72 hours.  Invalid input(s): FREET3  Other results:   Imaging    No results found.   Medications:     Scheduled Medications: . Chlorhexidine Gluconate Cloth  6 each Topical Daily  . [START ON 06/05/2020] enoxaparin (LOVENOX) injection  30 mg Subcutaneous Q24H  . feeding supplement  237 mL Oral BID BM  . hydroxyurea  1,000 mg  Oral Daily  . insulin aspart  0-9 Units Subcutaneous Q4H  . insulin glargine  12 Units Subcutaneous QHS  . multivitamin with minerals  1 tablet Oral Daily  . predniSONE  20 mg Oral Q breakfast   Followed by  . [START ON 06/05/2020] predniSONE  10 mg Oral Q breakfast  . [START ON 06/07/2020] predniSONE  5 mg Oral Q breakfast  . sodium chloride flush  10-40 mL Intracatheter Q12H    Infusions: . sodium chloride 10 mL/hr at 06/03/20 1200  . amiodarone 30 mg/hr (06/04/20 0700)  .  ceFAZolin (ANCEF) IV Stopped (06/04/20 0531)    PRN Medications: sodium chloride, acetaminophen, guaiFENesin-dextromethorphan, heparin lock flush, heparin lock flush, HYDROmorphone, ondansetron (ZOFRAN) IV, ondansetron (ZOFRAN) IV, sodium chloride flush, sodium chloride flush   Assessment/Plan    1. 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 hold lasix today w/ rising SCr/BUN    3. AKI on CKD  - Creatinine on admit 1.5-->2.5 - Overall improved, SCr 1.8 today. BUN trending up - Hold diuretics today and follow BMP   4. SVT  - now off milrinone - back on amio gtt w/ recurrence 3/22 - correct hyperkalemia  - monitor on tele   5. Sickle Cell Anemia  - per primary team   6. TR - Severe on echo - likely functional from severely dilated RV  - ? Primary leaflet pathology such as carcinoid involvement  - TEE 3/22 negative for endocarditis   7. Hyperkalemia - 5.6 today  - Give Lolkelma 10 mg x1. F/u BMP at noon   8. Bacteremia - BCs 3/16 + for Staph capitus - BCs 3/17 NGTD - WBC 17K. AF  - on abx, Ancef. ID following  - TEE negative for endocarditis  - Plan port removal today   9. Acute  Gout - Lt Great toe - colchicine on hold w/ AKI  - Improving w/ prednisone. Treat x 3 days  10. Hyponatremia - Na 127, menation ok - monitor, may need Tolvaptan if further drop\  11. Type 2DM - glucose running high in 300s - on insulin, adjustments per primary tea    Length of Stay: [address] PA-C  06/04/2020, 7:39 AM  Advanced Heart  Failure Team Pager [phone] (M-F; 7a - 5p)  Please contact [facility] for night-coverage after hours (4p -7a ) and weekends on [email or web address]  Agree with above  TEE yesterday with EF 30-35% and moderate to severe RV dysfunction. Wide-open TR.   Developed recurrent SVT yesterday and amio restarted. Now back in NSR.   Has been on IV lasix. Urine output modest. BUN and creatinine trending up slowly. Bicarb remains in 16-18 range.   On abx for staph capitus bacteremia  General:  Lying No resp difficulty HEENT: normal Neck: supple. JVP to jaw. Carotids 2+ bilat; no bruits. No lymphadenopathy or thryomegaly appreciated. Cor: PMI nondisplaced. Regular tachy. 2/6 TR Lungs: clear Abdomen: soft, nontender, nondistended. No hepatosplenomegaly. No bruits or masses. Good bowel sounds. Extremities: no cyanosis, clubbing, rash, edema Neuro: alert & orientedx3, cranial nerves grossly intact. moves all 4 extremities w/o difficulty. Affect pleasant  TEE yesterday did not show any evidence of endocarditis but has new onset LV dysfunction on top of RV dysfunction and severe TR. I am worried she has really become end-stage. Not candidate for restarting inotrope support due to severe SVT over the weekend with profound hypotension.   Will continue IV lasix to try and diurese gently,  Continue IV amio and abx, BP too low to initiate GDMT. Could consider digoxin if creatinine stabilizes.   CRITICAL CARE Performed by: Arvilla Meres  Total critical care time: 35 minutes  Critical care time was exclusive of separately billable procedures and  treating other patients.  Critical care was necessary to treat or prevent imminent or life-threatening deterioration.  Critical care was time spent personally by me (independent of midlevel providers or residents) on the following activities: development of treatment plan with patient and/or surrogate as well as nursing, discussions with consultants, evaluation of patient's response to treatment, examination of patient, obtaining history from patient or surrogate, ordering and performing treatments and interventions, ordering and review of laboratory studies, ordering and review of radiographic studies, pulse oximetry and re-evaluation of patient's condition.  Arvilla Meres, MD  8:19 AM

## 2020-06-04 NOTE — Progress Notes (Signed)
PT Cancellation Note  Patient Details Name: Pamela Hudson MRN: [id number] DOB: 04-24-1985   Cancelled Treatment:    Reason Eval/Treat Not Completed: Patient declined, no reason specified.  Just back from IR and has had no food.  "Not now, until I've eaten."  Will return today as able.  06/04/2020  Jacinto Halim., PT Acute Rehabilitation Services [phone]  (pager) [phone]  (office)   Eliseo Gum Zahir Eisenhour 06/04/2020, 3:59 PM

## 2020-06-05 ENCOUNTER — Encounter (HOSPITAL_COMMUNITY): Payer: Self-pay | Admitting: Internal Medicine

## 2020-06-05 DIAGNOSIS — R7881 Bacteremia: Secondary | ICD-10-CM | POA: Diagnosis not present

## 2020-06-05 LAB — CBC WITH DIFFERENTIAL/PLATELET
Abs Immature Granulocytes: 0 10*3/uL (ref 0.00–0.07)
Basophils Absolute: 0.3 10*3/uL — ABNORMAL HIGH (ref 0.0–0.1)
Basophils Relative: 2 %
Eosinophils Absolute: 0 10*3/uL (ref 0.0–0.5)
Eosinophils Relative: 0 %
HCT: 22.5 % — ABNORMAL LOW (ref 36.0–46.0)
Hemoglobin: 8.5 g/dL — ABNORMAL LOW (ref 12.0–15.0)
Lymphocytes Relative: 12 %
Lymphs Abs: 1.9 10*3/uL (ref 0.7–4.0)
MCH: 43.1 pg — ABNORMAL HIGH (ref 26.0–34.0)
MCHC: 37.8 g/dL — ABNORMAL HIGH (ref 30.0–36.0)
MCV: 114.2 fL — ABNORMAL HIGH (ref 80.0–100.0)
Monocytes Absolute: 1.1 10*3/uL — ABNORMAL HIGH (ref 0.1–1.0)
Monocytes Relative: 7 %
Neutro Abs: 12.4 10*3/uL — ABNORMAL HIGH (ref 1.7–7.7)
Neutrophils Relative %: 79 %
Platelets: 270 10*3/uL (ref 150–400)
RBC: 1.97 MIL/uL — ABNORMAL LOW (ref 3.87–5.11)
RDW: 20.7 % — ABNORMAL HIGH (ref 11.5–15.5)
WBC: 15.7 10*3/uL — ABNORMAL HIGH (ref 4.0–10.5)
nRBC: 21 /100 WBC — ABNORMAL HIGH
nRBC: 9.7 % — ABNORMAL HIGH (ref 0.0–0.2)

## 2020-06-05 LAB — BASIC METABOLIC PANEL
Anion gap: 9 (ref 5–15)
BUN: 70 mg/dL — ABNORMAL HIGH (ref 6–20)
CO2: 18 mmol/L — ABNORMAL LOW (ref 22–32)
Calcium: 8.7 mg/dL — ABNORMAL LOW (ref 8.9–10.3)
Chloride: 102 mmol/L (ref 98–111)
Creatinine, Ser: 2.02 mg/dL — ABNORMAL HIGH (ref 0.44–1.00)
GFR, Estimated: 33 mL/min — ABNORMAL LOW (ref >60)
Glucose, Bld: 283 mg/dL — ABNORMAL HIGH (ref 70–99)
Potassium: 5.3 mmol/L — ABNORMAL HIGH (ref 3.5–5.1)
Sodium: 129 mmol/L — ABNORMAL LOW (ref 135–145)

## 2020-06-05 LAB — HEPATIC FUNCTION PANEL
ALT: 15 U/L (ref 0–44)
AST: 61 U/L — ABNORMAL HIGH (ref 15–41)
Albumin: 2.6 g/dL — ABNORMAL LOW (ref 3.5–5.0)
Alkaline Phosphatase: 134 U/L — ABNORMAL HIGH (ref 38–126)
Bilirubin, Direct: 1.3 mg/dL — ABNORMAL HIGH (ref 0.0–0.2)
Indirect Bilirubin: 2.2 mg/dL — ABNORMAL HIGH (ref 0.3–0.9)
Total Bilirubin: 3.5 mg/dL — ABNORMAL HIGH (ref 0.3–1.2)
Total Protein: 9.3 g/dL — ABNORMAL HIGH (ref 6.5–8.1)

## 2020-06-05 LAB — GLUCOSE, CAPILLARY
Glucose-Capillary: 209 mg/dL — ABNORMAL HIGH (ref 70–99)
Glucose-Capillary: 212 mg/dL — ABNORMAL HIGH (ref 70–99)
Glucose-Capillary: 249 mg/dL — ABNORMAL HIGH (ref 70–99)
Glucose-Capillary: 271 mg/dL — ABNORMAL HIGH (ref 70–99)
Glucose-Capillary: 285 mg/dL — ABNORMAL HIGH (ref 70–99)
Glucose-Capillary: 397 mg/dL — ABNORMAL HIGH (ref 70–99)

## 2020-06-05 LAB — MAGNESIUM: Magnesium: 2.4 mg/dL (ref 1.7–2.4)

## 2020-06-05 MED ORDER — INSULIN ASPART 100 UNIT/ML ~~LOC~~ SOLN
5.0000 [IU] | Freq: Three times a day (TID) | SUBCUTANEOUS | Status: DC
  Administered 2020-06-05 (×3): 5 [IU] via SUBCUTANEOUS

## 2020-06-05 MED ORDER — INSULIN GLARGINE 100 UNIT/ML ~~LOC~~ SOLN
30.0000 [IU] | Freq: Every day | SUBCUTANEOUS | Status: DC
  Administered 2020-06-05: 30 [IU] via SUBCUTANEOUS
  Filled 2020-06-05 (×2): qty 0.3

## 2020-06-05 MED ORDER — FUROSEMIDE 10 MG/ML IJ SOLN
80.0000 mg | Freq: Once | INTRAMUSCULAR | Status: AC
  Administered 2020-06-05: 80 mg via INTRAVENOUS
  Filled 2020-06-05: qty 8

## 2020-06-05 MED ORDER — INSULIN GLARGINE 100 UNIT/ML ~~LOC~~ SOLN
10.0000 [IU] | Freq: Once | SUBCUTANEOUS | Status: AC
  Administered 2020-06-05: 10 [IU] via SUBCUTANEOUS
  Filled 2020-06-05: qty 0.1

## 2020-06-05 MED ORDER — INSULIN ASPART 100 UNIT/ML ~~LOC~~ SOLN
6.0000 [IU] | Freq: Once | SUBCUTANEOUS | Status: AC
  Administered 2020-06-05: 6 [IU] via SUBCUTANEOUS

## 2020-06-05 MED ORDER — SODIUM ZIRCONIUM CYCLOSILICATE 10 G PO PACK
10.0000 g | PACK | Freq: Once | ORAL | Status: AC
  Administered 2020-06-05: 10 g via ORAL
  Filled 2020-06-05: qty 1

## 2020-06-05 NOTE — Progress Notes (Addendum)
PROGRESS NOTE    Pamela Hudson  VVO:[id number] DOB: 12-05-1985 DOA: 05/27/2020 PCP: Judd Lien, PA-C    Brief Narrative:  Pamela Hudson is a 35 year old female with past medical history significant for sickle cell anemia, pulmonary hypertension, CKD stage IIIb, type 2 diabetes mellitus, CVA, PCOS, Hx COVID 2020 who presented to the ED with progressive shortness of breath, lower extremity edema reported 5 pound weight gain over the previous 3 days.  Patient was admitted for acute on chronic systolic/diastolic congestive heart failure exacerbation with associated pulmonary hypertension and was found to have staph capitis bacteremia.  Patient underwent right heart catheterization with findings of mild PAH, severe RV failure with preserved cardiac output with no evidence of intracardiac shunt.  On 05/30/2020 was started on a milrinone drip for assistance with aggressive diuresis per cardiology.  On 06/01/2020, patient developed SVT while on milrinone drip and was transreferred to the intensive care unit.  Milrinone drip was discontinued and patient was started on amiodarone infusion.  Patient was transferred from [facility] back to the hospitalist service on 06/04/2020.   Assessment & Plan:   Principal Problem:   Bacteremia Active Problems:   Sickle cell anemia (HCC)   Elevated bilirubin   Leukocytosis   Dyspnea   Acute on chronic right-sided heart failure (HCC)   Chronic right-sided heart failure (HCC)   Pulmonary hypertension (HCC)   CKD (chronic kidney disease)   DM2 (diabetes mellitus, type 2) (HCC)   SVT (supraventricular tachycardia) (HCC)   Biventricular heart failure Severe tricuspid regurgitation with core pulmonale SVT Patient presenting to the ED with progressive shortness of breath and lower extremity edema. TTE with LVEF 45-50%, RV severely reduced, RVSP 34.  Underwent right heart catheterization on 05/29/2020 with mild PAH but with severe RV failure.  Patient was started on IV  diuresis with milrinone drip support, but subsequently developed SVT in which she was transferred to the intensive care unit.  Milrinone was discontinued and patient was started on amiodarone drip.  That she is close to end-stage disease. --Cardiology/heart failure team following, appreciate assistance --Amiodarone drip --IV Lasix now on hold due to rising BUN/creatinine --Strict I's and O's and daily weights  Staph capitis bacteremia Blood cultures from 05/28/2020 + for Staphylococcus capitis.  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  Patient was initially started on Ancef, followed by vancomycin.  Now deescalated back to Ancef based on culture identification and susceptibilities.  Repeat blood cultures 05/29/2020 with no growth x5 days.  TEE 3/22 with no signs of endocarditis.  IR removed right chest port on 06/04/2020. --Infectious disease recommends to continue Ancef for 7 days following port removal (can change to Zyvox if able to dc prior to the 7 days) --Continue Ancef  Acute renal failure on CKD stage IIIb Creatinine on admission 1.49, trended up to a high of 2.53 on 05/30/2020; likely secondary to aggressive IV diuresis. --Cr 1.49>>2.53>>1.62>1.82>2.02 (basline 1.4-1.6) --cardiology holding further diuresis for now  Hyperkalemia Potassium 5.3, receiving Lokelma 5 g p.o. x1 today. --Repeat BMP in the a.m. 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 --CMP in am  Gout Left foot x-ray with no fracture or dislocation, no evident arthropathy with small inferior calcaneal spur. --Continue prednisone taper; complete on 06/07/2020  Type 2 diabetes mellitus The 5.2 on 05/28/2020, well controlled.  Home regimen includes Jardiance 25 mg p.o. twice daily, Lantus 30 units subcutaneously nightly, NovoLog 5 units 3 times  daily AC, Metformin 1000 mg p.o. twice daily --Increase Lantus to 30 units [place]  daily --NovoLog 5 units [place] TIDAC --Moderate dose SSI for further coverage --CBG's qAC/HS  Sickle cell anemia Chronic pain --Hydroxyurea 1000 mg daily --Dilaudid 2 mg p.o. q4h prn severe pain   DVT prophylaxis: Heparin   Code Status: Full Code Family Communication: No family present at bedside this morning  Disposition Plan:  Level of care: Progressive Status is: Inpatient  Remains inpatient appropriate because:Ongoing diagnostic testing needed not appropriate for outpatient work up, Unsafe d/c plan, IV treatments appropriate due to intensity of illness or inability to take PO and Inpatient level of care appropriate due to severity of illness   Dispo: The patient is from: Home              Anticipated d/c is to: Home              Patient currently is not medically stable to d/c.   Difficult to place patient No  Consultants:   PCCM  Infectious disease  Cardiology/heart failure team  Procedures:   TEE  Antimicrobials:   Vancomycin 3/19 - 3/21  Ancef 3/18 - 3/19, 3/21   Subjective: Patient seen and examined bedside, resting comfortably.  Therapy present.  No specific complaints this morning.  [birth year] birthday.  No issues following port removal yesterday.    Denies headache, no fever/chills/night sweats, no nausea/vomiting/diarrhea, no chest pain, no palpitations, no abdominal pain.  No acute events overnight per nursing staff.  Objective: Vitals:   06/05/20 0714 06/05/20 0800 06/05/20 0811 06/05/20 0908  BP: 102/66     Pulse:  87  (!) 105  Resp: (!) 22 20    Temp:   98.1 F (36.7 C)   TempSrc:   Oral   SpO2:  100%  99%  Weight:      Height:        Intake/Output Summary (Last 24 hours) at 06/05/2020 1059 Last data filed at 06/05/2020 0800 Gross per 24 hour  Intake 1138.55 ml  Output 500 ml  Net 638.55 ml   Filed Weights   06/03/20 0500 06/04/20 0118 06/05/20 0500  Weight: 67 kg 67.1 kg 68.7 kg    Examination:  General exam: Appears calm and  comfortable  Respiratory system: Clear to auscultation. Respiratory effort normal.  On room air with SPO2 96% Cardiovascular system: S1 & S2 heard, RRR. No JVD, murmurs, rubs, gallops or clicks. No pedal edema. Gastrointestinal system: Abdomen is nondistended, soft and nontender. No organomegaly or masses felt. Normal bowel sounds heard. Central nervous system: Alert and oriented. No focal neurological deficits. Extremities: Symmetric 5 x 5 power. Skin: No rashes, lesions or ulcers Psychiatry: Judgement and insight appear normal. Mood & affect appropriate.     Data Reviewed: I have personally reviewed following labs and imaging studies  CBC: Recent Labs  Lab 06/01/20 0500 06/02/20 0413 06/03/20 0024 06/04/20 0104 06/05/20 0543  WBC 14.2* 15.4* 15.7* 16.9* 15.7*  NEUTROABS 9.1* 10.4* 12.4* 13.2* 12.4*  HGB 7.4* 8.0* 8.9* 8.5* 8.5*  HCT 19.6* 21.7* 23.4* 23.2* 22.5*  MCV 112.6* 115.4* 112.5* 114.9* 114.2*  PLT 201 225 191 275 270   Basic Metabolic Panel: Recent Labs  Lab 06/01/20 1017 06/02/20 0413 06/03/20 0024 06/03/20 1451 06/04/20 0104 06/04/20 1157 06/05/20 0543  NA 131*   < > 128* 130* 127* 130* 129*  K 4.5   < > 5.3* 5.5* 5.6* 5.0 5.3*  CL 104   < >  102 104 102 104 102  CO2 18*   < > 18* 19* 16* 17* 18*  GLUCOSE 144*   < > 382* 213* 378* 152* 283*  BUN 62*   < > 68* 67* 72* 72* 70*  CREATININE 1.84*   < > 1.84* 1.62* 1.82* 1.80* 2.02*  CALCIUM 8.2*   < > 8.6* 8.8* 8.6* 8.6* 8.7*  MG 2.2  --   --   --   --   --  2.4   < > = values in this interval not displayed.   GFR: Estimated Creatinine Clearance: 33.4 mL/min (A) (by C-G formula based on SCr of 2.02 mg/dL (H)). Liver Function Tests: Recent Labs  Lab 05/29/20 1129 05/31/20 0351 06/01/20 0500 06/02/20 0413 06/05/20 0543  AST 96* 69* 59* 68* 61*  ALT 82* 67* 37 34 15  ALKPHOS 106 102 105 127* 134*  BILITOT 6.4* 5.9* 5.0* 4.8* 3.5*  PROT 8.8* 8.4* 8.6* 8.7* 9.3*  ALBUMIN 2.7* 2.5* 2.5* 2.5* 2.6*    No results for input(s): LIPASE, AMYLASE in the last 168 hours. No results for input(s): AMMONIA in the last 168 hours. Coagulation Profile: No results for input(s): INR, PROTIME in the last 168 hours. Cardiac Enzymes: No results for input(s): CKTOTAL, CKMB, CKMBINDEX, TROPONINI in the last 168 hours. BNP (last 3 results) No results for input(s): PROBNP in the last 8760 hours. HbA1C: No results for input(s): HGBA1C in the last 72 hours. CBG: Recent Labs  Lab 06/04/20 1550 06/04/20 2013 06/04/20 2216 06/05/20 0109 06/05/20 0619  GLUCAP 195* 390* 449* 397* 271*   Lipid Profile: No results for input(s): CHOL, HDL, LDLCALC, TRIG, CHOLHDL, LDLDIRECT in the last 72 hours. Thyroid Function Tests: No results for input(s): TSH, T4TOTAL, FREET4, T3FREE, THYROIDAB in the last 72 hours. Anemia Panel: No results for input(s): VITAMINB12, FOLATE, FERRITIN, TIBC, IRON, RETICCTPCT in the last 72 hours. Sepsis Labs: No results for input(s): PROCALCITON, LATICACIDVEN in the last 168 hours.  Recent Results (from the past 240 hour(s))  Resp Panel by RT-PCR (Flu A&B, Covid) Nasopharyngeal Swab     Status: None   Collection Time: 05/27/20  4:26 PM   Specimen: Nasopharyngeal Swab; Nasopharyngeal(NP) swabs in vial transport medium  Result Value Ref Range Status   SARS Coronavirus 2 by RT PCR NEGATIVE NEGATIVE Final    Comment: (NOTE) SARS-CoV-2 target nucleic acids are NOT DETECTED.  The SARS-CoV-2 RNA is generally detectable in upper respiratory specimens during the acute phase of infection. The lowest concentration of SARS-CoV-2 viral copies this assay can detect is 138 copies/mL. A negative result does not preclude SARS-Cov-2 infection and should not be used as the sole basis for treatment or other patient management decisions. A negative result may occur with  improper specimen collection/handling, submission of specimen other than nasopharyngeal swab, presence of viral mutation(s)  within the areas targeted by this assay, and inadequate number of viral copies(<138 copies/mL). A negative result must be combined with clinical observations, patient history, and epidemiological information. The expected result is Negative.  Fact Sheet for Patients:  BloggerCourse.com  Fact Sheet for Healthcare Providers:  SeriousBroker.it  This test is no t yet approved or cleared by the [place] FDA and  has been authorized for detection and/or diagnosis of SARS-CoV-2 by FDA under an Emergency Use Authorization (EUA). This EUA will remain  in effect (meaning this test can be used) for the duration of the COVID-19 declaration under Section 564(b)(1) of the Act, 21 U.S.C.section 360bbb-3(b)(1), unless the  authorization is terminated  or revoked sooner.       Influenza A by PCR NEGATIVE NEGATIVE Final   Influenza B by PCR NEGATIVE NEGATIVE Final    Comment: (NOTE) The Xpert Xpress SARS-CoV-2/FLU/RSV plus assay is intended as an aid in the diagnosis of influenza from Nasopharyngeal swab specimens and should not be used as a sole basis for treatment. Nasal washings and aspirates are unacceptable for Xpert Xpress SARS-CoV-2/FLU/RSV testing.  Fact Sheet for Patients: BloggerCourse.com  Fact Sheet for Healthcare Providers: SeriousBroker.it  This test is not yet approved or cleared by the [place] FDA and has been authorized for detection and/or diagnosis of SARS-CoV-2 by FDA under an Emergency Use Authorization (EUA). This EUA will remain in effect (meaning this test can be used) for the duration of the COVID-19 declaration under Section 564(b)(1) of the Act, 21 U.S.C. section 360bbb-3(b)(1), unless the authorization is terminated or revoked.  Performed at [facility], [address] [place], [place] [address]   Culture, blood (routine x 2)     Status:  Abnormal   Collection Time: 05/28/20 10:45 PM   Specimen: BLOOD RIGHT HAND  Result Value Ref Range Status   Specimen Description BLOOD RIGHT HAND  Final   Special Requests   Final    BOTTLES DRAWN AEROBIC ONLY Blood Culture adequate volume   Culture  Setup Time   Final    GRAM POSITIVE COCCI IN CLUSTERS AEROBIC BOTTLE ONLY CRITICAL VALUE NOTED.  VALUE IS CONSISTENT WITH PREVIOUSLY REPORTED AND CALLED VALUE. Performed at [facility], [address], [place], [place] [address]    Culture STAPHYLOCOCCUS CAPITIS (A)  Final   Report Status 06/03/2020 FINAL  Final   Organism ID, Bacteria STAPHYLOCOCCUS CAPITIS  Final      Susceptibility   Staphylococcus capitis - MIC*    CIPROFLOXACIN <=0.5 SENSITIVE Sensitive     ERYTHROMYCIN >=8 RESISTANT Resistant     GENTAMICIN <=0.5 SENSITIVE Sensitive     OXACILLIN <=0.25 SENSITIVE Sensitive     TETRACYCLINE <=1 SENSITIVE Sensitive     VANCOMYCIN <=0.5 SENSITIVE Sensitive     TRIMETH/SULFA <=10 SENSITIVE Sensitive     CLINDAMYCIN <=0.25 SENSITIVE Sensitive     RIFAMPIN <=0.5 SENSITIVE Sensitive     Inducible Clindamycin NEGATIVE Sensitive     * STAPHYLOCOCCUS CAPITIS  Culture, blood (routine x 2)     Status: Abnormal   Collection Time: 05/28/20 10:45 PM   Specimen: BLOOD LEFT HAND  Result Value Ref Range Status   Specimen Description BLOOD LEFT HAND  Final   Special Requests   Final    BOTTLES DRAWN AEROBIC ONLY Blood Culture results may not be optimal due to an inadequate volume of blood received in culture bottles   Culture  Setup Time   Final    GRAM POSITIVE COCCI IN CLUSTERS AEROBIC BOTTLE ONLY Organism ID to follow CRITICAL RESULT CALLED TO, READ BACK BY AND VERIFIED WITH: C. AMEND PHARMD, AT 0550 05/30/20 BY D VANHOOK Performed at [facility], [address] [place], [place] [address]    Culture STAPHYLOCOCCUS CAPITIS (A)  Final   Report Status 06/01/2020 FINAL  Final   Organism ID, Bacteria STAPHYLOCOCCUS CAPITIS   Final      Susceptibility   Staphylococcus capitis - MIC*    CIPROFLOXACIN <=0.5 SENSITIVE Sensitive     ERYTHROMYCIN >=8 RESISTANT Resistant     GENTAMICIN <=0.5 SENSITIVE Sensitive     OXACILLIN <=0.25 SENSITIVE Sensitive  TETRACYCLINE <=1 SENSITIVE Sensitive     VANCOMYCIN <=0.5 SENSITIVE Sensitive     TRIMETH/SULFA <=10 SENSITIVE Sensitive     CLINDAMYCIN <=0.25 SENSITIVE Sensitive     RIFAMPIN <=0.5 SENSITIVE Sensitive     Inducible Clindamycin NEGATIVE Sensitive     * STAPHYLOCOCCUS CAPITIS  Blood Culture ID Panel (Reflexed)     Status: Abnormal   Collection Time: 05/28/20 10:45 PM  Result Value Ref Range Status   Enterococcus faecalis NOT DETECTED NOT DETECTED Final   Enterococcus Faecium NOT DETECTED NOT DETECTED Final   Listeria monocytogenes NOT DETECTED NOT DETECTED Final   Staphylococcus species DETECTED (A) NOT DETECTED Final    Comment: CRITICAL RESULT CALLED TO, READ BACK BY AND VERIFIED WITH: C. AMEND PHARMD, AT 0550 05/30/20 BY D VANHOOK    Staphylococcus aureus (BCID) NOT DETECTED NOT DETECTED Final   Staphylococcus epidermidis NOT DETECTED NOT DETECTED Final   Staphylococcus lugdunensis NOT DETECTED NOT DETECTED Final   Streptococcus species NOT DETECTED NOT DETECTED Final   Streptococcus agalactiae NOT DETECTED NOT DETECTED Final   Streptococcus pneumoniae NOT DETECTED NOT DETECTED Final   Streptococcus pyogenes NOT DETECTED NOT DETECTED Final   A.calcoaceticus-baumannii NOT DETECTED NOT DETECTED Final   Bacteroides fragilis NOT DETECTED NOT DETECTED Final   Enterobacterales NOT DETECTED NOT DETECTED Final   Enterobacter cloacae complex NOT DETECTED NOT DETECTED Final   Escherichia coli NOT DETECTED NOT DETECTED Final   Klebsiella aerogenes NOT DETECTED NOT DETECTED Final   Klebsiella oxytoca NOT DETECTED NOT DETECTED Final   Klebsiella pneumoniae NOT DETECTED NOT DETECTED Final   Proteus species NOT DETECTED NOT DETECTED Final   Salmonella species NOT  DETECTED NOT DETECTED Final   Serratia marcescens NOT DETECTED NOT DETECTED Final   Haemophilus influenzae NOT DETECTED NOT DETECTED Final   Neisseria meningitidis NOT DETECTED NOT DETECTED Final   Pseudomonas aeruginosa NOT DETECTED NOT DETECTED Final   Stenotrophomonas maltophilia NOT DETECTED NOT DETECTED Final   Candida albicans NOT DETECTED NOT DETECTED Final   Candida auris NOT DETECTED NOT DETECTED Final   Candida glabrata NOT DETECTED NOT DETECTED Final   Candida krusei NOT DETECTED NOT DETECTED Final   Candida parapsilosis NOT DETECTED NOT DETECTED Final   Candida tropicalis NOT DETECTED NOT DETECTED Final   Cryptococcus neoformans/gattii NOT DETECTED NOT DETECTED Final    Comment: Performed at [facility], [address] [place], [place] [address]  Culture, blood (routine x 2)     Status: None   Collection Time: 05/29/20  2:38 PM   Specimen: BLOOD LEFT HAND  Result Value Ref Range Status   Specimen Description BLOOD LEFT HAND  Final   Special Requests   Final    BOTTLES DRAWN AEROBIC AND ANAEROBIC Blood Culture results may not be optimal due to an inadequate volume of blood received in culture bottles   Culture   Final    NO GROWTH 5 DAYS Performed at [facility], [address] [place], [place] [address]    Report Status 06/03/2020 FINAL  Final  Culture, blood (routine x 2)     Status: None   Collection Time: 05/29/20  2:46 PM   Specimen: BLOOD RIGHT HAND  Result Value Ref Range Status   Specimen Description BLOOD RIGHT HAND  Final   Special Requests   Final    BOTTLES DRAWN AEROBIC ONLY Blood Culture results may not be optimal due to an inadequate volume of blood received in culture bottles   Culture  Final    NO GROWTH 5 DAYS Performed at [facility], [address] [place], [place] [address]    Report Status 06/03/2020 FINAL  Final  Culture, Urine     Status: Abnormal   Collection Time: 05/30/20  5:41 AM   Specimen: Urine, Catheterized   Result Value Ref Range Status   Specimen Description URINE, CATHETERIZED  Final   Special Requests NONE  Final   Culture (A)  Final    <10,000 COLONIES/mL INSIGNIFICANT GROWTH Performed at [facility], [address] [place], [place] [address]    Report Status 05/31/2020 FINAL  Final  MRSA PCR Screening     Status: None   Collection Time: 06/01/20  8:40 PM   Specimen: Nasopharyngeal  Result Value Ref Range Status   MRSA by PCR NEGATIVE NEGATIVE Final    Comment:        The GeneXpert MRSA Assay (FDA approved for NASAL specimens only), is one component of a comprehensive MRSA colonization surveillance program. It is not intended to diagnose MRSA infection nor to guide or monitor treatment for MRSA infections. Performed at [facility], [address] [place], [place] [address]          Radiology Studies: IR REMOVAL TUN ACCESS W/ PORT W/O FL MOD SED  Result Date: 06/04/2020 CLINICAL DATA:  Bacteremia and need to remove indwelling Port-A-Cath due to potential seeding. EXAM: REMOVAL OF IMPLANTED TUNNELED PORT-A-CATH MEDICATIONS: 2 mg IV Versed PROCEDURE: The right chest Port-A-Cath site was prepped with chlorhexidine. A sterile gown and gloves were worn during the procedure. Local anesthesia was provided with 1% lidocaine. An incision was made overlying the Port-A-Cath with a #15 scalpel. Utilizing sharp and blunt dissection, the Port-A-Cath was removed. Portable cautery was utilized. Retention sutures were removed. The pocked was irrigated with sterile saline. Wound closure was performed with subcutaneous 3-0 Monocryl, subcuticular 4-0 Vicryl and Dermabond. The entire Port-A-Cath was removed successfully. The port pocket is clean on inspection and did not appear infected. IMPRESSION: Removal of implanted Port-A-Cath utilizing sharp and blunt dissection. The procedure was uncomplicated. Electronically Signed   By: Irish Lack M.D.   On: 06/04/2020 15:56         Scheduled Meds: . Chlorhexidine Gluconate Cloth  6 each Topical Daily  . enoxaparin (LOVENOX) injection  30 mg Subcutaneous Q24H  . hydroxyurea  1,000 mg Oral Daily  . insulin aspart  0-15 Units Subcutaneous TID WC  . insulin aspart  0-5 Units Subcutaneous QHS  . insulin aspart  5 Units Subcutaneous TID WC  . insulin glargine  30 Units Subcutaneous QHS  . multivitamin with minerals  1 tablet Oral Daily  . predniSONE  10 mg Oral Q breakfast  . [START ON 06/07/2020] predniSONE  5 mg Oral Q breakfast  . sodium chloride flush  10-40 mL Intracatheter Q12H   Continuous Infusions: . sodium chloride 10 mL/hr at 06/05/20 0800  . amiodarone 30 mg/hr (06/05/20 0800)  .  ceFAZolin (ANCEF) IV Stopped (06/05/20 0707)     LOS: 9 days    Time spent: 39 minutes spent on chart review, discussion with nursing staff, consultants, updating family and interview/physical exam; more than 50% of that time was spent in counseling and/or coordination of care.    Alvira Philips [place], DO [facility] Available via Epic secure chat 7am-7pm After these hours, please refer to coverage provider listed on [email or web address] 06/05/2020, 10:59 AM

## 2020-06-05 NOTE — Plan of Care (Signed)

## 2020-06-05 NOTE — Progress Notes (Signed)
Physical Therapy Treatment Patient Details Name: Pamela Hudson MRN: [id number] DOB: 04-06-1985 Today's Date: 06/05/2020    History of Present Illness 35 y.o. female presenting to [facility] ED on 05/27/2020 with LE swelling and SOB, concerned for volume overload with CHf exacerbation. Gout flare acutely. 3/20 SVT with recurrence 3/22. 3/23 port removed. PMH includes cardiomegaly, CHF, chronic pain, CVA, HTN, PCOS, sickle cell anemia, DM2, chronic right heart strain, pulmonary Hypertension, CKD stage 3, HTN, transient A.fib COVID 2020.    PT Comments    Pt pleasant reporting being up since 4am and tired on her birthday. Pt able to progress gait distance with continued need for RW this session but not yet up to attempting stairs for return home. Encouraged OOB to chair for meals and walking to bathroom for toileting. Will continue to follow to reach independence and stairs prior to return home.   HR 90-105 SpO2 99% on RA    Follow Up Recommendations  No PT follow up     Equipment Recommendations  None recommended by PT    Recommendations for Other Services       Precautions / Restrictions Precautions Precautions: Fall Precaution Comments: left great toe gout pain Restrictions Weight Bearing Restrictions: No    Mobility  Bed Mobility Overal bed mobility: Needs Assistance Bed Mobility: Supine to Sit;Sit to Supine     Supine to sit: Modified independent (Device/Increase time) Sit to supine: Min assist   General bed mobility comments: mod I to transition to sitting with HOB 15 degrees, min assist to fully clear legs and position in midline with return to bed    Transfers Overall transfer level: Modified independent                  Ambulation/Gait Ambulation/Gait assistance: Supervision Gait Distance (Feet): 400 Feet Assistive device: Rolling walker (2 wheeled) Gait Pattern/deviations: Step-through pattern;Decreased stride length;Decreased stance time - left   Gait  velocity interpretation: 1.31 - 2.62 ft/sec, indicative of limited community ambulator General Gait Details: initial 8' without RW with increased sway and decreased stance on left, transitioned to RW with improved stability with cues for posture and proximity to [employer]    Modified Rankin (Stroke Patients Only)       Balance Overall balance assessment: Mild deficits observed, not formally tested                                          Cognition Arousal/Alertness: Awake/alert Behavior During Therapy: WFL for tasks assessed/performed Overall Cognitive Status: Within Functional Limits for tasks assessed                                        Exercises      General Comments        Pertinent Vitals/Pain Pain Score: 4  Pain Location: left great toe Pain Descriptors / Indicators: Aching;Guarding;Constant Pain Intervention(s): Limited activity within patient's tolerance;Repositioned;Monitored during session    Home Living                      Prior Function            PT Goals (current goals can now be found in the care  plan section) Progress towards PT goals: Progressing toward goals    Frequency    Min 3X/week      PT Plan Current plan remains appropriate    Co-evaluation              AM-PAC PT "6 Clicks" Mobility   Outcome Measure  Help needed turning from your back to your side while in a flat bed without using bedrails?: None Help needed moving from lying on your back to sitting on the side of a flat bed without using bedrails?: None Help needed moving to and from a bed to a chair (including a wheelchair)?: A Little Help needed standing up from a chair using your arms (e.g., wheelchair or bedside chair)?: A Little Help needed to walk in hospital room?: A Little Help needed climbing 3-5 steps with a railing? : A Little 6 Click Score: 20    End of Session    Activity Tolerance: Patient tolerated treatment well Patient left: in bed;with call bell/phone within reach Nurse Communication: Mobility status PT Visit Diagnosis: Other abnormalities of gait and mobility (R26.89);Pain Pain - Right/Left: Left Pain - part of body: Ankle and joints of foot     Time: 5427-0623 PT Time Calculation (min) (ACUTE ONLY): 20 min  Charges:  $Gait Training: 8-22 mins                     [name], PT Acute Rehabilitation Services Pager: [phone] Office: [phone]    [name] B [name] 06/05/2020, 9:11 AM

## 2020-06-05 NOTE — Progress Notes (Addendum)
Advanced Heart Failure Rounding Note  PCP-Cardiologist: No primary care provider on file.   Subjective:    - 3/20 Developed hemodynamically unable SVT, HR in the 190s. Failed vagal maneuvers, adenosine x 4, converted on amio gtt. Moved to CCU.  Milrinone stopped for hypotension. Started on Neo. - 3/22 had recurrent SVT restarted on amio gtt  Being treated for staph capitus bacteremia. ID following. Abx changed Ancef. AF. WBC trending down 17>>15K. Rt chest port removed 3/23.    TEE 3/22 negative for endocarditis but did show new onset LV dysfunction on top of RV dysfunction and severe TR. LVEF now 30-35%.   No further SVT overnight. NSR HR 80s.   SCr continues to trend up 1.62>1.82>>2.02. Only 500 cc charted yesterday but I/Os inaccurate. Wt up 4 lb. No dyspnea.   K 5.3 Na 129   Today is her 35rd Birthday.     Objective:   Weight Range: 68.7 kg Body mass index is 30.59 kg/m.   Vital Signs:   Temp:  [97.6 F (36.4 C)-98.6 F (37 C)] 98 F (36.7 C) (03/24 0622) Pulse Rate:  [25-89] 83 (03/24 0400) Resp:  [17-26] 18 (03/24 0400) BP: (90-106)/(61-76) 99/64 (03/24 0400) SpO2:  [83 %-100 %] 99 % (03/24 0400) Weight:  [68.7 kg] 68.7 kg (03/24 0500) Last BM Date: 06/04/20  Weight change: Filed Weights   06/03/20 0500 06/04/20 0118 06/05/20 0500  Weight: 67 kg 67.1 kg 68.7 kg    Intake/Output:   Intake/Output Summary (Last 24 hours) at 06/05/2020 0747 Last data filed at 06/05/2020 0700 Gross per 24 hour  Intake 1199.74 ml  Output 500 ml  Net 699.74 ml      Physical Exam    PHYSICAL EXAM: General:   Mildly fatigue appearing. No respiratory difficulty HEENT: normal Neck: supple. Distended neck veins JVD elevated to ear. Carotids 2+ bilat; no bruits. No lymphadenopathy or thyromegaly appreciated. Cor: PMI nondisplaced. Regular rate & rhythm. 3/6 TR murmur  Lungs: faint crackles RLL that improve w/ cough. No wheezing  Abdomen: soft, nontender, nondistended.  No hepatosplenomegaly. No bruits or masses. Good bowel sounds. Extremities: no cyanosis, clubbing, rash, edema Neuro: alert & oriented x 3, cranial nerves grossly intact. moves all 4 extremities w/o difficulty. Affect pleasant.   Telemetry   NSR 80s  No further SVT  EKG    No new EKG to review   Labs    CBC Recent Labs    06/04/20 0104 06/05/20 0543  WBC 16.9* 15.7*  NEUTROABS 13.2* PENDING  HGB 8.5* 8.5*  HCT 23.2* 22.5*  MCV 114.9* 114.2*  PLT 275 270   Basic Metabolic Panel Recent Labs    27/25/36 1157 06/05/20 0543  NA 130* 129*  K 5.0 5.3*  CL 104 102  CO2 17* 18*  GLUCOSE 152* 283*  BUN 72* 70*  CREATININE 1.80* 2.02*  CALCIUM 8.6* 8.7*  MG  --  2.4   Liver Function Tests Recent Labs    06/05/20 0543  AST 61*  ALT 15  ALKPHOS 134*  BILITOT 3.5*  PROT 9.3*  ALBUMIN 2.6*   No results for input(s): LIPASE, AMYLASE in the last 72 hours. Cardiac Enzymes No results for input(s): CKTOTAL, CKMB, CKMBINDEX, TROPONINI in the last 72 hours.  BNP: BNP (last 3 results) Recent Labs    05/27/20 1733 05/29/20 0615  BNP 1,228.5* 3,195.8*    ProBNP (last 3 results) No results for input(s): PROBNP in the last 8760 hours.   D-Dimer No  results for input(s): DDIMER in the last 72 hours. Hemoglobin A1C No results for input(s): HGBA1C in the last 72 hours. Fasting Lipid Panel No results for input(s): CHOL, HDL, LDLCALC, TRIG, CHOLHDL, LDLDIRECT in the last 72 hours. Thyroid Function Tests No results for input(s): TSH, T4TOTAL, T3FREE, THYROIDAB in the last 72 hours.  Invalid input(s): FREET3  Other results:   Imaging    IR REMOVAL TUN ACCESS W/ PORT W/O FL MOD SED  Result Date: 06/04/2020 CLINICAL DATA:  Bacteremia and need to remove indwelling Port-A-Cath due to potential seeding. EXAM: REMOVAL OF IMPLANTED TUNNELED PORT-A-CATH MEDICATIONS: 2 mg IV Versed PROCEDURE: The right chest Port-A-Cath site was prepped with chlorhexidine. A sterile gown  and gloves were worn during the procedure. Local anesthesia was provided with 1% lidocaine. An incision was made overlying the Port-A-Cath with a #15 scalpel. Utilizing sharp and blunt dissection, the Port-A-Cath was removed. Portable cautery was utilized. Retention sutures were removed. The pocked was irrigated with sterile saline. Wound closure was performed with subcutaneous 3-0 Monocryl, subcuticular 4-0 Vicryl and Dermabond. The entire Port-A-Cath was removed successfully. The port pocket is clean on inspection and did not appear infected. IMPRESSION: Removal of implanted Port-A-Cath utilizing sharp and blunt dissection. The procedure was uncomplicated. Electronically Signed   By: Irish Lack M.D.   On: 06/04/2020 15:56     Medications:     Scheduled Medications: . Chlorhexidine Gluconate Cloth  6 each Topical Daily  . enoxaparin (LOVENOX) injection  30 mg Subcutaneous Q24H  . hydroxyurea  1,000 mg Oral Daily  . insulin aspart  0-15 Units Subcutaneous TID WC  . insulin aspart  0-5 Units Subcutaneous QHS  . insulin aspart  5 Units Subcutaneous TID WC  . insulin glargine  10 Units Subcutaneous Once  . insulin glargine  30 Units Subcutaneous QHS  . multivitamin with minerals  1 tablet Oral Daily  . predniSONE  10 mg Oral Q breakfast  . [START ON 06/07/2020] predniSONE  5 mg Oral Q breakfast  . sodium chloride flush  10-40 mL Intracatheter Q12H    Infusions: . sodium chloride Stopped (06/05/20 0636)  . amiodarone 30 mg/hr (06/05/20 0700)  .  ceFAZolin (ANCEF) IV 200 mL/hr at 06/05/20 0700    PRN Medications: sodium chloride, acetaminophen, guaiFENesin-dextromethorphan, heparin lock flush, heparin lock flush, HYDROmorphone, ondansetron (ZOFRAN) IV, ondansetron (ZOFRAN) IV, sodium chloride flush   Assessment/Plan    1. 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 A/C Systolic/Diastolic HF w/ Predominate RV  Failure - Echo 45-50%, RV severely reduced, RVSP 34   - RHC w/ PCWP 16, preserved CO, low PAPi 0.9  - Milrinone added 8/18 for RV support, Stopped 3/20 for SVT and hypotension - TEE 3/22 showed EF 30-35% and moderate to severe RV dysfunction - Not candidate for restarting inotrope support due to severe SVT w/ profound hypotension  - BP too soft for GDMT - no dig yet w/ rising SCr  - Try to gently diuresis w/ IV Lasix, follow BMP  - she is end-stage, options as outlined above very limited   3. AKI on CKD  - Creatinine on admit 1.5-->2.5 - Had improved but Scr trending back up, 1.62>1.82>>2.02 - Continue gentle diuresis w/ IV Lasix   4. SVT  - now off milrinone - back on amio gtt w/ recurrence 3/22 - maintaining NSR - correct hyperkalemia  - monitor on tele  5. Sickle Cell Anemia  - per primary team   6. TR - Severe on echo - likely functional from severely dilated RV  - ? Primary leaflet pathology such as carcinoid involvement  - TEE 3/22 negative for endocarditis   7. Hyperkalemia - 5.3 today  - Give Lolkelma 10 mg x1. F/u BMP at noon   8. Bacteremia - BCs 3/16 + for Staph capitus - BCs 3/17 NGTD - WBC 17>>15K. AF  - on abx, Ancef. ID following  - TEE negative for endocarditis  - Rt Chest Port removed 3/23   9. Acute Gout - Lt Great toe - colchicine on hold w/ AKI  - Improving w/ prednisone. Cont through 3/26   10. Hyponatremia - Na 129, menation ok - monitor  11. Type 2DM - glucose running high in 300s - on insulin, adjustments per primary team    Length of Stay: [address], PA-C  06/05/2020, 7:47 AM  Advanced Heart Failure Team Pager [phone] (M-F; 7a - 5p)  Please contact [facility] for night-coverage after hours (4p -7a ) and weekends on [email or web address]   Patient seen and examined with the above-signed Advanced Practice Provider and/or Housestaff. I personally reviewed laboratory data, imaging studies and relevant notes. I  independently examined the patient and formulated the important aspects of the plan. I have edited the note to reflect any of my changes or salient points. I have personally discussed the plan with the patient and/or family.  Port removed yesterday. Remains on IV abx. Weight and CVP climbing. Creatinine worse.   General:  Sitting up on side of bed . No resp difficulty HEENT: normal Neck: supple. JVP to jaw Carotids 2+ bilat; no bruits. No lymphadenopathy or thryomegaly appreciated. Cor: PMI nondisplaced. Regular rate & rhythm. 2/6 TR Lungs: clear Abdomen: soft, nontender, nondistended. No hepatosplenomegaly. No bruits or masses. Good bowel sounds. Extremities: no cyanosis, clubbing, rash, 1-2+ edema Neuro: alert & orientedx3, cranial nerves grossly intact. moves all 4 extremities w/o difficulty. Affect pleasant  Continues to struggle with RV failure and cardiorenal syndrome. No further AF on IV amio. Agree with continued attempts at diuresis. Wil not restart milrinone given severe SVT. Options limited.   Arvilla Meres, MD  4:18 PM

## 2020-06-06 DIAGNOSIS — R7881 Bacteremia: Secondary | ICD-10-CM | POA: Diagnosis not present

## 2020-06-06 LAB — BASIC METABOLIC PANEL
Anion gap: 8 (ref 5–15)
BUN: 77 mg/dL — ABNORMAL HIGH (ref 6–20)
CO2: 22 mmol/L (ref 22–32)
Calcium: 8.5 mg/dL — ABNORMAL LOW (ref 8.9–10.3)
Chloride: 101 mmol/L (ref 98–111)
Creatinine, Ser: 2.1 mg/dL — ABNORMAL HIGH (ref 0.44–1.00)
GFR, Estimated: 31 mL/min — ABNORMAL LOW (ref >60)
Glucose, Bld: 269 mg/dL — ABNORMAL HIGH (ref 70–99)
Potassium: 4.8 mmol/L (ref 3.5–5.1)
Sodium: 131 mmol/L — ABNORMAL LOW (ref 135–145)

## 2020-06-06 LAB — CBC
HCT: 23.4 % — ABNORMAL LOW (ref 36.0–46.0)
Hemoglobin: 8.7 g/dL — ABNORMAL LOW (ref 12.0–15.0)
MCH: 43.1 pg — ABNORMAL HIGH (ref 26.0–34.0)
MCHC: 37.2 g/dL — ABNORMAL HIGH (ref 30.0–36.0)
MCV: 115.8 fL — ABNORMAL HIGH (ref 80.0–100.0)
Platelets: 283 10*3/uL (ref 150–400)
RBC: 2.02 MIL/uL — ABNORMAL LOW (ref 3.87–5.11)
RDW: 20.6 % — ABNORMAL HIGH (ref 11.5–15.5)
WBC: 14.9 10*3/uL — ABNORMAL HIGH (ref 4.0–10.5)
nRBC: 7.1 % — ABNORMAL HIGH (ref 0.0–0.2)

## 2020-06-06 LAB — GLUCOSE, CAPILLARY
Glucose-Capillary: 276 mg/dL — ABNORMAL HIGH (ref 70–99)
Glucose-Capillary: 237 mg/dL — ABNORMAL HIGH (ref 70–99)
Glucose-Capillary: 194 mg/dL — ABNORMAL HIGH (ref 70–99)
Glucose-Capillary: 157 mg/dL — ABNORMAL HIGH (ref 70–99)
Glucose-Capillary: 175 mg/dL — ABNORMAL HIGH (ref 70–99)

## 2020-06-06 MED ORDER — AMIODARONE HCL 200 MG PO TABS
200.0000 mg | ORAL_TABLET | Freq: Two times a day (BID) | ORAL | Status: DC
  Administered 2020-06-06 – 2020-06-10 (×9): 200 mg via ORAL
  Filled 2020-06-06 (×9): qty 1

## 2020-06-06 MED ORDER — INSULIN GLARGINE 100 UNIT/ML ~~LOC~~ SOLN
10.0000 [IU] | Freq: Once | SUBCUTANEOUS | Status: AC
  Administered 2020-06-06: 10 [IU] via SUBCUTANEOUS
  Filled 2020-06-06 (×2): qty 0.1

## 2020-06-06 MED ORDER — INSULIN GLARGINE 100 UNIT/ML ~~LOC~~ SOLN
40.0000 [IU] | Freq: Every day | SUBCUTANEOUS | Status: DC
  Administered 2020-06-06: 40 [IU] via SUBCUTANEOUS
  Filled 2020-06-06 (×2): qty 0.4

## 2020-06-06 MED ORDER — DICLOFENAC SODIUM 1 % EX GEL
2.0000 g | Freq: Four times a day (QID) | CUTANEOUS | Status: DC | PRN
  Filled 2020-06-06: qty 100

## 2020-06-06 MED ORDER — FUROSEMIDE 10 MG/ML IJ SOLN
80.0000 mg | Freq: Once | INTRAMUSCULAR | Status: AC
  Administered 2020-06-06: 80 mg via INTRAVENOUS
  Filled 2020-06-06: qty 8

## 2020-06-06 MED ORDER — METOLAZONE 2.5 MG PO TABS
2.5000 mg | ORAL_TABLET | Freq: Once | ORAL | Status: AC
  Administered 2020-06-06: 2.5 mg via ORAL
  Filled 2020-06-06: qty 1

## 2020-06-06 MED ORDER — FUROSEMIDE 10 MG/ML IJ SOLN
80.0000 mg | Freq: Two times a day (BID) | INTRAMUSCULAR | Status: DC
  Administered 2020-06-06 – 2020-06-09 (×7): 80 mg via INTRAVENOUS
  Filled 2020-06-06 (×8): qty 8

## 2020-06-06 MED ORDER — INSULIN ASPART 100 UNIT/ML ~~LOC~~ SOLN
10.0000 [IU] | Freq: Three times a day (TID) | SUBCUTANEOUS | Status: DC
  Administered 2020-06-06 (×3): 10 [IU] via SUBCUTANEOUS

## 2020-06-06 NOTE — Progress Notes (Signed)
PROGRESS NOTE    Pamela Hudson  ZOX:[id number] DOB: February 03, 1986 DOA: 05/27/2020 PCP: Judd LienWebb, Meghan H, PA-C    Brief Narrative:  Pamela Hudson is a 35 year old female with past medical history significant for sickle cell anemia, pulmonary hypertension, CKD stage IIIb, type 2 diabetes mellitus, CVA, PCOS, Hx COVID 2020 who presented to the ED with progressive shortness of breath, lower extremity edema reported 5 pound weight gain over the previous 3 days.  Patient was admitted for acute on chronic systolic/diastolic congestive heart failure exacerbation with associated pulmonary hypertension and was found to have staph capitis bacteremia.  Patient underwent right heart catheterization with findings of mild PAH, severe RV failure with preserved cardiac output with no evidence of intracardiac shunt.  On 05/30/2020 was started on a milrinone drip for assistance with aggressive diuresis per cardiology.  On 06/01/2020, patient developed SVT while on milrinone drip and was transreferred to the intensive care unit.  Milrinone drip was discontinued and patient was started on amiodarone infusion.  [facility].   Assessment & Plan:   Principal Problem:   Bacteremia Active Problems:   Sickle cell anemia (HCC)   Elevated bilirubin   Leukocytosis   Dyspnea   Acute on chronic right-sided heart failure (HCC)   Chronic right-sided heart failure (HCC)   Pulmonary hypertension (HCC)   CKD (chronic kidney disease)   DM2 (diabetes mellitus, type 2) (HCC)   SVT (supraventricular tachycardia) (HCC)   Biventricular heart failure Severe tricuspid regurgitation with core pulmonale SVT Patient presenting to the ED with progressive shortness of breath and lower extremity edema. TTE with LVEF 45-50%, RV severely reduced, RVSP 34.  Underwent right heart catheterization on 05/29/2020 with mild PAH but with severe RV failure.  Patient was started on IV  diuresis with milrinone drip support, but subsequently developed SVT in which she was transferred to the intensive care unit.  Milrinone was discontinued and patient was started on amiodarone drip.  That she is close to end-stage disease. --Cardiology/heart failure team following, appreciate assistance --continues on Amiodarone drip --IV Lasix 80mg  x 1 yesterday --Strict I's and O's and daily weights; not accurate on chart review  Staph capitis bacteremia Blood cultures from 05/28/2020 + for Staphylococcus capitis.  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  Patient was initially started on Ancef, followed by vancomycin.  Now deescalated back to Ancef based on culture identification and susceptibilities.  Repeat blood cultures 05/29/2020 with no growth x5 days.  TEE 3/22 with no signs of endocarditis.  IR removed right chest port on 06/04/2020. --Infectious disease recommends to continue Ancef for 7 days following port removal (can change to Zyvox if able to dc prior to the 7 days) --Continue Ancef (End 06/11/2020)  Acute renal failure on CKD stage IIIb Creatinine on admission 1.49, trended up to a high of 2.53 on 05/30/2020; likely secondary to aggressive IV diuresis. --Cr 1.49>>2.53>>1.62>1.82>2.02>2.10 (basline 1.4-1.6) --Further diuresis per cardiology  Hyperkalemia Potassium 8 this morning, receivedLokelma 5 g p.o. x1 yesterday --Repeat BMP in the a.m. 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 --CMP in am  Gout Left foot x-ray with no fracture or dislocation, no evident arthropathy with small inferior calcaneal spur. --Continue prednisone taper; complete on 06/07/2020  Type 2 diabetes mellitus The 5.2 on 05/28/2020, well controlled.  Home regimen includes Jardiance 25 mg p.o. twice daily, Lantus 30 units subcutaneously nightly,  NovoLog 5 units 3 times daily AC, Metformin 1000 mg p.o. twice  daily --Increase Lantus to 40 units [place] daily --Increase NovoLog 10 units [place] TIDAC --Moderate dose SSI for further coverage --CBG's qAC/HS  Sickle cell anemia Chronic pain --Hydroxyurea 1000 mg daily --Dilaudid 2 mg p.o. q4h prn severe pain   DVT prophylaxis: Heparin   Code Status: Full Code Family Communication: No family present at bedside this morning  Disposition Plan:  Level of care: Progressive Status is: Inpatient  Remains inpatient appropriate because:Ongoing diagnostic testing needed not appropriate for outpatient work up, Unsafe d/c plan, IV treatments appropriate due to intensity of illness or inability to take PO and Inpatient level of care appropriate due to severity of illness   Dispo: The patient is from: Home              Anticipated d/c is to: Home              Patient currently is not medically stable to d/c.   Difficult to place patient No  Consultants:   PCCM  Infectious disease -now signed off  Cardiology/heart failure team  Procedures:   TEE  Antimicrobials:   Vancomycin 3/19 - 3/21  Ancef 3/18 - 3/19, 3/21   Subjective: Patient seen and examined bedside, resting comfortably.  No specific complaints this morning.  Denies chest pain, no shortness of breath.  No acute events overnight per nursing staff.  Objective: Vitals:   06/05/20 1948 06/05/20 2356 06/06/20 0326 06/06/20 0818  BP: 103/69 103/63 107/74 106/81  Pulse: 85 92 80 87  Resp: Temp: 98.9 F (37.2 C) 98.1 F (36.7 C) 98.3 F (36.8 C) 97.7 F (36.5 C)  TempSrc: Oral Oral Oral Oral  SpO2: 100% 97% 96% 97%  Weight:   68.9 kg   Height:        Intake/Output Summary (Last 24 hours) at 06/06/2020 1049 Last data filed at 06/06/2020 0400 Gross per 24 hour  Intake 646.97 ml  Output 925 ml  Net -278.03 ml   Filed Weights   06/04/20 0118 06/05/20 0500 06/06/20 0326  Weight: 67.1 kg 68.7 kg 68.9 kg    Examination:  General exam: Appears calm and comfortable   Respiratory system: Clear to auscultation. Respiratory effort normal.  On room air with SPO2 96% Cardiovascular system: S1 & S2 heard, RRR. No JVD, murmurs, rubs, gallops or clicks. No pedal edema. Gastrointestinal system: Abdomen is nondistended, soft and nontender. No organomegaly or masses felt. Normal bowel sounds heard. Central nervous system: Alert and oriented. No focal neurological deficits. Extremities: Symmetric 5 x 5 power. Skin: No rashes, lesions or ulcers Psychiatry: Judgement and insight appear normal. Mood & affect appropriate.     Data Reviewed: I have personally reviewed following labs and imaging studies  CBC: Recent Labs  Lab 06/01/20 0500 06/02/20 0413 06/03/20 0024 06/04/20 0104 06/05/20 0543  WBC 14.2* 15.4* 15.7* 16.9* 15.7*  NEUTROABS 9.1* 10.4* 12.4* 13.2* 12.4*  HGB 7.4* 8.0* 8.9* 8.5* 8.5*  HCT 19.6* 21.7* 23.4* 23.2* 22.5*  MCV 112.6* 115.4* 112.5* 114.9* 114.2*  PLT 201 225 191 275 270   Basic Metabolic Panel: Recent Labs  Lab 06/01/20 1017 06/02/20 0413 06/03/20 1451 06/04/20 0104 06/04/20 1157 06/05/20 0543 06/06/20 0112  NA 131*   < > 130* 127* 130* 129* 131*  K 4.5   < > 5.5* 5.6* 5.0 5.3* 4.8  CL 104   < > 104 102 104 102 101  CO2  18*   < > 19* 16* 17* 18* 22  GLUCOSE 144*   < > 213* 378* 152* 283* 269*  BUN 62*   < > 67* 72* 72* 70* 77*  CREATININE 1.84*   < > 1.62* 1.82* 1.80* 2.02* 2.10*  CALCIUM 8.2*   < > 8.8* 8.6* 8.6* 8.7* 8.5*  MG 2.2  --   --   --   --  2.4  --    < > = values in this interval not displayed.   GFR: Estimated Creatinine Clearance: 32.2 mL/min (A) (by C-G formula based on SCr of 2.1 mg/dL (H)). Liver Function Tests: Recent Labs  Lab 05/31/20 0351 06/01/20 0500 06/02/20 0413 06/05/20 0543  AST 69* 59* 68* 61*  ALT 67* 37 34 15  ALKPHOS 102 105 127* 134*  BILITOT 5.9* 5.0* 4.8* 3.5*  PROT 8.4* 8.6* 8.7* 9.3*  ALBUMIN 2.5* 2.5* 2.5* 2.6*   No results for input(s): LIPASE, AMYLASE in the last 168  hours. No results for input(s): AMMONIA in the last 168 hours. Coagulation Profile: No results for input(s): INR, PROTIME in the last 168 hours. Cardiac Enzymes: No results for input(s): CKTOTAL, CKMB, CKMBINDEX, TROPONINI in the last 168 hours. BNP (last 3 results) No results for input(s): PROBNP in the last 8760 hours. HbA1C: No results for input(s): HGBA1C in the last 72 hours. CBG: Recent Labs  Lab 06/05/20 1553 06/05/20 2027 06/05/20 2350 06/06/20 0318 06/06/20 0609  GLUCAP 212* 209* 285* 276* 237*   Lipid Profile: No results for input(s): CHOL, HDL, LDLCALC, TRIG, CHOLHDL, LDLDIRECT in the last 72 hours. Thyroid Function Tests: No results for input(s): TSH, T4TOTAL, FREET4, T3FREE, THYROIDAB in the last 72 hours. Anemia Panel: No results for input(s): VITAMINB12, FOLATE, FERRITIN, TIBC, IRON, RETICCTPCT in the last 72 hours. Sepsis Labs: No results for input(s): PROCALCITON, LATICACIDVEN in the last 168 hours.  Recent Results (from the past 240 hour(s))  Resp Panel by RT-PCR (Flu A&B, Covid) Nasopharyngeal Swab     Status: None   Collection Time: 05/27/20  4:26 PM   Specimen: Nasopharyngeal Swab; Nasopharyngeal(NP) swabs in vial transport medium  Result Value Ref Range Status   SARS Coronavirus 2 by RT PCR NEGATIVE NEGATIVE Final    Comment: (NOTE) SARS-CoV-2 target nucleic acids are NOT DETECTED.  The SARS-CoV-2 RNA is generally detectable in upper respiratory specimens during the acute phase of infection. The lowest concentration of SARS-CoV-2 viral copies this assay can detect is 138 copies/mL. A negative result does not preclude SARS-Cov-2 infection and should not be used as the sole basis for treatment or other patient management decisions. A negative result may occur with  improper specimen collection/handling, submission of specimen other than nasopharyngeal swab, presence of viral mutation(s) within the areas targeted by this assay, and inadequate number of  viral copies(<138 copies/mL). A negative result must be combined with clinical observations, patient history, and epidemiological information. The expected result is Negative.  Fact Sheet for Patients:  BloggerCourse.com  Fact Sheet for Healthcare Providers:  SeriousBroker.it  This test is no t yet approved or cleared by the [place] FDA and  has been authorized for detection and/or diagnosis of SARS-CoV-2 by FDA under an Emergency Use Authorization (EUA). This EUA will remain  in effect (meaning this test can be used) for the duration of the COVID-19 declaration under Section 564(b)(1) of the Act, 21 U.S.C.section 360bbb-3(b)(1), unless the authorization is terminated  or revoked sooner.       Influenza A  by PCR NEGATIVE NEGATIVE Final   Influenza B by PCR NEGATIVE NEGATIVE Final    Comment: (NOTE) The Xpert Xpress SARS-CoV-2/FLU/RSV plus assay is intended as an aid in the diagnosis of influenza from Nasopharyngeal swab specimens and should not be used as a sole basis for treatment. Nasal washings and aspirates are unacceptable for Xpert Xpress SARS-CoV-2/FLU/RSV testing.  Fact Sheet for Patients: BloggerCourse.com  Fact Sheet for Healthcare Providers: SeriousBroker.it  This test is not yet approved or cleared by the [place] FDA and has been authorized for detection and/or diagnosis of SARS-CoV-2 by FDA under an Emergency Use Authorization (EUA). This EUA will remain in effect (meaning this test can be used) for the duration of the COVID-19 declaration under Section 564(b)(1) of the Act, 21 U.S.C. section 360bbb-3(b)(1), unless the authorization is terminated or revoked.  Performed at [facility], [address] [place], [place] [address]   Culture, blood (routine x 2)     Status: Abnormal   Collection Time: 05/28/20 10:45 PM   Specimen: BLOOD  RIGHT HAND  Result Value Ref Range Status   Specimen Description BLOOD RIGHT HAND  Final   Special Requests   Final    BOTTLES DRAWN AEROBIC ONLY Blood Culture adequate volume   Culture  Setup Time   Final    GRAM POSITIVE COCCI IN CLUSTERS AEROBIC BOTTLE ONLY CRITICAL VALUE NOTED.  VALUE IS CONSISTENT WITH PREVIOUSLY REPORTED AND CALLED VALUE. Performed at [facility], [address], [place], [place] [address]    Culture STAPHYLOCOCCUS CAPITIS (A)  Final   Report Status 06/03/2020 FINAL  Final   Organism ID, Bacteria STAPHYLOCOCCUS CAPITIS  Final      Susceptibility   Staphylococcus capitis - MIC*    CIPROFLOXACIN <=0.5 SENSITIVE Sensitive     ERYTHROMYCIN >=8 RESISTANT Resistant     GENTAMICIN <=0.5 SENSITIVE Sensitive     OXACILLIN <=0.25 SENSITIVE Sensitive     TETRACYCLINE <=1 SENSITIVE Sensitive     VANCOMYCIN <=0.5 SENSITIVE Sensitive     TRIMETH/SULFA <=10 SENSITIVE Sensitive     CLINDAMYCIN <=0.25 SENSITIVE Sensitive     RIFAMPIN <=0.5 SENSITIVE Sensitive     Inducible Clindamycin NEGATIVE Sensitive     * STAPHYLOCOCCUS CAPITIS  Culture, blood (routine x 2)     Status: Abnormal   Collection Time: 05/28/20 10:45 PM   Specimen: BLOOD LEFT HAND  Result Value Ref Range Status   Specimen Description BLOOD LEFT HAND  Final   Special Requests   Final    BOTTLES DRAWN AEROBIC ONLY Blood Culture results may not be optimal due to an inadequate volume of blood received in culture bottles   Culture  Setup Time   Final    GRAM POSITIVE COCCI IN CLUSTERS AEROBIC BOTTLE ONLY Organism ID to follow CRITICAL RESULT CALLED TO, READ BACK BY AND VERIFIED WITH: C. AMEND PHARMD, AT 0550 05/30/20 BY D VANHOOK Performed at [facility], [address] [place], [place] [address]    Culture STAPHYLOCOCCUS CAPITIS (A)  Final   Report Status 06/01/2020 FINAL  Final   Organism ID, Bacteria STAPHYLOCOCCUS CAPITIS  Final      Susceptibility   Staphylococcus capitis - MIC*     CIPROFLOXACIN <=0.5 SENSITIVE Sensitive     ERYTHROMYCIN >=8 RESISTANT Resistant     GENTAMICIN <=0.5 SENSITIVE Sensitive     OXACILLIN <=0.25 SENSITIVE Sensitive     TETRACYCLINE <=1 SENSITIVE Sensitive     VANCOMYCIN <=0.5 SENSITIVE  Sensitive     TRIMETH/SULFA <=10 SENSITIVE Sensitive     CLINDAMYCIN <=0.25 SENSITIVE Sensitive     RIFAMPIN <=0.5 SENSITIVE Sensitive     Inducible Clindamycin NEGATIVE Sensitive     * STAPHYLOCOCCUS CAPITIS  Blood Culture ID Panel (Reflexed)     Status: Abnormal   Collection Time: 05/28/20 10:45 PM  Result Value Ref Range Status   Enterococcus faecalis NOT DETECTED NOT DETECTED Final   Enterococcus Faecium NOT DETECTED NOT DETECTED Final   Listeria monocytogenes NOT DETECTED NOT DETECTED Final   Staphylococcus species DETECTED (A) NOT DETECTED Final    Comment: CRITICAL RESULT CALLED TO, READ BACK BY AND VERIFIED WITH: C. AMEND PHARMD, AT 0550 05/30/20 BY D VANHOOK    Staphylococcus aureus (BCID) NOT DETECTED NOT DETECTED Final   Staphylococcus epidermidis NOT DETECTED NOT DETECTED Final   Staphylococcus lugdunensis NOT DETECTED NOT DETECTED Final   Streptococcus species NOT DETECTED NOT DETECTED Final   Streptococcus agalactiae NOT DETECTED NOT DETECTED Final   Streptococcus pneumoniae NOT DETECTED NOT DETECTED Final   Streptococcus pyogenes NOT DETECTED NOT DETECTED Final   A.calcoaceticus-baumannii NOT DETECTED NOT DETECTED Final   Bacteroides fragilis NOT DETECTED NOT DETECTED Final   Enterobacterales NOT DETECTED NOT DETECTED Final   Enterobacter cloacae complex NOT DETECTED NOT DETECTED Final   Escherichia coli NOT DETECTED NOT DETECTED Final   Klebsiella aerogenes NOT DETECTED NOT DETECTED Final   Klebsiella oxytoca NOT DETECTED NOT DETECTED Final   Klebsiella pneumoniae NOT DETECTED NOT DETECTED Final   Proteus species NOT DETECTED NOT DETECTED Final   Salmonella species NOT DETECTED NOT DETECTED Final   Serratia marcescens NOT DETECTED  NOT DETECTED Final   Haemophilus influenzae NOT DETECTED NOT DETECTED Final   Neisseria meningitidis NOT DETECTED NOT DETECTED Final   Pseudomonas aeruginosa NOT DETECTED NOT DETECTED Final   Stenotrophomonas maltophilia NOT DETECTED NOT DETECTED Final   Candida albicans NOT DETECTED NOT DETECTED Final   Candida auris NOT DETECTED NOT DETECTED Final   Candida glabrata NOT DETECTED NOT DETECTED Final   Candida krusei NOT DETECTED NOT DETECTED Final   Candida parapsilosis NOT DETECTED NOT DETECTED Final   Candida tropicalis NOT DETECTED NOT DETECTED Final   Cryptococcus neoformans/gattii NOT DETECTED NOT DETECTED Final    Comment: Performed at [facility], [address], [place], [place] [address]  Culture, blood (routine x 2)     Status: None   Collection Time: 05/29/20  2:38 PM   Specimen: BLOOD LEFT HAND  Result Value Ref Range Status   Specimen Description BLOOD LEFT HAND  Final   Special Requests   Final    BOTTLES DRAWN AEROBIC AND ANAEROBIC Blood Culture results may not be optimal due to an inadequate volume of blood received in culture bottles   Culture   Final    NO GROWTH 5 DAYS Performed at [facility], [address], [place], [place] [address]    Report Status 06/03/2020 FINAL  Final  Culture, blood (routine x 2)     Status: None   Collection Time: 05/29/20  2:46 PM   Specimen: BLOOD RIGHT HAND  Result Value Ref Range Status   Specimen Description BLOOD RIGHT HAND  Final   Special Requests   Final    BOTTLES DRAWN AEROBIC ONLY Blood Culture results may not be optimal due to an inadequate volume of blood received in culture bottles   Culture   Final    NO GROWTH 5 DAYS Performed  at [facility], [address], [place], [place] [address]    Report Status 06/03/2020 FINAL  Final  Culture, Urine     Status: Abnormal   Collection Time: 05/30/20  5:41 AM   Specimen: Urine, Catheterized  Result Value Ref Range Status   Specimen Description URINE,  CATHETERIZED  Final   Special Requests NONE  Final   Culture (A)  Final    <10,000 COLONIES/mL INSIGNIFICANT GROWTH Performed at [facility], [address] [place], [place] [address]    Report Status 05/31/2020 FINAL  Final  MRSA PCR Screening     Status: None   Collection Time: 06/01/20  8:40 PM   Specimen: Nasopharyngeal  Result Value Ref Range Status   MRSA by PCR NEGATIVE NEGATIVE Final    Comment:        The GeneXpert MRSA Assay (FDA approved for NASAL specimens only), is one component of a comprehensive MRSA colonization surveillance program. It is not intended to diagnose MRSA infection nor to guide or monitor treatment for MRSA infections. Performed at [facility], [address], [place], [place] [address]   Culture, blood (Routine X 2) w Reflex to ID Panel     Status: None (Preliminary result)   Collection Time: 06/05/20 10:08 AM   Specimen: Left Antecubital; Blood  Result Value Ref Range Status   Specimen Description LEFT ANTECUBITAL  Final   Special Requests   Final    BOTTLES DRAWN AEROBIC AND ANAEROBIC Blood Culture adequate volume   Culture   Final    NO GROWTH < 24 HOURS Performed at [facility], [address], [place], [place] [address]    Report Status PENDING  Incomplete  Culture, blood (Routine X 2) w Reflex to ID Panel     Status: None (Preliminary result)   Collection Time: 06/05/20 10:24 AM   Specimen: BLOOD RIGHT HAND  Result Value Ref Range Status   Specimen Description BLOOD RIGHT HAND  Final   Special Requests   Final    BOTTLES DRAWN AEROBIC AND ANAEROBIC Blood Culture adequate volume   Culture   Final    NO GROWTH < 24 HOURS Performed at [facility], [address] [place], [place] [address]    Report Status PENDING  Incomplete         Radiology Studies: IR REMOVAL TUN ACCESS W/ PORT W/O FL MOD SED  Result Date: 06/04/2020 CLINICAL DATA:  Bacteremia and need to remove indwelling Port-A-Cath due to  potential seeding. EXAM: REMOVAL OF IMPLANTED TUNNELED PORT-A-CATH MEDICATIONS: 2 mg IV Versed PROCEDURE: The right chest Port-A-Cath site was prepped with chlorhexidine. A sterile gown and gloves were worn during the procedure. Local anesthesia was provided with 1% lidocaine. An incision was made overlying the Port-A-Cath with a #15 scalpel. Utilizing sharp and blunt dissection, the Port-A-Cath was removed. Portable cautery was utilized. Retention sutures were removed. The pocked was irrigated with sterile saline. Wound closure was performed with subcutaneous 3-0 Monocryl, subcuticular 4-0 Vicryl and Dermabond. The entire Port-A-Cath was removed successfully. The port pocket is clean on inspection and did not appear infected. IMPRESSION: Removal of implanted Port-A-Cath utilizing sharp and blunt dissection. The procedure was uncomplicated. Electronically Signed   By: Irish Lack M.D.   On: 06/04/2020 15:56        Scheduled Meds: . amiodarone  200 mg Oral BID  . Chlorhexidine Gluconate Cloth  6 each Topical Daily  . enoxaparin (LOVENOX)  injection  30 mg Subcutaneous Q24H  . hydroxyurea  1,000 mg Oral Daily  . insulin aspart  0-15 Units Subcutaneous TID WC  . insulin aspart  0-5 Units Subcutaneous QHS  . insulin aspart  10 Units Subcutaneous TID WC  . insulin glargine  40 Units Subcutaneous QHS  . multivitamin with minerals  1 tablet Oral Daily  . [START ON 06/07/2020] predniSONE  5 mg Oral Q breakfast  . sodium chloride flush  10-40 mL Intracatheter Q12H   Continuous Infusions: . sodium chloride Stopped (06/06/20 0036)  .  ceFAZolin (ANCEF) IV 2 g (06/06/20 0631)     LOS: 10 days    Time spent: 37 minutes spent on chart review, discussion with nursing staff, consultants, updating family and interview/physical exam; more than 50% of that time was spent in counseling and/or coordination of care.    Alvira Philips [place], DO [facility] Available via Epic secure chat 7am-7pm After  these hours, please refer to coverage provider listed on [email or web address] 06/06/2020, 10:49 AM

## 2020-06-06 NOTE — Progress Notes (Signed)
Pt c/o of a nagging headache around 0041. Tylenol was given. Around 0200 pt started to c/o numbness that started in lips and has radiated to left cheek. BP 102/68 T 98.6 R 21 SpO2 96% on room air, no complaints of SOB. On call [facility] MD Opyd paged. MD advised to wait for labs to process and continue to monitor pt.

## 2020-06-06 NOTE — Progress Notes (Addendum)
Advanced Heart Failure Rounding Note  PCP-Cardiologist: No primary care provider on file.   Subjective:   -3/17 RHC Mild PAH RA 23 PA 35/14 (26) PCWP 16 CO 6.9 CI 5.8  - 3/20 Developed hemodynamically unable SVT, HR in the 190s. Failed vagal maneuvers, adenosine x 4, converted on amio gtt. Moved to CCU.  Milrinone stopped for hypotension. Started on Neo. - 3/22 had recurrent SVT restarted on amio gtt -3/24 give 80 mg IV lasix x1. I/O not accurate.   Being treated for staph capitus bacteremia. ID following. Abx changed Ancef. AF.   Rt chest port removed 3/23.   TEE 3/22 negative for endocarditis but did show new onset LV dysfunction on top of RV dysfunction and severe TR. LVEF now 30-35%.   No further SVT   SCr continues to trend up 1.62>1.82>>2.02>>2.1    Says she feels like she has fluid in her breast. Still has pain in her great toe.   Objective:   Weight Range: 68.9 kg Body mass index is 30.68 kg/m.   Vital Signs:   Temp:  [97.3 F (36.3 C)-98.9 F (37.2 C)] 97.7 F (36.5 C) (03/25 0818) Pulse Rate:  [80-92] 87 (03/25 0818) Resp:  [0-26] 17 (03/25 0818) BP: (103-111)/(63-81) 106/81 (03/25 0818) SpO2:  [96 %-100 %] 97 % (03/25 0818) Weight:  [68.9 kg] 68.9 kg (03/25 0326) Last BM Date: 06/05/20  Weight change: Filed Weights   06/04/20 0118 06/05/20 0500 06/06/20 0326  Weight: 67.1 kg 68.7 kg 68.9 kg    Intake/Output:   Intake/Output Summary (Last 24 hours) at 06/06/2020 0954 Last data filed at 06/06/2020 0400 Gross per 24 hour  Intake 673.65 ml  Output 925 ml  Net -251.35 ml      Physical Exam   General:  Sitting on the side of the bed.  No resp difficulty HEENT: normal Neck: supple. JVP 9-10  Carotids 2+ bilat; no bruits. No lymphadenopathy or thryomegaly appreciated. Cor: PMI nondisplaced. Regular rate & rhythm. No rubs, gallops or murmurs. Lungs: LLL crackles on  2 liters.  Abdomen: soft, nontender, nondistended. No hepatosplenomegaly. No bruits  or masses. Good bowel sounds. Extremities: no cyanosis, clubbing, rash, edema Neuro: alert & orientedx3, cranial nerves grossly intact. moves all 4 extremities w/o difficulty. Affect pleasant    Telemetry   NSR 80s  No further SVT  EKG    No new EKG to review   Labs    CBC Recent Labs    06/04/20 0104 06/05/20 0543  WBC 16.9* 15.7*  NEUTROABS 13.2* 12.4*  HGB 8.5* 8.5*  HCT 23.2* 22.5*  MCV 114.9* 114.2*  PLT 275 270   Basic Metabolic Panel Recent Labs    89/38/10 0543 06/06/20 0112  NA 129* 131*  K 5.3* 4.8  CL 102 101  CO2 18* 22  GLUCOSE 283* 269*  BUN 70* 77*  CREATININE 2.02* 2.10*  CALCIUM 8.7* 8.5*  MG 2.4  --    Liver Function Tests Recent Labs    06/05/20 0543  AST 61*  ALT 15  ALKPHOS 134*  BILITOT 3.5*  PROT 9.3*  ALBUMIN 2.6*   No results for input(s): LIPASE, AMYLASE in the last 72 hours. Cardiac Enzymes No results for input(s): CKTOTAL, CKMB, CKMBINDEX, TROPONINI in the last 72 hours.  BNP: BNP (last 3 results) Recent Labs    05/27/20 1733 05/29/20 0615  BNP 1,228.5* 3,195.8*    ProBNP (last 3 results) No results for input(s): PROBNP in the last 8760 hours.  D-Dimer No results for input(s): DDIMER in the last 72 hours. Hemoglobin A1C No results for input(s): HGBA1C in the last 72 hours. Fasting Lipid Panel No results for input(s): CHOL, HDL, LDLCALC, TRIG, CHOLHDL, LDLDIRECT in the last 72 hours. Thyroid Function Tests No results for input(s): TSH, T4TOTAL, T3FREE, THYROIDAB in the last 72 hours.  Invalid input(s): FREET3  Other results:   Imaging    No results found.   Medications:     Scheduled Medications: . Chlorhexidine Gluconate Cloth  6 each Topical Daily  . enoxaparin (LOVENOX) injection  30 mg Subcutaneous Q24H  . hydroxyurea  1,000 mg Oral Daily  . insulin aspart  0-15 Units Subcutaneous TID WC  . insulin aspart  0-5 Units Subcutaneous QHS  . insulin aspart  10 Units Subcutaneous TID WC  .  insulin glargine  40 Units Subcutaneous QHS  . multivitamin with minerals  1 tablet Oral Daily  . [START ON 06/07/2020] predniSONE  5 mg Oral Q breakfast  . sodium chloride flush  10-40 mL Intracatheter Q12H    Infusions: . sodium chloride Stopped (06/06/20 0036)  . amiodarone 30 mg/hr (06/06/20 0945)  .  ceFAZolin (ANCEF) IV 2 g (06/06/20 0631)    PRN Medications: sodium chloride, acetaminophen, guaiFENesin-dextromethorphan, heparin lock flush, heparin lock flush, HYDROmorphone, ondansetron (ZOFRAN) IV, ondansetron (ZOFRAN) IV, sodium chloride flush   Assessment/Plan    1. 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 Renal function worsening. Will discuss with Dr Gala Romney additional IV lasix versus switching to torsemide.  - she is end-stage, options as outlined above very limited   3. AKI on CKD  - Creatinine on admit 1.5-->2.5 - Had improved but Scr trending back up, 1.62>1.82>>2.02>>2.1   4. SVT  - now off milrinone - back on amio gtt w/ recurrence 3/22  5. Sickle Cell Anemia  - per primary team   6. TR - Severe on echo - likely functional from severely dilated RV  - ? Primary leaflet pathology such as carcinoid involvement  - TEE 3/22 negative for endocarditis   7. Hyperkalemia - Stable today.   8. Bacteremia - BCs 3/16 + for Staph capitus - BCs 3/17 NGTD - WBC 17>>15K. AF  - on abx, Ancef. ID following  - TEE negative for  endocarditis  - Rt Chest Port removed 3/23   9. Acute Gout - Lt Great toe - colchicine on hold w/ AKI  - Improving w/ prednisone. Cont through 3/26   10. Hyponatremia - Na 129, menation ok - monitor  11. Type 2DM - glucose running high in 300s - on insulin, adjustments per primary team    Length of Stay: 10  Tonye Becket, NP  06/06/2020, 9:54 AM  Advanced Heart Failure Team Pager [phone] (M-F; 7a - 5p)  Please contact [facility] for night-coverage after hours (4p -7a ) and weekends on [email or web address]  Patient seen and examined  with the above-signed Advanced Practice Provider and/or Housestaff. I personally reviewed laboratory data, imaging studies and relevant notes. I independently examined the patient and formulated the important aspects of the plan. I have edited the note to reflect any of my changes or salient points. I have personally discussed the plan with the patient and/or family.  Port now out. Feels bloated. Says her breasts feel like they have fluid in them. No SOB, orthopnea or PND. Not diuresing well. Creatinine up slightly.   General:  Sitting up on side of bed. No resp difficulty HEENT: normal Neck: supple. JVP to jaw Carotids 2+ bilat; no bruits. No lymphadenopathy or thryomegaly appreciated. Cor: PMI nondisplaced. Regular rate & rhythm. 2/6 TR Lungs: clear Abdomen: soft, nontender, nondistended. No hepatosplenomegaly. No bruits or masses. Good bowel sounds. Extremities: no cyanosis, clubbing, rash, tr-1+ edema Neuro: alert & orientedx3, cranial nerves grossly intact. moves all 4 extremities w/o difficulty. Affect pleasant  Volume status remains elevated. Weight up > 10 pounds. Unable to use milrinone to support RV due to severe SVT. Will give IV lasix and metolazone today. I worry she is end-stage without a lot of options.    Arvilla Meres, MD  2:14 PM

## 2020-06-06 NOTE — Progress Notes (Signed)
Physical Therapy Treatment Patient Details Name: Pamela Hudson MRN: [id number] DOB: Jun 03, 1985 Today's Date: 06/06/2020    History of Present Illness 35 y.o. female presenting to [facility] ED on 05/27/2020 with LE swelling and SOB, concerned for volume overload with CHf exacerbation. Gout flare acutely. 3/20 SVT with recurrence 3/22. 3/23 port removed. PMH includes cardiomegaly, CHF, chronic pain, CVA, HTN, PCOS, sickle cell anemia, DM2, chronic right heart strain, pulmonary HTN, CKD stage 3, HTN, transient A.fib COVID 2020.    PT Comments    Pt received in supine, agreeable to limited therapy session in room with encouragement, primary session focus on stair/gait training. Pt reports fatigue after waking up early, but good tolerance for stair trial x7 steps with BUE support and Supervision, VSS throughout on RA. Pt Supervision to modI for functional mobility tasks with no AD, will plan to attempt longer hallway gait trial next date with and without AD to assess balance further. Pt continues to benefit from PT services to progress toward functional mobility goals. Anticipate pt safe to DC home once medically cleared.  Follow Up Recommendations  No PT follow up     Equipment Recommendations  None recommended by PT    Recommendations for Other Services       Precautions / Restrictions Precautions Precautions: Fall Precaution Comments: left great toe gout pain Restrictions Weight Bearing Restrictions: No    Mobility  Bed Mobility Overal bed mobility: Needs Assistance Bed Mobility: Supine to Sit;Sit to Supine     Supine to sit: Modified independent (Device/Increase time) Sit to supine: Supervision   General bed mobility comments: mod I to transition to sitting with HOB 15 degrees Supervision with flat HOB to supine    Transfers Overall transfer level: Modified independent     Sit to Stand: Modified independent (Device/Increase time)         General transfer comment: from  EOB  Ambulation/Gait Ambulation/Gait assistance: Supervision Gait Distance (Feet): 40 Feet Assistive device: None Gait Pattern/deviations: Step-through pattern;Decreased stride length;Decreased stance time - left (increased postural sway) Gait velocity: reduced Gait velocity interpretation: <1.31 ft/sec, indicative of household ambulator General Gait Details: pt deferring hallway ambulation but agreeable to mobility in room/stair training; no LOB without AD, increased postural sway but no overt LOB   Stairs Stairs: Yes Stairs assistance: Supervision Stair Management: One rail Right;Step to pattern Number of Stairs: 7 General stair comments: cues for sequencing, no LOB, with 7" step in room and RW to simulate B handrails; VSS   Wheelchair Mobility    Modified Rankin (Stroke Patients Only)       Balance Overall balance assessment: Mild deficits observed, not formally tested                                          Cognition Arousal/Alertness: Awake/alert Behavior During Therapy: WFL for tasks assessed/performed Overall Cognitive Status: Within Functional Limits for tasks assessed                                 General Comments: participatory, fatigued      Exercises      General Comments General comments (skin integrity, edema, etc.): BP 121/77 (92) supine after stairs/gait; HR 77 bpm and RR 18 with mobility      Pertinent Vitals/Pain Pain Assessment: Faces Faces Pain Scale: Hurts a little bit  Pain Location: breasts 2/2 edema Pain Descriptors / Indicators: Discomfort Pain Intervention(s): Monitored during session;Repositioned;Other (comment) (asked RN if anything available for her to keep breasts elevated for improved comfort)    Home Living                      Prior Function            PT Goals (current goals can now be found in the care plan section) Acute Rehab PT Goals Patient Stated Goal: return home PT Goal  Formulation: With patient Time For Goal Achievement: 06/17/20 Potential to Achieve Goals: Good Progress towards PT goals: Progressing toward goals    Frequency    Min 3X/week      PT Plan Current plan remains appropriate    Co-evaluation              AM-PAC PT "6 Clicks" Mobility   Outcome Measure  Help needed turning from your back to your side while in a flat bed without using bedrails?: None Help needed moving from lying on your back to sitting on the side of a flat bed without using bedrails?: None Help needed moving to and from a bed to a chair (including a wheelchair)?: None Help needed standing up from a chair using your arms (e.g., wheelchair or bedside chair)?: None Help needed to walk in hospital room?: A Little Help needed climbing 3-5 steps with a railing? : A Little 6 Click Score: 22    End of Session Equipment Utilized During Treatment: Gait belt Activity Tolerance: Patient tolerated treatment well Patient left: in bed;with call bell/phone within reach Nurse Communication: Mobility status PT Visit Diagnosis: Other abnormalities of gait and mobility (R26.89);Pain Pain - Right/Left: Left Pain - part of body: Ankle and joints of foot     Time: 1430-1443 PT Time Calculation (min) (ACUTE ONLY): 13 min  Charges:  $Gait Training: 8-22 mins                     Vetra Shinall P., PTA Acute Rehabilitation Services Pager: [phone] Office: [phone]   Angus Palms 06/06/2020, 3:10 PM

## 2020-06-07 DIAGNOSIS — R748 Abnormal levels of other serum enzymes: Secondary | ICD-10-CM

## 2020-06-07 DIAGNOSIS — I50813 Acute on chronic right heart failure: Secondary | ICD-10-CM | POA: Diagnosis not present

## 2020-06-07 DIAGNOSIS — D5700 Hb-SS disease with crisis, unspecified: Secondary | ICD-10-CM

## 2020-06-07 DIAGNOSIS — D72825 Bandemia: Secondary | ICD-10-CM

## 2020-06-07 DIAGNOSIS — R17 Unspecified jaundice: Secondary | ICD-10-CM | POA: Diagnosis not present

## 2020-06-07 DIAGNOSIS — I5043 Acute on chronic combined systolic (congestive) and diastolic (congestive) heart failure: Secondary | ICD-10-CM

## 2020-06-07 DIAGNOSIS — E1122 Type 2 diabetes mellitus with diabetic chronic kidney disease: Secondary | ICD-10-CM | POA: Diagnosis not present

## 2020-06-07 DIAGNOSIS — R7881 Bacteremia: Secondary | ICD-10-CM | POA: Diagnosis not present

## 2020-06-07 DIAGNOSIS — E871 Hypo-osmolality and hyponatremia: Secondary | ICD-10-CM

## 2020-06-07 LAB — BASIC METABOLIC PANEL
Anion gap: 8 (ref 5–15)
BUN: 76 mg/dL — ABNORMAL HIGH (ref 6–20)
CO2: 21 mmol/L — ABNORMAL LOW (ref 22–32)
Calcium: 8.9 mg/dL (ref 8.9–10.3)
Chloride: 104 mmol/L (ref 98–111)
Creatinine, Ser: 1.71 mg/dL — ABNORMAL HIGH (ref 0.44–1.00)
GFR, Estimated: 40 mL/min — ABNORMAL LOW (ref >60)
Glucose, Bld: 84 mg/dL (ref 70–99)
Potassium: 4.7 mmol/L (ref 3.5–5.1)
Sodium: 133 mmol/L — ABNORMAL LOW (ref 135–145)

## 2020-06-07 LAB — GLUCOSE, CAPILLARY
Glucose-Capillary: 111 mg/dL — ABNORMAL HIGH (ref 70–99)
Glucose-Capillary: 124 mg/dL — ABNORMAL HIGH (ref 70–99)
Glucose-Capillary: 133 mg/dL — ABNORMAL HIGH (ref 70–99)
Glucose-Capillary: 153 mg/dL — ABNORMAL HIGH (ref 70–99)
Glucose-Capillary: 156 mg/dL — ABNORMAL HIGH (ref 70–99)
Glucose-Capillary: 182 mg/dL — ABNORMAL HIGH (ref 70–99)
Glucose-Capillary: 194 mg/dL — ABNORMAL HIGH (ref 70–99)
Glucose-Capillary: 33 mg/dL — CL (ref 70–99)
Glucose-Capillary: 63 mg/dL — ABNORMAL LOW (ref 70–99)
Glucose-Capillary: 65 mg/dL — ABNORMAL LOW (ref 70–99)

## 2020-06-07 LAB — CBC
HCT: 23.1 % — ABNORMAL LOW (ref 36.0–46.0)
Hemoglobin: 8.5 g/dL — ABNORMAL LOW (ref 12.0–15.0)
MCH: 42.1 pg — ABNORMAL HIGH (ref 26.0–34.0)
MCHC: 36.8 g/dL — ABNORMAL HIGH (ref 30.0–36.0)
MCV: 114.4 fL — ABNORMAL HIGH (ref 80.0–100.0)
Platelets: 271 10*3/uL (ref 150–400)
RBC: 2.02 MIL/uL — ABNORMAL LOW (ref 3.87–5.11)
RDW: 20.7 % — ABNORMAL HIGH (ref 11.5–15.5)
WBC: 16.8 10*3/uL — ABNORMAL HIGH (ref 4.0–10.5)
nRBC: 5.1 % — ABNORMAL HIGH (ref 0.0–0.2)

## 2020-06-07 LAB — VITAMIN B12: Vitamin B-12: 467 pg/mL (ref 180–914)

## 2020-06-07 LAB — FOLATE: Folate: 11.9 ng/mL (ref >5.9)

## 2020-06-07 LAB — RETICULOCYTES
Immature Retic Fract: 26 % — ABNORMAL HIGH (ref 2.3–15.9)
RBC.: 2.03 MIL/uL — ABNORMAL LOW (ref 3.87–5.11)
Retic Count, Absolute: 145.5 10*3/uL (ref 19.0–186.0)
Retic Ct Pct: 7.5 % — ABNORMAL HIGH (ref 0.4–3.1)

## 2020-06-07 LAB — IRON AND TIBC
Iron: 133 ug/dL (ref 28–170)
Saturation Ratios: 87 % — ABNORMAL HIGH (ref 10.4–31.8)
TIBC: 153 ug/dL — ABNORMAL LOW (ref 250–450)
UIBC: 20 ug/dL

## 2020-06-07 LAB — FERRITIN: Ferritin: 6999 ng/mL — ABNORMAL HIGH (ref 11–307)

## 2020-06-07 LAB — CK: Total CK: 25 U/L — ABNORMAL LOW (ref 38–234)

## 2020-06-07 LAB — MAGNESIUM: Magnesium: 2.1 mg/dL (ref 1.7–2.4)

## 2020-06-07 MED ORDER — HYDROMORPHONE HCL 1 MG/ML IJ SOLN
1.0000 mg | INTRAMUSCULAR | Status: DC | PRN
Start: 2020-06-07 — End: 2020-06-11

## 2020-06-07 MED ORDER — HYDROMORPHONE HCL 2 MG PO TABS
2.0000 mg | ORAL_TABLET | ORAL | Status: DC | PRN
  Administered 2020-06-08 (×3): 2 mg via ORAL
  Filled 2020-06-07 (×3): qty 1

## 2020-06-07 MED ORDER — DEXTROSE 50 % IV SOLN
INTRAVENOUS | Status: AC
  Administered 2020-06-07: 25 mL via INTRAVENOUS
  Filled 2020-06-07: qty 50

## 2020-06-07 MED ORDER — DEXTROSE 50 % IV SOLN: 0.5000 | Freq: Once | INTRAVENOUS | Status: AC

## 2020-06-07 MED ORDER — INSULIN GLARGINE 100 UNIT/ML ~~LOC~~ SOLN
30.0000 [IU] | Freq: Every day | SUBCUTANEOUS | Status: DC
  Administered 2020-06-07: 30 [IU] via SUBCUTANEOUS
  Filled 2020-06-07 (×2): qty 0.3

## 2020-06-07 MED ORDER — VOXELOTOR 500 MG PO TABS
1500.0000 mg | ORAL_TABLET | Freq: Every day | ORAL | Status: DC
  Administered 2020-06-10 – 2020-06-11 (×2): 1500 mg via ORAL
  Filled 2020-06-07 (×3): qty 3

## 2020-06-07 MED ORDER — METOLAZONE 2.5 MG PO TABS
2.5000 mg | ORAL_TABLET | Freq: Once | ORAL | Status: AC
  Administered 2020-06-07: 2.5 mg via ORAL
  Filled 2020-06-07: qty 1

## 2020-06-07 MED ORDER — INSULIN ASPART 100 UNIT/ML ~~LOC~~ SOLN
6.0000 [IU] | Freq: Three times a day (TID) | SUBCUTANEOUS | Status: DC
  Administered 2020-06-10 – 2020-06-11 (×5): 6 [IU] via SUBCUTANEOUS

## 2020-06-07 NOTE — Progress Notes (Signed)
Patient ID: Pamela Hudson, female   DOB: Oct 24, 1985, 35 y.o.   MRN: [id number]     Advanced Heart Failure Rounding Note  PCP-Cardiologist: No primary care provider on file.   Subjective:   - 3/17 RHC Mild PAH RA 23 PA 35/14 (26) PCWP 16 CO 6.9 CI 5.8  - 3/20 Developed hemodynamically unable SVT, HR in the 190s. Failed vagal maneuvers, adenosine x 4, converted on amio gtt. Moved to CCU.  Milrinone stopped for hypotension. Started on Neo. - 3/22 had recurrent SVT restarted on amio gtt - 3/24 give 80 mg IV lasix x1. I/O not accurate.   Being treated for staph capitus bacteremia. ID following. Abx changed Ancef.  Afebrile.  Rt chest port removed 3/23.   TEE 3/22 negative for endocarditis but did show new onset LV dysfunction on top of RV dysfunction and severe TR. LVEF now 30-35%.   No further SVT, NSR 80s today.   SCr continues to trend up 1.62>1.82>>2.02>>2.1 >> 1.7  Patient diuresed well yesterday with IV Lasix and metolazone.  Weight down 4 lbs.   She reports pain in her legs bilaterally.  No dyspnea at rest.    Objective:   Weight Range: 67 kg Body mass index is 29.83 kg/m.   Vital Signs:   Temp:  [97.2 F (36.2 C)-98.4 F (36.9 C)] 97.4 F (36.3 C) (03/26 1029) Pulse Rate:  [83-92] 92 (03/26 1029) Resp:  [16-20] 20 (03/26 1029) BP: (96-120)/(62-83) 120/83 (03/26 1029) SpO2:  [96 %-98 %] 96 % (03/26 1029) Weight:  [67 kg] 67 kg (03/26 0607) Last BM Date: 06/06/20  Weight change: Filed Weights   06/05/20 0500 06/06/20 0326 06/07/20 0607  Weight: 68.7 kg 68.9 kg 67 kg    Intake/Output:   Intake/Output Summary (Last 24 hours) at 06/07/2020 1050 Last data filed at 06/07/2020 1000 Gross per 24 hour  Intake 819.17 ml  Output 4650 ml  Net -3830.83 ml      Physical Exam    General: NAD Neck: JVP 16 cm, no thyromegaly or thyroid nodule.  Lungs: Clear to auscultation bilaterally with normal respiratory effort. CV: Nondisplaced PMI.  Heart regular S1/S2, no S3/S4,  no murmur.  No peripheral edema.  Abdomen: Soft, nontender, no hepatosplenomegaly, no distention.  Skin: Intact without lesions or rashes.  Neurologic: Alert and oriented x 3.  Psych: Normal affect. Extremities: No clubbing or cyanosis.  HEENT: Normal.   Telemetry   NSR 80s  No further SVT (personally reviewed)  EKG    No new EKG to review   Labs    CBC Recent Labs    06/05/20 0543 06/06/20 1033 06/07/20 0051  WBC 15.7* 14.9* 16.8*  NEUTROABS 12.4*  --   --   HGB 8.5* 8.7* 8.5*  HCT 22.5* 23.4* 23.1*  MCV 114.2* 115.8* 114.4*  PLT 270 283 271   Basic Metabolic Panel Recent Labs    12/45/80 0543 06/06/20 0112 06/07/20 0051  NA 129* 131* 133*  K 5.3* 4.8 4.7  CL 102 101 104  CO2 18* 22 21*  GLUCOSE 283* 269* 84  BUN 70* 77* 76*  CREATININE 2.02* 2.10* 1.71*  CALCIUM 8.7* 8.5* 8.9  MG 2.4  --  2.1   Liver Function Tests Recent Labs    06/05/20 0543  AST 61*  ALT 15  ALKPHOS 134*  BILITOT 3.5*  PROT 9.3*  ALBUMIN 2.6*   No results for input(s): LIPASE, AMYLASE in the last 72 hours. Cardiac Enzymes No results for input(s): CKTOTAL,  CKMB, CKMBINDEX, TROPONINI in the last 72 hours.  BNP: BNP (last 3 results) Recent Labs    05/27/20 1733 05/29/20 0615  BNP 1,228.5* 3,195.8*    ProBNP (last 3 results) No results for input(s): PROBNP in the last 8760 hours.   D-Dimer No results for input(s): DDIMER in the last 72 hours. Hemoglobin A1C No results for input(s): HGBA1C in the last 72 hours. Fasting Lipid Panel No results for input(s): CHOL, HDL, LDLCALC, TRIG, CHOLHDL, LDLDIRECT in the last 72 hours. Thyroid Function Tests No results for input(s): TSH, T4TOTAL, T3FREE, THYROIDAB in the last 72 hours.  Invalid input(s): FREET3  Other results:   Imaging    No results found.   Medications:     Scheduled Medications: . amiodarone  200 mg Oral BID  . Chlorhexidine Gluconate Cloth  6 each Topical Daily  . enoxaparin (LOVENOX) injection   30 mg Subcutaneous Q24H  . furosemide  80 mg Intravenous BID  . hydroxyurea  1,000 mg Oral Daily  . insulin aspart  0-15 Units Subcutaneous TID WC  . insulin aspart  0-5 Units Subcutaneous QHS  . insulin aspart  6 Units Subcutaneous TID WC  . insulin glargine  30 Units Subcutaneous QHS  . multivitamin with minerals  1 tablet Oral Daily  . sodium chloride flush  10-40 mL Intracatheter Q12H    Infusions: . sodium chloride Stopped (06/06/20 0036)  .  ceFAZolin (ANCEF) IV 2 g (06/07/20 0528)    PRN Medications: sodium chloride, acetaminophen, diclofenac Sodium, guaiFENesin-dextromethorphan, heparin lock flush, heparin lock flush, HYDROmorphone, ondansetron (ZOFRAN) IV, sodium chloride flush   Assessment/Plan    1. 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   - No beta blocker with profound HF.  - no dig yet w/ elevated creatinine.  - She seemed to respond well to diuresis yesterday, weight down.  Still volume overloaded on exam.  Creatinine lower at 1.7.  I will repeat Lasix 80 mg IV bid with dose of metolazone 2.5 x 1.   - she is end-stage, options as outlined above very limited   3. AKI on CKD  - Creatinine on admit 1.5-->2.5 - Creatinine lower today with diuresis, 1.62>1.82>>2.02>>2.1>>1.7  4. SVT  - now off milrinone - back on amio gtt w/ recurrence 3/22 => transitioned to po today.   5. Sickle Cell Anemia  - per primary team   6. TR - Severe on echo - likely  functional from severely dilated RV  - ? Primary leaflet pathology such as carcinoid involvement  - TEE 3/22 negative for endocarditis   7. Hyperkalemia - Stable today.   8. Bacteremia - BCs 3/16 + for Staph capitus - BCs 3/17 NGTD - WBC 17>>15K. AF  - on abx, Ancef. ID following  - TEE negative for endocarditis  - Rt Chest Port removed 3/23   9. Acute Gout - Lt Great toe - colchicine on hold w/ AKI  - Improving w/ prednisone. Cont through  3/26 => stop today.   10. Hyponatremia - Na 133 today.  - monitor  11. Type 2DM - glucose running high in 300s - on insulin, adjustments per primary team    Length of Stay: 11  Marca Ancona, MD  06/07/2020, 10:50 AM  Advanced Heart Failure Team Pager [phone] (M-F; 7a - 5p)  Please contact [facility] for night-coverage after hours (4p -7a ) and weekends on [email or web address]

## 2020-06-07 NOTE — Progress Notes (Signed)
PROGRESS NOTE  Pamela GongDevon Lawrie NWG:[id number] DOB: 1985/07/28   PCP: Judd LienWebb, Meghan H, PA-C  Patient is from: Home  DOA: 05/27/2020 LOS: 11  Chief complaints: Shortness of breath, edema and weight gain  Brief Narrative / Interim history: 35 year old F with PMH of SCC, combined CHF, PAH, CKD-3B, DM-2, CVA, HTN, PCOS and COVID-19 infection 2020 presented to ED with progressive shortness of breath, edema and about 5 pound weight gain in 3 days, and admitted for acute on chronic combined CHF.   Patient underwent RHC that showed mild PAH, severe RVF with preserved CO and no evidence of intracardiac shunt.  She was a started on milrinone drip for assistance with aggressive diuresis on 3/18 but went into SVT on 3/20 requiring transfer to ICU.  Eventually, milrinone discontinued and she was a started on amiodarone infusion, and transferred back to [facility] on 06/04/2020.  Patient was also found to have Staph capitis Bacteremia thought to be from Port-A-Cath which was removed by IR on 3/23.  And started on antibiotics.  TEE negative for endocarditis on 3/22.  Plan is to continue IV Ancef through 06/11/2020.  Patient remains on IV Lasix per advanced heart failure team.   Subjective: Seen and examined earlier this morning.  Patient was hypoglycemic to 33 about 4 AM this morning.  Hypoglycemia resolved with orange juice and dextrose.  She complains right anterior thigh and lower back pain.  She rates her pain 8/10.  She describes the pain as achiness.  Denies focal neuro symptoms in lower extremities.  She denies chest pain or dyspnea.  Denies GI or UTI symptoms.  Objective: Vitals:   06/07/20 0347 06/07/20 0607 06/07/20 0814 06/07/20 1029  BP: 103/62  113/82 120/83  Pulse: 90  87 92  Resp: 20  17 20   Temp: (!) 97.4 F (36.3 C)  (!) 97.2 F (36.2 C) (!) 97.4 F (36.3 C)  TempSrc: Oral  Oral Oral  SpO2: 96%  96% 96%  Weight:  67 kg    Height:        Intake/Output Summary (Last  24 hours) at 06/07/2020 1157 Last data filed at 06/07/2020 1000 Gross per 24 hour  Intake 819.17 ml  Output 4650 ml  Net -3830.83 ml   Filed Weights   06/05/20 0500 06/06/20 0326 06/07/20 0607  Weight: 68.7 kg 68.9 kg 67 kg    Examination:  GENERAL: No apparent distress.  Nontoxic. HEENT: MMM.  Vision and hearing grossly intact.  NECK: Supple.  No apparent JVD.  RESP: 96% on RA.  No IWOB.  Fair aeration bilaterally. CVS:  RRR. Heart sounds normal.  ABD/GI/GU: BS+. Abd soft, NTND.  MSK/EXT:  Moves extremities. No apparent deformity.  Trace edema.  Tenderness over anterior thigh SKIN: no apparent skin lesion or wound NEURO: Awake, alert and oriented appropriately.  No apparent focal neuro deficit. PSYCH: Calm. Normal affect.   Procedures:  3/17-RHC mild PAH, severe RVF with preserved CO and no evidence of intracardiac shunt.  3/22-TTE negative for endocarditis  Microbiology summarized: 3/15-COVID-19 and influenza PCR nonreactive 3/16-BICD with a staph species 3/16-blood culture with staph capitis 3/18-urine culture with insignificant growth 3/17-blood cultures negative 3/20-MRSA PCR screen negative 3/24-blood cultures NGTD Assessment & Plan: Acute biventricular heart failure Severe tricuspid regurgitation with core pulmonale -TTE with LVEF of 45 to 50%, severely reduced RVSF and RVSP of 34 -TEE 3/22 showed EF 30-35% and moderate to severe RV dysfunction -RHC with PCWP of 16, preserved CO and severe RVF -About 3.5  L UOP/24 hours. Cr 1.71, improving -Advanced HF team managing  -IV Lasix 80 mg twice daily with metolazone 2.5 mg x 1 -Monitor fluid status, renal functions and electrolytes -Not on GDMT due to decompensated heart failure  SVT-in the setting of milrinone. -On p.o. amiodarone per cardiology  Staph capitis bacteremia: thought to be from port a cath which was removed on 3/23.  TEE on 3/22 - for vegetation.  Repeat blood cultures on 3/17 and 3/24 negative - ID  recommended Ancef through 06/11/2020.  Zyvox if discharged prior to that  AKI on CKD-3B with azotemia: Improving. Recent Labs    05/31/20 0835 06/01/20 1017 06/02/20 0413 06/03/20 0024 06/03/20 1451 06/04/20 0104 06/04/20 1157 06/05/20 0543 06/06/20 0112 06/07/20 0051  BUN 67* 62* 65* 68* 67* 72* 72* 70* 77* 76*  CREATININE 2.14* 1.84* 1.78* 1.84* 1.62* 1.82* 1.80* 2.02* 2.10* 1.71*  -Continue monitoring  Hyperkalemia: Resolved. -Monitor  Elevated LFTs: likely congestive hepatitis from RV failure?  [place] with hepatic steatosis.  Improving. -Check CK -Monitor  IDDM-2 with hypo and hyperglycemia and CKD-3B: CBG down to 33 at 4 AM this morning.  Recent Labs  Lab 06/07/20 0446 06/07/20 0512 06/07/20 0601 06/07/20 0816 06/07/20 1028  GLUCAP 65* 63* 124* 182* 153*  -Check hemoglobin A1c -Decrease Lantus from 40 to 30 units -Decrease mealtime coverage NovoLog from 10 to 6 units -Continue SSI-moderate -Further adjustment as appropriate  Hyponatremia: Na 133.  Likely in the setting of renal failure.   Sickle cell anemia/chronic pain: In crisis? complaints lower back pain and right anterior thigh pain.  SCC crisis -Continue home hydroxyurea and p.o. Dilaudid -Resume home Oxbryta from home.  Family has to bring. -May resume home deferasirox if iron level high. -Check anemia panel, particularly retic -Check CK for possible rhabdo  Acute gout? Stable -Completed steroid taper. -Resume colchicine at renal dose  Leukocytosis/bandemia: Likely demargination from steroid. -Expect improvement now off steroid  Body mass index is 29.83 kg/m. Nutrition Problem: Increased nutrient needs Etiology: chronic illness (CHF, CKD3b) Signs/Symptoms: estimated needs Interventions: Ensure Enlive (each supplement provides 350kcal and 20 grams of protein),MVI   DVT prophylaxis:  enoxaparin (LOVENOX) injection 30 mg Start: 06/05/20 0800  Code Status: Full code Family Communication:  Patient and/or RN. Available if any question.  Level of care: Progressive Status is: Inpatient  Remains inpatient appropriate because:IV treatments appropriate due to intensity of illness or inability to take PO and Inpatient level of care appropriate due to severity of illness   Dispo:  Patient From: Home  Planned Disposition: Home  Medically stable for discharge: No         Consultants:  Advanced heart failure team   Sch Meds:  Scheduled Meds: . amiodarone  200 mg Oral BID  . Chlorhexidine Gluconate Cloth  6 each Topical Daily  . enoxaparin (LOVENOX) injection  30 mg Subcutaneous Q24H  . furosemide  80 mg Intravenous BID  . hydroxyurea  1,000 mg Oral Daily  . insulin aspart  0-15 Units Subcutaneous TID WC  . insulin aspart  0-5 Units Subcutaneous QHS  . insulin aspart  6 Units Subcutaneous TID WC  . insulin glargine  30 Units Subcutaneous QHS  . metolazone  2.5 mg Oral Once  . multivitamin with minerals  1 tablet Oral Daily  . sodium chloride flush  10-40 mL Intracatheter Q12H   Continuous Infusions: . sodium chloride Stopped (06/06/20 0036)  .  ceFAZolin (ANCEF) IV 2 g (06/07/20 0528)   PRN Meds:.sodium chloride, acetaminophen,  diclofenac Sodium, guaiFENesin-dextromethorphan, heparin lock flush, heparin lock flush, HYDROmorphone, ondansetron (ZOFRAN) IV, sodium chloride flush  Antimicrobials: Anti-infectives (From admission, onward)   Start     Dose/Rate Route Frequency Ordered Stop   06/02/20 1400  ceFAZolin (ANCEF) IVPB 2g/100 mL premix        2 g 200 mL/hr over 30 Minutes Intravenous Every 8 hours 06/02/20 0943 06/11/20 2359   06/01/20 1430  vancomycin (VANCOREADY) IVPB 750 mg/150 mL  Status:  Discontinued        750 mg 150 mL/hr over 60 Minutes Intravenous Every 24 hours 06/01/20 1335 06/02/20 0943   06/01/20 1000  vancomycin (VANCOREADY) IVPB 750 mg/150 mL  Status:  Discontinued        750 mg 150 mL/hr over 60 Minutes Intravenous Every 24 hours 05/31/20  0901 05/31/20 1701   05/31/20 0930  vancomycin (VANCOREADY) IVPB 1000 mg/200 mL  Status:  Discontinued       Note to Pharmacy: Dosing per pharmacy   1,000 mg 200 mL/hr over 60 Minutes Intravenous Every 12 hours 05/31/20 0830 05/31/20 0837   05/31/20 0900  vancomycin (VANCOREADY) IVPB 1250 mg/250 mL        1,250 mg 166.7 mL/hr over 90 Minutes Intravenous  Once 05/31/20 0855 05/31/20 1300   05/30/20 1500  ceFAZolin (ANCEF) IVPB 2g/100 mL premix  Status:  Discontinued        2 g 200 mL/hr over 30 Minutes Intravenous Every 12 hours 05/30/20 1328 05/31/20 0855       I have personally reviewed the following labs and images: CBC: Recent Labs  Lab 06/01/20 0500 06/02/20 0413 06/03/20 0024 06/04/20 0104 06/05/20 0543 06/06/20 1033 06/07/20 0051  WBC 14.2* 15.4* 15.7* 16.9* 15.7* 14.9* 16.8*  NEUTROABS 9.1* 10.4* 12.4* 13.2* 12.4*  --   --   HGB 7.4* 8.0* 8.9* 8.5* 8.5* 8.7* 8.5*  HCT 19.6* 21.7* 23.4* 23.2* 22.5* 23.4* 23.1*  MCV 112.6* 115.4* 112.5* 114.9* 114.2* 115.8* 114.4*  PLT 201 225 191 275 270 283 271   BMP &GFR Recent Labs  Lab 06/01/20 1017 06/02/20 0413 06/04/20 0104 06/04/20 1157 06/05/20 0543 06/06/20 0112 06/07/20 0051  NA 131*   < > 127* 130* 129* 131* 133*  K 4.5   < > 5.6* 5.0 5.3* 4.8 4.7  CL 104   < > 102 104 102 101 104  CO2 18*   < > 16* 17* 18* 22 21*  GLUCOSE 144*   < > 378* 152* 283* 269* 84  BUN 62*   < > 72* 72* 70* 77* 76*  CREATININE 1.84*   < > 1.82* 1.80* 2.02* 2.10* 1.71*  CALCIUM 8.2*   < > 8.6* 8.6* 8.7* 8.5* 8.9  MG 2.2  --   --   --  2.4  --  2.1   < > = values in this interval not displayed.   Estimated Creatinine Clearance: 38.9 mL/min (A) (by C-G formula based on SCr of 1.71 mg/dL (H)). Liver & Pancreas: Recent Labs  Lab 06/01/20 0500 06/02/20 0413 06/05/20 0543  AST 59* 68* 61*  ALT 37 34 15  ALKPHOS 105 127* 134*  BILITOT 5.0* 4.8* 3.5*  PROT 8.6* 8.7* 9.3*  ALBUMIN 2.5* 2.5* 2.6*   No results for input(s): LIPASE,  AMYLASE in the last 168 hours. No results for input(s): AMMONIA in the last 168 hours. Diabetic: No results for input(s): HGBA1C in the last 72 hours. Recent Labs  Lab 06/07/20 0446 06/07/20 0512 06/07/20 0601 06/07/20  1610 06/07/20 1028  GLUCAP 65* 63* 124* 182* 153*   Cardiac Enzymes: No results for input(s): CKTOTAL, CKMB, CKMBINDEX, TROPONINI in the last 168 hours. No results for input(s): PROBNP in the last 8760 hours. Coagulation Profile: No results for input(s): INR, PROTIME in the last 168 hours. Thyroid Function Tests: No results for input(s): TSH, T4TOTAL, FREET4, T3FREE, THYROIDAB in the last 72 hours. Lipid Profile: No results for input(s): CHOL, HDL, LDLCALC, TRIG, CHOLHDL, LDLDIRECT in the last 72 hours. Anemia Panel: No results for input(s): VITAMINB12, FOLATE, FERRITIN, TIBC, IRON, RETICCTPCT in the last 72 hours. Urine analysis:    Component Value Date/Time   COLORURINE YELLOW 05/27/2020 1610   APPEARANCEUR CLEAR 05/27/2020 1610   LABSPEC 1.010 05/27/2020 1610   PHURINE 5.5 05/27/2020 1610   GLUCOSEU >=500 (A) 05/27/2020 1610   HGBUR NEGATIVE 05/27/2020 1610   BILIRUBINUR NEGATIVE 05/27/2020 1610   KETONESUR NEGATIVE 05/27/2020 1610   PROTEINUR NEGATIVE 05/27/2020 1610   UROBILINOGEN 0.2 05/15/2013 2130   NITRITE NEGATIVE 05/27/2020 1610   LEUKOCYTESUR NEGATIVE 05/27/2020 1610   Sepsis Labs: Invalid input(s): PROCALCITONIN, LACTICIDVEN  Microbiology: Recent Results (from the past 240 hour(s))  Culture, blood (routine x 2)     Status: Abnormal   Collection Time: 05/28/20 10:45 PM   Specimen: BLOOD RIGHT HAND  Result Value Ref Range Status   Specimen Description BLOOD RIGHT HAND  Final   Special Requests   Final    BOTTLES DRAWN AEROBIC ONLY Blood Culture adequate volume   Culture  Setup Time   Final    GRAM POSITIVE COCCI IN CLUSTERS AEROBIC BOTTLE ONLY CRITICAL VALUE NOTED.  VALUE IS CONSISTENT WITH PREVIOUSLY REPORTED AND CALLED  VALUE. Performed at [facility], [address], [place], [place] [address]    Culture STAPHYLOCOCCUS CAPITIS (A)  Final   Report Status 06/03/2020 FINAL  Final   Organism ID, Bacteria STAPHYLOCOCCUS CAPITIS  Final      Susceptibility   Staphylococcus capitis - MIC*    CIPROFLOXACIN <=0.5 SENSITIVE Sensitive     ERYTHROMYCIN >=8 RESISTANT Resistant     GENTAMICIN <=0.5 SENSITIVE Sensitive     OXACILLIN <=0.25 SENSITIVE Sensitive     TETRACYCLINE <=1 SENSITIVE Sensitive     VANCOMYCIN <=0.5 SENSITIVE Sensitive     TRIMETH/SULFA <=10 SENSITIVE Sensitive     CLINDAMYCIN <=0.25 SENSITIVE Sensitive     RIFAMPIN <=0.5 SENSITIVE Sensitive     Inducible Clindamycin NEGATIVE Sensitive     * STAPHYLOCOCCUS CAPITIS  Culture, blood (routine x 2)     Status: Abnormal   Collection Time: 05/28/20 10:45 PM   Specimen: BLOOD LEFT HAND  Result Value Ref Range Status   Specimen Description BLOOD LEFT HAND  Final   Special Requests   Final    BOTTLES DRAWN AEROBIC ONLY Blood Culture results may not be optimal due to an inadequate volume of blood received in culture bottles   Culture  Setup Time   Final    GRAM POSITIVE COCCI IN CLUSTERS AEROBIC BOTTLE ONLY Organism ID to follow CRITICAL RESULT CALLED TO, READ BACK BY AND VERIFIED WITH: C. AMEND PHARMD, AT 0550 05/30/20 BY D VANHOOK Performed at [facility], [address] [place], [place] [address]    Culture STAPHYLOCOCCUS CAPITIS (A)  Final   Report Status 06/01/2020 FINAL  Final   Organism ID, Bacteria STAPHYLOCOCCUS CAPITIS  Final      Susceptibility   Staphylococcus capitis - MIC*    CIPROFLOXACIN <=0.5 SENSITIVE  Sensitive     ERYTHROMYCIN >=8 RESISTANT Resistant     GENTAMICIN <=0.5 SENSITIVE Sensitive     OXACILLIN <=0.25 SENSITIVE Sensitive     TETRACYCLINE <=1 SENSITIVE Sensitive     VANCOMYCIN <=0.5 SENSITIVE Sensitive     TRIMETH/SULFA <=10 SENSITIVE Sensitive     CLINDAMYCIN <=0.25 SENSITIVE Sensitive     RIFAMPIN  <=0.5 SENSITIVE Sensitive     Inducible Clindamycin NEGATIVE Sensitive     * STAPHYLOCOCCUS CAPITIS  Blood Culture ID Panel (Reflexed)     Status: Abnormal   Collection Time: 05/28/20 10:45 PM  Result Value Ref Range Status   Enterococcus faecalis NOT DETECTED NOT DETECTED Final   Enterococcus Faecium NOT DETECTED NOT DETECTED Final   Listeria monocytogenes NOT DETECTED NOT DETECTED Final   Staphylococcus species DETECTED (A) NOT DETECTED Final    Comment: CRITICAL RESULT CALLED TO, READ BACK BY AND VERIFIED WITH: C. AMEND PHARMD, AT 0550 05/30/20 BY D VANHOOK    Staphylococcus aureus (BCID) NOT DETECTED NOT DETECTED Final   Staphylococcus epidermidis NOT DETECTED NOT DETECTED Final   Staphylococcus lugdunensis NOT DETECTED NOT DETECTED Final   Streptococcus species NOT DETECTED NOT DETECTED Final   Streptococcus agalactiae NOT DETECTED NOT DETECTED Final   Streptococcus pneumoniae NOT DETECTED NOT DETECTED Final   Streptococcus pyogenes NOT DETECTED NOT DETECTED Final   A.calcoaceticus-baumannii NOT DETECTED NOT DETECTED Final   Bacteroides fragilis NOT DETECTED NOT DETECTED Final   Enterobacterales NOT DETECTED NOT DETECTED Final   Enterobacter cloacae complex NOT DETECTED NOT DETECTED Final   Escherichia coli NOT DETECTED NOT DETECTED Final   Klebsiella aerogenes NOT DETECTED NOT DETECTED Final   Klebsiella oxytoca NOT DETECTED NOT DETECTED Final   Klebsiella pneumoniae NOT DETECTED NOT DETECTED Final   Proteus species NOT DETECTED NOT DETECTED Final   Salmonella species NOT DETECTED NOT DETECTED Final   Serratia marcescens NOT DETECTED NOT DETECTED Final   Haemophilus influenzae NOT DETECTED NOT DETECTED Final   Neisseria meningitidis NOT DETECTED NOT DETECTED Final   Pseudomonas aeruginosa NOT DETECTED NOT DETECTED Final   Stenotrophomonas maltophilia NOT DETECTED NOT DETECTED Final   Candida albicans NOT DETECTED NOT DETECTED Final   Candida auris NOT DETECTED NOT DETECTED  Final   Candida glabrata NOT DETECTED NOT DETECTED Final   Candida krusei NOT DETECTED NOT DETECTED Final   Candida parapsilosis NOT DETECTED NOT DETECTED Final   Candida tropicalis NOT DETECTED NOT DETECTED Final   Cryptococcus neoformans/gattii NOT DETECTED NOT DETECTED Final    Comment: Performed at [facility], [address] [place], [place] [address]  Culture, blood (routine x 2)     Status: None   Collection Time: 05/29/20  2:38 PM   Specimen: BLOOD LEFT HAND  Result Value Ref Range Status   Specimen Description BLOOD LEFT HAND  Final   Special Requests   Final    BOTTLES DRAWN AEROBIC AND ANAEROBIC Blood Culture results may not be optimal due to an inadequate volume of blood received in culture bottles   Culture   Final    NO GROWTH 5 DAYS Performed at [facility], [address] [place], [place] [address]    Report Status 06/03/2020 FINAL  Final  Culture, blood (routine x 2)     Status: None   Collection Time: 05/29/20  2:46 PM   Specimen: BLOOD RIGHT HAND  Result Value Ref Range Status   Specimen Description BLOOD RIGHT HAND  Final   Special Requests  Final    BOTTLES DRAWN AEROBIC ONLY Blood Culture results may not be optimal due to an inadequate volume of blood received in culture bottles   Culture   Final    NO GROWTH 5 DAYS Performed at [facility], [address], [place], [place] [address]    Report Status 06/03/2020 FINAL  Final  Culture, Urine     Status: Abnormal   Collection Time: 05/30/20  5:41 AM   Specimen: Urine, Catheterized  Result Value Ref Range Status   Specimen Description URINE, CATHETERIZED  Final   Special Requests NONE  Final   Culture (A)  Final    <10,000 COLONIES/mL INSIGNIFICANT GROWTH Performed at [facility], [address] [place], [place] [address]    Report Status 05/31/2020 FINAL  Final  MRSA PCR Screening     Status: None   Collection Time: 06/01/20  8:40 PM   Specimen: Nasopharyngeal  Result  Value Ref Range Status   MRSA by PCR NEGATIVE NEGATIVE Final    Comment:        The GeneXpert MRSA Assay (FDA approved for NASAL specimens only), is one component of a comprehensive MRSA colonization surveillance program. It is not intended to diagnose MRSA infection nor to guide or monitor treatment for MRSA infections. Performed at [facility], [address] [place], [place] [address]   Culture, blood (Routine X 2) w Reflex to ID Panel     Status: None (Preliminary result)   Collection Time: 06/05/20 10:08 AM   Specimen: Left Antecubital; Blood  Result Value Ref Range Status   Specimen Description LEFT ANTECUBITAL  Final   Special Requests   Final    BOTTLES DRAWN AEROBIC AND ANAEROBIC Blood Culture adequate volume   Culture   Final    NO GROWTH < 24 HOURS Performed at [facility], [address] [place], [place] [address]    Report Status PENDING  Incomplete  Culture, blood (Routine X 2) w Reflex to ID Panel     Status: None (Preliminary result)   Collection Time: 06/05/20 10:24 AM   Specimen: BLOOD RIGHT HAND  Result Value Ref Range Status   Specimen Description BLOOD RIGHT HAND  Final   Special Requests   Final    BOTTLES DRAWN AEROBIC AND ANAEROBIC Blood Culture adequate volume   Culture   Final    NO GROWTH < 24 HOURS Performed at [facility], [address] [place], [place] [address]    Report Status PENDING  Incomplete    Radiology Studies: No results found.    Taye T. Gonfa [facility]  If 7PM-7AM, please contact night-coverage [email or web address] 06/07/2020, 11:57 AM

## 2020-06-07 NOTE — Progress Notes (Addendum)
Pt CBG= 33 at 407am  this morning. Orange juice provided and crackers. Recheck CBG= 65. Pt given apple juice.Oncall MD notified via AMION textpage. Pt in NAD. Will continue to monitor.   -Pt CBG= 63 after apple juice. I gave half amp of D50 and CBG= 125 at 6 am. Discussed management of pt CBG with Dr Antionette Char.Will continue to monitor.

## 2020-06-08 ENCOUNTER — Inpatient Hospital Stay (HOSPITAL_COMMUNITY): Payer: Medicaid Other

## 2020-06-08 DIAGNOSIS — E1122 Type 2 diabetes mellitus with diabetic chronic kidney disease: Secondary | ICD-10-CM | POA: Diagnosis not present

## 2020-06-08 DIAGNOSIS — R17 Unspecified jaundice: Secondary | ICD-10-CM | POA: Diagnosis not present

## 2020-06-08 DIAGNOSIS — R7881 Bacteremia: Secondary | ICD-10-CM | POA: Diagnosis not present

## 2020-06-08 DIAGNOSIS — I50813 Acute on chronic right heart failure: Secondary | ICD-10-CM | POA: Diagnosis not present

## 2020-06-08 LAB — CBC
HCT: 23.8 % — ABNORMAL LOW (ref 36.0–46.0)
Hemoglobin: 8.8 g/dL — ABNORMAL LOW (ref 12.0–15.0)
MCH: 43.1 pg — ABNORMAL HIGH (ref 26.0–34.0)
MCHC: 37 g/dL — ABNORMAL HIGH (ref 30.0–36.0)
MCV: 116.7 fL — ABNORMAL HIGH (ref 80.0–100.0)
Platelets: 290 10*3/uL (ref 150–400)
RBC: 2.04 MIL/uL — ABNORMAL LOW (ref 3.87–5.11)
RDW: 21.2 % — ABNORMAL HIGH (ref 11.5–15.5)
WBC: 14.5 10*3/uL — ABNORMAL HIGH (ref 4.0–10.5)
nRBC: 4.7 % — ABNORMAL HIGH (ref 0.0–0.2)

## 2020-06-08 LAB — BASIC METABOLIC PANEL
Anion gap: 8 (ref 5–15)
BUN: 68 mg/dL — ABNORMAL HIGH (ref 6–20)
CO2: 25 mmol/L (ref 22–32)
Calcium: 9.1 mg/dL (ref 8.9–10.3)
Chloride: 99 mmol/L (ref 98–111)
Creatinine, Ser: 1.48 mg/dL — ABNORMAL HIGH (ref 0.44–1.00)
GFR, Estimated: 48 mL/min — ABNORMAL LOW (ref >60)
Glucose, Bld: 92 mg/dL (ref 70–99)
Potassium: 5 mmol/L (ref 3.5–5.1)
Sodium: 132 mmol/L — ABNORMAL LOW (ref 135–145)

## 2020-06-08 LAB — GLUCOSE, CAPILLARY
Glucose-Capillary: 75 mg/dL (ref 70–99)
Glucose-Capillary: 144 mg/dL — ABNORMAL HIGH (ref 70–99)
Glucose-Capillary: 117 mg/dL — ABNORMAL HIGH (ref 70–99)
Glucose-Capillary: 125 mg/dL — ABNORMAL HIGH (ref 70–99)
Glucose-Capillary: 141 mg/dL — ABNORMAL HIGH (ref 70–99)

## 2020-06-08 LAB — MAGNESIUM: Magnesium: 2 mg/dL (ref 1.7–2.4)

## 2020-06-08 MED ORDER — INSULIN GLARGINE 100 UNIT/ML ~~LOC~~ SOLN
25.0000 [IU] | Freq: Every day | SUBCUTANEOUS | Status: DC
  Administered 2020-06-08: 25 [IU] via SUBCUTANEOUS
  Filled 2020-06-08 (×2): qty 0.25

## 2020-06-08 MED ORDER — METOLAZONE 2.5 MG PO TABS
2.5000 mg | ORAL_TABLET | Freq: Once | ORAL | Status: AC
  Administered 2020-06-08: 2.5 mg via ORAL
  Filled 2020-06-08: qty 1

## 2020-06-08 MED ORDER — CYCLOBENZAPRINE HCL 10 MG PO TABS
5.0000 mg | ORAL_TABLET | Freq: Three times a day (TID) | ORAL | Status: DC | PRN
  Administered 2020-06-08 (×2): 5 mg via ORAL
  Filled 2020-06-08 (×2): qty 1

## 2020-06-08 NOTE — Progress Notes (Signed)
Patient ID: Pamela Hudson, female   DOB: [birth year], 35 y.o.   MRN: [id number]     Advanced Heart Failure Rounding Note  PCP-Cardiologist: No primary care provider on file.   Subjective:   - 3/17 RHC Mild PAH RA 23 PA 35/14 (26) PCWP 16 CO 6.9 CI 5.8  - 3/20 Developed hemodynamically unable SVT, HR in the 190s. Failed vagal maneuvers, adenosine x 4, converted on amio gtt. Moved to CCU.  Milrinone stopped for hypotension. Started on Neo. - 3/22 had recurrent SVT restarted on amio gtt - 3/24 give 80 mg IV lasix x1. I/O not accurate.   Being treated for staph capitus bacteremia. ID following. Abx changed Ancef.  Afebrile.  Rt chest port removed 3/23.   TEE 3/22 negative for endocarditis but did show new onset LV dysfunction on top of RV dysfunction and severe TR. LVEF now 30-35%.   No further SVT, NSR 80s today.   SCr 1.62>1.82>>2.02>>2.1 >> 1.7 >> 1.48  Patient diuresed well again yesterday with IV Lasix and metolazone.  Weight down.   She reports pain in her legs bilaterally.  No dyspnea at rest.    Objective:   Weight Range: 63.2 kg Body mass index is 28.14 kg/m.   Vital Signs:   Temp:  [97.4 F (36.3 C)-98.2 F (36.8 C)] 98.2 F (36.8 C) (03/27 0848) Pulse Rate:  [80-93] 93 (03/27 0344) Resp:  [12-20] 14 (03/27 0344) BP: (114-127)/(82-91) 127/91 (03/27 0344) SpO2:  [95 %-100 %] 95 % (03/27 0848) Weight:  [63.2 kg] 63.2 kg (03/27 0344) Last BM Date: 06/06/20  Weight change: Filed Weights   06/06/20 0326 06/07/20 0607 06/08/20 0344  Weight: 68.9 kg 67 kg 63.2 kg    Intake/Output:   Intake/Output Summary (Last 24 hours) at 06/08/2020 0854 Last data filed at 06/08/2020 2633 Gross per 24 hour  Intake 240 ml  Output 3600 ml  Net -3360 ml      Physical Exam    General: NAD Neck: JVP 14-16 cm, no thyromegaly or thyroid nodule.  Lungs: Clear to auscultation bilaterally with normal respiratory effort. CV: Nondisplaced PMI.  Heart regular S1/S2, no S3/S4, 2/6 HSM  LLSB.  Trace ankle edema.   Abdomen: Soft, nontender, no hepatosplenomegaly, no distention.  Skin: Intact without lesions or rashes.  Neurologic: Alert and oriented x 3.  Psych: Normal affect. Extremities: No clubbing or cyanosis.  HEENT: Normal.    Telemetry   NSR 80s  No further SVT (personally reviewed)  EKG    No new EKG to review   Labs    CBC Recent Labs    06/07/20 0051 06/08/20 0315  WBC 16.8* 14.5*  HGB 8.5* 8.8*  HCT 23.1* 23.8*  MCV 114.4* 116.7*  PLT 271 290   Basic Metabolic Panel Recent Labs    35/45/62 0051 06/08/20 0315  NA 133* 132*  K 4.7 5.0  CL 104 99  CO2 21* 25  GLUCOSE 84 92  BUN 76* 68*  CREATININE 1.71* 1.48*  CALCIUM 8.9 9.1  MG 2.1 2.0   Liver Function Tests No results for input(s): AST, ALT, ALKPHOS, BILITOT, PROT, ALBUMIN in the last 72 hours. No results for input(s): LIPASE, AMYLASE in the last 72 hours. Cardiac Enzymes Recent Labs    06/07/20 1206  CKTOTAL 25*    BNP: BNP (last 3 results) Recent Labs    05/27/20 1733 05/29/20 0615  BNP 1,228.5* 3,195.8*    ProBNP (last 3 results) No results for input(s): PROBNP in the last  8760 hours.   D-Dimer No results for input(s): DDIMER in the last 72 hours. Hemoglobin A1C No results for input(s): HGBA1C in the last 72 hours. Fasting Lipid Panel No results for input(s): CHOL, HDL, LDLCALC, TRIG, CHOLHDL, LDLDIRECT in the last 72 hours. Thyroid Function Tests No results for input(s): TSH, T4TOTAL, T3FREE, THYROIDAB in the last 72 hours.  Invalid input(s): FREET3  Other results:   Imaging    No results found.   Medications:     Scheduled Medications: . amiodarone  200 mg Oral BID  . Chlorhexidine Gluconate Cloth  6 each Topical Daily  . enoxaparin (LOVENOX) injection  30 mg Subcutaneous Q24H  . furosemide  80 mg Intravenous BID  . hydroxyurea  1,000 mg Oral Daily  . insulin aspart  0-15 Units Subcutaneous TID WC  . insulin aspart  0-5 Units  Subcutaneous QHS  . insulin aspart  6 Units Subcutaneous TID WC  . insulin glargine  25 Units Subcutaneous QHS  . metolazone  2.5 mg Oral Once  . multivitamin with minerals  1 tablet Oral Daily  . sodium chloride flush  10-40 mL Intracatheter Q12H  . voxelotor  1,500 mg Oral Daily    Infusions: . sodium chloride Stopped (06/06/20 0036)  .  ceFAZolin (ANCEF) IV 2 g (06/08/20 0628)    PRN Medications: sodium chloride, acetaminophen, cyclobenzaprine, diclofenac Sodium, guaiFENesin-dextromethorphan, heparin lock flush, heparin lock flush, HYDROmorphone (DILAUDID) injection, HYDROmorphone, ondansetron (ZOFRAN) IV, sodium chloride flush   Assessment/Plan    1. 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   - No beta blocker with profound HF.  - no dig yet w/ elevated creatinine.  - She responded well to diuresis yesterday, weight down.  Still volume overloaded on exam.  Creatinine lower at 1.48.  I will repeat Lasix 80 mg IV bid with dose of metolazone 2.5 x 1.   - she is end-stage, options as outlined above very limited   3. AKI on CKD  - Creatinine on admit 1.5-->2.5 - Creatinine lower today with diuresis, 1.62>1.82>>2.02>>2.1>>1.7>>1.48  4. SVT  - now off milrinone - Continue po amiodarone.   5. Sickle Cell Anemia  - per primary team   6. TR - Severe on echo - likely functional from severely dilated RV  - ? Primary leaflet pathology such as carcinoid involvement   - TEE 3/22 negative for endocarditis   7. Hyperkalemia - Stable today, K 5.   8. Bacteremia - BCs 3/16 + for Staph capitus - BCs 3/17 NGTD - WBC 17>>15K. AF  - on abx, Ancef. ID following  - TEE negative for endocarditis  - Rt Chest Port removed 3/23   9. Acute Gout - Lt Great toe - colchicine on hold w/ AKI  - Improving w/ prednisone. Cont through 3/26 => stop today.   10. Hypervolemic hyponatremia - Na 132 today.  - monitor  11. Type 2DM - glucose running high in 300s - on  insulin, adjustments per primary team    Length of Stay: 12  Marca Ancona, MD  06/08/2020, 8:54 AM  Advanced Heart Failure Team Pager [phone] (M-F; 7a - 5p)  Please contact [facility] for night-coverage after hours (4p -7a ) and weekends on [email or web address]

## 2020-06-08 NOTE — Progress Notes (Signed)
PROGRESS NOTE  Pamela Hudson JYN:[id number] DOB: [birth year]   PCP: Judd Lien, PA-C  Patient is from: Home  DOA: 05/27/2020 LOS: 12  Chief complaints: Shortness of breath, edema and weight gain  Brief Narrative / Interim history: 35 year old F with PMH of SCC, combined CHF, PAH, CKD-3B, DM-2, CVA, HTN, PCOS and COVID-19 infection 2020 presented to ED with progressive shortness of breath, edema and about 5 pound weight gain in 3 days, and admitted for acute on chronic combined CHF.   Patient underwent RHC that showed mild PAH, severe RVF with preserved CO and no evidence of intracardiac shunt.  She was a started on milrinone drip for assistance with aggressive diuresis on 3/18 but went into SVT on 3/20 requiring transfer to ICU.  Eventually, milrinone discontinued and she was a started on amiodarone infusion, and transferred back to [facility] on 06/04/2020.  Patient was also found to have Staph capitis Bacteremia thought to be from Port-A-Cath which was removed by IR on 3/23.  And started on antibiotics.  TEE negative for endocarditis on 3/22.  Plan is to continue IV Ancef through 06/11/2020.  Patient remains on IV Lasix per advanced heart failure team.   Subjective: Seen and examined earlier this morning.  No major events overnight of this morning.  Continues to endorse right thigh pain.  She rates her pain 5/10 this morning.  Feels sore.  His CK was normal.  She does not appear to be in crisis based on her reticulocyte and hemoglobin.  She is asking if she can get muscle relaxer.  Denies chest pain, shortness of breath, GI or UTI symptoms.  Objective: Vitals:   06/07/20 2003 06/07/20 2343 06/08/20 0344 06/08/20 0848  BP: 114/83 117/82 (!) 127/91 125/90  Pulse: 86 86 93   Resp: 12 20 14 15   Temp: 98 F (36.7 C) 97.7 F (36.5 C) 98.2 F (36.8 C) 98.2 F (36.8 C)  TempSrc: Oral Oral Oral Oral  SpO2: 99% 100% 99% 95%  Weight:   63.2 kg   Height:         Intake/Output Summary (Last 24 hours) at 06/08/2020 1104 Last data filed at 06/08/2020 0800 Gross per 24 hour  Intake 360 ml  Output 2700 ml  Net -2340 ml   Filed Weights   06/06/20 0326 06/07/20 0607 06/08/20 0344  Weight: 68.9 kg 67 kg 63.2 kg    Examination:  GENERAL: No apparent distress.  Nontoxic. HEENT: MMM.  Vision and hearing grossly intact.  NECK: Supple.  No apparent JVD.  RESP:  No IWOB.  Fair aeration bilaterally. CVS:  RRR. Heart sounds normal.  ABD/GI/GU: BS+. Abd soft, NTND.  MSK/EXT:  Moves extremities. No apparent deformity. No edema.  Tenderness over right anterior thigh. SKIN: no apparent skin lesion or wound NEURO: Awake, alert and oriented appropriately.  No apparent focal neuro deficit. PSYCH: Calm. Normal affect.  Procedures:  3/17-RHC mild PAH, severe RVF with preserved CO and no evidence of intracardiac shunt.  3/22-TTE negative for endocarditis  Microbiology summarized: 3/15-COVID-19 and influenza PCR nonreactive 3/16-BICD with a staph species 3/16-blood culture with staph capitis 3/18-urine culture with insignificant growth 3/17-blood cultures negative 3/20-MRSA PCR screen negative 3/24-blood cultures NGTD Assessment & Plan: Acute biventricular heart failure Severe tricuspid regurgitation with core pulmonale -TTE with LVEF of 45 to 50%, severely reduced RVSF and RVSP of 34 -TEE 3/22 showed EF 30-35% and moderate to severe RV dysfunction -RHC with PCWP of 16, preserved CO and severe RVF -About  3.9 L UOP/24 hours. Cr down to 1.38.  Net -5.8 L but not sure if acutely captured -Advanced HF team managing  -IV Lasix 80 mg twice daily + metolazone 2.5 mg x 1 -Monitor fluid status, renal functions and electrolytes -Not on GDMT due to decompensated heart failure  SVT-in the setting of milrinone.  Resolved. -On p.o. amiodarone per cardiology  Staph capitis bacteremia: thought to be from port a cath which was removed on 3/23.  TEE on 3/22 -  for vegetation.  Repeat blood cultures on 3/17 and 3/24 negative - ID recommended Ancef through 06/11/2020.  Zyvox if discharged before 3/30  AKI on CKD-3B with azotemia: Improving. Recent Labs    06/01/20 1017 06/02/20 0413 06/03/20 0024 06/03/20 1451 06/04/20 0104 06/04/20 1157 06/05/20 0543 06/06/20 0112 06/07/20 0051 06/08/20 0315  BUN 62* 65* 68* 67* 72* 72* 70* 77* 76* 68*  CREATININE 1.84* 1.78* 1.84* 1.62* 1.82* 1.80* 2.02* 2.10* 1.71* 1.48*  -Continue monitoring  Hyperkalemia: Resolved. -Monitor  Elevated LFTs: likely congestive hepatitis from RV failure?  US with hepatic steatosis.  CK within normal improving. -Recheck LFT in the morning  Controlled IDDM-2 with hypo and hyperglycemia and CKD-3B: A1c 5.2 on 3/16 but could be falsely low due to sickle cell anemia with rapid turnover of RBC. Recent Labs  Lab 06/07/20 2010 06/07/20 2357 06/08/20 0522 06/08/20 0746 06/08/20 1100  GLUCAP 194* 133* 75 144* 117*  -Decrease Lantus from 30 to 25 units at bedtime -Continue mealtime coverage NovoLog at 6 units -Continue SSI-moderate -Further adjustment as appropriate  Hyponatremia: Na 132.  Likely in the setting of renal failure and hypervolemia.  Sickle cell anemia/chronic pain: Complains right anterior thigh pain.  Does not appear to be in crisis based on her labs.  CK within normal. Recent Labs    05/31/20 0351 05/31/20 2130 06/01/20 0500 06/02/20 0413 06/03/20 0024 06/04/20 0104 06/05/20 0543 06/06/20 1033 06/07/20 0051 06/08/20 0315  HGB 6.1* 7.7* 7.4* 8.0* 8.9* 8.5* 8.5* 8.7* 8.5* 8.8*  -Check x-ray although this would  be low yield -Continue home hydroxyurea -Continue p.o. Dilaudid -Add p.o. Flexeril 5 mg 3 times daily as needed -Patient is out of Mexicoxbryta and our pharmacy doesn't carry it  Acute gout? Stable -Completed steroid taper. -May resume colchicine at renal dose  Leukocytosis/bandemia: Likely demargination from steroid.   Improving. -Continue monitoring  Overweight Body mass index is 28.14 kg/m. Nutrition Problem: Increased nutrient needs Etiology: chronic illness (CHF, CKD3b) Signs/Symptoms: estimated needs Interventions: Ensure Enlive (each supplement provides 350kcal and 20 grams of protein),MVI   DVT prophylaxis:  enoxaparin (LOVENOX) injection 30 mg Start: 06/05/20 0800  Code Status: Full code Family Communication: Patient and/or RN. Available if any question.  Level of care: Progressive Status is: Inpatient  Remains inpatient appropriate because:IV treatments appropriate due to intensity of illness or inability to take PO and Inpatient level of care appropriate due to severity of illness   Dispo:  Patient From: Home  Planned Disposition: Home  Medically stable for discharge: No         Consultants:  Advanced heart failure team   Sch Meds:  Scheduled Meds: . amiodarone  200 mg Oral BID  . Chlorhexidine Gluconate Cloth  6 each Topical Daily  . enoxaparin (LOVENOX) injection  30 mg Subcutaneous Q24H  . furosemide  80 mg Intravenous BID  . hydroxyurea  1,000 mg Oral Daily  . insulin aspart  0-15 Units Subcutaneous TID WC  . insulin aspart  0-5 Units Subcutaneous  QHS  . insulin aspart  6 Units Subcutaneous TID WC  . insulin glargine  25 Units Subcutaneous QHS  . metolazone  2.5 mg Oral Once  . multivitamin with minerals  1 tablet Oral Daily  . sodium chloride flush  10-40 mL Intracatheter Q12H  . voxelotor  1,500 mg Oral Daily   Continuous Infusions: . sodium chloride Stopped (06/06/20 0036)  .  ceFAZolin (ANCEF) IV 2 g (06/08/20 0628)   PRN Meds:.sodium chloride, acetaminophen, cyclobenzaprine, diclofenac Sodium, guaiFENesin-dextromethorphan, heparin lock flush, heparin lock flush, HYDROmorphone (DILAUDID) injection, HYDROmorphone, ondansetron (ZOFRAN) IV, sodium chloride flush  Antimicrobials: Anti-infectives (From admission, onward)   Start     Dose/Rate Route Frequency  Ordered Stop   06/02/20 1400  ceFAZolin (ANCEF) IVPB 2g/100 mL premix        2 g 200 mL/hr over 30 Minutes Intravenous Every 8 hours 06/02/20 0943 06/11/20 2359   06/01/20 1430  vancomycin (VANCOREADY) IVPB 750 mg/150 mL  Status:  Discontinued        750 mg 150 mL/hr over 60 Minutes Intravenous Every 24 hours 06/01/20 1335 06/02/20 0943   06/01/20 1000  vancomycin (VANCOREADY) IVPB 750 mg/150 mL  Status:  Discontinued        750 mg 150 mL/hr over 60 Minutes Intravenous Every 24 hours 05/31/20 0901 05/31/20 1701   05/31/20 0930  vancomycin (VANCOREADY) IVPB 1000 mg/200 mL  Status:  Discontinued       Note to Pharmacy: Dosing per pharmacy   1,000 mg 200 mL/hr over 60 Minutes Intravenous Every 12 hours 05/31/20 0830 05/31/20 0837   05/31/20 0900  vancomycin (VANCOREADY) IVPB 1250 mg/250 mL        1,250 mg 166.7 mL/hr over 90 Minutes Intravenous  Once 05/31/20 0855 05/31/20 1300   05/30/20 1500  ceFAZolin (ANCEF) IVPB 2g/100 mL premix  Status:  Discontinued        2 g 200 mL/hr over 30 Minutes Intravenous Every 12 hours 05/30/20 1328 05/31/20 0855       I have personally reviewed the following labs and images: CBC: Recent Labs  Lab 06/02/20 0413 06/03/20 0024 06/04/20 0104 06/05/20 0543 06/06/20 1033 06/07/20 0051 06/08/20 0315  WBC 15.4* 15.7* 16.9* 15.7* 14.9* 16.8* 14.5*  NEUTROABS 10.4* 12.4* 13.2* 12.4*  --   --   --   HGB 8.0* 8.9* 8.5* 8.5* 8.7* 8.5* 8.8*  HCT 21.7* 23.4* 23.2* 22.5* 23.4* 23.1* 23.8*  MCV 115.4* 112.5* 114.9* 114.2* 115.8* 114.4* 116.7*  PLT 225 191 275 270 283 271 290   BMP &GFR Recent Labs  Lab 06/04/20 1157 06/05/20 0543 06/06/20 0112 06/07/20 0051 06/08/20 0315  NA 130* 129* 131* 133* 132*  K 5.0 5.3* 4.8 4.7 5.0  CL 104 102 101 104 99  CO2 17* 18* 22 21* 25  GLUCOSE 152* 283* 269* 84 92  BUN 72* 70* 77* 76* 68*  CREATININE 1.80* 2.02* 2.10* 1.71* 1.48*  CALCIUM 8.6* 8.7* 8.5* 8.9 9.1  MG  --  2.4  --  2.1 2.0   Estimated Creatinine  Clearance: 43.7 mL/min (A) (by C-G formula based on SCr of 1.48 mg/dL (H)). Liver & Pancreas: Recent Labs  Lab 06/02/20 0413 06/05/20 0543  AST 68* 61*  ALT 34 15  ALKPHOS 127* 134*  BILITOT 4.8* 3.5*  PROT 8.7* 9.3*  ALBUMIN 2.5* 2.6*   No results for input(s): LIPASE, AMYLASE in the last 168 hours. No results for input(s): AMMONIA in the last 168 hours. Diabetic: No results  for input(s): HGBA1C in the last 72 hours. Recent Labs  Lab 06/07/20 2010 06/07/20 2357 06/08/20 0522 06/08/20 0746 06/08/20 1100  GLUCAP 194* 133* 75 144* 117*   Cardiac Enzymes: Recent Labs  Lab 06/07/20 1206  CKTOTAL 25*   No results for input(s): PROBNP in the last 8760 hours. Coagulation Profile: No results for input(s): INR, PROTIME in the last 168 hours. Thyroid Function Tests: No results for input(s): TSH, T4TOTAL, FREET4, T3FREE, THYROIDAB in the last 72 hours. Lipid Profile: No results for input(s): CHOL, HDL, LDLCALC, TRIG, CHOLHDL, LDLDIRECT in the last 72 hours. Anemia Panel: Recent Labs    06/07/20 1206  VITAMINB12 467  FOLATE 11.9  FERRITIN 6,999*  TIBC 153*  IRON 133  RETICCTPCT 7.5*   Urine analysis:    Component Value Date/Time   COLORURINE YELLOW 05/27/2020 1610   APPEARANCEUR CLEAR 05/27/2020 1610   LABSPEC 1.010 05/27/2020 1610   PHURINE 5.5 05/27/2020 1610   GLUCOSEU >=500 (A) 05/27/2020 1610   HGBUR NEGATIVE 05/27/2020 1610   BILIRUBINUR NEGATIVE 05/27/2020 1610   KETONESUR NEGATIVE 05/27/2020 1610   PROTEINUR NEGATIVE 05/27/2020 1610   UROBILINOGEN 0.2 05/15/2013 2130   NITRITE NEGATIVE 05/27/2020 1610   LEUKOCYTESUR NEGATIVE 05/27/2020 1610   Sepsis Labs: Invalid input(s): PROCALCITONIN, LACTICIDVEN  Microbiology: Recent Results (from the past 240 hour(s))  Culture, blood (routine x 2)     Status: None   Collection Time: 05/29/20  2:38 PM   Specimen: BLOOD LEFT HAND  Result Value Ref Range Status   Specimen Description BLOOD LEFT HAND  Final    Special Requests   Final    BOTTLES DRAWN AEROBIC AND ANAEROBIC Blood Culture results may not be optimal due to an inadequate volume of blood received in culture bottles   Culture   Final    NO GROWTH 5 DAYS Performed at [facility], [address] [place], [place] [address]    Report Status 06/03/2020 FINAL  Final  Culture, blood (routine x 2)     Status: None   Collection Time: 05/29/20  2:46 PM   Specimen: BLOOD RIGHT HAND  Result Value Ref Range Status   Specimen Description BLOOD RIGHT HAND  Final   Special Requests   Final    BOTTLES DRAWN AEROBIC ONLY Blood Culture results may not be optimal due to an inadequate volume of blood received in culture bottles   Culture   Final    NO GROWTH 5 DAYS Performed at [facility], [address], [place], [place] [address]    Report Status 06/03/2020 FINAL  Final  Culture, Urine     Status: Abnormal   Collection Time: 05/30/20  5:41 AM   Specimen: Urine, Catheterized  Result Value Ref Range Status   Specimen Description URINE, CATHETERIZED  Final   Special Requests NONE  Final   Culture (A)  Final    <10,000 COLONIES/mL INSIGNIFICANT GROWTH Performed at [facility], [address], [place], [place] [address]    Report Status 05/31/2020 FINAL  Final  MRSA PCR Screening     Status: None   Collection Time: 06/01/20  8:40 PM   Specimen: Nasopharyngeal  Result Value Ref Range Status   MRSA by PCR NEGATIVE NEGATIVE Final    Comment:        The GeneXpert MRSA Assay (FDA approved for NASAL specimens only), is one component of a comprehensive MRSA colonization surveillance program. It is not intended to diagnose MRSA infection nor  to guide or monitor treatment for MRSA infections. Performed at [facility], [address] [place], [place] [address]   Culture, blood (Routine X 2) w Reflex to ID Panel     Status: None (Preliminary result)   Collection Time: 06/05/20 10:08 AM   Specimen: Left Antecubital;  Blood  Result Value Ref Range Status   Specimen Description LEFT ANTECUBITAL  Final   Special Requests   Final    BOTTLES DRAWN AEROBIC AND ANAEROBIC Blood Culture adequate volume   Culture   Final    NO GROWTH 2 DAYS Performed at [facility], [address], [place], [place] [address]    Report Status PENDING  Incomplete  Culture, blood (Routine X 2) w Reflex to ID Panel     Status: None (Preliminary result)   Collection Time: 06/05/20 10:24 AM   Specimen: BLOOD RIGHT HAND  Result Value Ref Range Status   Specimen Description BLOOD RIGHT HAND  Final   Special Requests   Final    BOTTLES DRAWN AEROBIC AND ANAEROBIC Blood Culture adequate volume   Culture   Final    NO GROWTH 2 DAYS Performed at [facility], [address] [place], [place] [address]    Report Status PENDING  Incomplete    Radiology Studies: DG FEMUR, MIN 2 VIEWS RIGHT  Result Date: 06/08/2020 CLINICAL DATA:  RIGHT upper leg pain for 2 days, no known injury EXAM: RIGHT FEMUR 2 VIEWS COMPARISON:  None FINDINGS: Osseous demineralization. Joint spaces preserved. No acute fracture, dislocation, or bone destruction. Few scattered pelvic phleboliths. IMPRESSION: Osseous demineralization without acute abnormalities. Electronically Signed   By: Ulyses Southward M.D.   On: 06/08/2020 10:59      Taye T[facility]  If 7PM-7AM, please contact night-coverage [email or web address] 06/08/2020, 11:04 AM

## 2020-06-09 DIAGNOSIS — E1122 Type 2 diabetes mellitus with diabetic chronic kidney disease: Secondary | ICD-10-CM | POA: Diagnosis not present

## 2020-06-09 DIAGNOSIS — I50813 Acute on chronic right heart failure: Secondary | ICD-10-CM | POA: Diagnosis not present

## 2020-06-09 DIAGNOSIS — R7881 Bacteremia: Secondary | ICD-10-CM | POA: Diagnosis not present

## 2020-06-09 DIAGNOSIS — D72829 Elevated white blood cell count, unspecified: Secondary | ICD-10-CM | POA: Diagnosis not present

## 2020-06-09 DIAGNOSIS — M79651 Pain in right thigh: Secondary | ICD-10-CM

## 2020-06-09 DIAGNOSIS — I509 Heart failure, unspecified: Secondary | ICD-10-CM | POA: Diagnosis not present

## 2020-06-09 DIAGNOSIS — R17 Unspecified jaundice: Secondary | ICD-10-CM | POA: Diagnosis not present

## 2020-06-09 LAB — HEPATIC FUNCTION PANEL
ALT: 13 U/L (ref 0–44)
AST: 67 U/L — ABNORMAL HIGH (ref 15–41)
Albumin: 2.6 g/dL — ABNORMAL LOW (ref 3.5–5.0)
Alkaline Phosphatase: 117 U/L (ref 38–126)
Bilirubin, Direct: 1.3 mg/dL — ABNORMAL HIGH (ref 0.0–0.2)
Indirect Bilirubin: 2.8 mg/dL — ABNORMAL HIGH (ref 0.3–0.9)
Total Bilirubin: 4.1 mg/dL — ABNORMAL HIGH (ref 0.3–1.2)
Total Protein: 9.1 g/dL — ABNORMAL HIGH (ref 6.5–8.1)

## 2020-06-09 LAB — CBC
HCT: 24.3 % — ABNORMAL LOW (ref 36.0–46.0)
Hemoglobin: 9 g/dL — ABNORMAL LOW (ref 12.0–15.0)
MCH: 43.1 pg — ABNORMAL HIGH (ref 26.0–34.0)
MCHC: 37 g/dL — ABNORMAL HIGH (ref 30.0–36.0)
MCV: 116.3 fL — ABNORMAL HIGH (ref 80.0–100.0)
Platelets: 303 10*3/uL (ref 150–400)
RBC: 2.09 MIL/uL — ABNORMAL LOW (ref 3.87–5.11)
RDW: 21.2 % — ABNORMAL HIGH (ref 11.5–15.5)
WBC: 13.6 10*3/uL — ABNORMAL HIGH (ref 4.0–10.5)
nRBC: 4.5 % — ABNORMAL HIGH (ref 0.0–0.2)

## 2020-06-09 LAB — GLUCOSE, CAPILLARY
Glucose-Capillary: 100 mg/dL — ABNORMAL HIGH (ref 70–99)
Glucose-Capillary: 127 mg/dL — ABNORMAL HIGH (ref 70–99)
Glucose-Capillary: 158 mg/dL — ABNORMAL HIGH (ref 70–99)
Glucose-Capillary: 160 mg/dL — ABNORMAL HIGH (ref 70–99)
Glucose-Capillary: 220 mg/dL — ABNORMAL HIGH (ref 70–99)
Glucose-Capillary: 224 mg/dL — ABNORMAL HIGH (ref 70–99)
Glucose-Capillary: 53 mg/dL — ABNORMAL LOW (ref 70–99)

## 2020-06-09 LAB — BASIC METABOLIC PANEL
Anion gap: 7 (ref 5–15)
BUN: 61 mg/dL — ABNORMAL HIGH (ref 6–20)
CO2: 29 mmol/L (ref 22–32)
Calcium: 9.1 mg/dL (ref 8.9–10.3)
Chloride: 96 mmol/L — ABNORMAL LOW (ref 98–111)
Creatinine, Ser: 1.64 mg/dL — ABNORMAL HIGH (ref 0.44–1.00)
GFR, Estimated: 42 mL/min — ABNORMAL LOW (ref >60)
Glucose, Bld: 116 mg/dL — ABNORMAL HIGH (ref 70–99)
Potassium: 4.9 mmol/L (ref 3.5–5.1)
Sodium: 132 mmol/L — ABNORMAL LOW (ref 135–145)

## 2020-06-09 LAB — MAGNESIUM: Magnesium: 1.9 mg/dL (ref 1.7–2.4)

## 2020-06-09 MED ORDER — MAGNESIUM SULFATE 2 GM/50ML IV SOLN
2.0000 g | Freq: Once | INTRAVENOUS | Status: AC
  Administered 2020-06-09: 2 g via INTRAVENOUS
  Filled 2020-06-09: qty 50

## 2020-06-09 MED ORDER — DICLOFENAC SODIUM 1 % EX GEL
2.0000 g | Freq: Four times a day (QID) | CUTANEOUS | Status: DC
  Administered 2020-06-09 – 2020-06-11 (×10): 2 g via TOPICAL
  Filled 2020-06-09: qty 100

## 2020-06-09 MED ORDER — INSULIN GLARGINE 100 UNIT/ML ~~LOC~~ SOLN
15.0000 [IU] | Freq: Every day | SUBCUTANEOUS | Status: DC
  Administered 2020-06-09: 15 [IU] via SUBCUTANEOUS
  Filled 2020-06-09 (×2): qty 0.15

## 2020-06-09 MED ORDER — DEFERASIROX 180 MG PO TABS
540.0000 mg | ORAL_TABLET | Freq: Every day | ORAL | Status: DC
  Administered 2020-06-09 – 2020-06-10 (×2): 540 mg via ORAL
  Filled 2020-06-09 (×3): qty 1

## 2020-06-09 MED ORDER — DEFERASIROX 360 MG PO TABS
540.0000 mg | ORAL_TABLET | Freq: Every day | ORAL | Status: DC
  Filled 2020-06-09: qty 2

## 2020-06-09 NOTE — Progress Notes (Addendum)
Patient ID: Pamela Hudson, female   DOB: [birth year], 35 y.o.   MRN: [id number]     Advanced Heart Failure Rounding Note  PCP-Cardiologist: No primary care provider on file.   Subjective:    - 3/17 RHC Mild PAH RA 23 PA 35/14 (26) PCWP 16 CO 6.9 CI 5.8  - 3/20 Developed hemodynamically unable SVT, HR in the 190s. Failed vagal maneuvers, adenosine x 4, converted on amio gtt. Moved to CCU.  Milrinone stopped for hypotension. Started on Neo. - 3/22 had recurrent SVT restarted on amio gtt - Being treated for staph capitus bacteremia. ID following. Abx changed Ancef.  Afebrile.  Rt chest port removed 3/23.  -TEE 3/22 negative for endocarditis but did show new onset LV dysfunction on top of RV dysfunction and severe TR. LVEF now 30-35%.   Got IV Lasix 80 mg bid + metolazone yesterday. -2.8L in UOP. Wt down 7 lb.  SCr 1.62>1.82>>2.02>>2.1 >> 1.7 >> 1.48>>1.64. K 4.9   Sinus tach, low 100s, on tele. No further SVT.   Feels ok today. No complaints.    Objective:   Weight Range: 60.3 kg Body mass index is 26.85 kg/m.   Vital Signs:   Temp:  [98 F (36.7 C)-99 F (37.2 C)] 98 F (36.7 C) (03/28 0500) Pulse Rate:  [101-113] 101 (03/28 0528) Resp:  [12-20] 18 (03/28 0529) BP: (99-137)/(76-90) 99/76 (03/28 0500) SpO2:  [95 %-100 %] 99 % (03/28 0500) Weight:  [60.3 kg] 60.3 kg (03/28 0500) Last BM Date: 06/06/20  Weight change: Filed Weights   06/07/20 0607 06/08/20 0344 06/09/20 0500  Weight: 67 kg 63.2 kg 60.3 kg    Intake/Output:   Intake/Output Summary (Last 24 hours) at 06/09/2020 0714 Last data filed at 06/08/2020 2000 Gross per 24 hour  Intake 320 ml  Output 2800 ml  Net -2480 ml      Physical Exam    PHYSICAL EXAM: General: chronically ill appearing. No respiratory difficulty HEENT: normal Neck: supple. Distended neck veins, JVD to ear. Carotids 2+ bilat; no bruits. No lymphadenopathy or thyromegaly appreciated. Cor: PMI nondisplaced. Regular rhythm, mildly tachy  rate. No rubs, gallops or murmurs. Lungs: clear Abdomen: soft, nontender, nondistended. No hepatosplenomegaly. No bruits or masses. Good bowel sounds. Extremities: no cyanosis, clubbing, rash, edema Neuro: alert & oriented x 3, cranial nerves grossly intact. moves all 4 extremities w/o difficulty. Affect pleasant.     Telemetry   Sinus tach low 100s.  No further SVT (personally reviewed)  EKG    No new EKG to review   Labs    CBC Recent Labs    06/08/20 0315 06/09/20 0040  WBC 14.5* 13.6*  HGB 8.8* 9.0*  HCT 23.8* 24.3*  MCV 116.7* 116.3*  PLT 290 303   Basic Metabolic Panel Recent Labs    21/19/41 0315 06/09/20 0040  NA 132* 132*  K 5.0 4.9  CL 99 96*  CO2 25 29  GLUCOSE 92 116*  BUN 68* 61*  CREATININE 1.48* 1.64*  CALCIUM 9.1 9.1  MG 2.0 1.9   Liver Function Tests Recent Labs    06/09/20 0040  AST 67*  ALT 13  ALKPHOS 117  BILITOT 4.1*  PROT 9.1*  ALBUMIN 2.6*   No results for input(s): LIPASE, AMYLASE in the last 72 hours. Cardiac Enzymes Recent Labs    06/07/20 1206  CKTOTAL 25*    BNP: BNP (last 3 results) Recent Labs    05/27/20 1733 05/29/20 0615  BNP 1,228.5* 3,195.8*  ProBNP (last 3 results) No results for input(s): PROBNP in the last 8760 hours.   D-Dimer No results for input(s): DDIMER in the last 72 hours. Hemoglobin A1C No results for input(s): HGBA1C in the last 72 hours. Fasting Lipid Panel No results for input(s): CHOL, HDL, LDLCALC, TRIG, CHOLHDL, LDLDIRECT in the last 72 hours. Thyroid Function Tests No results for input(s): TSH, T4TOTAL, T3FREE, THYROIDAB in the last 72 hours.  Invalid input(s): FREET3  Other results:   Imaging    DG FEMUR, MIN 2 VIEWS RIGHT  Result Date: 06/08/2020 CLINICAL DATA:  RIGHT upper leg pain for 2 days, no known injury EXAM: RIGHT FEMUR 2 VIEWS COMPARISON:  None FINDINGS: Osseous demineralization. Joint spaces preserved. No acute fracture, dislocation, or bone destruction.  Few scattered pelvic phleboliths. IMPRESSION: Osseous demineralization without acute abnormalities. Electronically Signed   By: Ulyses Southward M.D.   On: 06/08/2020 10:59     Medications:     Scheduled Medications: . amiodarone  200 mg Oral BID  . Chlorhexidine Gluconate Cloth  6 each Topical Daily  . enoxaparin (LOVENOX) injection  30 mg Subcutaneous Q24H  . furosemide  80 mg Intravenous BID  . hydroxyurea  1,000 mg Oral Daily  . insulin aspart  0-15 Units Subcutaneous TID WC  . insulin aspart  0-5 Units Subcutaneous QHS  . insulin aspart  6 Units Subcutaneous TID WC  . insulin glargine  25 Units Subcutaneous QHS  . multivitamin with minerals  1 tablet Oral Daily  . sodium chloride flush  10-40 mL Intracatheter Q12H  . voxelotor  1,500 mg Oral Daily    Infusions: . sodium chloride Stopped (06/06/20 0036)  .  ceFAZolin (ANCEF) IV 2 g (06/09/20 0548)    PRN Medications: sodium chloride, acetaminophen, cyclobenzaprine, diclofenac Sodium, guaiFENesin-dextromethorphan, heparin lock flush, heparin lock flush, HYDROmorphone (DILAUDID) injection, HYDROmorphone, ondansetron (ZOFRAN) IV, sodium chloride flush   Assessment/Plan    1. 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   - No beta blocker with profound HF.  - no dig yet w/ elevated creatinine.  - She responded well to diuresis yesterday, weight down.  Still volume overloaded on exam. Scr slightly higher.  Repeat Lasix 80 mg IV bid. Hold metolazone. - she is end-stage, options as outlined above very limited options   3. AKI on CKD  - Creatinine on admit 1.5-->2.5 - Creatinine trend 1.62>1.82>>2.02>>2.1>>1.7>>1.48>1.64 - monitor w/ diuresis   4. SVT  - no further recurrence since 3/22  - now off milrinone - Continue po amiodarone.   5. Sickle Cell Anemia  - per primary team   6. TR - Severe on echo - likely functional from severely dilated RV  - ? Primary leaflet pathology such as carcinoid involvement  - TEE 3/22 negative for endocarditis   7. Hyperkalemia - Stable today, 4.9 - Lokelma PRN   8. Bacteremia - BCs 3/16 + for Staph capitus - BCs 3/17 NGTD - WBC 17>>13K.  AF  - on abx, Ancef. ID following  - TEE negative for endocarditis  - Rt Chest Port removed 3/23   9. Acute Gout - Lt Great toe - colchicine on hold w/ AKI  - Improved w/ prednisone.   10. Hypervolemic hyponatremia - Na 132 today - Continue diuresis and monitor  11. Type 2DM - glucose under better control  - on insulin, adjustments per primary team    Length of Stay: [address] PA-C  06/09/2020, 7:14 AM  Advanced Heart Failure Team Pager [phone] (M-F; 7a - 5p)  Please contact [facility] for night-coverage after hours (4p -7a ) and weekends on [email or web address]  Patient seen and examined with the above-signed Advanced Practice Provider and/or Housestaff. I personally reviewed laboratory data, imaging studies and relevant notes. I independently examined the patient and formulated the important aspects of the plan. I have edited the note to reflect any of my changes or salient points. I have personally discussed the plan with the patient and/or family.  She diuresed very well over the weekend. Weight down 19 pounds in 3 days. Now below baseline weight. But still feels overloaded. Creatinine starting to bump slowly but BUN down.   General:  Well appearing. No resp difficulty HEENT:  normal Neck: supple. JVP to jaw Carotids 2+ bilat; no bruits. No lymphadenopathy or thryomegaly appreciated. Cor: PMI nondisplaced. Regular rate & rhythm. 2/6 TR Lungs: clear Abdomen: soft, nontender, nondistended. No hepatosplenomegaly. No bruits or masses. Good bowel sounds. Extremities: no cyanosis, clubbing, rash, edema Neuro: alert & orientedx3, cranial nerves grossly intact. moves all 4 extremities w/o difficulty. Affect pleasant  She is still volume overloaded. Continue IV diuresis at least one more day. Follow renal function closely. SVT quiescent. Mag 1.9. Will supp.    Arvilla Meres, MD  11:21 AM

## 2020-06-09 NOTE — Progress Notes (Signed)
Physical Therapy Treatment Patient Details Name: Pamela Hudson MRN: [id number] DOB: 1985-09-23 Today's Date: 06/09/2020    History of Present Illness 35 y.o. female presenting [facility] ED on 05/27/2020 with LE swelling and SOB, concerned for volume overload with CHf exacerbation. Gout flare acutely. 3/20 SVT with recurrence 3/22. 3/23 port removed. PMH includes cardiomegaly, CHF, chronic pain, CVA, HTN, PCOS, sickle cell anemia, DM2, chronic right heart strain, pulmonary HTN, CKD stage 3, HTN, transient A.fib COVID 2020.    PT Comments    Pt received in supine, agreeable to therapy session and with good tolerance for gait training in hallway. Pt had x3 minor LOB to R side during gait trial with no AD and pt/RN encouraged to use RW for ambulation for safety, per pt at home she uses walls/furniture for safety but she would benefit from trial of cane next session. Pt reluctant to use RW. SpO2 WNL on RA and HR max to 116 bpm. Reviewed supine/standing HEP handout, pt receptive. Pt continues to benefit from PT services to progress toward functional mobility goals. Anticipate pt safe to DC home with family assist once medically cleared, will continue to assess DME needs next session, per pt she has RW at home.   Follow Up Recommendations  No PT follow up     Equipment Recommendations  None recommended by PT (may trial cane next session, continue to assess)    Recommendations for Other Services       Precautions / Restrictions Precautions Precautions: Fall Restrictions Weight Bearing Restrictions: No    Mobility  Bed Mobility Overal bed mobility: Needs Assistance Bed Mobility: Sit to Supine, Supine to Sit     Sit to supine/Supine to sit: Modified independent (Device/Increase time)   General bed mobility comments: increased time, able to sit from flat bed    Transfers Overall transfer level: Modified independent     Sit to Stand: Modified independent (Device/Increase time)          General transfer comment: from EOB, no AD, needs to use hands to rise  Ambulation/Gait Ambulation/Gait assistance: Min guard Gait Distance (Feet): 260 Feet Assistive device: None Gait Pattern/deviations: Step-through pattern;Decreased stride length;Decreased stance time - left;Staggering right (increased postural sway) Gait velocity: reduced   General Gait Details: minor LOB x3 to R side and pt using hallway handrail for support with excursions to R side, no buckling; min guard for safety due to postural sway/mild unsteadiness   Stairs             Wheelchair Mobility    Modified Rankin (Stroke Patients Only)       Balance Overall balance assessment: Needs assistance Sitting-balance support: No upper extremity supported;Feet supported Sitting balance-Leahy Scale: Good     Standing balance support: No upper extremity supported Standing balance-Leahy Scale: Good Standing balance comment: pt performed feet together, semi-tandem and full tandem stances EO no LOB, unable to maintain full tandem >2 seconds with eyes closed with + LOB but able to perform feet together and semi-tandem EC no LOB       Tandem Stance - Left Leg: 10 (EO/EC; 10 seconds EO; 2 sec EC)     High level balance activites: Direction changes High Level Balance Comments: try DGI next session for further assessment            Cognition Arousal/Alertness: Awake/alert Behavior During Therapy: WFL for tasks assessed/performed Overall Cognitive Status: Within Functional Limits for tasks assessed  General Comments: participatory      Exercises Other Exercises Other Exercises: pt given standing HEP handout reviewed with pt/spouse and encouraged SLR/ankle pumps in bed    General Comments General comments (skin integrity, edema, etc.): HR to 116 bpm during gait, SpO2 92-95% on RA with exertion      Pertinent Vitals/Pain Pain Assessment: No/denies  pain Pain Score: 0-No pain Pain Intervention(s): Monitored during session;Repositioned (chest binder remained donned for comfort)    Home Living                      Prior Function            PT Goals (current goals can now be found in the care plan section) Acute Rehab PT Goals Patient Stated Goal: return home PT Goal Formulation: With patient Time For Goal Achievement: 06/17/20 Potential to Achieve Goals: Good Progress towards PT goals: Progressing toward goals    Frequency    Min 3X/week      PT Plan Current plan remains appropriate    Co-evaluation              AM-PAC PT "6 Clicks" Mobility   Outcome Measure  Help needed turning from your back to your side while in a flat bed without using bedrails?: None Help needed moving from lying on your back to sitting on the side of a flat bed without using bedrails?: None Help needed moving to and from a bed to a chair (including a wheelchair)?: None Help needed standing up from a chair using your arms (e.g., wheelchair or bedside chair)?: None Help needed to walk in hospital room?: A Little Help needed climbing 3-5 steps with a railing? : A Little 6 Click Score: 22    End of Session Equipment Utilized During Treatment: Gait belt Activity Tolerance: Patient tolerated treatment well Patient left: in bed;with call bell/phone within reach;with family/visitor present;with nursing/sitter in room (sig other present) Nurse Communication: Mobility status PT Visit Diagnosis: Other abnormalities of gait and mobility (R26.89);Pain     Time: 5102-5852 PT Time Calculation (min) (ACUTE ONLY): 18 min  Charges:  $Gait Training: 8-22 mins                     Pamela Hudson P., PTA Acute Rehabilitation Services Pager: [phone] Office: [phone]   Angus Palms 06/09/2020, 5:28 PM

## 2020-06-09 NOTE — Plan of Care (Signed)
  Problem: Education: Goal: Knowledge of General Education information will improve Description: Including pain rating scale, medication(s)/side effects and non-pharmacologic comfort measures Outcome: Progressing   Problem: Health Behavior/Discharge Planning: Goal: Ability to manage health-related needs will improve Outcome: Progressing   Problem: Clinical Measurements: Goal: Ability to maintain clinical measurements within normal limits will improve Outcome: Progressing Goal: Will remain free from infection Outcome: Progressing Goal: Diagnostic test results will improve Outcome: Progressing Goal: Respiratory complications will improve Outcome: Progressing Goal: Cardiovascular complication will be avoided Outcome: Progressing   Problem: Activity: Goal: Risk for activity intolerance will decrease Outcome: Progressing   Problem: Nutrition: Goal: Adequate nutrition will be maintained Outcome: Progressing   Problem: Coping: Goal: Level of anxiety will decrease Outcome: Progressing   Problem: Elimination: Goal: Will not experience complications related to bowel motility Outcome: Progressing Goal: Will not experience complications related to urinary retention Outcome: Progressing   Problem: Pain Managment: Goal: General experience of comfort will improve Outcome: Progressing   Problem: Safety: Goal: Ability to remain free from injury will improve Outcome: Progressing   Problem: Skin Integrity: Goal: Risk for impaired skin integrity will decrease Outcome: Progressing   Problem: Education: Goal: Ability to demonstrate management of disease process will improve Outcome: Progressing Goal: Ability to verbalize understanding of medication therapies will improve Outcome: Progressing Goal: Individualized Educational Video(s) Outcome: Progressing   Problem: Activity: Goal: Capacity to carry out activities will improve Outcome: Progressing   Problem: Cardiac: Goal:  Ability to achieve and maintain adequate cardiopulmonary perfusion will improve Outcome: Progressing   Problem: Education: Goal: Knowledge of disease or condition will improve Outcome: Progressing Goal: Understanding of medication regimen will improve Outcome: Progressing Goal: Individualized Educational Video(s) Outcome: Progressing   Problem: Activity: Goal: Ability to tolerate increased activity will improve Outcome: Progressing   Problem: Cardiac: Goal: Ability to achieve and maintain adequate cardiopulmonary perfusion will improve Outcome: Progressing   Problem: Health Behavior/Discharge Planning: Goal: Ability to safely manage health-related needs after discharge will improve Outcome: Progressing   

## 2020-06-09 NOTE — Progress Notes (Signed)
PROGRESS NOTE  Pamela Hudson WUJ:[id number] DOB: 22-Jun-1985   PCP: Judd LienWebb, Pamela H, PA-C  Patient is from: Home  DOA: 05/27/2020 LOS: 13  Chief complaints: Shortness of breath, edema and weight gain  Brief Narrative / Interim history: 35 year old F with PMH of SCC, combined CHF, PAH, CKD-3B, DM-2, CVA, HTN, PCOS and COVID-19 infection 2020 presented to ED with progressive shortness of breath, edema and about 5 pound weight gain in 3 days, and admitted for acute on chronic combined CHF.   Patient underwent RHC that showed mild PAH, severe RVF with preserved CO and no evidence of intracardiac shunt.  She was a started on milrinone drip for assistance with aggressive diuresis on 3/18 but went into SVT on 3/20 requiring transfer to ICU.  Eventually, milrinone discontinued and she was a started on amiodarone infusion, and transferred back to [facility] on 06/04/2020.  Patient was also found to have Staph capitis Bacteremia thought to be from Port-A-Cath which was removed by IR on 3/23.  And started on antibiotics.  TEE negative for endocarditis on 3/22.  Plan is to continue IV Ancef through 06/11/2020.  Patient remains on IV Lasix per advanced heart failure team.   Subjective: Seen and examined earlier this morning.  No major events overnight or this morning.  Reports improvement in her right thigh pain.  She denies chest pain, shortness of breath, GI or UTI symptoms.  Objective: Vitals:   06/09/20 0528 06/09/20 0529 06/09/20 0839 06/09/20 1147  BP:   117/76 119/67  Pulse: (!) 101  (!) 106 93  Resp: 16 18 20  (!) 22  Temp:   98.8 F (37.1 C) 98.7 F (37.1 C)  TempSrc:   Oral Oral  SpO2:   90% 94%  Weight:      Height:        Intake/Output Summary (Last 24 hours) at 06/09/2020 1246 Last data filed at 06/09/2020 1200 Gross per 24 hour  Intake 200 ml  Output 4200 ml  Net -4000 ml   Filed Weights   06/07/20 0607 06/08/20 0344 06/09/20 0500  Weight: 67 kg 63.2 kg 60.3  kg    Examination:  GENERAL: No apparent distress.  Nontoxic. HEENT: MMM.  Vision and hearing grossly intact.  NECK: Supple.  No apparent JVD.  RESP: On RA.  No IWOB.  Fair aeration bilaterally. CVS:  RRR. Heart sounds normal.  ABD/GI/GU: BS+. Abd soft, NTND.  MSK/EXT:  Moves extremities. No apparent deformity. No edema.  SKIN: no apparent skin lesion or wound NEURO: Awake, alert and oriented appropriately.  No apparent focal neuro deficit. PSYCH: Calm. Normal affect.   Procedures:  3/17-RHC mild PAH, severe RVF with preserved CO and no evidence of intracardiac shunt.  3/22-TTE negative for endocarditis  Microbiology summarized: 3/15-COVID-19 and influenza PCR nonreactive 3/16-BICD with a staph species 3/16-blood culture with staph capitis 3/18-urine culture with insignificant growth 3/17-blood cultures negative 3/20-MRSA PCR screen negative 3/24-blood cultures NGTD Assessment & Plan: Acute biventricular heart failure Severe tricuspid regurgitation with core pulmonale -TTE with LVEF of 45 to 50%, severely reduced RVSF and RVSP of 34 -TEE 3/22 showed EF 30-35% and moderate to severe RV dysfunction -RHC with PCWP of 16, preserved CO and severe RVF -About 2.8 L UOP/24 hours.  Net -8.3 L so far. Cr slightly up from yesterday. -Advanced HF team managing  -IV Lasix 80 mg twice daily.  Metolazone on hold. -Monitor fluid status, renal functions and electrolytes -Not on GDMT due to decompensated heart failure  SVT-in the setting of milrinone.  Resolved. -On p.o. amiodarone per cardiology  Staph capitis bacteremia: thought to be from port a cath which was removed on 3/23.  TEE on 3/22 - for vegetation.  Repeat blood cultures on 3/17 and 3/24 negative - ID recommended Ancef through 06/11/2020.  Zyvox if discharged before 3/30  AKI on CKD-3B with azotemia: Cr slightly up today Recent Labs    06/02/20 0413 06/03/20 0024 06/03/20 1451 06/04/20 0104 06/04/20 1157 06/05/20 0543  06/06/20 0112 06/07/20 0051 06/08/20 0315 06/09/20 0040  BUN 65* 68* 67* 72* 72* 70* 77* 76* 68* 61*  CREATININE 1.78* 1.84* 1.62* 1.82* 1.80* 2.02* 2.10* 1.71* 1.48* 1.64*  -Continue monitoring  Hyperkalemia: Resolved. -Monitor  Elevated LFTs/hyperbilirubinemia: Congestive hepatitis?  Hyperbilirubinemia likely due to rapid turnover of RBC in the setting of sickle cell anemia. [place] with hepatic steatosis.  CK within normal improving. -Monitor  Controlled IDDM-2 with hypo and hyperglycemia and CKD-3B: A1c 5.2 on 3/16 but could be falsely low due to sickle cell anemia with rapid turnover of RBC. Recent Labs  Lab 06/09/20 0012 06/09/20 0445 06/09/20 0841 06/09/20 1000 06/09/20 1149  GLUCAP 127* 100* 53* 160* 158*  -Decrease Lantus from 25 to 15 units at bedtime -Continue mealtime coverage NovoLog at 6 units -Continue SSI-moderate -Further adjustment as appropriate  Hyponatremia: Na 132.  Likely in the setting of renal failure and hypervolemia. -Continue monitoring  Sickle cell anemia/right thigh pain/chronic pain:  her labs do not suggest crisis.  No acute finding on x-ray.  Pain improved.  CK within normal. Recent Labs    05/31/20 2130 06/01/20 0500 06/02/20 0413 06/03/20 0024 06/04/20 0104 06/05/20 0543 06/06/20 1033 06/07/20 0051 06/08/20 0315 06/09/20 0040  HGB 7.7* 7.4* 8.0* 8.9* 8.5* 8.5* 8.7* 8.5* 8.8* 9.0*  -Continue home hydroxyurea -Continue IV and p.o. Dilaudid -Continue Flexeril 5 mg 3 times daily as needed -Topical Voltaren gel -Patient is out of [place] and our pharmacy doesn't carry it  Acute gout: Resolved with course of prednisone. -Completed steroid taper. -May resume colchicine at renal dose  Leukocytosis/bandemia: Likely demargination from steroid.  Improving. -Continue monitoring  Overweight Body mass index is 26.85 kg/m. Nutrition Problem: Increased nutrient needs Etiology: chronic illness (CHF, CKD3b) Signs/Symptoms: estimated  needs Interventions: Ensure Enlive (each supplement provides 350kcal and 20 grams of protein),MVI   DVT prophylaxis:  enoxaparin (LOVENOX) injection 30 mg Start: 06/05/20 0800  Code Status: Full code Family Communication: Patient and/or RN. Available if any question.  Level of care: Progressive Status is: Inpatient  Remains inpatient appropriate because:IV treatments appropriate due to intensity of illness or inability to take PO and Inpatient level of care appropriate due to severity of illness   Dispo:  Patient From: Home  Planned Disposition: Home  Medically stable for discharge: No         Consultants:  Advanced heart failure team   Sch Meds:  Scheduled Meds: . amiodarone  200 mg Oral BID  . Chlorhexidine Gluconate Cloth  6 each Topical Daily  . diclofenac Sodium  2 g Topical QID  . enoxaparin (LOVENOX) injection  30 mg Subcutaneous Q24H  . furosemide  80 mg Intravenous BID  . hydroxyurea  1,000 mg Oral Daily  . insulin aspart  0-15 Units Subcutaneous TID WC  . insulin aspart  0-5 Units Subcutaneous QHS  . insulin aspart  6 Units Subcutaneous TID WC  . insulin glargine  15 Units Subcutaneous QHS  . multivitamin with minerals  1 tablet Oral Daily  .  sodium chloride flush  10-40 mL Intracatheter Q12H  . voxelotor  1,500 mg Oral Daily   Continuous Infusions: . sodium chloride Stopped (06/06/20 0036)  .  ceFAZolin (ANCEF) IV 2 g (06/09/20 0548)   PRN Meds:.sodium chloride, acetaminophen, cyclobenzaprine, guaiFENesin-dextromethorphan, heparin lock flush, heparin lock flush, HYDROmorphone (DILAUDID) injection, HYDROmorphone, ondansetron (ZOFRAN) IV, sodium chloride flush  Antimicrobials: Anti-infectives (From admission, onward)   Start     Dose/Rate Route Frequency Ordered Stop   06/02/20 1400  ceFAZolin (ANCEF) IVPB 2g/100 mL premix        2 g 200 mL/hr over 30 Minutes Intravenous Every 8 hours 06/02/20 0943 06/11/20 2359   06/01/20 1430  vancomycin (VANCOREADY)  IVPB 750 mg/150 mL  Status:  Discontinued        750 mg 150 mL/hr over 60 Minutes Intravenous Every 24 hours 06/01/20 1335 06/02/20 0943   06/01/20 1000  vancomycin (VANCOREADY) IVPB 750 mg/150 mL  Status:  Discontinued        750 mg 150 mL/hr over 60 Minutes Intravenous Every 24 hours 05/31/20 0901 05/31/20 1701   05/31/20 0930  vancomycin (VANCOREADY) IVPB 1000 mg/200 mL  Status:  Discontinued       Note to Pharmacy: Dosing per pharmacy   1,000 mg 200 mL/hr over 60 Minutes Intravenous Every 12 hours 05/31/20 0830 05/31/20 0837   05/31/20 0900  vancomycin (VANCOREADY) IVPB 1250 mg/250 mL        1,250 mg 166.7 mL/hr over 90 Minutes Intravenous  Once 05/31/20 0855 05/31/20 1300   05/30/20 1500  ceFAZolin (ANCEF) IVPB 2g/100 mL premix  Status:  Discontinued        2 g 200 mL/hr over 30 Minutes Intravenous Every 12 hours 05/30/20 1328 05/31/20 0855       I have personally reviewed the following labs and images: CBC: Recent Labs  Lab 06/03/20 0024 06/04/20 0104 06/05/20 0543 06/06/20 1033 06/07/20 0051 06/08/20 0315 06/09/20 0040  WBC 15.7* 16.9* 15.7* 14.9* 16.8* 14.5* 13.6*  NEUTROABS 12.4* 13.2* 12.4*  --   --   --   --   HGB 8.9* 8.5* 8.5* 8.7* 8.5* 8.8* 9.0*  HCT 23.4* 23.2* 22.5* 23.4* 23.1* 23.8* 24.3*  MCV 112.5* 114.9* 114.2* 115.8* 114.4* 116.7* 116.3*  PLT 191 275 270 283 271 290 303   BMP &GFR Recent Labs  Lab 06/05/20 0543 06/06/20 0112 06/07/20 0051 06/08/20 0315 06/09/20 0040  NA 129* 131* 133* 132* 132*  K 5.3* 4.8 4.7 5.0 4.9  CL 102 101 104 99 96*  CO2 18* 22 21* 25 29  GLUCOSE 283* 269* 84 92 116*  BUN 70* 77* 76* 68* 61*  CREATININE 2.02* 2.10* 1.71* 1.48* 1.64*  CALCIUM 8.7* 8.5* 8.9 9.1 9.1  MG 2.4  --  2.1 2.0 1.9   Estimated Creatinine Clearance: 38.5 mL/min (A) (by C-G formula based on SCr of 1.64 mg/dL (H)). Liver & Pancreas: Recent Labs  Lab 06/05/20 0543 06/09/20 0040  AST 61* 67*  ALT 15 13  ALKPHOS 134* 117  BILITOT 3.5* 4.1*   PROT 9.3* 9.1*  ALBUMIN 2.6* 2.6*   No results for input(s): LIPASE, AMYLASE in the last 168 hours. No results for input(s): AMMONIA in the last 168 hours. Diabetic: No results for input(s): HGBA1C in the last 72 hours. Recent Labs  Lab 06/09/20 0012 06/09/20 0445 06/09/20 0841 06/09/20 1000 06/09/20 1149  GLUCAP 127* 100* 53* 160* 158*   Cardiac Enzymes: Recent Labs  Lab 06/07/20 1206  CKTOTAL 25*  No results for input(s): PROBNP in the last 8760 hours. Coagulation Profile: No results for input(s): INR, PROTIME in the last 168 hours. Thyroid Function Tests: No results for input(s): TSH, T4TOTAL, FREET4, T3FREE, THYROIDAB in the last 72 hours. Lipid Profile: No results for input(s): CHOL, HDL, LDLCALC, TRIG, CHOLHDL, LDLDIRECT in the last 72 hours. Anemia Panel: Recent Labs    06/07/20 1206  VITAMINB12 467  FOLATE 11.9  FERRITIN 6,999*  TIBC 153*  IRON 133  RETICCTPCT 7.5*   Urine analysis:    Component Value Date/Time   COLORURINE YELLOW 05/27/2020 1610   APPEARANCEUR CLEAR 05/27/2020 1610   LABSPEC 1.010 05/27/2020 1610   PHURINE 5.5 05/27/2020 1610   GLUCOSEU >=500 (A) 05/27/2020 1610   HGBUR NEGATIVE 05/27/2020 1610   BILIRUBINUR NEGATIVE 05/27/2020 1610   KETONESUR NEGATIVE 05/27/2020 1610   PROTEINUR NEGATIVE 05/27/2020 1610   UROBILINOGEN 0.2 05/15/2013 2130   NITRITE NEGATIVE 05/27/2020 1610   LEUKOCYTESUR NEGATIVE 05/27/2020 1610   Sepsis Labs: Invalid input(s): PROCALCITONIN, LACTICIDVEN  Microbiology: Recent Results (from the past 240 hour(s))  MRSA PCR Screening     Status: None   Collection Time: 06/01/20  8:40 PM   Specimen: Nasopharyngeal  Result Value Ref Range Status   MRSA by PCR NEGATIVE NEGATIVE Final    Comment:        The GeneXpert MRSA Assay (FDA approved for NASAL specimens only), is one component of a comprehensive MRSA colonization surveillance program. It is not intended to diagnose MRSA infection nor to guide  or monitor treatment for MRSA infections. Performed at [facility], [address] [place], [place] [address]   Culture, blood (Routine X 2) w Reflex to ID Panel     Status: None (Preliminary result)   Collection Time: 06/05/20 10:08 AM   Specimen: Left Antecubital; Blood  Result Value Ref Range Status   Specimen Description LEFT ANTECUBITAL  Final   Special Requests   Final    BOTTLES DRAWN AEROBIC AND ANAEROBIC Blood Culture adequate volume   Culture   Final    NO GROWTH 4 DAYS Performed at [facility], [address] [place], [place] [address]    Report Status PENDING  Incomplete  Culture, blood (Routine X 2) w Reflex to ID Panel     Status: None (Preliminary result)   Collection Time: 06/05/20 10:24 AM   Specimen: BLOOD RIGHT HAND  Result Value Ref Range Status   Specimen Description BLOOD RIGHT HAND  Final   Special Requests   Final    BOTTLES DRAWN AEROBIC AND ANAEROBIC Blood Culture adequate volume   Culture   Final    NO GROWTH 4 DAYS Performed at [facility], [address] [place], [place] [address]    Report Status PENDING  Incomplete    Radiology Studies: No results found.    Taye T. Gonfa [facility]  If 7PM-7AM, please contact night-coverage [email or web address] 06/09/2020, 12:46 PM

## 2020-06-09 NOTE — Progress Notes (Signed)
Hypoglycemic Event  CBG: 53  Treatment: 240 mL of orange juice  Symptoms: 0  Follow-up CBG: Time:1000 CBG Result: 160  Possible Reasons for Event:   Comments/MD notified:CBG came up after orange juice.     Pamela Hudson [name]

## 2020-06-10 DIAGNOSIS — R7881 Bacteremia: Secondary | ICD-10-CM | POA: Diagnosis not present

## 2020-06-10 LAB — BASIC METABOLIC PANEL
Anion gap: 8 (ref 5–15)
BUN: 59 mg/dL — ABNORMAL HIGH (ref 6–20)
CO2: 31 mmol/L (ref 22–32)
Calcium: 8.8 mg/dL — ABNORMAL LOW (ref 8.9–10.3)
Chloride: 94 mmol/L — ABNORMAL LOW (ref 98–111)
Creatinine, Ser: 1.6 mg/dL — ABNORMAL HIGH (ref 0.44–1.00)
GFR, Estimated: 43 mL/min — ABNORMAL LOW (ref >60)
Glucose, Bld: 107 mg/dL — ABNORMAL HIGH (ref 70–99)
Potassium: 4.5 mmol/L (ref 3.5–5.1)
Sodium: 133 mmol/L — ABNORMAL LOW (ref 135–145)

## 2020-06-10 LAB — CBC
HCT: 24.2 % — ABNORMAL LOW (ref 36.0–46.0)
Hemoglobin: 8.8 g/dL — ABNORMAL LOW (ref 12.0–15.0)
MCH: 42.7 pg — ABNORMAL HIGH (ref 26.0–34.0)
MCHC: 36.4 g/dL — ABNORMAL HIGH (ref 30.0–36.0)
MCV: 117.5 fL — ABNORMAL HIGH (ref 80.0–100.0)
Platelets: 308 10*3/uL (ref 150–400)
RBC: 2.06 MIL/uL — ABNORMAL LOW (ref 3.87–5.11)
RDW: 21.2 % — ABNORMAL HIGH (ref 11.5–15.5)
WBC: 19.8 10*3/uL — ABNORMAL HIGH (ref 4.0–10.5)
nRBC: 4.4 % — ABNORMAL HIGH (ref 0.0–0.2)

## 2020-06-10 LAB — CULTURE, BLOOD (ROUTINE X 2)
Culture: NO GROWTH
Culture: NO GROWTH
Special Requests: ADEQUATE
Special Requests: ADEQUATE

## 2020-06-10 LAB — GLUCOSE, CAPILLARY
Glucose-Capillary: 104 mg/dL — ABNORMAL HIGH (ref 70–99)
Glucose-Capillary: 108 mg/dL — ABNORMAL HIGH (ref 70–99)
Glucose-Capillary: 131 mg/dL — ABNORMAL HIGH (ref 70–99)
Glucose-Capillary: 191 mg/dL — ABNORMAL HIGH (ref 70–99)
Glucose-Capillary: 216 mg/dL — ABNORMAL HIGH (ref 70–99)
Glucose-Capillary: 219 mg/dL — ABNORMAL HIGH (ref 70–99)
Glucose-Capillary: 236 mg/dL — ABNORMAL HIGH (ref 70–99)
Glucose-Capillary: 36 mg/dL — CL (ref 70–99)
Glucose-Capillary: 97 mg/dL (ref 70–99)

## 2020-06-10 LAB — MAGNESIUM: Magnesium: 2 mg/dL (ref 1.7–2.4)

## 2020-06-10 MED ORDER — INSULIN GLARGINE 100 UNIT/ML ~~LOC~~ SOLN
7.0000 [IU] | Freq: Every day | SUBCUTANEOUS | Status: DC
  Administered 2020-06-10: 7 [IU] via SUBCUTANEOUS
  Filled 2020-06-10 (×2): qty 0.07

## 2020-06-10 MED ORDER — AMIODARONE HCL 200 MG PO TABS
200.0000 mg | ORAL_TABLET | Freq: Every day | ORAL | Status: DC
Start: 2020-06-11 — End: 2020-06-11
  Administered 2020-06-11: 200 mg via ORAL
  Filled 2020-06-10: qty 1

## 2020-06-10 MED ORDER — METOPROLOL SUCCINATE ER 25 MG PO TB24: 12.5000 mg | ORAL_TABLET | Freq: Every day | ORAL | Status: DC

## 2020-06-10 MED ORDER — METOPROLOL SUCCINATE ER 25 MG PO TB24: 25.0000 mg | ORAL_TABLET | Freq: Every day | ORAL | Status: DC

## 2020-06-10 MED ORDER — TORSEMIDE 20 MG PO TABS
40.0000 mg | ORAL_TABLET | Freq: Every day | ORAL | Status: DC
  Administered 2020-06-10 – 2020-06-11 (×2): 40 mg via ORAL
  Filled 2020-06-10 (×2): qty 2

## 2020-06-10 NOTE — Progress Notes (Signed)
Progress Note    Pamela Hudson  XHB:[id number] DOB: Apr 24, 1985  DOA: 05/27/2020 PCP: Pamela Lien, PA-C    Brief Narrative:     Medical records reviewed and are as summarized below:  Pamela Hudson is an 35 y.o. female  with PMH of SCC, combined CHF, PAH, CKD-3B, DM-2, CVA, HTN, PCOS and COVID-19 infection 2020 presented to ED with progressive shortness of breath, edema and about 5 pound weight gain in 3 days, and admitted for acute on chronic combined CHF.  Patient underwent RHC that showed mild PAH, severe RVF with preserved CO and no evidence of intracardiac shunt.  She was a started on milrinone drip for assistance with aggressive diuresis on 3/18 but went into SVT on 3/20 requiring transfer to ICU.  Eventually, milrinone discontinued and she was a started on amiodarone infusion, and transferred back to [facility] on 06/04/2020. Patient was also found to have Staph capitis Bacteremia thought to be from Port-A-Cath which was removed by IR on 3/23.  And started on antibiotics.  TEE negative for endocarditis on 3/22.  Plan is to continue IV Ancef through 06/11/2020.  Assessment/Plan:   Principal Problem:   Bacteremia Active Problems:   Sickle cell anemia (HCC)   Elevated bilirubin   Leukocytosis   Dyspnea   Acute on chronic right-sided heart failure (HCC)   Chronic right-sided heart failure (HCC)   Pulmonary hypertension (HCC)   CKD (chronic kidney disease)   DM2 (diabetes mellitus, type 2) (HCC)   SVT (supraventricular tachycardia) (HCC)   Acute biventricular heart failure Severe tricuspid regurgitation with core pulmonale -TTE with LVEF of 45 to 50%, severely reduced RVSF and RVSP of 34 -TEE 3/22 showed EF 30-35% and moderate to severe RV dysfunction -RHC with PCWP of 16, preserved CO and severe RVF -About 2.8 L UOP/24 hours.  Net -8.3 L so far. Cr slightly up from yesterday. -Advanced HF team managing: - she is end-stage, options limited              -IV  Lasix 80 mg twice daily.  Metolazone on hold. -Monitor fluid status, renal functions and electrolytes -Not on GDMT due to decompensated heart failure  SVT-in the setting of milrinone.  Resolved. -On p.o. amiodarone per cardiology  Staph capitis bacteremia: thought to be from port a cath which was removed on 3/23.  TEE on 3/22 - for vegetation.  Repeat blood cultures on 3/17 and 3/24 negative - ID recommended Ancef through 06/11/2020.    AKI on CKD-3B with azotemia:  -daily labs  Hyperkalemia: Resolved. -Monitor  Elevated LFTs/hyperbilirubinemia:  - [place] with hepatic steatosis.  CK within normal improving. -Monitor  Controlled IDDM-2 with hypo and hyperglycemia and CKD-3B: A1c 5.2 on 3/16 but could be falsely low due to sickle cell anemia with rapid turnover of RBC. -Decrease Lantus  -Continue mealtime coverage NovoLog at 6 units -Continue SSI-moderate -Further adjustment as appropriate  Hyponatremia: Na 132.  Likely in the setting of renal failure and hypervolemia. -Continue monitoring  Sickle cell anemia/right thigh pain/chronic pain:  her labs do not suggest crisis.  No acute finding on x-ray.  Pain improved.  CK within normal.-Continue home hydroxyurea -Continue IV and p.o. Dilaudid -Continue Flexeril 5 mg 3 times daily as needed -Topical Voltaren gel -Patient is out of [place] and our pharmacy doesn't carry it  Acute gout: Resolved with course of prednisone. -Completed steroid taper.  Leukocytosis/bandemia: Likely demargination from steroid.  Improving. -Continue monitoring    Family Communication/Anticipated  D/C date and plan/Code Status   DVT prophylaxis: Lovenox ordered. Code Status: Full Code.  Disposition Plan: Status is: Inpatient  Remains inpatient appropriate because:Inpatient level of care appropriate due to severity of illness   Dispo:  Patient From: Home  Planned Disposition: Home  Medically stable for discharge: No            Medical Consultants:    IR  CHF team   Subjective:   No overnight events  Objective:    Vitals:   06/10/20 0500 06/10/20 0702 06/10/20 0704 06/10/20 0800  BP:    110/75  Pulse:    (!) 107  Resp:    16  Temp:   99.5 F (37.5 C) 99 F (37.2 C)  TempSrc:  Axillary Axillary Oral  SpO2:    93%  Weight: 58.1 kg     Height:        Intake/Output Summary (Last 24 hours) at 06/10/2020 0956 Last data filed at 06/09/2020 2300 Gross per 24 hour  Intake 637 ml  Output 2425 ml  Net -1788 ml   Filed Weights   06/08/20 0344 06/09/20 0500 06/10/20 0500  Weight: 63.2 kg 60.3 kg 58.1 kg    Exam:  General: Appearance:     Overweight female in no acute distress, laying flat in bed     Lungs:      respirations unlabored  Heart:    Tachycardic. Normal rhythm. No murmurs, rubs, or gallops.   MS:   All extremities are intact.   Neurologic:   Awake, alert, oriented x 3. No apparent focal neurological           defect.     Data Reviewed:   I have personally reviewed following labs and imaging studies:  Labs: Labs show the following:   Basic Metabolic Panel: Recent Labs  Lab 06/05/20 0543 06/06/20 0112 06/07/20 0051 06/08/20 0315 06/09/20 0040 06/10/20 0041  NA 129* 131* 133* 132* 132* 133*  K 5.3* 4.8 4.7 5.0 4.9 4.5  CL 102 101 104 99 96* 94*  CO2 18* 22 21* 25 29 31   GLUCOSE 283* 269* 84 92 116* 107*  BUN 70* 77* 76* 68* 61* 59*  CREATININE 2.02* 2.10* 1.71* 1.48* 1.64* 1.60*  CALCIUM 8.7* 8.5* 8.9 9.1 9.1 8.8*  MG 2.4  --  2.1 2.0 1.9 2.0   GFR Estimated Creatinine Clearance: 38.8 mL/min (A) (by C-G formula based on SCr of 1.6 mg/dL (H)). Liver Function Tests: Recent Labs  Lab 06/05/20 0543 06/09/20 0040  AST 61* 67*  ALT 15 13  ALKPHOS 134* 117  BILITOT 3.5* 4.1*  PROT 9.3* 9.1*  ALBUMIN 2.6* 2.6*   No results for input(s): LIPASE, AMYLASE in the last 168 hours. No results for input(s): AMMONIA in the last 168 hours. Coagulation profile No  results for input(s): INR, PROTIME in the last 168 hours.  CBC: Recent Labs  Lab 06/04/20 0104 06/05/20 0543 06/06/20 1033 06/07/20 0051 06/08/20 0315 06/09/20 0040 06/10/20 0041  WBC 16.9* 15.7* 14.9* 16.8* 14.5* 13.6* 19.8*  NEUTROABS 13.2* 12.4*  --   --   --   --   --   HGB 8.5* 8.5* 8.7* 8.5* 8.8* 9.0* 8.8*  HCT 23.2* 22.5* 23.4* 23.1* 23.8* 24.3* 24.2*  MCV 114.9* 114.2* 115.8* 114.4* 116.7* 116.3* 117.5*  PLT 275 270 283 271 290 303 308   Cardiac Enzymes: Recent Labs  Lab 06/07/20 1206  CKTOTAL 25*   BNP (last 3 results) No results for  input(s): PROBNP in the last 8760 hours. CBG: Recent Labs  Lab 06/10/20 0012 06/10/20 0447 06/10/20 0513 06/10/20 0620 06/10/20 0759  GLUCAP 108* 36* 104* 219* 216*   D-Dimer: No results for input(s): DDIMER in the last 72 hours. Hgb A1c: No results for input(s): HGBA1C in the last 72 hours. Lipid Profile: No results for input(s): CHOL, HDL, LDLCALC, TRIG, CHOLHDL, LDLDIRECT in the last 72 hours. Thyroid function studies: No results for input(s): TSH, T4TOTAL, T3FREE, THYROIDAB in the last 72 hours.  Invalid input(s): FREET3 Anemia work up: Recent Labs    06/07/20 1206  VITAMINB12 467  FOLATE 11.9  FERRITIN 6,999*  TIBC 153*  IRON 133  RETICCTPCT 7.5*   Sepsis Labs: Recent Labs  Lab 06/07/20 0051 06/08/20 0315 06/09/20 0040 06/10/20 0041  WBC 16.8* 14.5* 13.6* 19.8*    Microbiology Recent Results (from the past 240 hour(s))  MRSA PCR Screening     Status: None   Collection Time: 06/01/20  8:40 PM   Specimen: Nasopharyngeal  Result Value Ref Range Status   MRSA by PCR NEGATIVE NEGATIVE Final    Comment:        The GeneXpert MRSA Assay (FDA approved for NASAL specimens only), is one component of a comprehensive MRSA colonization surveillance program. It is not intended to diagnose MRSA infection nor to guide or monitor treatment for MRSA infections. Performed at [facility], [address] [place], [place] [address]   Culture, blood (Routine X 2) w Reflex to ID Panel     Status: None   Collection Time: 06/05/20 10:08 AM   Specimen: Left Antecubital; Blood  Result Value Ref Range Status   Specimen Description LEFT ANTECUBITAL  Final   Special Requests   Final    BOTTLES DRAWN AEROBIC AND ANAEROBIC Blood Culture adequate volume   Culture   Final    NO GROWTH 5 DAYS Performed at [facility], [address] [place], [place] [address]    Report Status 06/10/2020 FINAL  Final  Culture, blood (Routine X 2) w Reflex to ID Panel     Status: None   Collection Time: 06/05/20 10:24 AM   Specimen: BLOOD RIGHT HAND  Result Value Ref Range Status   Specimen Description BLOOD RIGHT HAND  Final   Special Requests   Final    BOTTLES DRAWN AEROBIC AND ANAEROBIC Blood Culture adequate volume   Culture   Final    NO GROWTH 5 DAYS Performed at [facility], [address], [place], [place] [address]    Report Status 06/10/2020 FINAL  Final    Procedures and diagnostic studies:  No results found.  Medications:   . amiodarone  200 mg Oral BID  . Chlorhexidine Gluconate Cloth  6 each Topical Daily  . Deferasirox  540 mg Oral QHS  . diclofenac Sodium  2 g Topical QID  . enoxaparin (LOVENOX) injection  30 mg Subcutaneous Q24H  . hydroxyurea  1,000 mg Oral Daily  . insulin aspart  0-15 Units Subcutaneous TID WC  . insulin aspart  0-5 Units Subcutaneous QHS  . insulin aspart  6 Units Subcutaneous TID WC  . insulin glargine  7 Units Subcutaneous QHS  . multivitamin with minerals  1 tablet Oral Daily  . sodium chloride flush  10-40 mL Intracatheter Q12H  . voxelotor  1,500 mg Oral Daily   Continuous Infusions: . sodium chloride Stopped (06/06/20 0036)  .  ceFAZolin (ANCEF) IV 2 g (06/10/20  8280)     LOS: 14 days   Joseph Art  [facility]   How to contact the [facility] Attending or Consulting provider 7A - 7P or covering provider during after hours 7P  -7A, for this patient?  1. Check the care team in [facility] and look for a) attending/consulting [facility] provider listed and b) the [facility] team listed 2. Log into [email or web address] and use [facility]'s universal password to access. If you do not have the password, please contact the hospital operator. 3. Locate the [facility] provider you are looking for under [facility] and page to a number that you can be directly reached. 4. If you still have difficulty reaching the provider, please page the [facility] (Director on Call) for the Hospitalists listed on amion for assistance.  06/10/2020, 9:56 AM

## 2020-06-10 NOTE — Progress Notes (Addendum)
Patient ID: Pamela Hudson, female   DOB: [birth year], 35 y.o.   MRN: [id number]     Advanced Heart Failure Rounding Note  PCP-Cardiologist: No primary care provider on file.   Subjective:    - 3/17 RHC Mild PAH RA 23 PA 35/14 (26) PCWP 16 CO 6.9 CI 5.8  - 3/20 Developed hemodynamically unable SVT, HR in the 190s. Failed vagal maneuvers, adenosine x 4, converted on amio gtt. Moved to CCU.  Milrinone stopped for hypotension. Started on Neo. - 3/22 had recurrent SVT restarted on amio gtt - Being treated for staph capitus bacteremia. ID following. Abx changed Ancef.  Afebrile.  Rt chest port removed 3/23.  -TEE 3/22 negative for endocarditis but did show new onset LV dysfunction on top of RV dysfunction and severe TR. LVEF now 30-35%.   Yesterday diuresed with IV lasix. Negative 2.3 liters. Weight down 4 pounds.    Cr 1.6>1.6   K 4.5  Feeling better. Denies SOB.     Objective:   Weight Range: 58.1 kg Body mass index is 25.87 kg/m.   Vital Signs:   Temp:  [98.7 F (37.1 C)-99.5 F (37.5 C)] 99 F (37.2 C) (03/29 0800) Pulse Rate:  [93-113] 107 (03/29 0800) Resp:  [12-26] 16 (03/29 0800) BP: (102-119)/(67-77) 110/75 (03/29 0800) SpO2:  [91 %-99 %] 93 % (03/29 0800) Weight:  [58.1 kg] 58.1 kg (03/29 0500) Last BM Date: 06/06/20  Weight change: Filed Weights   06/08/20 0344 06/09/20 0500 06/10/20 0500  Weight: 63.2 kg 60.3 kg 58.1 kg    Intake/Output:   Intake/Output Summary (Last 24 hours) at 06/10/2020 1025 Last data filed at 06/09/2020 2300 Gross per 24 hour  Intake 637 ml  Output 2425 ml  Net -1788 ml      Physical Exam    PHYSICAL EXAM: General:  In bed.  No resp difficulty HEENT: normal Neck: supple. JVP 5-6 . Carotids 2+ bilat; no bruits. No lymphadenopathy or thryomegaly appreciated. Cor: PMI nondisplaced. Regular rate & rhythm. No rubs, gallops or murmurs. Lungs: clear Abdomen: soft, nontender, nondistended. No hepatosplenomegaly. No bruits or masses.  Good bowel sounds. Extremities: no cyanosis, clubbing, rash, edema Neuro: alert & orientedx3, cranial nerves grossly intact. moves all 4 extremities w/o difficulty. Affect pleasant     Telemetry   St tach 100s   EKG    No new EKG to review   Labs    CBC Recent Labs    06/09/20 0040 06/10/20 0041  WBC 13.6* 19.8*  HGB 9.0* 8.8*  HCT 24.3* 24.2*  MCV 116.3* 117.5*  PLT 303 308   Basic Metabolic Panel Recent Labs    40/98/11 0040 06/10/20 0041  NA 132* 133*  K 4.9 4.5  CL 96* 94*  CO2 29 31  GLUCOSE 116* 107*  BUN 61* 59*  CREATININE 1.64* 1.60*  CALCIUM 9.1 8.8*  MG 1.9 2.0   Liver Function Tests Recent Labs    06/09/20 0040  AST 67*  ALT 13  ALKPHOS 117  BILITOT 4.1*  PROT 9.1*  ALBUMIN 2.6*   No results for input(s): LIPASE, AMYLASE in the last 72 hours. Cardiac Enzymes Recent Labs    06/07/20 1206  CKTOTAL 25*    BNP: BNP (last 3 results) Recent Labs    05/27/20 1733 05/29/20 0615  BNP 1,228.5* 3,195.8*    ProBNP (last 3 results) No results for input(s): PROBNP in the last 8760 hours.   D-Dimer No results for input(s): DDIMER in the last 72  hours. Hemoglobin A1C No results for input(s): HGBA1C in the last 72 hours. Fasting Lipid Panel No results for input(s): CHOL, HDL, LDLCALC, TRIG, CHOLHDL, LDLDIRECT in the last 72 hours. Thyroid Function Tests No results for input(s): TSH, T4TOTAL, T3FREE, THYROIDAB in the last 72 hours.  Invalid input(s): FREET3  Other results:   Imaging    No results found.   Medications:     Scheduled Medications: . amiodarone  200 mg Oral BID  . Chlorhexidine Gluconate Cloth  6 each Topical Daily  . Deferasirox  540 mg Oral QHS  . diclofenac Sodium  2 g Topical QID  . enoxaparin (LOVENOX) injection  30 mg Subcutaneous Q24H  . hydroxyurea  1,000 mg Oral Daily  . insulin aspart  0-15 Units Subcutaneous TID WC  . insulin aspart  0-5 Units Subcutaneous QHS  . insulin aspart  6 Units  Subcutaneous TID WC  . insulin glargine  7 Units Subcutaneous QHS  . multivitamin with minerals  1 tablet Oral Daily  . sodium chloride flush  10-40 mL Intracatheter Q12H  . voxelotor  1,500 mg Oral Daily    Infusions: . sodium chloride Stopped (06/06/20 0036)  .  ceFAZolin (ANCEF) IV 2 g (06/10/20 0508)    PRN Medications: sodium chloride, acetaminophen, cyclobenzaprine, guaiFENesin-dextromethorphan, heparin lock flush, heparin lock flush, HYDROmorphone (DILAUDID) injection, HYDROmorphone, ondansetron (ZOFRAN) IV, sodium chloride flush   Assessment/Plan    1. 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   - Volume status much improved. Appear euvolemic. Stop IV lasix and start torsemide 40 mg daily.  - Renal function improving. Hold off on digoxin.  - - she is end-stage, options as outlined above very limited options   3. AKI on CKD  - Creatinine on admit 1.5-->2.5 peaked, today 1.6.   4. SVT  - no further recurrence since 3/22  - now off milrinone - Cut back amio to 200 mg daily.   5. Sickle Cell Anemia  - per primary team   6. TR - Severe on echo - likely functional from severely dilated RV  - ? Primary leaflet pathology such as carcinoid involvement  - TEE 3/22 negative for endocarditis   7. Hyperkalemia - Stable today, 4.5  8. Bacteremia - BCs 3/16 + for Staph capitus - BCs 3/17 NGTD - WBC 19.8  T Max 99.5  - on abx, Ancef. ID  following  - TEE negative for endocarditis  - Rt Chest Port removed 3/23   9. Acute Gout - Lt Great toe - colchicine on hold w/ AKI  - Improved w/ prednisone.   10. Hypervolemic hyponatremia - Na 133 today - Continue diuresis and monitor  11. Type 2DM - glucose under better control  - on insulin, adjustments per primary team    HF stable. HF meds for d/c torsemide 40 mg daily, amio 200 mg daily.     Length of Stay: 14  Tonye Becket, NP  06/10/2020,  10:25 AM  Advanced Heart Failure Team Pager [phone] (M-F; 7a - 5p)  Please contact [facility] for night-coverage after hours (4p -7a ) and weekends on [email or web address]  Patient seen and examined with the above-signed Advanced Practice Provider and/or Housestaff. I personally reviewed laboratory data, imaging studies and relevant notes. I independently examined the patient and formulated the important aspects of the plan. I have edited the note to reflect any of my changes or salient points. I have personally discussed the plan with the patient and/or family.  She has diuresed well. Weight down 23 pounds. Breathing better. Creatinine stable. No orthopnea or PND.   General:  Lying flat No resp difficulty HEENT: normal Neck: supple. nJVP 8-9. Carotids 2+ bilat; no bruits. No lymphadenopathy or thryomegaly appreciated. Cor: PMI nondisplaced. Regular rate & rhythm. 2/6 TR Lungs: clear Abdomen: soft, nontender, nondistended. No hepatosplenomegaly. No bruits or masses. Good bowel sounds. Extremities: no cyanosis, clubbing, rash, edema Neuro: alert & orientedx3, cranial nerves grossly intact. moves all 4 extremities w/o difficulty. Affect pleasant  Volume status much improved. Will switch IV lasix to po. Decrease amio to 200 po bid. HF team will s/o. Will arrange outpatient f/u.   Arvilla Meres, MD  1:50 PM

## 2020-06-10 NOTE — Progress Notes (Signed)
Nutrition Follow-up  DOCUMENTATION CODES:   Not applicable  INTERVENTION:   Continue snacks TID between meals  NUTRITION DIAGNOSIS:   Increased nutrient needs related to chronic illness (CHF, CKD3b) as evidenced by estimated needs.  Ongoing  GOAL:   Patient will meet greater than or equal to 90% of their needs  Progressing  MONITOR:   PO intake,Supplement acceptance,Skin,I & O's,Labs,Weight trends  REASON FOR ASSESSMENT:   Consult Assessment of nutrition requirement/status  ASSESSMENT:   24 YOF admitted for sickle cell anemia. PMH of CVA, sickle cell anemia, PCOS, chronic pain, DM2, CKD3b, cardiomegaly, liver disease, HTN, CHF, cholecystectomy.   3/22- TEE negative for endocarditis, showed LV/RV dysfunction and severe TR  Appetite fluctuates. Last five meal completions charted as 50%, 100%, 0%, 0%, and 80%. Patient does not like supplements. Has not received ordered snacks. RD brought snack from fridge to patient. She was appreciative. Continue snacks and encourage meal intake.    Admission weight: 63.8 kg  Current weight: 58.1 kg   UOP: 3025 ml x 24 hrs   Medications: SS novolog, lantus, demadex Labs: Na 133 (L) CBG 36-236  Diet Order:   Diet Order            Diet heart healthy/carb modified Room service appropriate? Yes; Fluid consistency: Thin  Diet effective now                 EDUCATION NEEDS:   No education needs have been identified at this time  Skin:  Skin Assessment: Reviewed RN Assessment  Last BM:  3/25  Height:   Ht Readings from Last 1 Encounters:  05/27/20 4\' 11"  (1.499 m)    Weight:   Wt Readings from Last 1 Encounters:  06/10/20 58.1 kg    Ideal Body Weight:  44.7 kg  BMI:  Body mass index is 25.87 kg/m.  Estimated Nutritional Needs:   Kcal:  1900-2100 kcals  Protein:  95-105 g  Fluid:  >/= 1.9 L   06/12/20 RD, LDN Clinical Nutrition Pager listed in AMION

## 2020-06-10 NOTE — Progress Notes (Signed)
Patient on q4h blood sugar checks. At 0455, blood sugar was 36. Hypoglycemia protocol was instituted.

## 2020-06-11 DIAGNOSIS — R0601 Orthopnea: Secondary | ICD-10-CM

## 2020-06-11 LAB — CBC
HCT: 23.8 % — ABNORMAL LOW (ref 36.0–46.0)
Hemoglobin: 8.4 g/dL — ABNORMAL LOW (ref 12.0–15.0)
MCH: 42.4 pg — ABNORMAL HIGH (ref 26.0–34.0)
MCHC: 35.3 g/dL (ref 30.0–36.0)
MCV: 120.2 fL — ABNORMAL HIGH (ref 80.0–100.0)
Platelets: 293 10*3/uL (ref 150–400)
RBC: 1.98 MIL/uL — ABNORMAL LOW (ref 3.87–5.11)
RDW: 21.7 % — ABNORMAL HIGH (ref 11.5–15.5)
WBC: 16.7 10*3/uL — ABNORMAL HIGH (ref 4.0–10.5)
nRBC: 4.6 % — ABNORMAL HIGH (ref 0.0–0.2)

## 2020-06-11 LAB — BASIC METABOLIC PANEL
Anion gap: 9 (ref 5–15)
BUN: 62 mg/dL — ABNORMAL HIGH (ref 6–20)
CO2: 30 mmol/L (ref 22–32)
Calcium: 8.4 mg/dL — ABNORMAL LOW (ref 8.9–10.3)
Chloride: 90 mmol/L — ABNORMAL LOW (ref 98–111)
Creatinine, Ser: 1.83 mg/dL — ABNORMAL HIGH (ref 0.44–1.00)
GFR, Estimated: 37 mL/min — ABNORMAL LOW (ref >60)
Glucose, Bld: 161 mg/dL — ABNORMAL HIGH (ref 70–99)
Potassium: 4.9 mmol/L (ref 3.5–5.1)
Sodium: 129 mmol/L — ABNORMAL LOW (ref 135–145)

## 2020-06-11 LAB — GLUCOSE, CAPILLARY
Glucose-Capillary: 127 mg/dL — ABNORMAL HIGH (ref 70–99)
Glucose-Capillary: 178 mg/dL — ABNORMAL HIGH (ref 70–99)
Glucose-Capillary: 184 mg/dL — ABNORMAL HIGH (ref 70–99)
Glucose-Capillary: 29 mg/dL — CL (ref 70–99)
Glucose-Capillary: 103 mg/dL — ABNORMAL HIGH (ref 70–99)

## 2020-06-11 LAB — MAGNESIUM: Magnesium: 2 mg/dL (ref 1.7–2.4)

## 2020-06-11 MED ORDER — TORSEMIDE 40 MG PO TABS: 40.0000 mg | ORAL_TABLET | Freq: Every day | ORAL | 1 refills | Status: DC

## 2020-06-11 MED ORDER — OXBRYTA 500 MG PO TABS: 1500.0000 mg | ORAL_TABLET | Freq: Every day | ORAL | Status: AC

## 2020-06-11 MED ORDER — DICLOFENAC SODIUM 1 % EX GEL: 2.0000 g | Freq: Four times a day (QID) | CUTANEOUS | 1 refills | Status: AC

## 2020-06-11 MED ORDER — CEFAZOLIN SODIUM-DEXTROSE 2-4 GM/100ML-% IV SOLN
2.0000 g | Freq: Three times a day (TID) | INTRAVENOUS | Status: AC
  Administered 2020-06-11: 2 g via INTRAVENOUS
  Filled 2020-06-11: qty 100

## 2020-06-11 MED ORDER — AMIODARONE HCL 200 MG PO TABS: 200.0000 mg | ORAL_TABLET | Freq: Every day | ORAL | 1 refills | Status: DC

## 2020-06-11 MED ORDER — INSULIN GLARGINE 100 UNITS/ML SOLOSTAR PEN: 10.0000 [IU] | PEN_INJECTOR | Freq: Every day | SUBCUTANEOUS | Status: AC

## 2020-06-11 MED ORDER — COLCHICINE 0.6 MG PO CAPS: 1.0000 | ORAL_CAPSULE | ORAL | Status: AC

## 2020-06-11 MED ORDER — DEFERASIROX 180 MG PO TABS: 540.0000 mg | ORAL_TABLET | Freq: Every day | ORAL | Status: AC

## 2020-06-11 NOTE — Progress Notes (Signed)
Physical Therapy Treatment Patient Details Name: Pamela Hudson MRN: [id number] DOB: January 03, 1986 Today's Date: 06/11/2020    History of Present Illness 35 y.o. female presenting to [facility] ED on 05/27/2020 with LE swelling and SOB, concerned for volume overload with CHf exacerbation. Gout flare acutely (L gr toe). 3/20 SVT with recurrence 3/22. 3/23 port removed. PMH includes cardiomegaly, CHF, chronic pain, CVA, HTN, PCOS, sickle cell anemia, DM2, chronic right heart strain, pulmonary HTN, CKD stage 3, HTN, transient A.fib, COVID 2020.    PT Comments    Pt received in supine, agreeable to therapy session and with good participation and tolerance for gait/stair training. Pt making good progress toward functional mobility goals at modI level with no AD this session and more stable, she scored 20/24 on Dynamic Gait Index indicating low fall risk and she reports only mild L great toe pain from gout. Pt with 3/10 modified RPE (fatigue) after gait trial >500 ft and performing 2 flights of steps. Pt continues to benefit from PT services to progress toward functional mobility goals. Anticipate pt safe to DC home with no PT follow-up once medically cleared.  Follow Up Recommendations  No PT follow up     Equipment Recommendations  None recommended by PT    Recommendations for Other Services       Precautions / Restrictions Precautions Precautions: Fall Precaution Comments: left great toe gout pain Restrictions Weight Bearing Restrictions: No    Mobility  Bed Mobility Overal bed mobility: Modified Independent Bed Mobility: Supine to Sit;Sit to Supine     Supine to sit: Modified independent (Device/Increase time) Sit to supine: Modified independent (Device/Increase time)   General bed mobility comments: increased time, able to sit from flat bed    Transfers Overall transfer level: Modified independent     Sit to Stand: Modified independent (Device/Increase time)         General  transfer comment: from EOB, no AD, needs to use hands to rise  Ambulation/Gait Ambulation/Gait assistance: Modified independent (Device/Increase time);Supervision Gait Distance (Feet): 550 Feet Assistive device: None Gait Pattern/deviations: Step-through pattern;Decreased stride length;Decreased stance time - left;Antalgic Gait velocity: reduced Gait velocity interpretation: 1.31 - 2.62 ft/sec, indicative of limited community ambulator General Gait Details: occasional mild lateral excursions but self-corrects without LOB and fairly steady throughout aside from mildly antalgic gait due to L toe pain; offered cane prior to trial but pt deferred and did not need handrail support this session   Stairs Stairs: Yes Stairs assistance: Modified independent (Device/Increase time) Stair Management: One rail Right;Forwards;Step to pattern Number of Stairs: 21 General stair comments: L rail to descend/R rail to ascend, no LOB or buckling, good recall of sequencing from previous session a few days ago; pt will have x30 steps at home so practiced 20 to ensure pt comfortable performing many steps   Wheelchair Mobility    Modified Rankin (Stroke Patients Only)       Balance Overall balance assessment: Needs assistance Sitting-balance support: No upper extremity supported;Feet supported Sitting balance-Leahy Scale: Good     Standing balance support: No upper extremity supported Standing balance-Leahy Scale: Good Standing balance comment: no LOB without use of external supports or rails aside from with steps                 Standardized Balance Assessment Standardized Balance Assessment : Dynamic Gait Index   Dynamic Gait Index Level Surface: Normal Change in Gait Speed: Normal Gait with Horizontal Head Turns: Normal Gait with Vertical Head Turns: Normal  Gait and Pivot Turn: Mild Impairment Step Over Obstacle: Mild Impairment Step Around Obstacles: Normal Steps: Moderate  Impairment Total Score: 20      Cognition Arousal/Alertness: Awake/alert Behavior During Therapy: WFL for tasks assessed/performed Overall Cognitive Status: Within Functional Limits for tasks assessed                                 General Comments: participatory      Exercises Other Exercises Other Exercises: pt given standing HEP handout reviewed with pt/spouse and encouraged SLR/ankle pumps in bed    General Comments General comments (skin integrity, edema, etc.): HR to 113 bpm with exertion, SpO2 WNL and no dizziness, BP WNL per chart review not further assessed      Pertinent Vitals/Pain Pain Assessment: 0-10 Pain Score: 2  Pain Location: L gr toe gout related pain Pain Descriptors / Indicators: Discomfort Pain Intervention(s): Monitored during session    Home Living                      Prior Function            PT Goals (current goals can now be found in the care plan section) Acute Rehab PT Goals Patient Stated Goal: return home PT Goal Formulation: With patient Time For Goal Achievement: 06/17/20 Potential to Achieve Goals: Good Progress towards PT goals: Progressing toward goals    Frequency    Min 3X/week      PT Plan Current plan remains appropriate    Co-evaluation              AM-PAC PT "6 Clicks" Mobility   Outcome Measure  Help needed turning from your back to your side while in a flat bed without using bedrails?: None Help needed moving from lying on your back to sitting on the side of a flat bed without using bedrails?: None Help needed moving to and from a bed to a chair (including a wheelchair)?: None Help needed standing up from a chair using your arms (e.g., wheelchair or bedside chair)?: None Help needed to walk in hospital room?: None Help needed climbing 3-5 steps with a railing? : None 6 Click Score: 24    End of Session Equipment Utilized During Treatment: Gait belt Activity Tolerance: Patient  tolerated treatment well Patient left: in bed;with call bell/phone within reach;with nursing/sitter in room (sitting EOB) Nurse Communication: Mobility status PT Visit Diagnosis: Other abnormalities of gait and mobility (R26.89);Pain Pain - Right/Left: Left Pain - part of body: Ankle and joints of foot     Time: 1001-1026 PT Time Calculation (min) (ACUTE ONLY): 25 min  Charges:  $Gait Training: 23-37 mins                     Deyanna Mctier P., PTA Acute Rehabilitation Services Pager: [phone] Office: [phone]   Angus Palms 06/11/2020, 10:56 AM

## 2020-06-11 NOTE — Discharge Instructions (Signed)
Heart Failure, Diagnosis  Heart failure means that your heart is not able to pump blood in the right way. This makes it hard for your body to work well. Heart failure is usually a long-term (chronic) condition. You must take good care of yourself and follow your treatment plan from your doctor. What are the causes?  High blood pressure.  Buildup of cholesterol and fat in the arteries.  Heart attack. This injures the heart muscle.  Heart valves that do not open and close properly.  Damage of the heart muscle. This is also called cardiomyopathy.  Infection of the heart muscle. This is also called myocarditis.  Lung disease. What increases the risk?  Getting older. The risk of heart failure goes up as a person ages.  Being overweight.  Being female.  Use tobacco or nicotine products.  Abusing alcohol or drugs.  Having taken medicines that can damage the heart.  Having any of these conditions: ? Diabetes. ? Abnormal heart rhythms. ? Thyroid problems. ? Low blood counts (anemia).  Having a family history of heart failure. What are the signs or symptoms?  Shortness of breath.  Coughing.  Swelling of the feet, ankles, legs, or belly.  Losing or gaining weight for no reason.  Trouble breathing.  Waking from sleep because of the need to sit up and get more air.  Fast heartbeat.  Being very tired.  Feeling dizzy, or feeling like you may pass out (faint).  Having no desire to eat.  Feeling like you may vomit (nauseous).  Peeing (urinating) more at night.  Feeling confused. How is this treated? This condition may be treated with:  Medicines. These can be given to treat blood pressure and to make the heart muscles stronger.  Changes in your daily life. These may include: ? Eating a healthy diet. ? Staying at a healthy body weight. ? Quitting tobacco, alcohol, and drug use. ? Doing exercises. ? Participating in a cardiac rehabilitation program. This program  helps you improve your health through exercise, education, and counseling.  Surgery. Surgery can be done to open blocked valves, or to put devices in the heart, such as pacemakers.  A donor heart (heart transplant). You will receive a healthy heart from a donor. Follow these instructions at home:  Treat other conditions as told by your doctor. These may include high blood pressure, diabetes, thyroid disease, or abnormal heart rhythms.  Learn as much as you can about heart failure.  Get support as you need it.  Keep all follow-up visits. Summary  Heart failure means that your heart is not able to pump blood in the right way.  This condition is often caused by high blood pressure, heart attack, or damage of the heart muscle.  Symptoms of this condition include shortness of breath and swelling of the feet, ankles, legs, or belly. You may also feel very tired or feel like you may vomit.  You may be treated with medicines, surgery, or changes in your daily life.  Treat other health conditions as told by your doctor. This information is not intended to replace advice given to you by your health care provider. Make sure you discuss any questions you have with your health care provider. Document Revised: 09/22/2019 Document Reviewed: 09/22/2019 Elsevier Patient Education  2021 [employer]  

## 2020-06-11 NOTE — TOC Transition Note (Signed)
Transition of Care [facility]) - CM/SW Discharge Note   Patient Details  Name: Pamela Hudson MRN: [id number] Date of Birth: 22-Sep-1985  Transition of Care [facility]) CM/SW Contact:  Leone Haven, RN Phone Number: 06/11/2020, 3:53 PM   Clinical Narrative:    Patient is for dc today, she is independent.  She has no needs.   Final next level of care: Home/Self Care Barriers to Discharge: No Barriers Identified   Patient Goals and CMS Choice Patient states their goals for this hospitalization and ongoing recovery are:: return home   Choice offered to / list presented to : NA  Discharge Placement                       Discharge Plan and Services In-house Referral: NA Discharge Planning Services: CM Consult Post Acute Care Choice: NA            DME Agency: NA       HH Arranged: NA          Social Determinants of Health (SDOH) Interventions Food Insecurity Interventions: Intervention Not Indicated Financial Strain Interventions: Intervention Not Indicated Housing Interventions: Intervention Not Indicated Intimate Partner Violence Interventions: Intervention Not Indicated Transportation Interventions: Intervention Not Indicated Alcohol Brief Interventions/Follow-up: AUDIT Score <7 follow-up not indicated   Readmission Risk Interventions Readmission Risk Prevention Plan 06/11/2020 09/04/2019  Transportation Screening Complete Complete  PCP or Specialist Appt within 3-5 Days - Complete  HRI or Home Care Consult - Complete  Social Work Consult for Recovery Care Planning/Counseling - Complete  Palliative Care Screening - Not Applicable  Medication Review [employer]) Complete Complete  PCP or Specialist appointment within 3-5 days of discharge Complete -  HRI or Home Care Consult Complete -  SW Recovery Care/Counseling Consult Complete -  Palliative Care Screening Not Applicable -  Some recent data might be hidden

## 2020-06-11 NOTE — Progress Notes (Signed)
Hypoglycemic Event  CBG: 29  Treatment: Patient given 2 cups of OJ to drink and a nourishment bag  Symptoms: None  Follow-up CBG: Time: 1608 CBG Result: 103  Possible Reasons for Event: Patient did not eat lunch  Comments/MD notified: n/a    Chosen Pamela Hudson

## 2020-06-11 NOTE — TOC Initial Note (Signed)
Transition of Care [facility]) - Initial/Assessment Note    Patient Details  Name: Pamela Hudson MRN: [id number] Date of Birth: Aug 13, 1985  Transition of Care [facility]) CM/SW Contact:    Leone Haven, RN Phone Number: 06/11/2020, 1:36 PM  Clinical Narrative:                 Patient is for dc today, she is independent.  She has no needs.  Expected Discharge Plan: Home/Self Care Barriers to Discharge: No Barriers Identified   Patient Goals and CMS Choice Patient states their goals for this hospitalization and ongoing recovery are:: return home   Choice offered to / list presented to : NA  Expected Discharge Plan and Services Expected Discharge Plan: Home/Self Care In-house Referral: NA Discharge Planning Services: CM Consult Post Acute Care Choice: NA Living arrangements for the past 2 months: Single Family Home Expected Discharge Date: 06/11/20                 DME Agency: NA       HH Arranged: NA          Prior Living Arrangements/Services Living arrangements for the past 2 months: Single Family Home Lives with:: Significant Other Patient language and need for interpreter reviewed:: Yes Do you feel safe going back to the place where you live?: Yes      Need for Family Participation in Patient Care: Yes (Comment) Care giver support system in place?: Yes (comment)   Criminal Activity/Legal Involvement Pertinent to Current Situation/Hospitalization: No - Comment as needed  Activities of Daily Living Home Assistive Devices/Equipment: Eyeglasses ADL Screening (condition at time of admission) Patient's cognitive ability adequate to safely complete daily activities?: Yes Is the patient deaf or have difficulty hearing?: No Does the patient have difficulty seeing, even when wearing glasses/contacts?: No Does the patient have difficulty concentrating, remembering, or making decisions?: No Patient able to express need for assistance with ADLs?: No Does the patient have  difficulty dressing or bathing?: No Independently performs ADLs?: Yes (appropriate for developmental age) Does the patient have difficulty walking or climbing stairs?: No Weakness of Legs: None Weakness of Arms/Hands: None  Permission Sought/Granted                  Emotional Assessment   Attitude/Demeanor/Rapport: Engaged Affect (typically observed): Appropriate Orientation: : Oriented to Self,Oriented to Place,Oriented to  Time,Oriented to Situation Alcohol / Substance Use: Not Applicable Psych Involvement: No (comment)  Admission diagnosis:  SOB (shortness of breath) [R06.02] Acute exacerbation of CHF (congestive heart failure) (HCC) [I50.9] Hypervolemia, unspecified hypervolemia type [E87.70] Acute on chronic congestive heart failure, unspecified heart failure type (HCC) [I50.9] Patient Active Problem List   Diagnosis Date Noted  . SVT (supraventricular tachycardia) (HCC)   . Bacteremia   . DM2 (diabetes mellitus, type 2) (HCC) 05/27/2020  . Diabetes mellitus due to underlying condition with hyperosmolarity without coma, without long-term current use of insulin (HCC)   . Hyperosmolar hyperglycemic state (HHS) (HCC) 08/30/2019  . Chronic right-sided heart failure (HCC)   . Pulmonary hypertension (HCC)   . CKD (chronic kidney disease)   . Other ascites   . Transaminitis 08/31/2018  . Acute on chronic right-sided heart failure (HCC) 08/30/2018  . Acute exacerbation of CHF (congestive heart failure) (HCC) 07/09/2018  . Anemia in other chronic diseases classified elsewhere 07/09/2018  . Hyperkalemia 03/25/2018  . Acute respiratory failure with hypoxia (HCC) 02/17/2018  . Essential hypertension 02/17/2018  . Anemia 02/17/2018  . Dyspnea 02/16/2018  .  Gastroenteritis 05/28/2017  . Dehydration 05/28/2017  . AKI (acute kidney injury) (HCC) 05/28/2017  . Sickle cell anemia with crisis (HCC) 05/28/2017  . Hemorrhagic ovarian cyst 12/26/2016  . Hemorrhagic cyst of ovary  12/26/2016  . Pneumonia 11/26/2015  . Sickle cell anemia (HCC) 11/26/2015  . Elevated bilirubin 11/26/2015  . Serum calcium elevated 11/26/2015  . Hyponatremia 11/26/2015  . Leukocytosis 11/26/2015  . CVA (cerebral infarction) 02/13/2012   PCP:  Judd Lien, PA-C Pharmacy:   [id number] - [place], [place] - [address] [place] [address] Phone: [phone] Fax: [phone]  [id number] - [place], [place] - [address] [place] [address] Phone: [phone] Fax: [phone]     Social Determinants of Health (SDOH) Interventions Food Insecurity Interventions: Intervention Not Indicated Financial Strain Interventions: Intervention Not Indicated Housing Interventions: Intervention Not Indicated Intimate Partner Violence Interventions: Intervention Not Indicated Transportation Interventions: Intervention Not Indicated Alcohol Brief Interventions/Follow-up: AUDIT Score <7 follow-up not indicated  Readmission Risk Interventions Readmission Risk Prevention Plan 06/11/2020 09/04/2019  Transportation Screening Complete Complete  PCP or Specialist Appt within 3-5 Days - Complete  HRI or Home Care Consult - Complete  Social Work Consult for Recovery Care Planning/Counseling - Complete  Palliative Care Screening - Not Applicable  Medication Review Oceanographer) Complete Complete  PCP or Specialist appointment within 3-5 days of discharge Complete -  HRI or Home Care Consult Complete -  SW Recovery Care/Counseling Consult Complete -  Palliative Care Screening Not Applicable -  Some recent data might be hidden

## 2020-06-11 NOTE — Discharge Summary (Signed)
Physician Discharge Summary  [name] TMH:[id number] DOB: September 01, 1985 DOA: 05/27/2020  PCP: Curt Jews, PA-C  Admit date: 05/27/2020 Discharge date: 06/11/2020  Time spent: 45 minutes  Recommendations for Outpatient Follow-up:  Patient will be discharged to home.  Patient will need to follow up with primary care provider within one week of discharge repeat CBC and CMP.  Follow up with cardiology. Patient should continue medications as prescribed.  Patient should follow a heart healthy/carb modified diet.    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 Toy Baker Pamela Hudson is a 35 y.o. female with medical history significant of cardiomegaly, CHF, chronic pain, CVA, HTN, PCOS, sickle cell anemia, DM2, chronic right heart strain, pulmonary Hypertension, CKD stage 3, HTN, transient A.fib COVID 2020   Presented with   Gradual leg swelling patient with CHF, chronic pain.  Patient was concerned that she was volume overloaded.  She has been having also worsening shortness of breath for the past few days to weeks.  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 -Echocardiogram showed an EF of 45 to 50%, severely reduced RVSF and RVSP of 34 -TEE on 06/03/2020 showed an EF of 30 to 35%,  moderate and severe RV dysfunction -Right heart cath with PCWP of 16, preserved CO and severe RVF -Cardiology was following and appreciated, patient was initially placed on IV Lasix and milrinone, however transition to oral Demadex 40 mg daily -Not on GDMT due to decompensated heart failure -Patient to follow-up with cardiology as an outpatient -Per cardiology, patient is end-stage and options are limited  SVT -Resolved, was in the setting of milrinone -Patient was transitioned to oral amiodarone 200 mg daily  Staph capitis bacteremia -Thought to be from port-a-cath which was removed on 3/23 -TEE on 3/22 showed no evidence of endocarditis  -Repeat blood cultures from 3/17 and 3/24 negative  -ID recommended Ancef through 06/11/2020  Acute kidney injury on chronic kidney disease, stage IIIB -Creatinine approximate 1.4-1.6. 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  Patient will need to follow-up closely with her PCP.  Sickle cell anemia/chronic pain -Continue hydroxyurea -Continue pain control with oral Dilaudid, Voltaren gel  Leukocytosis/bandemia -Likely secondary to demargination from steroids -Improved  Hyponatremia -Appears to be chronic and likely due to diuresis, patient currently asymptomatic -Repeat BMP in one week  Procedures: Echocardiogram TEE Heart cath Abd [place] Removal of implanted tunneled port-a-cath  Consultations: Cardiology/CHF Interventional radiology Infectious disease PCCM  Discharge Exam: Vitals:    06/11/20  0420 06/11/20 0806  BP: 104/73 100/60  Pulse: (!) 101 99  Resp: 17 20  Temp: 99.9 F (37.7 C) 99.2 F (37.3 C)  SpO2: 91% 95%     General: Well developed, well nourished, NAD, appears stated age  HEENT: NCAT, mucous membranes moist.  Cardiovascular: S1 S2 auscultated, RRR, 2/6 SEM  Respiratory: Clear to auscultation bilaterally with equal chest rise  Abdomen: Soft, nontender, nondistended, + bowel sounds  Extremities: warm dry without cyanosis clubbing or edema  Neuro: AAOx3,nonfocal  Psych: appropriate mood and affect, pleasant  Discharge Instructions Discharge Instructions    Care order/instruction   Complete by: As directed    Transfuse Parameters   Diet - low sodium heart healthy   Complete by: As directed    Discharge instructions   Complete by: As directed    Patient will be discharged to home.  Patient will need to follow up with primary care provider within one week of discharge repeat CBC and CMP.  Follow up with cardiology. Patient should continue medications as prescribed.  Patient should follow a heart healthy/carb modified diet.   Increase activity slowly   Complete by: As directed    No wound care   Complete by: As directed    Type and screen   Complete by: As directed    [place]      Allergies as of 06/11/2020      Reactions   Oxycodone-acetaminophen Swelling, Other (See Comments)   Lips swell   Prednisone Other (See Comments)   Elevates BGL      Medication List    STOP taking these medications   digoxin 0.125 MG tablet Commonly known as: LANOXIN   metoprolol succinate 25 MG 24 hr tablet Commonly known as: TOPROL-XL     TAKE these medications   amiodarone 200 MG tablet Commonly known as: PACERONE Take 1 tablet (200 mg total) by mouth daily. Start taking on: June 12, 2020   blood glucose meter kit and supplies Dispense based on patient and insurance preference. Use up to four times daily as directed.  (FOR ICD-10 E10.9, E11.9).   Colchicine 0.6 MG Caps Take 1 capsule by mouth every other day. Hold until seen by your primary care physician and you have repeat labs. What changed: additional instructions   Deferasirox 180 MG Tabs Take 540 mg by mouth at bedtime. What changed:   medication strength  how much to take   diclofenac Sodium 1 % Gel Commonly known as: VOLTAREN Apply 2 g topically 4 (four) times daily.   empagliflozin 25 MG Tabs tablet Commonly known as: JARDIANCE Take 25 mg by mouth in the morning and at bedtime.   folic acid 1 MG tablet Commonly known as: FOLVITE Take 1 mg by mouth daily.   HYDROmorphone 2 MG tablet Commonly known as: DILAUDID Take 2 mg by mouth every 4 (four) hours as needed for severe pain or moderate pain.   hydroxyurea 500 MG capsule Commonly known as: HYDREA Take 1,000 mg by mouth daily.   IMPLANON [place] Inject 1 each into the skin once. Implanted spring 2019   insulin glargine 100 unit/mL Sopn Commonly known as: LANTUS Inject 10 Units into the skin daily. What changed: how much to take   Insulin Pen Needle 32G X 4 MM Misc 5 Units/oz/day by Does not apply route in the morning, at noon, and at bedtime.   MAGnesium-Oxide 400 (241.3 Mg) MG tablet Generic drug: magnesium oxide Take 1 tablet by mouth  daily.   metFORMIN 500 MG tablet Commonly known as: GLUCOPHAGE Take 1,000 mg by mouth 2 (two) times daily with a meal.   NovoLOG FlexPen 100 UNIT/ML FlexPen Generic drug: insulin aspart Inject 5 Units into the skin 3 (three) times daily with meals. What changed: additional instructions   Oxbryta 500 MG Tabs tablet Generic drug: voxelotor Take 1,500 mg by mouth daily. What changed: how much to take   Torsemide 40 MG Tabs Take 40 mg by mouth daily. Start taking on: June 12, 2020 What changed:   medication strength  how much to take   Vitamin D (Ergocalciferol) 1.25 MG (50000 UNIT) Caps capsule Commonly known as:  DRISDOL Take 50,000 Units by mouth every 30 (thirty) days.   Womens Multi Gummies Chew Chew 1 tablet by mouth daily.      Allergies  Allergen Reactions  . Oxycodone-Acetaminophen Swelling and Other (See Comments)    Lips swell  . Prednisone Other (See Comments)    Elevates BGL    Follow-up Information    [facility] Follow up on 06/25/2020.   Specialty: Cardiology Why: at 1100 in the HF clinic  Contact information: [address] [id number] [place]       Curt Jews, [place]. Schedule an appointment as soon as possible for a visit in 1 week(s).   Specialty: Physician Assistant Why: Hospital follow up Contact information: [place] [address] [phone]                The results of significant diagnostics from this hospitalization (including imaging, microbiology, ancillary and laboratory) are listed below for reference.    Significant Diagnostic Studies: US Abdomen Complete  Result Date: 05/28/2020 CLINICAL DATA:  Elevated liver function tests, chronic renal insufficiency EXAM: ABDOMEN ULTRASOUND COMPLETE COMPARISON:  04/17/2020 FINDINGS: Gallbladder: Surgically absent Common bile duct: Diameter: 4 mm Liver: Stable heterogeneous increased liver echotexture most consistent with hepatic steatosis. No focal liver abnormality. No intrahepatic duct dilation. Portal vein is patent on color Doppler imaging with normal direction of blood flow towards the liver. IVC: No abnormality visualized. Pancreas: Visualized portion unremarkable. Spleen: Suboptimally visualized due to splenic atrophy noted on previous CT exams. Right Kidney: Length: 11.7 cm. Echogenicity within normal limits. No mass or hydronephrosis visualized. Left Kidney: Length: 10.3 cm. Echogenicity within normal limits. No mass or hydronephrosis visualized. Abdominal aorta: No aneurysm  visualized. Other findings: None. IMPRESSION: 1. Increased liver echotexture compatible with hepatic steatosis. 2. Status post cholecystectomy. 3. 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 Electronically Signed   By: Randa Ngo M.D.   On: 05/28/2020 02:18   CARDIAC CATHETERIZATION  Result Date: 05/29/2020 Findings: RA = 23 RV = 34/20 PA = 35/14 (26) PCW = 16 Fick cardiac output/index = 9.1/5.8 Thermo CO/CI = 6.9/4.3 PVR = 1.1 (Fick) 1.5 (Thermo) Ao sat = 96% PA sat = 66%, 68% High SVC sat = 73% PAPi = 0.9 Assessment: 1. Very mild PAH 2. Severe RV failure however cardiac output preserved 3. No evidence of intracardiac shunt Plan/Discussion: Suspect she has near end-stage RV failure due to longstanding PAH. Continue medical therapy. Given normal cardiac output, not sure there is any role for inotropic support at this time. Glori Bickers, MD 7:49 PM   IR REMOVAL TUN ACCESS W/ PORT W/O FL MOD SED  Result Date: 06/04/2020 CLINICAL DATA:  Bacteremia and need to remove  indwelling Port-A-Cath due to potential seeding. EXAM: REMOVAL OF IMPLANTED TUNNELED PORT-A-CATH MEDICATIONS: 2 mg IV Versed PROCEDURE: The right chest Port-A-Cath site was prepped with chlorhexidine. A sterile gown and gloves were worn during the procedure. Local anesthesia was provided with 1% lidocaine. An incision was made overlying the Port-A-Cath with a #15 scalpel. Utilizing sharp and blunt dissection, the Port-A-Cath was removed. Portable cautery was utilized. Retention sutures were removed. The pocked was irrigated with sterile saline. Wound closure was performed with subcutaneous 3-0 Monocryl, subcuticular 4-0 Vicryl and Dermabond. The entire Port-A-Cath was removed successfully. The port pocket is clean on inspection and did not appear infected. IMPRESSION: Removal of implanted Port-A-Cath utilizing sharp and blunt dissection. The procedure was uncomplicated. Electronically  Signed   By: Aletta Edouard M.D.   On: 06/04/2020 15:56   DG Chest Portable 1 View  Result Date: 05/27/2020 CLINICAL DATA:  Weakness and shortness of breath. EXAM: PORTABLE CHEST 1 VIEW COMPARISON:  08/30/2019 FINDINGS: Right chest wall port a catheter is noted with tip in the SVC. Cardiac enlargement, unchanged. New right pleural effusion with overlying subsegmental atelectasis. Pulmonary vascular congestion. The visualized osseous structures are unremarkable. IMPRESSION: 1. Cardiac enlargement, pulmonary vascular congestion, and new right pleural effusion with overlying subsegmental atelectasis. Findings concerning for CHF. Electronically Signed   By: Kerby Moors M.D.   On: 05/27/2020 17:04   DG Foot 2 Views Left  Result Date: 06/02/2020 CLINICAL DATA:  Pain EXAM: LEFT FOOT - 2 VIEW COMPARISON:  August 20, 2019 FINDINGS: Frontal and lateral views were obtained. No fracture or dislocation. Joint spaces appear normal. No erosive change. There is a small inferior calcaneal spur. IMPRESSION: No fracture or dislocation. No evident arthropathy. There is a small inferior calcaneal spur. Electronically Signed   By: Lowella Grip III M.D.   On: 06/02/2020 12:12   ECHOCARDIOGRAM COMPLETE  Result Date: 05/28/2020    ECHOCARDIOGRAM REPORT   Patient Name:   Pamela Hudson Date of Exam: 05/28/2020 Medical Rec #:  [id number]     Height:       59.0 in Accession #:    [id number]    Weight:       138.7 lb Date of Birth:  1985-10-19     BSA:          1.579 m Patient Age:    34 years      BP:           101/64 mmHg Patient Gender: F             HR:           108 bpm. Exam Location:  Inpatient Procedure: 2D Echo, 3D Echo, Cardiac Doppler and Color Doppler Indications:    I51.7 Cardiomegaly  History:        Patient has prior history of Echocardiogram examinations, most                 recent 08/31/2018. Abnormal ECG, Stroke and Pulmonary HTN; Risk                 Factors:Hypertension and Diabetes. Sickle cell.  Sonographer:     Roseanna Rainbow RDCS Referring Phys: [place]  1. 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 The leaflets are fixed and appear immobile. Leaflet immobility is out of proportion to annular dilation. Would consider primary leaflet pathology such as carcinoid involvement in the work-up in addition to the patient's long history of severe pulmonary hypertension. The tricuspid valve is abnormal.  Tricuspid valve regurgitation is severe.  2. RVSP is severely underestimated in the setting of severe tricuspid regurgitation. Right ventricular systolic function is severely reduced. The right ventricular size is severely enlarged. There is normal pulmonary artery systolic pressure. The estimated right ventricular systolic pressure is 24.2 mmHg.  3. Left ventricular ejection fraction, by estimation, is 45 to 50%. Left ventricular ejection fraction by 3D volume is 45 %. The left ventricle has mildly decreased function. The left ventricle demonstrates global hypokinesis. Indeterminate diastolic filling due to E-A fusion. There is the interventricular septum is flattened in systole and diastole, consistent with right ventricular pressure and volume overload.  4. Right atrial size was severely dilated.  5. A small pericardial effusion is present. The pericardial effusion is circumferential.  6. The mitral valve is grossly normal. Mild to moderate mitral valve regurgitation. No evidence of mitral stenosis.  7. The aortic valve is tricuspid. Aortic valve regurgitation is not visualized. No aortic stenosis is present.  8. The inferior vena cava is dilated in size with <50% respiratory variability, suggesting right atrial pressure of 15 mmHg. Comparison(s): Changes from prior study are noted. RV is severely dilated with severely reduced function. Severe torrential TR is present. LVEF is mildly reduced 45-50%. RVSP underestimated in setting of severe TR. FINDINGS  Left Ventricle: Left ventricular ejection  fraction, by estimation, is 45 to 50%. Left ventricular ejection fraction by 3D volume is 45 %. The left ventricle has mildly decreased function. The left ventricle demonstrates global hypokinesis. The left ventricular internal cavity size was normal in size. There is no left ventricular hypertrophy. The interventricular septum is flattened in systole and diastole, consistent with right ventricular pressure and volume overload. Indeterminate diastolic filling due to E-A fusion. Right Ventricle: RVSP is severely underestimated in the setting of severe tricuspid regurgitation. The right ventricular size is severely enlarged. No increase in right ventricular wall thickness. Right ventricular systolic function is severely reduced. There is normal pulmonary artery systolic pressure. The tricuspid regurgitant velocity is 2.21 m/s, and with an assumed right atrial pressure of 15 mmHg, the estimated right ventricular systolic pressure is 35.3 mmHg. Left Atrium: Left atrial size was normal in size. Right Atrium: Right atrial size was severely dilated. Pericardium: A small pericardial effusion is present. The pericardial effusion is circumferential. Mitral Valve: The mitral valve is grossly normal. Mild to moderate mitral valve regurgitation. No evidence of mitral valve stenosis. MV peak gradient, 14.0 mmHg. The mean mitral valve gradient is 4.0 mmHg. Tricuspid Valve: There is severe, torrential tricuspid regurgitation. The leaflets are fixed and appear immobile. Leaflet immobility is out of proportion to annular dilation. Would consider primary leaflet pathology such as carcinoid involvement in the work-up in addition to the patient's long history of severe pulmonary hypertension. The tricuspid valve is abnormal. Tricuspid valve regurgitation is severe. No evidence of tricuspid stenosis. The flow in the hepatic veins is reversed during ventricular systole. Aortic Valve: The aortic valve is tricuspid. Aortic valve  regurgitation is not visualized. No aortic stenosis is present. Pulmonic Valve: The pulmonic valve was grossly normal. Pulmonic valve regurgitation is mild. No evidence of pulmonic stenosis. Aorta: The aortic root and ascending aorta are structurally normal, with no evidence of dilitation. Venous: The inferior vena cava is dilated in size with less than 50% respiratory variability, suggesting right atrial pressure of 15 mmHg. IAS/Shunts: There is left bowing of the interatrial septum, suggestive of elevated right atrial pressure. The atrial septum is grossly normal.  LEFT VENTRICLE PLAX 2D  LVIDd:         4.80 cm         Diastology LVIDs:         3.60 cm         LV e' medial:    9.25 cm/s LV PW:         1.15 cm         LV E/e' medial:  14.9 LV IVS:        0.90 cm         LV e' lateral:   11.40 cm/s LVOT diam:     1.80 cm         LV E/e' lateral: 12.1 LV SV:         56 LV SV Index:   36 LVOT Area:     2.54 cm        3D Volume EF                                LV 3D EF:    Left                                             ventricular LV Volumes (MOD)                            ejection LV vol d, MOD    77.2 ml                    fraction by A2C:                                        3D volume LV vol d, MOD    98.4 ml                    is 45 %. A4C: LV vol s, MOD    36.1 ml A2C:                           3D Volume EF: LV vol s, MOD    59.1 ml       3D EF:        45 % A4C: LV SV MOD A2C:   41.1 ml LV SV MOD A4C:   98.4 ml LV SV MOD BP:    43.2 ml RIGHT VENTRICLE             IVC RV S prime:     10.90 cm/s  IVC diam: 3.00 cm TAPSE (M-mode): 1.9 cm LEFT ATRIUM           Index       RIGHT ATRIUM           Index LA diam:      4.00 cm 2.53 cm/m  RA Area:     25.80 cm LA Vol (A2C): 56.3 ml 35.66 ml/m RA Volume:   102.00 ml 64.61 ml/m LA Vol (A4C): 39.5 ml 25.02 ml/m  AORTIC VALVE              PULMONIC VALVE LVOT Vmax:   134.00 cm/s  PR End Diast Vel: 2.37 msec  LVOT Vmean:  104.000 cm/s LVOT VTI:    0.222 m  AORTA Ao Root  diam: 2.70 cm Ao Asc diam:  3.30 cm MITRAL VALVE                 TRICUSPID VALVE MV Area (PHT): 5.84 cm      TR Peak grad:   19.5 mmHg MV Area VTI:   1.70 cm      TR Vmax:        221.00 cm/s MV Peak grad:  14.0 mmHg MV Mean grad:  4.0 mmHg      SHUNTS MV Vmax:       1.87 m/s      Systemic VTI:  0.22 m MV Vmean:      81.7 cm/s     Systemic Diam: 1.80 cm MV Decel Time: 130 msec MR Peak grad:    83.7 mmHg MR Mean grad:    56.0 mmHg MR Vmax:         457.50 cm/s MR Vmean:        356.5 cm/s MR PISA:         1.01 cm MR PISA Eff ROA: 9 mm MR PISA Radius:  0.40 cm MV E velocity: 138.00 cm/s Eleonore Chiquito MD Electronically signed by Eleonore Chiquito MD Signature Date/Time: 05/28/2020/2:05:27 PM    Final    DG FEMUR, MIN 2 VIEWS RIGHT  Result Date: 06/08/2020 CLINICAL DATA:  RIGHT upper leg pain for 2 days, no known injury EXAM: RIGHT FEMUR 2 VIEWS COMPARISON:  None FINDINGS: Osseous demineralization. Joint spaces preserved. No acute fracture, dislocation, or bone destruction. Few scattered pelvic phleboliths. IMPRESSION: Osseous demineralization without acute abnormalities. Electronically Signed   By: Lavonia Dana M.D.   On: 06/08/2020 10:59    Microbiology: Recent Results (from the past 240 hour(s))  MRSA PCR Screening     Status: None   Collection Time: 06/01/20  8:40 PM   Specimen: Nasopharyngeal  Result Value Ref Range Status   MRSA by PCR NEGATIVE NEGATIVE Final    Comment:        The GeneXpert MRSA Assay (FDA approved for NASAL specimens only), is one component of a comprehensive MRSA colonization surveillance program. It is not intended to diagnose MRSA infection nor to guide or monitor treatment for MRSA infections. Performed at [facility], [place] [address], [place], [place] [address]   Culture, blood (Routine X 2) w Reflex to ID Panel     Status: None   Collection Time: 06/05/20 10:08 AM   Specimen: Left Antecubital; Blood  Result Value Ref Range Status   Specimen Description LEFT  ANTECUBITAL  Final   Special Requests   Final    BOTTLES DRAWN AEROBIC AND ANAEROBIC Blood Culture adequate volume   Culture   Final    NO GROWTH 5 DAYS Performed at [facility], [place] [address] [place], [place] [address]    Report Status 06/10/2020 FINAL  Final  Culture, blood (Routine X 2) w Reflex to ID Panel     Status: None   Collection Time: 06/05/20 10:24 AM   Specimen: BLOOD RIGHT HAND  Result Value Ref Range Status   Specimen Description BLOOD RIGHT HAND  Final   Special Requests   Final    BOTTLES DRAWN AEROBIC AND ANAEROBIC Blood Culture adequate volume   Culture   Final    NO GROWTH 5 DAYS Performed at [facility], [place] [address] [place], [place] [address]  Report Status 06/10/2020 FINAL  Final     Labs: Basic Metabolic Panel: Recent Labs  Lab 06/07/20 0051 06/08/20 0315 06/09/20 0040 06/10/20 0041 06/11/20 0102  NA 133* 132* 132* 133* 129*  K 4.7 5.0 4.9 4.5 4.9  CL 104 99 96* 94* 90*  CO2 21* '25 29 31 30  ' GLUCOSE 84 92 116* 107* 161*  BUN 76* 68* 61* 59* 62*  CREATININE 1.71* 1.48* 1.64* 1.60* 1.83*  CALCIUM 8.9 9.1 9.1 8.8* 8.4*  MG 2.1 2.0 1.9 2.0 2.0   Liver Function Tests: Recent Labs  Lab 06/05/20 0543 06/09/20 0040  AST 61* 67*  ALT 15 13  ALKPHOS 134* 117  BILITOT 3.5* 4.1*  PROT 9.3* 9.1*  ALBUMIN 2.6* 2.6*   No results for input(s): LIPASE, AMYLASE in the last 168 hours. No results for input(s): AMMONIA in the last 168 hours. CBC: Recent Labs  Lab 06/05/20 0543 06/06/20 1033 06/07/20 0051 06/08/20 0315 06/09/20 0040 06/10/20 0041 06/11/20 0102  WBC 15.7*   < > 16.8* 14.5* 13.6* 19.8* 16.7*  NEUTROABS 12.4*  --   --   --   --   --   --   HGB 8.5*   < > 8.5* 8.8* 9.0* 8.8* 8.4*  HCT 22.5*   < > 23.1* 23.8* 24.3* 24.2* 23.8*  MCV 114.2*   < > 114.4* 116.7* 116.3* 117.5* 120.2*  PLT 270   < > 271 290 303 308 293   < > = values in this interval not displayed.   Cardiac Enzymes: Recent Labs  Lab  06/07/20 1206  CKTOTAL 25*   BNP: BNP (last 3 results) Recent Labs    05/27/20 1733 05/29/20 0615  BNP 1,228.5* 3,195.8*    ProBNP (last 3 results) No results for input(s): PROBNP in the last 8760 hours.  CBG: Recent Labs  Lab 06/10/20 1642 06/10/20 1943 06/10/20 2335 06/11/20 0418 06/11/20 0810  GLUCAP 131* 191* 97 127* 178*       Signed:  Rivers Gassmann  [facility] 06/11/2020, 10:19 AM

## 2020-06-25 ENCOUNTER — Other Ambulatory Visit: Payer: Self-pay

## 2020-06-25 ENCOUNTER — Ambulatory Visit (HOSPITAL_COMMUNITY)
Admit: 2020-06-25 | Discharge: 2020-06-25 | Disposition: A | Discharge status: Home / Self Care | Payer: Medicaid Other | Source: Ambulatory Visit | Attending: Adult Health | Admitting: Adult Health

## 2020-06-25 VITALS — BP 94/60 | Wt 115.0 lb

## 2020-06-25 DIAGNOSIS — I440 Atrioventricular block, first degree: Secondary | ICD-10-CM | POA: Diagnosis not present

## 2020-06-25 DIAGNOSIS — D571 Sickle-cell disease without crisis: Secondary | ICD-10-CM | POA: Insufficient documentation

## 2020-06-25 DIAGNOSIS — M109 Gout, unspecified: Secondary | ICD-10-CM | POA: Diagnosis not present

## 2020-06-25 DIAGNOSIS — Z7984 Long term (current) use of oral hypoglycemic drugs: Secondary | ICD-10-CM | POA: Diagnosis not present

## 2020-06-25 DIAGNOSIS — I471 Supraventricular tachycardia, unspecified: Secondary | ICD-10-CM

## 2020-06-25 DIAGNOSIS — Z794 Long term (current) use of insulin: Secondary | ICD-10-CM | POA: Insufficient documentation

## 2020-06-25 DIAGNOSIS — Z8616 Personal history of COVID-19: Secondary | ICD-10-CM | POA: Diagnosis not present

## 2020-06-25 DIAGNOSIS — Z87891 Personal history of nicotine dependence: Secondary | ICD-10-CM | POA: Insufficient documentation

## 2020-06-25 DIAGNOSIS — R0609 Other forms of dyspnea: Secondary | ICD-10-CM | POA: Insufficient documentation

## 2020-06-25 DIAGNOSIS — N183 Chronic kidney disease, stage 3 unspecified: Secondary | ICD-10-CM | POA: Diagnosis not present

## 2020-06-25 DIAGNOSIS — N1831 Chronic kidney disease, stage 3a: Secondary | ICD-10-CM

## 2020-06-25 DIAGNOSIS — I130 Hypertensive heart and chronic kidney disease with heart failure and stage 1 through stage 4 chronic kidney disease, or unspecified chronic kidney disease: Secondary | ICD-10-CM | POA: Diagnosis not present

## 2020-06-25 DIAGNOSIS — Z8249 Family history of ischemic heart disease and other diseases of the circulatory system: Secondary | ICD-10-CM | POA: Insufficient documentation

## 2020-06-25 DIAGNOSIS — Z791 Long term (current) use of non-steroidal anti-inflammatories (NSAID): Secondary | ICD-10-CM | POA: Insufficient documentation

## 2020-06-25 DIAGNOSIS — E1122 Type 2 diabetes mellitus with diabetic chronic kidney disease: Secondary | ICD-10-CM | POA: Diagnosis not present

## 2020-06-25 DIAGNOSIS — I272 Pulmonary hypertension, unspecified: Secondary | ICD-10-CM

## 2020-06-25 DIAGNOSIS — I5032 Chronic diastolic (congestive) heart failure: Secondary | ICD-10-CM | POA: Insufficient documentation

## 2020-06-25 DIAGNOSIS — I5042 Chronic combined systolic (congestive) and diastolic (congestive) heart failure: Secondary | ICD-10-CM | POA: Diagnosis not present

## 2020-06-25 DIAGNOSIS — Z9989 Dependence on other enabling machines and devices: Secondary | ICD-10-CM

## 2020-06-25 DIAGNOSIS — Z79899 Other long term (current) drug therapy: Secondary | ICD-10-CM | POA: Insufficient documentation

## 2020-06-25 DIAGNOSIS — G4733 Obstructive sleep apnea (adult) (pediatric): Secondary | ICD-10-CM

## 2020-06-25 DIAGNOSIS — J984 Other disorders of lung: Secondary | ICD-10-CM | POA: Diagnosis not present

## 2020-06-25 MED ORDER — AMIODARONE HCL 200 MG PO TABS: 100.0000 mg | ORAL_TABLET | Freq: Every day | ORAL | 3 refills | Status: AC

## 2020-06-25 MED ORDER — AMIODARONE HCL 200 MG PO TABS: 200.0000 mg | ORAL_TABLET | Freq: Every day | ORAL | 3 refills | Status: DC

## 2020-06-25 MED ORDER — TORSEMIDE 40 MG PO TABS: 20.0000 mg | ORAL_TABLET | Freq: Every day | ORAL | 3 refills | Status: AC

## 2020-06-25 NOTE — Progress Notes (Signed)
PCP: Micheline Maze  Primary HF Cardiologist: Dr Haroldine Laws  Pulmonary: Dr Marijo File [facility]   HPI: Pamela Hudson a 35 y.o.femalewith morbid obesity, sickle cell disease with related hemochromatosis, severe obstructive sleep apnea by sleep study on 08/14/2018, hypertension and diastolic/RV failure admitted on 6/18 with worsening dyspnea and volume overload.  She was recently admitted in April 2020 with a similar presentation with a weight gain from 173 to 223 over 2 months. She was diuresed and discharge weight was 210 pounds. She subsequently followed up with Pulmonary as well as with Dr. Marijo File at [facility] in [place].   She was volume overloaded at that visit with weight at 220. Lasix switched to torsemide  Echo 07/07/18 Showed LVEF 65-70% with mild LVH. Septum flat. Severe TR with moderate to severe PAH . Hgb 6.4 at the time RHC discussed but not felt necessary.   Also saw Pulmory and was found to have restrictive lung disease with ratio 88% FEV1 60% FVC 58% DLCO 60%. As well as severe OSA with AHI 61/ CT chest was unremarkable except for enlarged pulmonary arteries   Evaluated at [facility] on 6/18 with worsening volume overload and later admitted to [facility] for cardiology evaluation.  Denied missing medicines but does not appear complaint with dietary restrictions. Weight on admission was 245 pounds. Creatinine up 1.12 (07/12/18) - > 1.58 08/30/18.  She did not respond well to IV diuretics and creatinine rose to 1.75 so Cardiology was consulted. She was seen by Dr. Einar Gip and taken for [place]. HF Team consulted and she was diuresed with lasix drip + diamox > 70 pounds. She was transitioned to torsemide 40 mg twice daily.   Admitted 05/2020 with A//C diastolic heart failure. Hospital course complicated by AKI , bacteremia, anemia, SVT and gout. Placed on milrinone for RV failure and to augment diuresis. Blood cultures with staph capitus. TEE was negative for endocarditis. Completed IV antibiotics prior to  d/c.   Today she returns for post heart failure follow up. Saw PCP in follow up and had BMET, creatinine 2.1  Today she returns for HF follow up.Overall feeling fine. No gout issues. Mild dyspnea with exertion. Denies SOB/PND/Orthopnea. Appetite ok. No fever or chills. Weight at home 113 pounds. Taking all medications.    Cardia Testing  TEE 05/2020  EF 30-35% and moderate to severe RV dysfunction  ECHO 08/31/2018 EF 60-65% RV normal , RA/LA mildly dilated, mod -severe TR  RHC 05/30/19  Mild PAH RA 23 PA 35/14 (26) PCWP 16 CO 6.9 CI 5.8   RHC on 09/01/18 RA 38 (with large v-waves) RV 50/31 PA 55/26 (39) PCWP 24 Ao 93% PA sat 66% RA sat 68% Fick 12.9/6.4 PVR = 1.16 WU PAPi = 0.76       ROS: All systems negative except as listed in HPI, PMH and Problem List.  SH:  Social History   Socioeconomic History  . Marital status: Significant Other    Spouse name: Jamar  . Number of children: 1  . Years of education: 63  . Highest education level: Associate degree: occupational, Hotel manager, or vocational program  Occupational History  . Not on file  Tobacco Use  . Smoking status: Former Smoker    Types: Cigarettes  . Smokeless tobacco: Never Used  Vaping Use  . Vaping Use: Never used  Substance and Sexual Activity  . Alcohol use: Not Currently  . Drug use: No  . Sexual activity: Yes    Birth control/protection: Implant  Other Topics Concern  .  Not on file  Social History Narrative  . Not on file   Social Determinants of Health   Financial Resource Strain: Low Risk   . Difficulty of Paying Living Expenses: Not very hard  Food Insecurity: No Food Insecurity  . Worried About Charity fundraiser in the Last Year: Never true  . Ran Out of Food in the Last Year: Never true  Transportation Needs: No Transportation Needs  . Lack of Transportation (Medical): No  . Lack of Transportation (Non-Medical): No  Physical Activity: Insufficiently Active  . Days of Exercise per  Week: 2 days  . Minutes of Exercise per Session: 30 min  Stress: Not on file  Social Connections: Unknown  . Frequency of Communication with Friends and Family: Not on file  . Frequency of Social Gatherings with Friends and Family: Twice a week  . Attends Religious Services: 1 to 4 times per year  . Active Member of Clubs or Organizations: No  . Attends Archivist Meetings: Never  . Marital Status: Living with partner  Intimate Partner Violence: Not At Risk  . Fear of Current or Ex-Partner: No  . Emotionally Abused: No  . Physically Abused: No  . Sexually Abused: No    FH:  Family History  Problem Relation Age of Onset  . Diabetes Mellitus II Father   . CAD Father   . Diabetes Mellitus II Paternal Grandmother     Past Medical History:  Diagnosis Date  . Atrial fibrillation ([place])   . Cardiomegaly   . CHF (congestive heart failure) ([place])   . Chronic kidney disease (CKD), stage III (moderate) (HCC)   . Chronic pain   . COVID-19   . CVA (cerebral infarction) 02/2002  . Diabetes mellitus without complication ([place])   . Hypertension   . Liver disease    ?iron  . PCOS (polycystic ovarian syndrome)   . Renal stone   . Sickle cell anemia (HCC)   . Sleep apnea     Current Outpatient Medications  Medication Sig Dispense Refill  . amiodarone (PACERONE) 200 MG tablet Take 1 tablet (200 mg total) by mouth daily. 30 tablet 1  . blood glucose meter kit and supplies Dispense based on patient and insurance preference. Use up to four times daily as directed. (FOR ICD-10 E10.9, E11.9). 1 each 0  . Deferasirox 180 MG TABS Take 540 mg by mouth at bedtime.    . diclofenac Sodium (VOLTAREN) 1 % GEL Apply 2 g topically 4 (four) times daily. 350 g 1  . empagliflozin (JARDIANCE) 25 MG TABS tablet Take 25 mg by mouth in the morning and at bedtime.    . Etonogestrel (IMPLANON [place]) Inject 1 each into the skin once. Implanted spring [id number]    . folic acid (FOLVITE) 1 MG tablet Take 1 mg by  mouth daily.    . hydroxyurea (HYDREA) 500 MG capsule Take 1,000 mg by mouth daily.     . insulin aspart (NOVOLOG FLEXPEN) 100 UNIT/ML FlexPen Inject 5 Units into the skin 3 (three) times daily with meals. (Patient taking differently: Inject 5 Units into the skin 3 (three) times daily with meals. Sliding scale) 15 mL 11  . insulin glargine (LANTUS) 100 unit/mL SOPN Inject 10 Units into the skin daily.    . Insulin Pen Needle 32G X 4 MM MISC 5 Units/oz/day by Does not apply route in the morning, at noon, and at bedtime. 200 each 6  . MAGNESIUM-OXIDE 400 (241.3 Mg) MG tablet  Take 1 tablet by mouth daily.    . metFORMIN (GLUCOPHAGE) 500 MG tablet Take 1,000 mg by mouth 2 (two) times daily with a meal.    . Multiple Vitamins-Minerals (WOMENS MULTI GUMMIES) CHEW Chew 1 tablet by mouth daily.    Marland Kitchen torsemide 40 MG TABS Take 40 mg by mouth daily. 30 tablet 1  . Vitamin D, Ergocalciferol, (DRISDOL) 1.25 MG (50000 UT) CAPS capsule Take 50,000 Units by mouth every 30 (thirty) days.  11  . voxelotor (OXBRYTA) 500 MG TABS tablet Take 1,500 mg by mouth daily.    . Colchicine 0.6 MG CAPS Take 1 capsule by mouth every other day. Hold until seen by your primary care physician and you have repeat labs. (Patient not taking: Reported on 06/25/2020)    . HYDROmorphone (DILAUDID) 2 MG tablet Take 2 mg by mouth every 4 (four) hours as needed for severe pain or moderate pain. (Patient not taking: Reported on 06/25/2020)     No current facility-administered medications for this encounter.    Vitals:   06/25/20 1132  BP: 94/60  Weight: 52.2 kg   Wt Readings from Last 3 Encounters:  06/25/20 52.2 kg  06/11/20 56.6 kg  12/14/19 62.6 kg   PHYSICAL EXAM: General:  Walked in the clinic.  No resp difficulty HEENT: normal Neck: supple. no JVD. Carotids 2+ bilat; no bruits. No lymphadenopathy or thryomegaly appreciated. Cor: PMI nondisplaced. Regular rate & rhythm. No rubs, gallops. 2/6 TR. Lungs: clear Abdomen: soft,  nontender, nondistended. No hepatosplenomegaly. No bruits or masses. Good bowel sounds. Extremities: no cyanosis, clubbing, rash, edema Neuro: alert & orientedx3, cranial nerves grossly intact. moves all 4 extremities w/o difficulty. Affect pleasant  EKG: SR 1st degree heart block PR 240 ms   ASSESSMENT & PLAN: 1. Chronic systolic/ diastolic HF/RV failure -- She has moderate PAH with markedly elevated RA pressures in setting of severe TR.  - Echo 45-50%, RV severely reduced, RVSP 34 - RHC 05/2020 w/ PCWP 16, preserved CO, low PAPi 0.9  - Milrinone added 8/18 for RV support, Stopped 3/20 for SVT and hypotension - TEE 3/22 showed EF 30-35% and moderate to severe RV dysfunction - Not candidate for restarting inotrope support due to severe SVT w/ profound hypotension  - BB stopped last admit due to low output. Could consider again in a few months and stop amiodarone.  - No ARNI/ARB/spironolactone for now with elevated creatinine and recent hyperkalemia.  - Volume status low. Weight has gone down since discharge. Cut back torsemide to 20 mg daily - I reviewed BMET from PCP - [facility]. Creatinine was 2.1   2. 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 CKD Stage III Creatinine baseline 1.6-1.8  4/5 Creatinine 2.2   4.. Sickle cell anemia 5. Severe OSA - Continue CPAP   6. H/O Bacteremia   Blood CX3/16 + for Staph capitus--> completed antibiotic 3/30 - TEE negative for endocarditis.   6. SVT  -Recent admit had SVT.  - Did not respond to adenosine.  - Started on amio drip--> transitioned to amio 200 mg daily.   EKG today SR with 1st degree heart block. Cut back amiodarone to 100 mg daily.  She is followed at [facility] --PCP, Cardiology, and Pulmonology. She has appointments set up. We will be happy to follow up with her if she would like.   Follow up in 4 months with Dr Haroldine Laws.   Pamela Hudson  Pamela Wann NP-C  11:35 AM

## 2020-06-25 NOTE — Patient Instructions (Addendum)
DECREASE Torsemide to 20 mg daily DECREASE Amiodarone 100 mg one tab daily   Your physician recommends that you schedule a follow-up appointment in: 4 months with Dr Gala Romney  Do the following things EVERYDAY: 1) Weigh yourself in the morning before breakfast. Write it down and keep it in a log. 2) Take your medicines as prescribed 3) Eat low salt foods--Limit salt (sodium) to 2000 mg per day.  4) Stay as active as you can everyday 5) Limit all fluids for the day to less than 2 liters At the [facility], you and your health needs are our priority. As part of our continuing mission to provide you with exceptional heart care, we have created designated Provider Care Teams. These Care Teams include your primary Cardiologist (physician) and Advanced Practice Providers (APPs- Physician Assistants and Nurse Practitioners) who all work together to provide you with the care you need, when you need it.   You may see any of the following providers on your designated Care Team at your next follow up: Marland Kitchen Dr Arvilla Meres . Dr Marca Ancona . Dr Thornell Mule . Tonye Becket, NP . Robbie Lis, PA . Shanda Bumps [place],NP . Karle Plumber, PharmD   Please be sure to bring in all your medications bottles to every appointment.   If you have any questions or concerns before your next appointment please send [place] a message through [address] or call our office at [phone].    TO LEAVE A MESSAGE FOR THE NURSE SELECT OPTION 2, PLEASE LEAVE A MESSAGE INCLUDING: . YOUR NAME . DATE OF BIRTH . CALL BACK NUMBER . REASON FOR CALL**this is important as we prioritize the call backs  YOU WILL RECEIVE A CALL BACK THE SAME DAY AS LONG AS YOU CALL BEFORE 4:00 PM  Please see our updated No Show and Same Day Appointment Cancellation Policy attached to your AVS.

## 2020-10-29 ENCOUNTER — Encounter (HOSPITAL_COMMUNITY): Payer: Medicaid Other | Admitting: Internal Medicine

## 2021-06-13 DEATH — deceased

## 2021-08-04 IMAGING — US US ABDOMEN COMPLETE
1 series · 14 of 25 positions shown · non-contrast
Comparison: 04/17/2020

CLINICAL DATA: Elevated liver function tests, chronic renal
insufficiency

EXAM:
ABDOMEN ULTRASOUND COMPLETE

[Series 1: us abdomen complete · 14 of 59 slices shown]
[im 1/59]
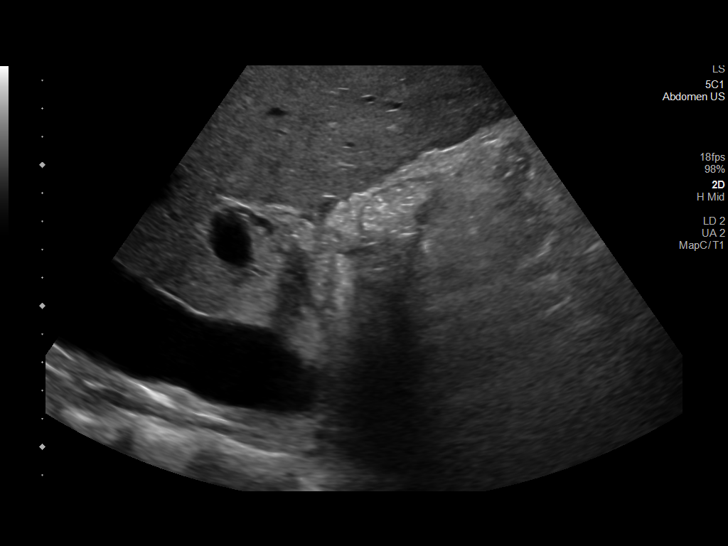
[im 5/59]
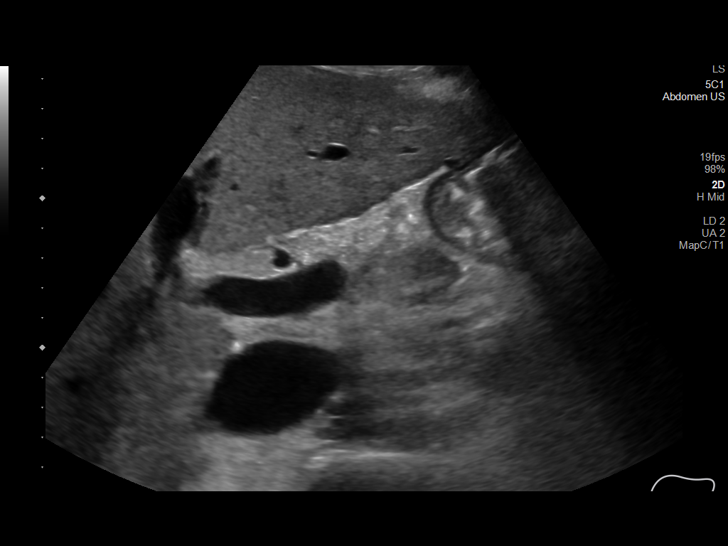
[im 10/59]
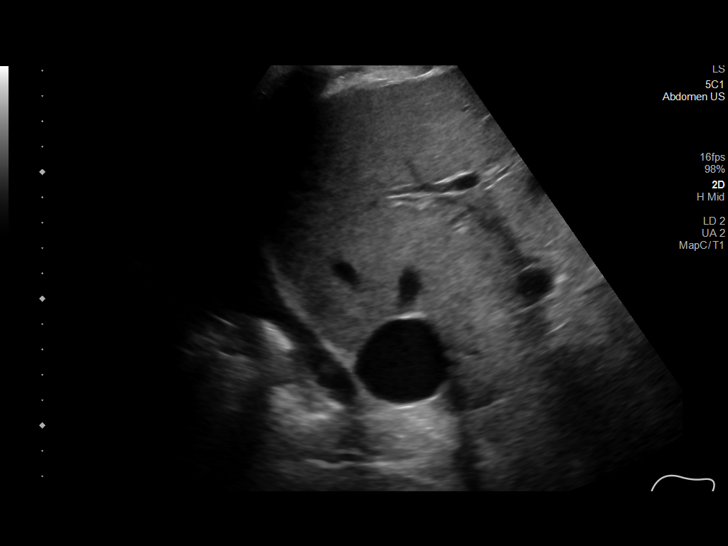
[im 15/59]
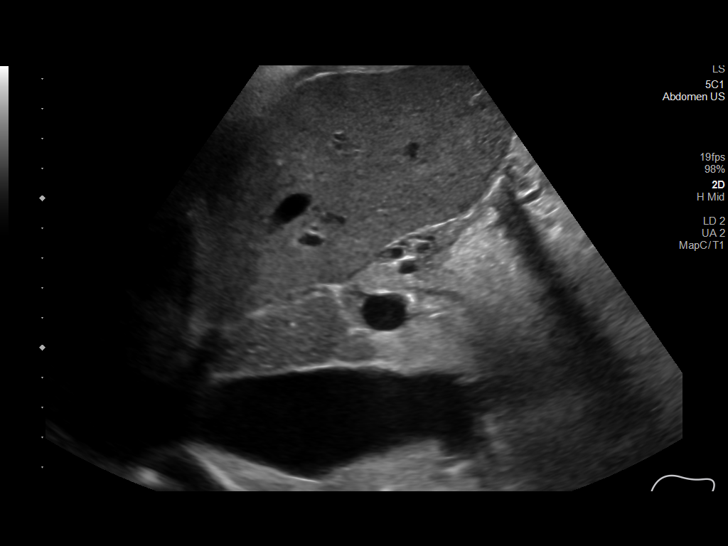
[im 20/59]
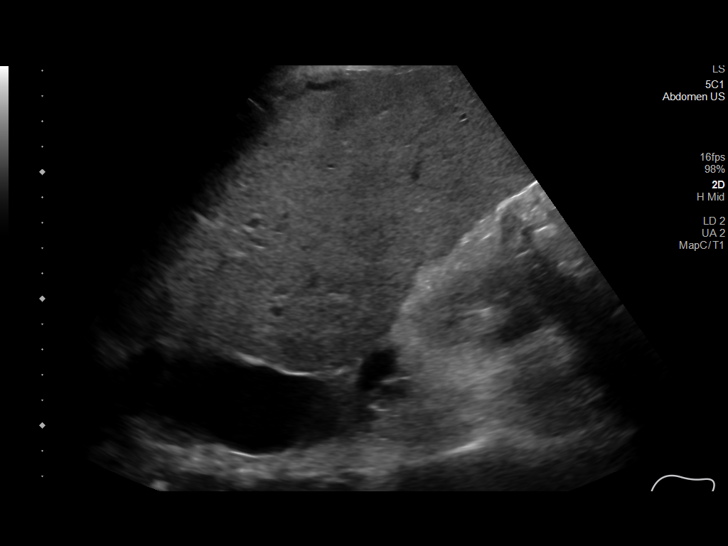
[im 22/59]
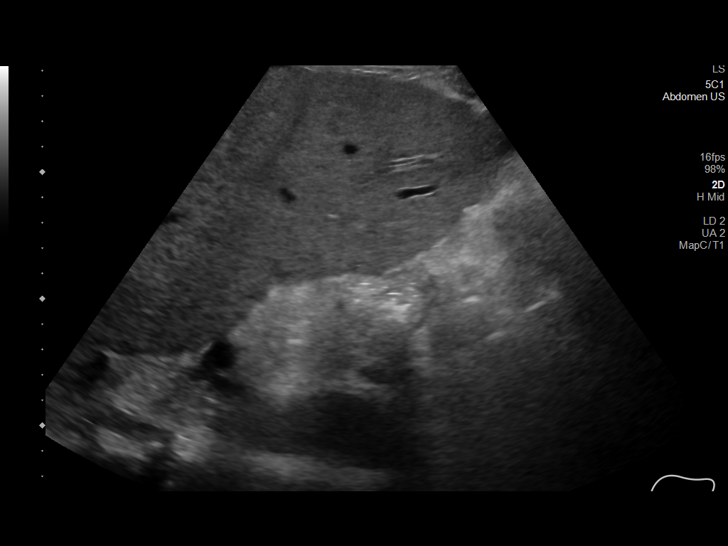
[im 27/59]
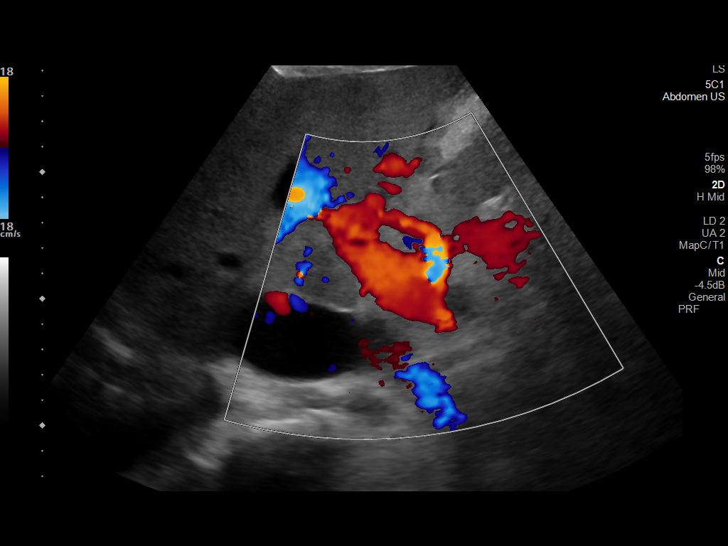
[im 32/59]
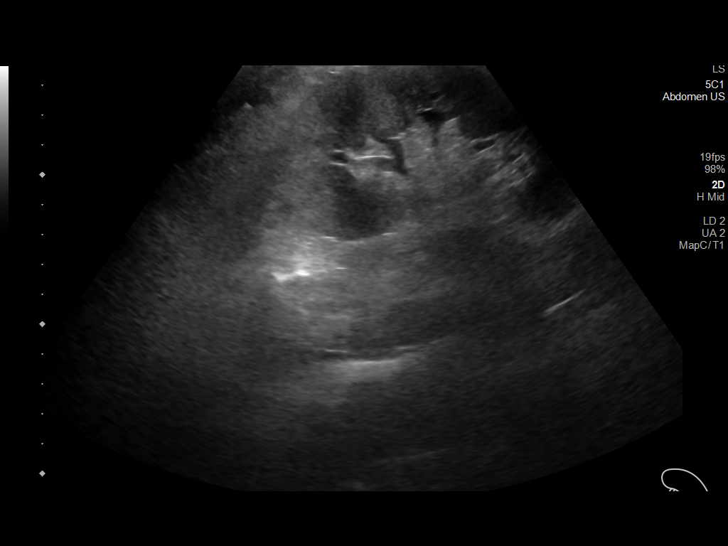
[im 37/59]
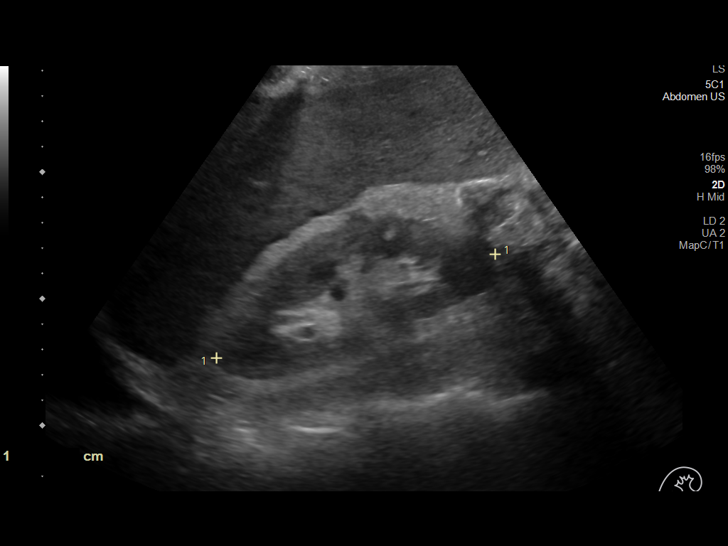
[im 39/59]
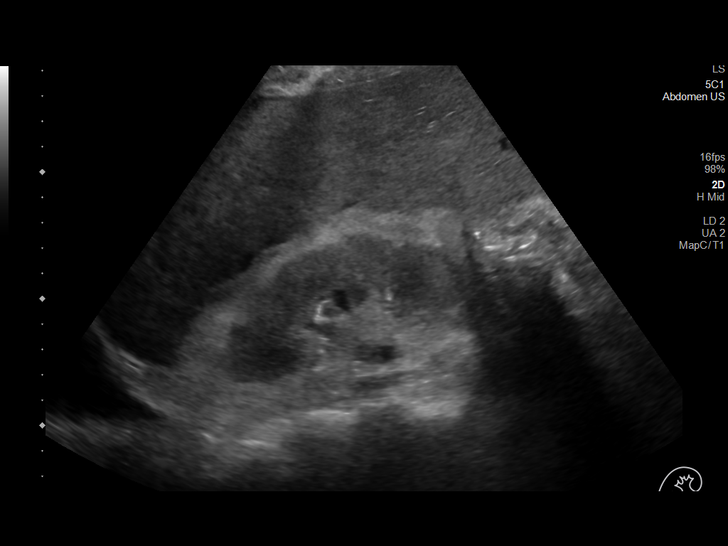
[im 44/59]
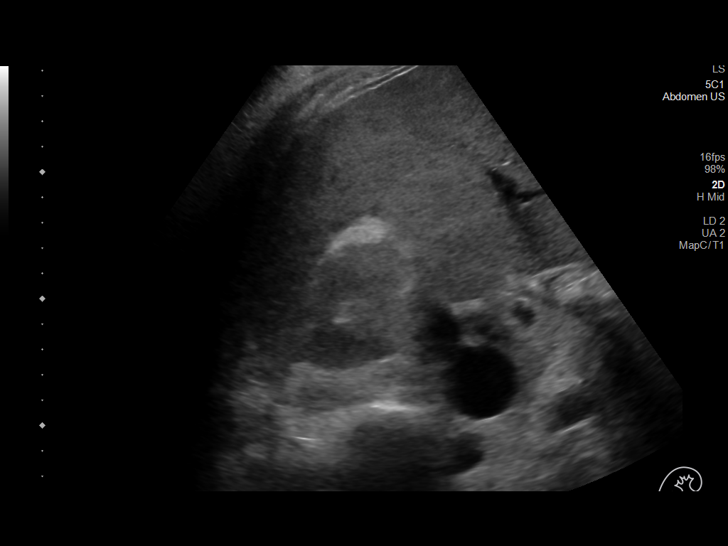
[im 49/59]
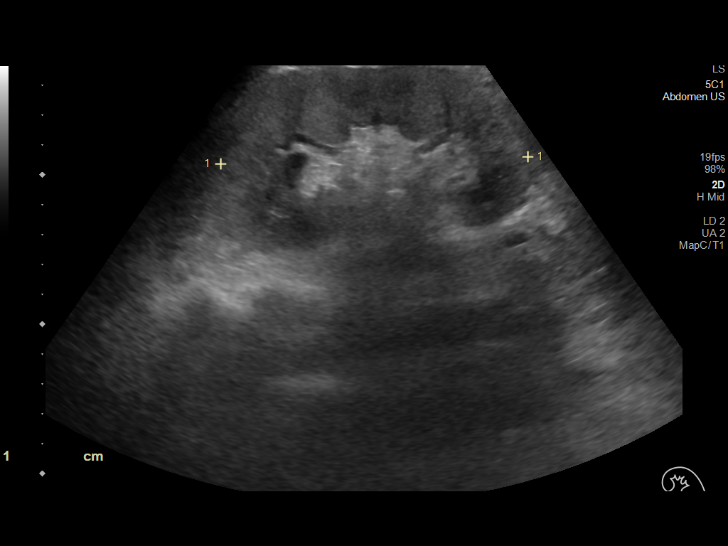
[im 54/59]
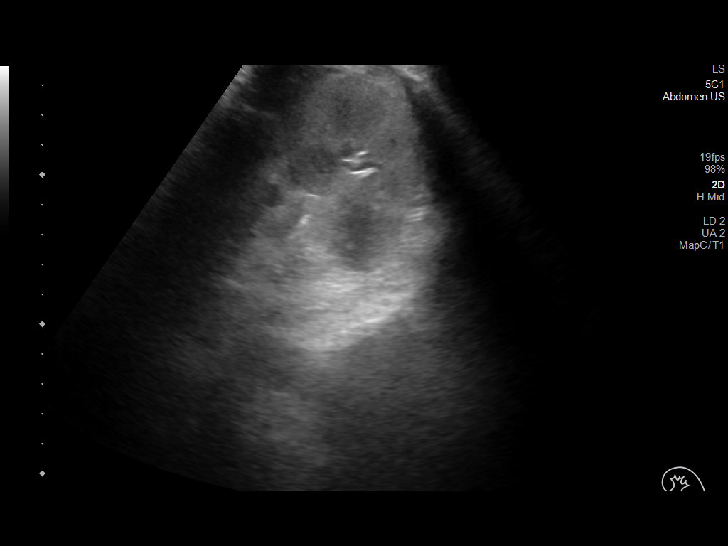
[im 59/59]
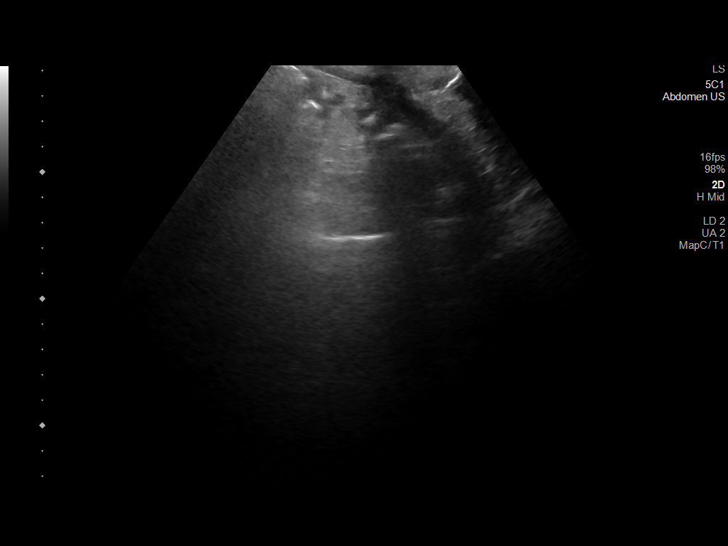

[14 of 25 positions shown; findings below may reference images not displayed]

FINDINGS: Gallbladder: Surgically absent

Common bile duct: Diameter: 4 mm

Liver: Stable heterogeneous increased liver echotexture most
consistent with hepatic steatosis. No focal liver abnormality. No
intrahepatic duct dilation. Portal vein is patent on color Doppler
imaging with normal direction of blood flow towards the liver.

IVC: No abnormality visualized.

Pancreas: Visualized portion unremarkable.

Spleen: Suboptimally visualized due to splenic atrophy noted on
previous CT exams.

Right Kidney: Length: 11.7 cm. Echogenicity within normal limits. No
mass or hydronephrosis visualized.

Left Kidney: Length: 10.3 cm. Echogenicity within normal limits. No
mass or hydronephrosis visualized.

Abdominal aorta: No aneurysm visualized.

Other findings: None.
IMPRESSION: 1. Increased liver echotexture compatible with hepatic steatosis.
2. Status post cholecystectomy.
3. Suboptimal visualization of the spleen, compatible with splenic
atrophy noted on previous CT exams.

## 2021-08-09 IMAGING — DX DG FOOT 2V*L*
2 series · 2 of 2 positions shown · non-contrast
Comparison: August 20, 2019

CLINICAL DATA: Pain

EXAM:
LEFT FOOT - 2 VIEW

[foot ap]
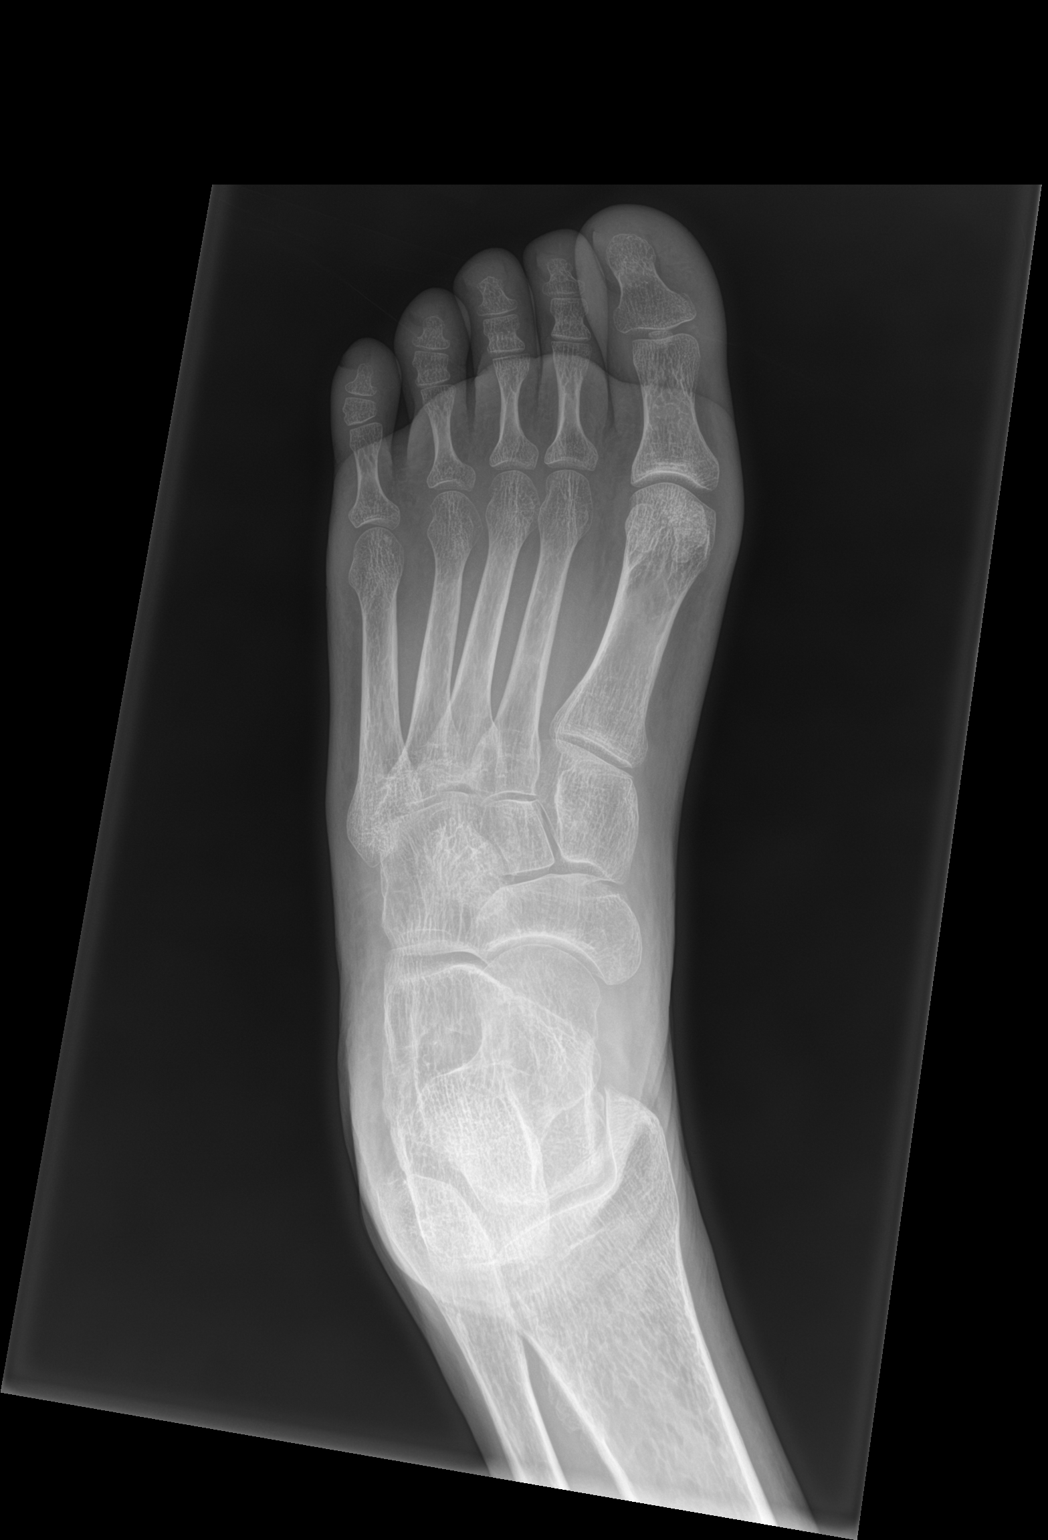

[foot lat]
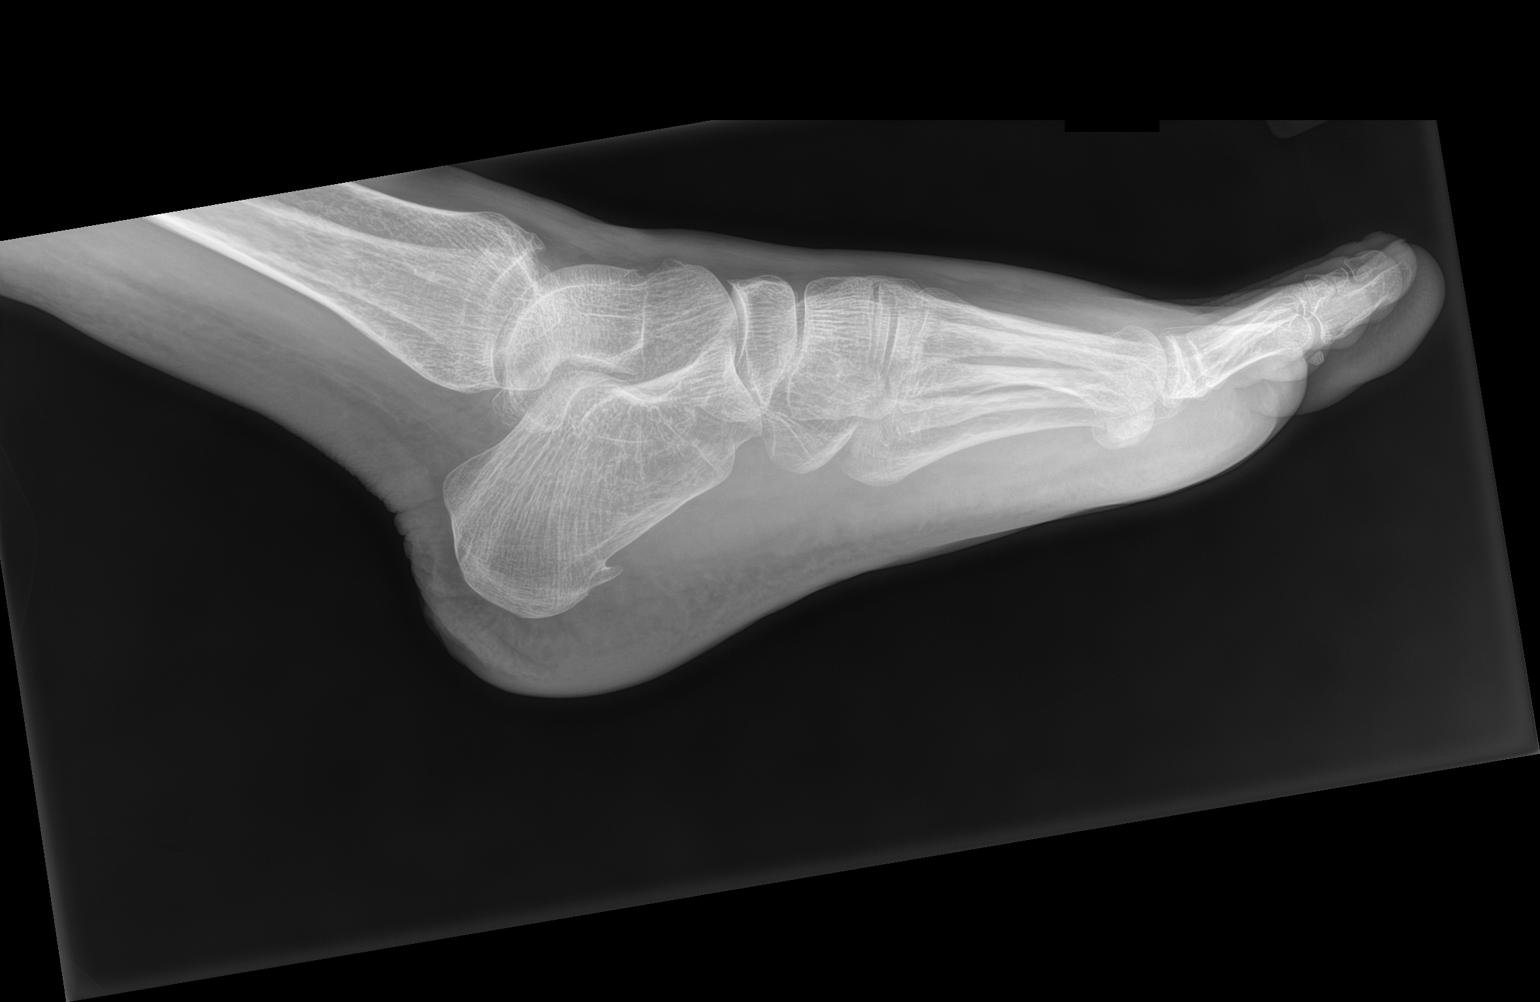

[2 of 2 positions shown; findings below may reference images not displayed]

FINDINGS: Frontal and lateral views were obtained. No fracture or dislocation.
Joint spaces appear normal. No erosive change. There is a small
inferior calcaneal spur.
IMPRESSION: No fracture or dislocation. No evident arthropathy. There is a small
inferior calcaneal spur.

## 2021-08-15 IMAGING — DX DG FEMUR 2+V*R*
4 series · 4 of 4 positions shown · non-contrast
Comparison: None

CLINICAL DATA: RIGHT upper leg pain for 2 days, no known injury

EXAM:
RIGHT FEMUR 2 VIEWS

[femur ap (1 of 2)]
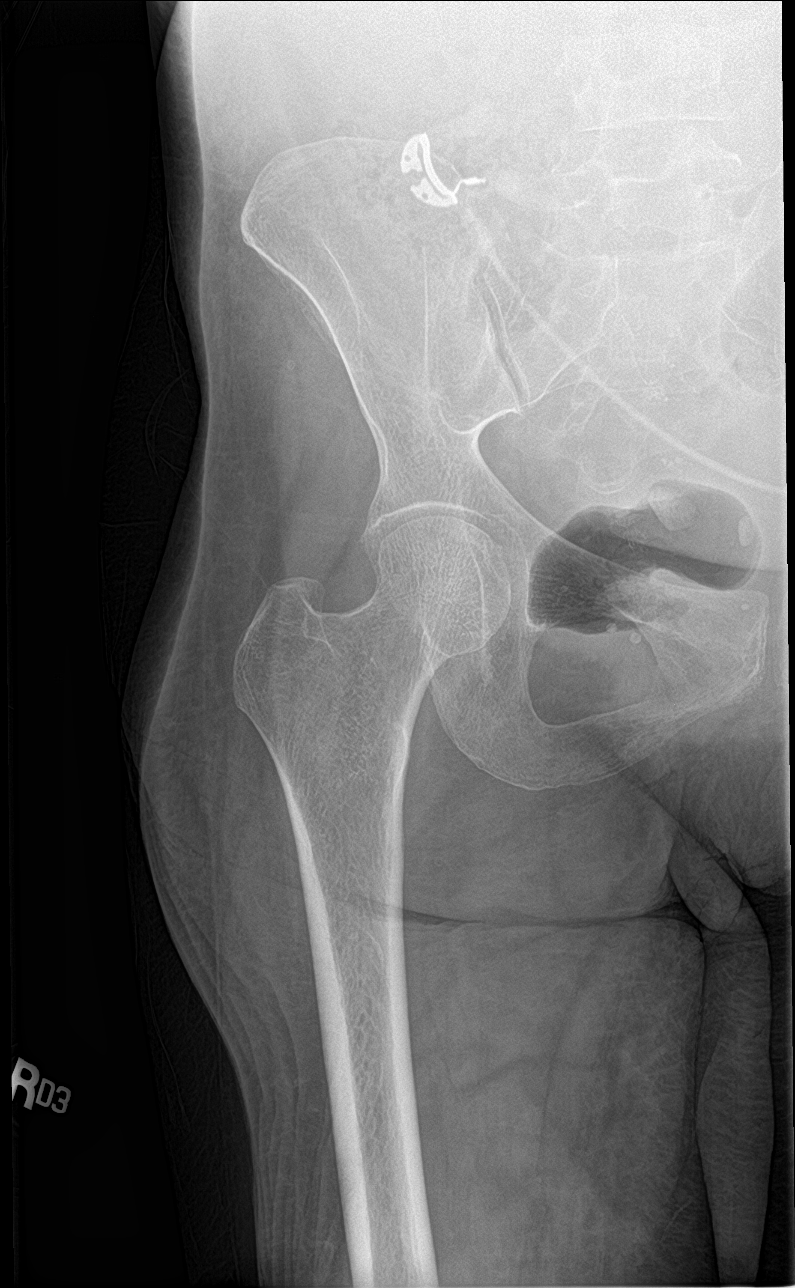

[femur ap (2 of 2)]
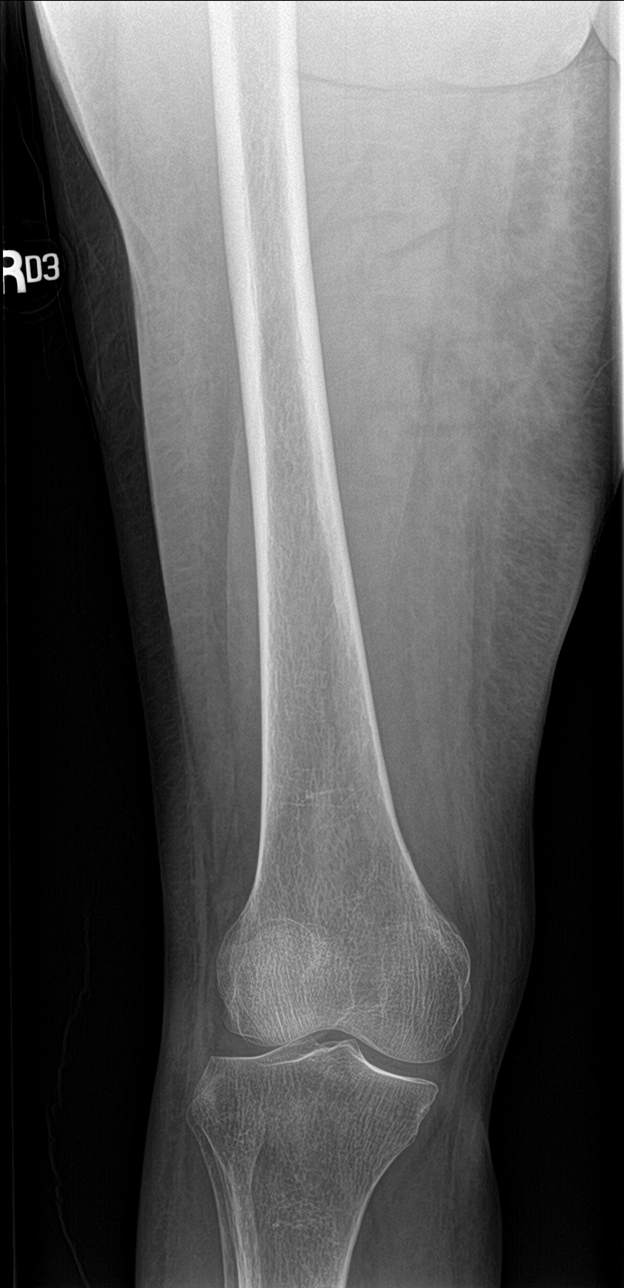

[femur lat (1 of 2)]
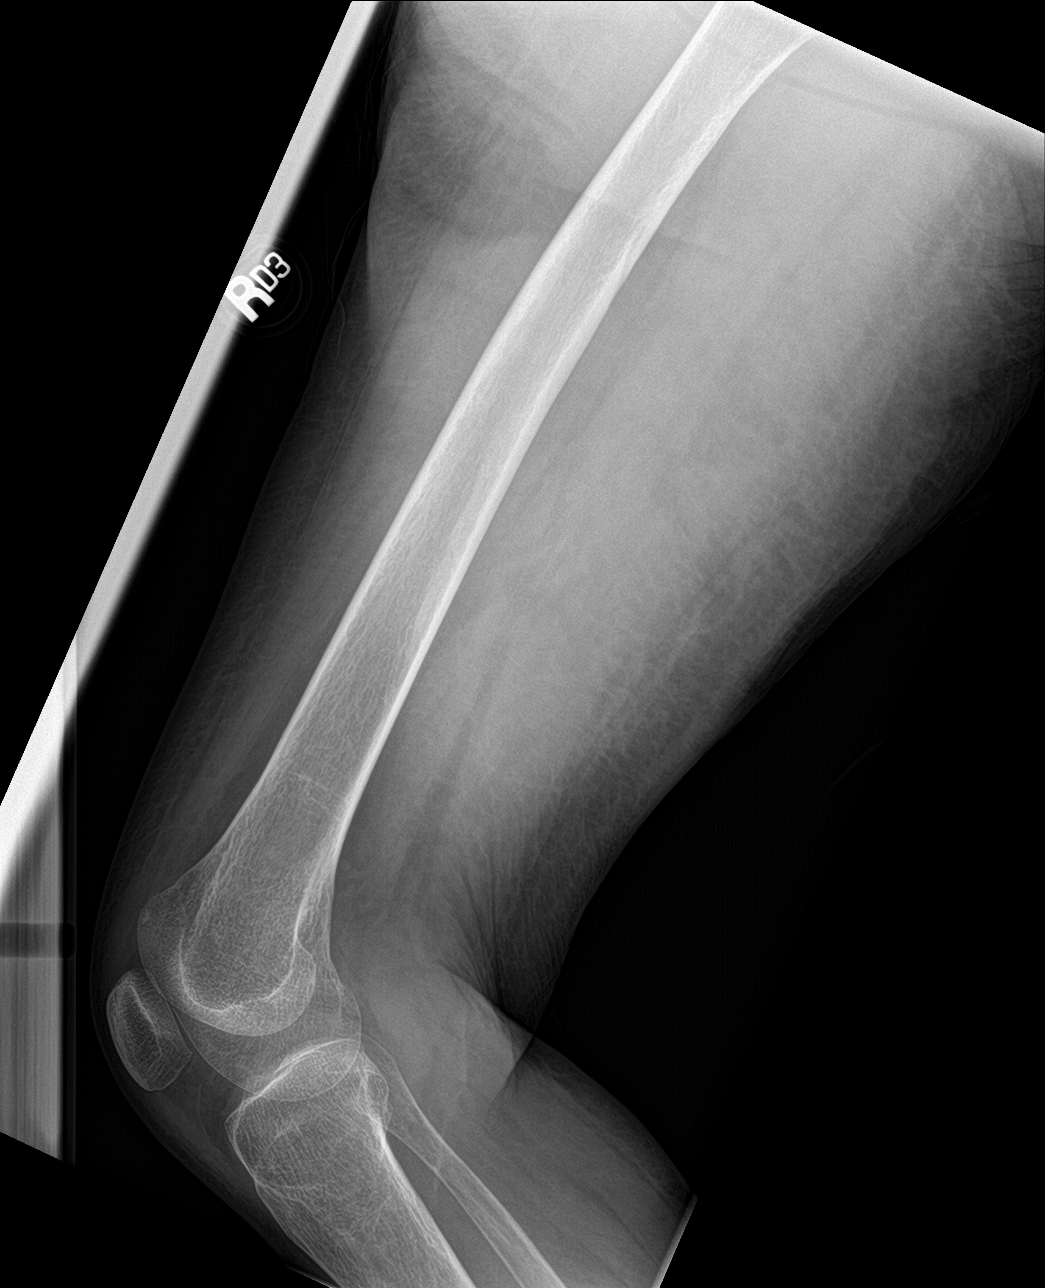

[femur lat (2 of 2)]
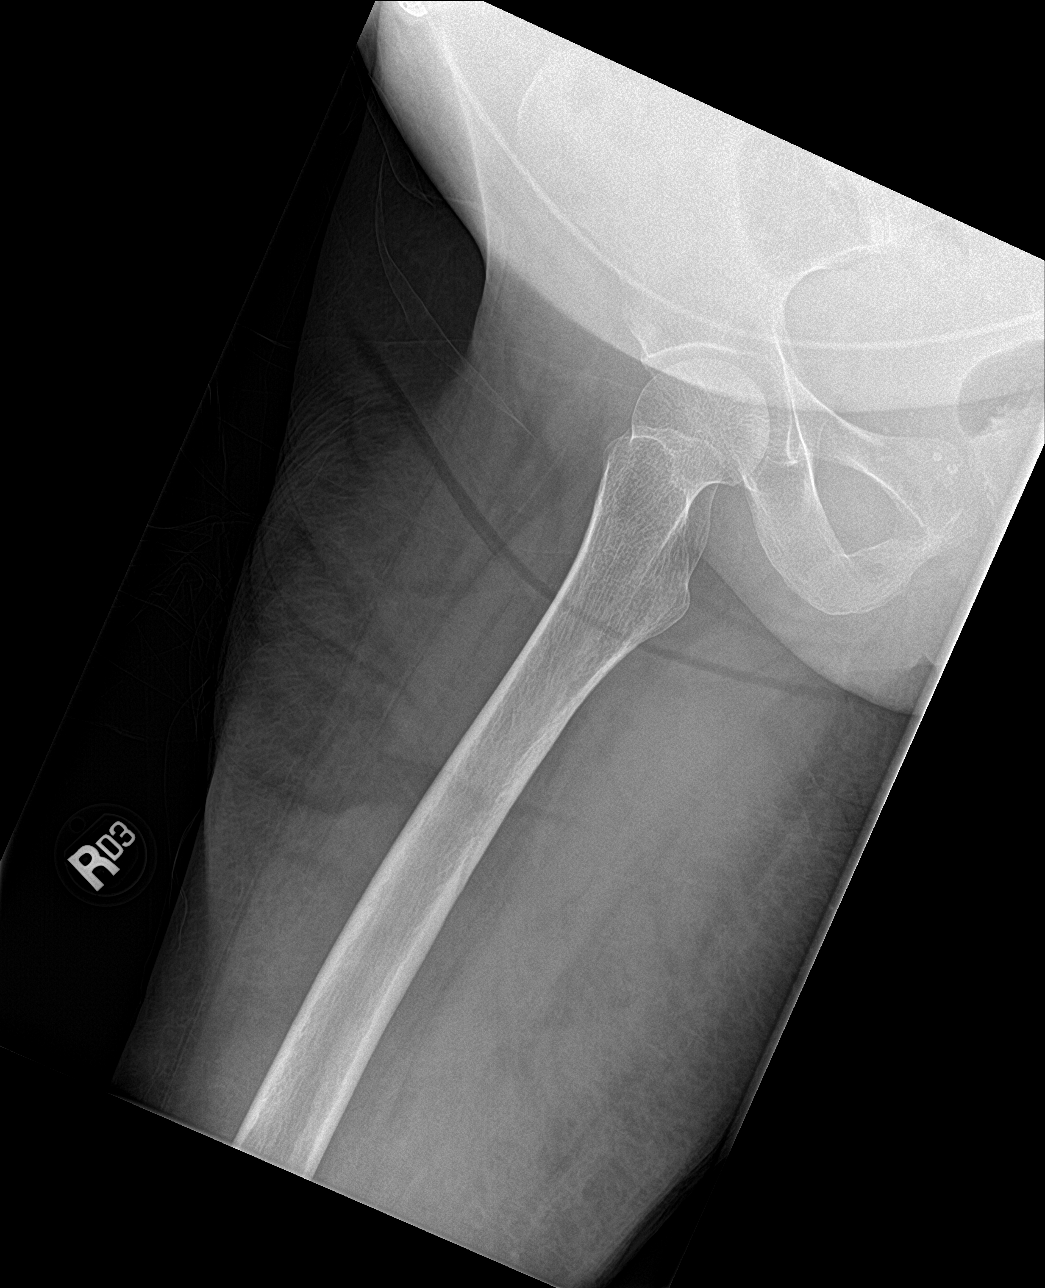

[4 of 4 positions shown; findings below may reference images not displayed]

FINDINGS: Osseous demineralization.

Joint spaces preserved.

No acute fracture, dislocation, or bone destruction.

Few scattered pelvic phleboliths.
IMPRESSION: Osseous demineralization without acute abnormalities.
